# Patient Record
Sex: Female | Born: 1970
Health system: Southern US, Community
[De-identification: ages and names within clinical notes are randomized; demographics above are authoritative.]

## PROBLEM LIST (undated history)

## (undated) DIAGNOSIS — N182 Chronic kidney disease, stage 2 (mild): Secondary | ICD-10-CM

## (undated) DIAGNOSIS — I2699 Other pulmonary embolism without acute cor pulmonale: Secondary | ICD-10-CM

## (undated) DIAGNOSIS — M549 Dorsalgia, unspecified: Secondary | ICD-10-CM

## (undated) DIAGNOSIS — G8929 Other chronic pain: Secondary | ICD-10-CM

## (undated) DIAGNOSIS — M48061 Spinal stenosis, lumbar region without neurogenic claudication: Secondary | ICD-10-CM

## (undated) DIAGNOSIS — I82409 Acute embolism and thrombosis of unspecified deep veins of unspecified lower extremity: Secondary | ICD-10-CM

## (undated) DIAGNOSIS — I10 Essential (primary) hypertension: Secondary | ICD-10-CM

## (undated) HISTORY — PX: CARPAL TUNNEL RELEASE: SHX101

## (undated) HISTORY — PX: ABDOMINAL SURGERY: SHX537

---

## 2016-01-04 ENCOUNTER — Encounter (HOSPITAL_BASED_OUTPATIENT_CLINIC_OR_DEPARTMENT_OTHER): Payer: Self-pay | Admitting: Emergency Medicine

## 2016-01-04 ENCOUNTER — Inpatient Hospital Stay (HOSPITAL_BASED_OUTPATIENT_CLINIC_OR_DEPARTMENT_OTHER)
Admission: EM | Admit: 2016-01-04 | Discharge: 2016-01-12 | DRG: 460 | Disposition: A | Discharge status: Rehabilitation Facility | Payer: 59 | Attending: Internal Medicine | Admitting: Internal Medicine

## 2016-01-04 DIAGNOSIS — Z6841 Body Mass Index (BMI) 40.0 and over, adult: Secondary | ICD-10-CM

## 2016-01-04 DIAGNOSIS — M5416 Radiculopathy, lumbar region: Secondary | ICD-10-CM | POA: Diagnosis present

## 2016-01-04 DIAGNOSIS — M7138 Other bursal cyst, other site: Secondary | ICD-10-CM | POA: Diagnosis present

## 2016-01-04 DIAGNOSIS — M4806 Spinal stenosis, lumbar region: Secondary | ICD-10-CM | POA: Diagnosis not present

## 2016-01-04 DIAGNOSIS — M545 Low back pain, unspecified: Secondary | ICD-10-CM | POA: Insufficient documentation

## 2016-01-04 DIAGNOSIS — M5442 Lumbago with sciatica, left side: Secondary | ICD-10-CM

## 2016-01-04 DIAGNOSIS — Z419 Encounter for procedure for purposes other than remedying health state, unspecified: Secondary | ICD-10-CM

## 2016-01-04 DIAGNOSIS — I10 Essential (primary) hypertension: Secondary | ICD-10-CM

## 2016-01-04 DIAGNOSIS — M549 Dorsalgia, unspecified: Secondary | ICD-10-CM | POA: Diagnosis present

## 2016-01-04 DIAGNOSIS — M5441 Lumbago with sciatica, right side: Secondary | ICD-10-CM

## 2016-01-04 DIAGNOSIS — G8918 Other acute postprocedural pain: Secondary | ICD-10-CM | POA: Insufficient documentation

## 2016-01-04 DIAGNOSIS — M48061 Spinal stenosis, lumbar region without neurogenic claudication: Secondary | ICD-10-CM | POA: Insufficient documentation

## 2016-01-04 DIAGNOSIS — N182 Chronic kidney disease, stage 2 (mild): Secondary | ICD-10-CM | POA: Diagnosis present

## 2016-01-04 DIAGNOSIS — K5903 Drug induced constipation: Secondary | ICD-10-CM | POA: Insufficient documentation

## 2016-01-04 DIAGNOSIS — R739 Hyperglycemia, unspecified: Secondary | ICD-10-CM | POA: Diagnosis present

## 2016-01-04 DIAGNOSIS — N179 Acute kidney failure, unspecified: Secondary | ICD-10-CM | POA: Diagnosis not present

## 2016-01-04 DIAGNOSIS — M21371 Foot drop, right foot: Secondary | ICD-10-CM | POA: Diagnosis present

## 2016-01-04 DIAGNOSIS — G8929 Other chronic pain: Secondary | ICD-10-CM | POA: Diagnosis present

## 2016-01-04 DIAGNOSIS — D649 Anemia, unspecified: Secondary | ICD-10-CM | POA: Insufficient documentation

## 2016-01-04 DIAGNOSIS — Z9181 History of falling: Secondary | ICD-10-CM

## 2016-01-04 DIAGNOSIS — Z79899 Other long term (current) drug therapy: Secondary | ICD-10-CM

## 2016-01-04 DIAGNOSIS — N289 Disorder of kidney and ureter, unspecified: Secondary | ICD-10-CM

## 2016-01-04 DIAGNOSIS — M79606 Pain in leg, unspecified: Secondary | ICD-10-CM

## 2016-01-04 DIAGNOSIS — I129 Hypertensive chronic kidney disease with stage 1 through stage 4 chronic kidney disease, or unspecified chronic kidney disease: Secondary | ICD-10-CM | POA: Diagnosis present

## 2016-01-04 DIAGNOSIS — R29898 Other symptoms and signs involving the musculoskeletal system: Secondary | ICD-10-CM

## 2016-01-04 DIAGNOSIS — N390 Urinary tract infection, site not specified: Secondary | ICD-10-CM

## 2016-01-04 DIAGNOSIS — Z981 Arthrodesis status: Secondary | ICD-10-CM | POA: Insufficient documentation

## 2016-01-04 HISTORY — DX: Essential (primary) hypertension: I10

## 2016-01-04 HISTORY — DX: Other chronic pain: G89.29

## 2016-01-04 HISTORY — DX: Dorsalgia, unspecified: M54.9

## 2016-01-04 LAB — CBC WITH DIFFERENTIAL/PLATELET
BASOS PCT: 0 %
Basophils Absolute: 0 10*3/uL (ref 0.0–0.1)
EOS PCT: 1 %
Eosinophils Absolute: 0.1 10*3/uL (ref 0.0–0.7)
HEMATOCRIT: 38.2 % (ref 36.0–46.0)
Hemoglobin: 12.9 g/dL (ref 12.0–15.0)
LYMPHS ABS: 0.9 10*3/uL (ref 0.7–4.0)
Lymphocytes Relative: 6 %
MCH: 30.4 pg (ref 26.0–34.0)
MCHC: 33.8 g/dL (ref 30.0–36.0)
MCV: 89.9 fL (ref 78.0–100.0)
MONOS PCT: 7 %
Monocytes Absolute: 1 10*3/uL (ref 0.1–1.0)
NEUTROS ABS: 12.3 10*3/uL — AB (ref 1.7–7.7)
Neutrophils Relative %: 86 %
Platelets: 155 10*3/uL (ref 150–400)
RBC: 4.25 MIL/uL (ref 3.87–5.11)
RDW: 13.6 % (ref 11.5–15.5)
WBC: 14.3 10*3/uL — AB (ref 4.0–10.5)

## 2016-01-04 LAB — BASIC METABOLIC PANEL
Anion gap: 9 (ref 5–15)
BUN: 18 mg/dL (ref 6–20)
CHLORIDE: 106 mmol/L (ref 101–111)
CO2: 24 mmol/L (ref 22–32)
Calcium: 8.7 mg/dL — ABNORMAL LOW (ref 8.9–10.3)
Creatinine, Ser: 1.17 mg/dL — ABNORMAL HIGH (ref 0.44–1.00)
GFR calc Af Amer: >60 mL/min (ref >60)
GFR calc non Af Amer: 56 mL/min — ABNORMAL LOW (ref >60)
GLUCOSE: 104 mg/dL — AB (ref 65–99)
POTASSIUM: 4.8 mmol/L (ref 3.5–5.1)
Sodium: 139 mmol/L (ref 135–145)

## 2016-01-04 LAB — PREGNANCY, URINE: Preg Test, Ur: NEGATIVE

## 2016-01-04 MED ORDER — HYDROMORPHONE HCL 1 MG/ML IJ SOLN
1.0000 mg | Freq: Once | INTRAMUSCULAR | Status: AC
  Administered 2016-01-04: 1 mg via INTRAVENOUS
  Filled 2016-01-04: qty 1

## 2016-01-04 NOTE — ED Notes (Signed)
PTAR here for pt, pt denies questions or needs.

## 2016-01-04 NOTE — ED Notes (Signed)
Patient reports that she is having lower back pain that is chronic. Patient reports that sje feels like she cant do anything including ADL's

## 2016-01-04 NOTE — ED Notes (Signed)
Pt up to b/r by w/c, family and EMT present.

## 2016-01-04 NOTE — ED Notes (Signed)
C/o chronic back pain, pain has moved into bilateral buttocks equally, mentions cyst on spine, has appt with [facility] nsurg/neuro for injection in July, has finished prednisone, still taking neurontin, flexeril and norco w/o relief, also reports weakness and numbness. Sx ongoing for months. Walks with walker. No unilateral defecits, BLE generally weak, pedal edema noted.

## 2016-01-04 NOTE — ED Provider Notes (Signed)
CSN: [id number]     Arrival date & time 01/04/16  2034 History  By signing my name below, I, Reola Mosher, attest that this documentation has been prepared under the direction and in the presence of Sherwood Gambler, MD.  Electronically Signed: Reola Mosher, ED Scribe. 01/04/2016. 10:36 PM.   Chief Complaint  Patient presents with  . Back Pain   The history is provided by the patient and the spouse. No language interpreter was used.   HPI Comments: Caitlin Taylor is a 45 y.o. female who presents to the Emergency Department complaining of gradual onset, gradually worsening, acute on chronic, constant back pain onset 1 month PTA. Her pain has significantly worsened in the past four days. Pt reports that she had an MRI performed on her back at [facility] where they noted she had a synovial cyst on her spine. Pt reports that she has an appointment with [facility] Neurosurgery in one month to receive an injection for her cyst. She has recently finished courses of Prednisone and Hydrocodone in the past day. She also has been taking Gabapentin. None of her prescribed medications are alleviating her pain. She reports that her pain is worsened with movement and ambulation. Pt also reports that she woke up two days ago and noticed weakness in her right foot that has gradual progressed up her leg and into her left foot. She also states that she has been feeling slight numbness in her right foot and it has also gradually moved into her right leg. She states that she has been dragging both of her legs behind her with ambulation. Her husband notes that she has been falling more recently because of her weakness. Pt typically ambulates with the assistance of a walker. She denies fever.   Past Medical History  Diagnosis Date  . Chronic back pain   . Hypertension    History reviewed. No pertinent past surgical history. History reviewed. No pertinent family history. Social History   Substance Use Topics  . Smoking status: Never Smoker   . Smokeless tobacco: None  . Alcohol Use: No   OB History    No data available     Review of Systems  Constitutional: Negative for fever.  Musculoskeletal: Positive for back pain and gait problem.  Neurological: Positive for weakness and numbness.  All other systems reviewed and are negative.  Allergies  Review of patient's allergies indicates no known allergies.  Home Medications   Prior to Admission medications   Medication Sig Start Date End Date Taking? Authorizing Provider  furosemide (LASIX) 20 MG tablet Take 80 mg by mouth.   Yes Historical Provider, MD  lisinopril-hydrochlorothiazide (PRINZIDE,ZESTORETIC) 20-25 MG tablet Take 1 tablet by mouth daily.   Yes Historical Provider, MD   BP 188/110 mmHg  Pulse 118  Temp(Src) 99 F (37.2 C) (Oral)  Resp 20  Ht 5\' 3"  (1.6 m)  Wt 267 lb (121.11 kg)  BMI 47.31 kg/m2  SpO2 97%  LMP 12/28/2015   Physical Exam  Constitutional: She is oriented to person, place, and time. She appears well-developed and well-nourished.  HENT:  Head: Normocephalic and atraumatic.  Right Ear: External ear normal.  Left Ear: External ear normal.  Nose: Nose normal.  Eyes: Right eye exhibits no discharge. Left eye exhibits no discharge.  Cardiovascular: Normal rate, regular rhythm and normal heart sounds.   Pulmonary/Chest: Effort normal and breath sounds normal.  Abdominal: Soft. There is no tenderness. There is no CVA tenderness.  Genitourinary: Rectal exam shows anal tone normal.  Normal rectal tone and sensation. No gross blood  Musculoskeletal: She exhibits edema (BLE with pitting edema, patient states is chronic).       Lumbar back: She exhibits tenderness (diffuse).  Neurological: She is alert and oriented to person, place, and time.  5/5 strength with straight leg raise bilaterally. Decreased dorsiflexion of both feet, right greater than left. Subjective decreased sensation to  right lower extremity. Pt drags right leg with ambulation.   Skin: Skin is warm and dry.  Nursing note and vitals reviewed.  ED Course  Procedures (including critical care time)  DIAGNOSTIC STUDIES: Oxygen Saturation is 97% on RA, normal by my interpretation.   COORDINATION OF CARE: 10:27 PM-Discussed next steps with pt including pain medications, and transfer to [facility] for MRI Lumbar Spine. Pt verbalized understanding and is agreeable with the plan.   Labs Review Labs Reviewed - No data to display  Imaging Review MRI Lumbar Spine w/o Contrast 12/26/15 IMPRESSION: Severe bilateral facet arthritis at L4-5 with facet joint synovial cyst on the right and left with mass effects on the thecal sac which could affect the nerves within the thecal sac.   Electronically Signed By: Lorriane Shire M.D. On: 12/26/2015 15:15    EKG Interpretation None      MDM   Final diagnoses:  Bilateral low back pain with sciatica, sciatica laterality unspecified  Right leg weakness    Patient has worsening back pain with more more trouble walking. I observed patient tried to ambulate and she can only swing out her right leg due to weakness. She also has a foot drop with subjective decreased sensation in her right lower extremity. Normal rectal tone and no reports of incontinence but I'm concerned for worsening lumbar disease. I think she needs an MRI tonight. We do not have this at this facility. MRI from 10 days ago shows cyst with impingement on her thecal sac. Discussed with EDP at [facility], Dr. Sabra Heck, who accepts in transfer. Patient to be given pain control here. Will evaluate post void residual to see if she is retaining urine. Patient states she does not want to be transferred to [facility] and prefers [place].  I personally performed the services described in this documentation, which was scribed in my presence. The recorded information has been reviewed and is accurate.    Sherwood Gambler, MD 01/04/16 [phone]

## 2016-01-05 ENCOUNTER — Other Ambulatory Visit: Payer: Self-pay

## 2016-01-05 ENCOUNTER — Encounter (HOSPITAL_COMMUNITY): Payer: Self-pay | Admitting: Internal Medicine

## 2016-01-05 ENCOUNTER — Observation Stay (HOSPITAL_COMMUNITY): Payer: 59

## 2016-01-05 ENCOUNTER — Emergency Department (HOSPITAL_COMMUNITY): Payer: 59

## 2016-01-05 DIAGNOSIS — N390 Urinary tract infection, site not specified: Secondary | ICD-10-CM | POA: Diagnosis not present

## 2016-01-05 DIAGNOSIS — M545 Low back pain: Secondary | ICD-10-CM | POA: Diagnosis not present

## 2016-01-05 DIAGNOSIS — N289 Disorder of kidney and ureter, unspecified: Secondary | ICD-10-CM | POA: Diagnosis not present

## 2016-01-05 DIAGNOSIS — R739 Hyperglycemia, unspecified: Secondary | ICD-10-CM

## 2016-01-05 DIAGNOSIS — M5416 Radiculopathy, lumbar region: Secondary | ICD-10-CM

## 2016-01-05 DIAGNOSIS — I10 Essential (primary) hypertension: Secondary | ICD-10-CM | POA: Diagnosis not present

## 2016-01-05 DIAGNOSIS — N182 Chronic kidney disease, stage 2 (mild): Secondary | ICD-10-CM

## 2016-01-05 DIAGNOSIS — M549 Dorsalgia, unspecified: Secondary | ICD-10-CM | POA: Diagnosis present

## 2016-01-05 DIAGNOSIS — R319 Hematuria, unspecified: Secondary | ICD-10-CM

## 2016-01-05 LAB — MRSA PCR SCREENING: MRSA by PCR: NEGATIVE

## 2016-01-05 LAB — TYPE AND SCREEN
ABO/RH(D): O POS
Antibody Screen: NEGATIVE

## 2016-01-05 LAB — CBC
HCT: 36.3 % (ref 36.0–46.0)
HEMOGLOBIN: 11.7 g/dL — AB (ref 12.0–15.0)
MCH: 29 pg (ref 26.0–34.0)
MCHC: 32.2 g/dL (ref 30.0–36.0)
MCV: 90.1 fL (ref 78.0–100.0)
PLATELETS: 131 10*3/uL — AB (ref 150–400)
RBC: 4.03 MIL/uL (ref 3.87–5.11)
RDW: 13.8 % (ref 11.5–15.5)
WBC: 11.3 10*3/uL — ABNORMAL HIGH (ref 4.0–10.5)

## 2016-01-05 LAB — COMPREHENSIVE METABOLIC PANEL
ALT: 59 U/L — AB (ref 14–54)
ANION GAP: 4 — AB (ref 5–15)
AST: 26 U/L (ref 15–41)
Albumin: 3.5 g/dL (ref 3.5–5.0)
Alkaline Phosphatase: 50 U/L (ref 38–126)
BUN: 13 mg/dL (ref 6–20)
CALCIUM: 8.6 mg/dL — AB (ref 8.9–10.3)
CHLORIDE: 108 mmol/L (ref 101–111)
CO2: 27 mmol/L (ref 22–32)
CREATININE: 1.27 mg/dL — AB (ref 0.44–1.00)
GFR, EST AFRICAN AMERICAN: 59 mL/min — AB (ref >60)
GFR, EST NON AFRICAN AMERICAN: 51 mL/min — AB (ref >60)
Glucose, Bld: 124 mg/dL — ABNORMAL HIGH (ref 65–99)
POTASSIUM: 4.6 mmol/L (ref 3.5–5.1)
Sodium: 139 mmol/L (ref 135–145)
TOTAL PROTEIN: 6.3 g/dL — AB (ref 6.5–8.1)
Total Bilirubin: 0.8 mg/dL (ref 0.3–1.2)

## 2016-01-05 LAB — ABO/RH: ABO/RH(D): O POS

## 2016-01-05 MED ORDER — GABAPENTIN 300 MG PO CAPS
300.0000 mg | ORAL_CAPSULE | Freq: Every day | ORAL | Status: DC
  Administered 2016-01-05: 300 mg via ORAL
  Filled 2016-01-05: qty 1

## 2016-01-05 MED ORDER — ACETAMINOPHEN 325 MG PO TABS: 650.0000 mg | ORAL_TABLET | Freq: Four times a day (QID) | ORAL | Status: DC | PRN

## 2016-01-05 MED ORDER — ENOXAPARIN SODIUM 60 MG/0.6ML ~~LOC~~ SOLN
0.5000 mg/kg | SUBCUTANEOUS | Status: DC
  Administered 2016-01-05: 60 mg via SUBCUTANEOUS
  Filled 2016-01-05: qty 0.6

## 2016-01-05 MED ORDER — ENOXAPARIN SODIUM 30 MG/0.3ML ~~LOC~~ SOLN: 30.0000 mg | SUBCUTANEOUS | Status: DC

## 2016-01-05 MED ORDER — ONDANSETRON HCL 4 MG/2ML IJ SOLN: 4.0000 mg | Freq: Four times a day (QID) | INTRAMUSCULAR | Status: DC | PRN

## 2016-01-05 MED ORDER — HYDROMORPHONE HCL 1 MG/ML IJ SOLN: 1.0000 mg | INTRAMUSCULAR | Status: DC | PRN

## 2016-01-05 MED ORDER — SODIUM CHLORIDE 0.9 % IV SOLN
INTRAVENOUS | Status: DC
  Administered 2016-01-05: 06:00:00 via INTRAVENOUS

## 2016-01-05 MED ORDER — LISINOPRIL 20 MG PO TABS
20.0000 mg | ORAL_TABLET | Freq: Every day | ORAL | Status: DC
  Administered 2016-01-05 – 2016-01-07 (×3): 20 mg via ORAL
  Filled 2016-01-05 (×3): qty 1

## 2016-01-05 MED ORDER — HYDROMORPHONE HCL 1 MG/ML IJ SOLN
1.0000 mg | Freq: Once | INTRAMUSCULAR | Status: AC
  Administered 2016-01-05: 1 mg via INTRAVENOUS
  Filled 2016-01-05: qty 1

## 2016-01-05 MED ORDER — SODIUM CHLORIDE 0.9% FLUSH
3.0000 mL | Freq: Two times a day (BID) | INTRAVENOUS | Status: DC
  Administered 2016-01-05 – 2016-01-11 (×8): 3 mL via INTRAVENOUS

## 2016-01-05 MED ORDER — ENOXAPARIN SODIUM 60 MG/0.6ML ~~LOC~~ SOLN: 0.5000 mg/kg | SUBCUTANEOUS | Status: DC

## 2016-01-05 MED ORDER — ACETAMINOPHEN 650 MG RE SUPP: 650.0000 mg | Freq: Four times a day (QID) | RECTAL | Status: DC | PRN

## 2016-01-05 MED ORDER — LISINOPRIL-HYDROCHLOROTHIAZIDE 20-25 MG PO TABS: 1.0000 | ORAL_TABLET | Freq: Every day | ORAL | Status: DC

## 2016-01-05 MED ORDER — POTASSIUM CHLORIDE CRYS ER 20 MEQ PO TBCR: 20.0000 meq | EXTENDED_RELEASE_TABLET | Freq: Two times a day (BID) | ORAL | Status: DC

## 2016-01-05 MED ORDER — HYDROCHLOROTHIAZIDE 25 MG PO TABS
25.0000 mg | ORAL_TABLET | Freq: Every day | ORAL | Status: DC
  Administered 2016-01-05 – 2016-01-07 (×3): 25 mg via ORAL
  Filled 2016-01-05 (×3): qty 1

## 2016-01-05 MED ORDER — FUROSEMIDE 80 MG PO TABS
80.0000 mg | ORAL_TABLET | Freq: Every day | ORAL | Status: DC
  Administered 2016-01-05 – 2016-01-12 (×8): 80 mg via ORAL
  Filled 2016-01-05 (×8): qty 1

## 2016-01-05 MED ORDER — CYCLOBENZAPRINE HCL 10 MG PO TABS
10.0000 mg | ORAL_TABLET | Freq: Two times a day (BID) | ORAL | Status: DC
Start: 2016-01-05 — End: 2016-01-06
  Administered 2016-01-05 – 2016-01-06 (×3): 10 mg via ORAL
  Filled 2016-01-05 (×3): qty 1

## 2016-01-05 MED ORDER — DEXTROSE 5 % IV SOLN
3.0000 g | INTRAVENOUS | Status: AC
  Administered 2016-01-06: 3 g via INTRAVENOUS

## 2016-01-05 MED ORDER — NITROFURANTOIN MACROCRYSTAL 100 MG PO CAPS
100.0000 mg | ORAL_CAPSULE | Freq: Two times a day (BID) | ORAL | Status: DC
  Administered 2016-01-05 – 2016-01-11 (×12): 100 mg via ORAL
  Filled 2016-01-05 (×13): qty 1

## 2016-01-05 MED ORDER — DIPHENHYDRAMINE HCL 25 MG PO CAPS: 25.0000 mg | ORAL_CAPSULE | Freq: Four times a day (QID) | ORAL | Status: DC | PRN

## 2016-01-05 NOTE — ED Notes (Signed)
Admitting MD at bedside.

## 2016-01-05 NOTE — H&P (Signed)
TRH H&P   Patient Demographics:    Caitlin Taylor, is a 45 y.o. female  MRN: [id number]   DOB - [birth year]  Admit Date - 01/04/2016  Outpatient Primary MD for the patient is Beane, Mirian Mo, PA  Referring MD/NP/PA: Wyona Almas  Outpatient Specialists:   Patient coming from: home  Chief Complaint  Patient presents with  . Back Pain      HPI:    Caitlin Taylor  is a 45 y.o. female, w/ hx of chronic back pain (for 2 yrs),  C/o increase in back ain since February. Pt states pain was worse last night and therefore presented to ED. Pt denies radiation of pain into her legs.  + right foot weakness and right leg weakness and now having left leg weakness. + numbness, in bilateral legs, starting 12/26/2015   Denies tingling, fever, chills, wt loss, incontinence.  Pt presented to ED for evaluation  In ED,  MRI showed L facet synovial cyst.  Pt has tried prednisone, muscle relaxer, norco without relief.  ED contacted neurosurgery who requested medicine to admit for evaluation of back pain.      Review of systems:    In addition to the HPI above,  No Fever-chills, No Headache, No changes with Vision or hearing, No problems swallowing food or Liquids, No Chest pain, Cough or Shortness of Breath, No Abdominal pain, No Nausea or Vommitting, Bowel movements are regular, No Blood in stool or Urine, No dysuria, No new skin rashes or bruises, No new joints pains-aches,  No recent weight gain or loss, No polyuria, polydypsia or polyphagia, No significant Mental Stressors.  A full 10 point Review of Systems was done, except as stated above, all other Review of Systems were negative.   With Past History of the following :    Past Medical History  Diagnosis Date  . Chronic back pain   . Hypertension       Past Surgical History  Procedure Laterality Date  . Cesarean section         Social History:     Social History  Substance Use Topics  . Smoking status: Never Smoker   . Smokeless tobacco: Not on file  . Alcohol Use: No     Lives - at home  Mobility - able to walk just barely per pt     Family History :     Family History  Problem Relation Age of Onset  . Congestive Heart Failure Father       Home Medications:   Prior to Admission medications   Medication Sig Start Date End Date Taking? Authorizing Provider  Cyanocobalamin (VITAMIN B-12 PO) Take 1 tablet by mouth daily.   Yes Historical Provider, MD  cyclobenzaprine (FLEXERIL) 10 MG tablet Take 10 mg by mouth 2 (two) times daily.   Yes Historical Provider, MD  furosemide (LASIX) 20 MG tablet  Take 80 mg by mouth daily.    Yes Historical Provider, MD  gabapentin (NEURONTIN) 300 MG capsule Take 300 mg by mouth at bedtime.   Yes Historical Provider, MD  lisinopril-hydrochlorothiazide (PRINZIDE,ZESTORETIC) 20-25 MG tablet Take 1 tablet by mouth daily.   Yes Historical Provider, MD  nitrofurantoin (MACRODANTIN) 100 MG capsule Take 100 mg by mouth 2 (two) times daily.    Yes Historical Provider, MD  potassium chloride SA (K-DUR,KLOR-CON) 20 MEQ tablet Take 20 mEq by mouth 2 (two) times daily.   Yes Historical Provider, MD     Allergies:    No Known Allergies   Physical Exam:   Vitals  Blood pressure 131/87, pulse 92, temperature 98.8 F (37.1 C), temperature source Oral, resp. rate 16, height 5\' 3"  (1.6 m), weight 121.11 kg (267 lb), last menstrual period 12/28/2015, SpO2 95 %.   1. General   lying in bed in NAD,   2. Normal affect and insight, Not Suicidal or Homicidal, Awake Alert, Oriented X 3.  3. No F.N deficits, ALL C.Nerves Intact, Strength 5/5 all 4 extremities, Sensation intact all 4 extremities, Plantars down going.  4. Ears and Eyes appear Normal, Conjunctivae clear, PERRLA. Moist Oral Mucosa.  5. Supple Neck, No JVD, No cervical lymphadenopathy appriciated, No Carotid  Bruits.  6. Symmetrical Chest wall movement, Good air movement bilaterally, CTAB.  7. RRR, No Gallops, Rubs or Murmurs, No Parasternal Heave.  8. Positive Bowel Sounds, Abdomen Soft, No tenderness, No organomegaly appriciated,No rebound -guarding or rigidity.  9.  No Cyanosis, Normal Skin Turgor, No Skin Rash or Bruise.  10. Good muscle tone,  joints appear normal , no effusions, Normal ROM.  11. No Palpable Lymph Nodes in Neck or Axillae  SLR equivocal on the left   Data Review:    CBC  Recent Labs Lab 01/04/16 2304  WBC 14.3*  HGB 12.9  HCT 38.2  PLT 155  MCV 89.9  MCH 30.4  MCHC 33.8  RDW 13.6  LYMPHSABS 0.9  MONOABS 1.0  EOSABS 0.1  BASOSABS 0.0   ------------------------------------------------------------------------------------------------------------------  Chemistries   Recent Labs Lab 01/04/16 2304  NA 139  K 4.8  CL 106  CO2 24  GLUCOSE 104*  BUN 18  CREATININE 1.17*  CALCIUM 8.7*   ------------------------------------------------------------------------------------------------------------------ estimated creatinine clearance is 77.4 mL/min (by C-G formula based on Cr of 1.17). ------------------------------------------------------------------------------------------------------------------ No results for input(s): TSH, T4TOTAL, T3FREE, THYROIDAB in the last 72 hours.  Invalid input(s): FREET3  Coagulation profile No results for input(s): INR, PROTIME in the last 168 hours. ------------------------------------------------------------------------------------------------------------------- No results for input(s): DDIMER in the last 72 hours. -------------------------------------------------------------------------------------------------------------------  Cardiac Enzymes No results for input(s): CKMB, TROPONINI, MYOGLOBIN in the last 168 hours.  Invalid input(s):  CK ------------------------------------------------------------------------------------------------------------------ No results found for: BNP   ---------------------------------------------------------------------------------------------------------------  Urinalysis No results found for: COLORURINE, APPEARANCEUR, [place], [place], [place], [place], [place], [place], PROTEINUR, UROBILINOGEN, NITRITE, LEUKOCYTESUR  ----------------------------------------------------------------------------------------------------------------   Imaging Results:    Mr Lumbar Spine Wo Contrast  01/05/2016  CLINICAL DATA:  Gradually worsening, acute on chronic constant back pain for 1 month. Pain worsening for 4 days. Reported synovial cyst of the spine. Numbness RIGHT leg. Recent falls. EXAM: MRI LUMBAR SPINE WITHOUT CONTRAST TECHNIQUE: Multiplanar, multisequence MR imaging of the lumbar spine was performed. No intravenous contrast was administered. COMPARISON:  MRI of the lumbar spine December 26, 2015 FINDINGS: Large body habitus results in decreased signal to noise ratio. OSSEOUS STRUCTURES: Lumbar vertebral bodies are intact and aligned with maintenance of lumbar  lordosis. Intervertebral discs demonstrate normal morphology and signal characteristics. No abnormal bone marrow signal. SPINAL CORD: Conus medullaris terminates at L1-2 and demonstrates normal morphology and signal characteristics. Cauda equina is normal. SOFT TISSUES: Included prevertebral and paraspinal soft tissues are normal. LEVEL BY LEVEL EVALUATION: L1-2 through L3-4:: No disc bulge, canal stenosis nor neural foraminal narrowing. L4-5: Severe facet arthropathy with increasing bilateral facet effusions, measuring up to 3 mm on the LEFT. 7 x 7 x 7 mm LEFT facet synovial cyst extending into the dorsal epidural space resulting in mild thecal sac effacement and canal stenosis. No neural foraminal narrowing. L5-S1: No disc bulge, canal stenosis nor  neural foraminal narrowing. IMPRESSION: Severe L4-5 facet arthropathy with increasing facet effusions which are likely reactive. Mild L4-5 facet widening can be seen with dynamic instability. Similar 7 x 7 x 7 mm LEFT facet synovial cyst within dorsal epidural space resulting in L4-5 mild canal stenosis. No acute fracture or malalignment. Electronically Signed   By: Elon Alas M.D.   On: 01/05/2016 03:26       Assessment & Plan:    Active Problems:   Back pain   Renal insufficiency   Hyperglycemia    1. Back pain Dilaudid 1mg  iv q4h prn Cont flexeril NPO except for medications Neurosurgery consulted by ED  2 Hypertension Cont current medications  3. Renal insufficiency Check cmp If not improving with ns iv then d/c lisinopril/hydrochlorothiazide  4. Hyperglycemia Check hga1c   DVT Prophylaxis Lovenox - SCDs  AM Labs Ordered, also please review Full Orders  Family Communication: Admission, patients condition and plan of care including tests being ordered have been discussed with the patient  who indicate understanding and agree with the plan and Code Status.  Code Status FULL CODE  Likely DC to    Condition GUARDED    Consults called:  neurosurgery  Admission status: inpatient  Time spent in minutes :45 minutes   Jani Gravel M.D on 01/05/2016 at 4:20 AM  Between 7am to 7pm - Pager - [phone]. After 7pm go to [email or web address] - password [facility] [address]  [facility] - Office  [phone]

## 2016-01-05 NOTE — Consult Note (Signed)
CC:  Chief Complaint  Patient presents with  . Back Pain    HPI: Caitlin Taylor is a 45 y.o. female with chronic low back pain.  She has had one week of lower extremity numbness and distal lower extremity weakness.  She was found to have bilateral L4-5 facet arthropathy with compressive synovial cysts.  PMH: Past Medical History  Diagnosis Date  . Chronic back pain   . Hypertension     PSH: Past Surgical History  Procedure Laterality Date  . Cesarean section      SH: Social History  Substance Use Topics  . Smoking status: Never Smoker   . Smokeless tobacco: None  . Alcohol Use: No    MEDS: Prior to Admission medications   Medication Sig Start Date End Date Taking? Authorizing Provider  Cyanocobalamin (VITAMIN B-12 PO) Take 1 tablet by mouth daily.   Yes Historical Provider, MD  cyclobenzaprine (FLEXERIL) 10 MG tablet Take 10 mg by mouth 2 (two) times daily.   Yes Historical Provider, MD  furosemide (LASIX) 20 MG tablet Take 80 mg by mouth daily.    Yes Historical Provider, MD  gabapentin (NEURONTIN) 300 MG capsule Take 300 mg by mouth at bedtime.   Yes Historical Provider, MD  lisinopril-hydrochlorothiazide (PRINZIDE,ZESTORETIC) 20-25 MG tablet Take 1 tablet by mouth daily.   Yes Historical Provider, MD  nitrofurantoin (MACRODANTIN) 100 MG capsule Take 100 mg by mouth 2 (two) times daily.    Yes Historical Provider, MD  potassium chloride SA (K-DUR,KLOR-CON) 20 MEQ tablet Take 20 mEq by mouth 2 (two) times daily.   Yes Historical Provider, MD    ALLERGY: No Known Allergies  ROS: ROS  NEUROLOGIC EXAM: Awake, alert, oriented Memory and concentration grossly intact Speech fluent, appropriate CN grossly intact Motor exam: Upper Extremities Deltoid Bicep Tricep Grip  Right 5/5 5/5 5/5 5/5  Left 5/5 5/5 5/5 5/5   Lower Extremity IP Quad PF DF EHL  Right 5/5 5/5 1/5 1/5 1/5  Left 5/5 5/5 1/5 1/5 1/5   Sensation grossly intact to LT  IMAGING: Normal  alignment and lordosis.  Bilateral facet arthropathy with widening at L4-5.  Bilateral synovial cysts cause severe stenosis.  IMPRESSION: - 45 y.o. female with subacute lumbar radiculopathy due to synovial cysts.  Neurological deficits as above.  PLAN: - L4-5 PLIF tomorrow. - I had a long discussion with the patient and explained the risks and benefits of surgery and the alternatives to surgery.  She understands, asked appropriate questions, and wishes to proceed.

## 2016-01-05 NOTE — Care Management Note (Signed)
Case Management Note  Patient Details  Name: MARIETOU HARPSTER MRN: [id number] Date of Birth: May 04, 1971  Subjective/Objective:                 Patient admitted with back pain form cyst to spine. Patient is NPO currently and states she is scheduled for surgery tomorrow. Shw would like to have RW and shower seat prior to DC. Independent PTA, lives with spouse in [place].   Action/Plan:  Anticipate PT eval after surgery, will wait for rec. Anticipate DME.  Expected Discharge Date:                  Expected Discharge Plan:  [place]  In-House Referral:     Discharge planning Services  CM Consult  Post Acute Care Choice:    Choice offered to:     DME Arranged:    DME Agency:     HH Arranged:    [place] Agency:     Status of Service:  In process, will continue to follow  If discussed at Long Length of Stay Meetings, dates discussed:    Additional Comments:  Carles Collet, RN 01/05/2016, 2:37 PM

## 2016-01-05 NOTE — ED Notes (Signed)
Pt in MRI.

## 2016-01-05 NOTE — ED Notes (Signed)
Report received by EMS

## 2016-01-05 NOTE — ED Provider Notes (Signed)
12:25 AM  Pt transferred from [facility].  Pt is a 45 y.o. obese female with history of chronic back pain, hypertension who presented to the emergency department with worsening acute on chronic back pain for the past month. Pain significant worse over the past 4 days. Having new numbness and weakness in both of her legs.  States numbness started in the right foot is now up to the right knee. She is also reporting numbness in the left foot as well. Strong pulses in both feet. Decreased strength with bilateral dorsiflexion worse on the right than the left. Diminished reflexes in bilateral lower extremities. Extremities are warm and well-perfused. Denies any bowel or bladder incontinence or urinary retention. Denies any fever. Was placed on prednisone and hydrocodone at [facility]. States she is almost out of prednisone. Labs show leukocytosis which is likely from her prednisone. She denies any history of cancer, epidural injections, back surgeries. Sent here for MRI of her lumbar spine.  Has an appointment to see [facility] neurosurgery in one month but has never seen a neurosurgeon before.  MRI L Spine 12/26/15 Impression: Severe bilateral facet arthritis at L4-5 with facet joint synovial cyst on the right and left with mass effects on the thecal sac which could affect the nerves within the thecal sac.    3:50 AM  MRI shows severe L4-L5 facet arthropathy with increasing facet effusions and mild L4-L5 facet widening. Her left facet synovial cyst appears stable and is causing mild canal stenosis. Discussed with Dr. Cyndy Freeze on call for neurosurgery who has reviewed patient's imaging. He states that he would like medicine to admit the patient and he will see her today to discuss possible surgical options. Does not want any further steroids at this time. We'll continue pain control with Dilaudid. We'll keep her nothing by mouth after breakfast. Discussed with Dr. Maudie Mercury on call for hospitalist service for admission  to medical, inpatient bed. I will place holding orders per his request. Patient and family have been updated with this plan. Care transferred to hospitalist service.  [place], DO 01/05/16 [phone]

## 2016-01-05 NOTE — Progress Notes (Addendum)
PROGRESS NOTE  Caitlin Taylor [id number] DOB: 06-Mar-1971 DOA: 01/04/2016 PCP: Manfred Shirts, PA  Brief History:  45 year old female with a history of impaired glucose tolerance, hypertension, and chronic back pain presented with 4 day history of increasing bilateral lower extremity weakness and numbness. The patient states that she has had back pain for the past 2 years but it has worsened since February 2017. The patient had a mechanical fall on 12/26/2015. She presented to ED at [facility]. The patient was discharged with prednisone which she finished on 01/03/2016. The patient represented to the ED at [facility] on 12/30/2015 and was diagnosed with UTI and started on nitrofurantoin. In the interim, the patient complains of increase right foot and leg weakness with numbness and tingling that has worsened to encompass her left lower extremity up to her knee. She has resorted to using a walker to ambulate. Upon presentation. MRI of the lumbar spine showed severe L4-5 facet arthropathy with increased facet effusions. There was also a left facet synovial cyst resulting in L4-5 mild canal stenosis. Neurosurgery was consulted in the emergency department.  Previous medical records reviewed and summarized.  Assessment/Plan: Lumbar radiculopathy -Appreciate neurosurgery, Dr. Cyndy Freeze -plans noted for surgery 01/06/16 -pain control -PT/OT after surgery -01/05/2016 MRI lumbar spine--severe L4-5 facet arthropathy with increased facet effusion; left facet synovial cyst causing mild canal stenosis  UTI -12/30/2015 UA at [facility]-->+pyruia -12/30/15 urine culture- EColi -continue nitrofurantoin D#2  CKD stage II -Baseline creatinine 1.0-1.3 -Serial BMP  Hyperglycemia -Hemoglobin A1c  Hypertension -Continue lisinopril HCTZ -Continue furosemide -Discontinue potassium supplementation as potassium is trending up  Lower extremity pain and edema -Lower  extremity duplex   Disposition Plan:   Home in 2-3 days  Family Communication:   Mother updated at bedside--total time 35 min; >50% spent face to face time, spent counseling and coordinating care--1050-1125  Consultants:  Neurosurgery  Code Status:  FULL   DVT Prophylaxis:  [place] Lovenox   Procedures: As Listed in Progress Note Above  Antibiotics: Nitrofurantoin 01/04/16--->    Subjective: Patient states that the weakness and numbness in her legs are about the same as the last several days. Denies any fevers, chills, chest pain, shortness breath, nausea, vomiting, diarrhea, abdominal pain. No dysuria or hematuria. No rashes. Denies any headache or neck pain.  Objective: Filed Vitals:   01/04/16 2315 01/05/16 0019 01/05/16 0200 01/05/16 0535  BP: 156/83 158/92 131/87 138/89  Pulse: 101 94 92 101  Temp: 99.1 F (37.3 C) 98.8 F (37.1 C)  98.1 F (36.7 C)  TempSrc: Oral Oral  Oral  Resp:  16  16  Height:    5' 3.6" (1.615 m)  Weight:    122.108 kg (269 lb 3.2 oz)  SpO2: 94% 95% 95% 100%    Intake/Output Summary (Last 24 hours) at 01/05/16 1110 Last data filed at 01/05/16 0920  Gross per 24 hour  Intake    100 ml  Output      0 ml  Net    100 ml   Weight change:  Exam:   General:  Pt is alert, follows commands appropriately, not in acute distress  HEENT: No icterus, No thrush, No neck mass, [place]/AT  Cardiovascular: RRR, S1/S2, no rubs, no gallops  Respiratory: CTA bilaterally, no wheezing, no crackles, no rhonchi  Abdomen: Soft/+BS, non tender, non distended, no guarding  Extremities: 2 + LE edema, No lymphangitis, No petechiae, No  rashes, no synovitis Neuro:  CN II-XII intact, strength 4-/5 in RUE, RLE, strength 4-/5 LUE, LLE; sensation intact bilateral; no dysmetria; babinski equivocal    Data Reviewed: I have personally reviewed following labs and imaging studies Basic Metabolic Panel:  Recent Labs Lab 01/04/16 2304 01/05/16 0716  NA 139 139  K 4.8  4.6  CL 106 108  CO2 24 27  GLUCOSE 104* 124*  BUN 18 13  CREATININE 1.17* 1.27*  CALCIUM 8.7* 8.6*   Liver Function Tests:  Recent Labs Lab 01/05/16 0716  AST 26  ALT 59*  ALKPHOS 50  BILITOT 0.8  PROT 6.3*  ALBUMIN 3.5   No results for input(s): LIPASE, AMYLASE in the last 168 hours. No results for input(s): AMMONIA in the last 168 hours. Coagulation Profile: No results for input(s): INR, PROTIME in the last 168 hours. CBC:  Recent Labs Lab 01/04/16 2304 01/05/16 0716  WBC 14.3* 11.3*  NEUTROABS 12.3*  --   HGB 12.9 11.7*  HCT 38.2 36.3  MCV 89.9 90.1  PLT 155 131*   Cardiac Enzymes: No results for input(s): CKTOTAL, CKMB, CKMBINDEX, TROPONINI in the last 168 hours. BNP: Invalid input(s): POCBNP CBG: No results for input(s): GLUCAP in the last 168 hours. HbA1C: No results for input(s): HGBA1C in the last 72 hours. Urine analysis: No results found for: COLORURINE, APPEARANCEUR, LABSPEC, PHURINE, GLUCOSEU, HGBUR, BILIRUBINUR, KETONESUR, PROTEINUR, UROBILINOGEN, NITRITE, LEUKOCYTESUR Sepsis Labs: @LABRCNTIP (procalcitonin:4,lacticidven:4) )No results found for this or any previous visit (from the past 240 hour(s)).   Scheduled Meds: . cyclobenzaprine  10 mg Oral BID  . enoxaparin (LOVENOX) injection  0.5 mg/kg Subcutaneous Q24H  . furosemide  80 mg Oral Daily  . gabapentin  300 mg Oral QHS  . lisinopril  20 mg Oral Daily   And  . hydrochlorothiazide  25 mg Oral Daily  . nitrofurantoin  100 mg Oral BID  . sodium chloride flush  3 mL Intravenous Q12H   Continuous Infusions: . sodium chloride 75 mL/hr at 01/05/16 0546    Procedures/Studies: Mr Lumbar Spine Wo Contrast  01/05/2016  CLINICAL DATA:  Gradually worsening, acute on chronic constant back pain for 1 month. Pain worsening for 4 days. Reported synovial cyst of the spine. Numbness RIGHT leg. Recent falls. EXAM: MRI LUMBAR SPINE WITHOUT CONTRAST TECHNIQUE: Multiplanar, multisequence MR imaging of  the lumbar spine was performed. No intravenous contrast was administered. COMPARISON:  MRI of the lumbar spine December 26, 2015 FINDINGS: Large body habitus results in decreased signal to noise ratio. OSSEOUS STRUCTURES: Lumbar vertebral bodies are intact and aligned with maintenance of lumbar lordosis. Intervertebral discs demonstrate normal morphology and signal characteristics. No abnormal bone marrow signal. SPINAL CORD: Conus medullaris terminates at L1-2 and demonstrates normal morphology and signal characteristics. Cauda equina is normal. SOFT TISSUES: Included prevertebral and paraspinal soft tissues are normal. LEVEL BY LEVEL EVALUATION: L1-2 through L3-4:: No disc bulge, canal stenosis nor neural foraminal narrowing. L4-5: Severe facet arthropathy with increasing bilateral facet effusions, measuring up to 3 mm on the LEFT. 7 x 7 x 7 mm LEFT facet synovial cyst extending into the dorsal epidural space resulting in mild thecal sac effacement and canal stenosis. No neural foraminal narrowing. L5-S1: No disc bulge, canal stenosis nor neural foraminal narrowing. IMPRESSION: Severe L4-5 facet arthropathy with increasing facet effusions which are likely reactive. Mild L4-5 facet widening can be seen with dynamic instability. Similar 7 x 7 x 7 mm LEFT facet synovial cyst within dorsal epidural space resulting in L4-5  mild canal stenosis. No acute fracture or malalignment. Electronically Signed   By: Elon Alas M.D.   On: 01/05/2016 03:26    Jeovanni Heuring, DO  [facility] Pager [phone]  If 7PM-7AM, please contact night-coverage [email or web address] Password [email or web address] 01/05/2016, 11:10 AM   LOS: 0 days

## 2016-01-06 ENCOUNTER — Encounter (HOSPITAL_COMMUNITY)
Admission: EM | Disposition: A | Discharge status: Rehabilitation Facility | Payer: Self-pay | Source: Home / Self Care | Attending: Internal Medicine

## 2016-01-06 ENCOUNTER — Observation Stay (HOSPITAL_COMMUNITY): Payer: 59

## 2016-01-06 ENCOUNTER — Encounter (HOSPITAL_COMMUNITY): Payer: Self-pay | Admitting: Certified Registered"

## 2016-01-06 ENCOUNTER — Observation Stay (HOSPITAL_BASED_OUTPATIENT_CLINIC_OR_DEPARTMENT_OTHER): Payer: 59

## 2016-01-06 ENCOUNTER — Observation Stay (HOSPITAL_COMMUNITY): Payer: 59 | Admitting: Certified Registered"

## 2016-01-06 DIAGNOSIS — N390 Urinary tract infection, site not specified: Secondary | ICD-10-CM | POA: Diagnosis not present

## 2016-01-06 DIAGNOSIS — M5416 Radiculopathy, lumbar region: Secondary | ICD-10-CM | POA: Diagnosis not present

## 2016-01-06 DIAGNOSIS — R739 Hyperglycemia, unspecified: Secondary | ICD-10-CM | POA: Diagnosis not present

## 2016-01-06 DIAGNOSIS — I10 Essential (primary) hypertension: Secondary | ICD-10-CM | POA: Diagnosis not present

## 2016-01-06 DIAGNOSIS — M79606 Pain in leg, unspecified: Secondary | ICD-10-CM

## 2016-01-06 HISTORY — PX: MAXIMUM ACCESS (MAS)POSTERIOR LUMBAR INTERBODY FUSION (PLIF) 1 LEVEL: SHX6368

## 2016-01-06 LAB — BASIC METABOLIC PANEL
ANION GAP: 9 (ref 5–15)
BUN: 15 mg/dL (ref 6–20)
CO2: 28 mmol/L (ref 22–32)
Calcium: 9.3 mg/dL (ref 8.9–10.3)
Chloride: 100 mmol/L — ABNORMAL LOW (ref 101–111)
Creatinine, Ser: 1.32 mg/dL — ABNORMAL HIGH (ref 0.44–1.00)
GFR, EST AFRICAN AMERICAN: 56 mL/min — AB (ref >60)
GFR, EST NON AFRICAN AMERICAN: 48 mL/min — AB (ref >60)
GLUCOSE: 100 mg/dL — AB (ref 65–99)
POTASSIUM: 4.6 mmol/L (ref 3.5–5.1)
SODIUM: 137 mmol/L (ref 135–145)

## 2016-01-06 LAB — SURGICAL PCR SCREEN
MRSA, PCR: NEGATIVE
STAPHYLOCOCCUS AUREUS: NEGATIVE

## 2016-01-06 SURGERY — FOR MAXIMUM ACCESS (MAS) POSTERIOR LUMBAR INTERBODY FUSION (PLIF) 1 LEVEL
Anesthesia: General | Site: Spine Lumbar

## 2016-01-06 MED ORDER — GABAPENTIN 300 MG PO CAPS: 300.0000 mg | ORAL_CAPSULE | Freq: Three times a day (TID) | ORAL | Status: DC

## 2016-01-06 MED ORDER — FENTANYL CITRATE (PF) 250 MCG/5ML IJ SOLN
INTRAMUSCULAR | Status: AC
  Filled 2016-01-06: qty 5

## 2016-01-06 MED ORDER — DOCUSATE SODIUM 100 MG PO CAPS
100.0000 mg | ORAL_CAPSULE | Freq: Two times a day (BID) | ORAL | Status: DC
  Administered 2016-01-07 – 2016-01-11 (×10): 100 mg via ORAL
  Filled 2016-01-06 (×11): qty 1

## 2016-01-06 MED ORDER — VANCOMYCIN HCL 1000 MG IV SOLR
INTRAVENOUS | Status: AC
  Filled 2016-01-06: qty 1000

## 2016-01-06 MED ORDER — ACETAMINOPHEN 500 MG PO TABS
1000.0000 mg | ORAL_TABLET | Freq: Four times a day (QID) | ORAL | Status: DC
  Administered 2016-01-07 – 2016-01-08 (×8): 1000 mg via ORAL
  Administered 2016-01-09: 500 mg via ORAL
  Administered 2016-01-09 – 2016-01-12 (×9): 1000 mg via ORAL
  Filled 2016-01-06 (×20): qty 2

## 2016-01-06 MED ORDER — LIDOCAINE HCL (CARDIAC) 20 MG/ML IV SOLN
INTRAVENOUS | Status: DC | PRN
  Administered 2016-01-06: 80 mg via INTRAVENOUS

## 2016-01-06 MED ORDER — SODIUM CHLORIDE 0.9% FLUSH
3.0000 mL | Freq: Two times a day (BID) | INTRAVENOUS | Status: DC
  Administered 2016-01-07 – 2016-01-12 (×8): 3 mL via INTRAVENOUS

## 2016-01-06 MED ORDER — OXYCODONE HCL 5 MG PO TABS
5.0000 mg | ORAL_TABLET | ORAL | Status: DC | PRN
  Administered 2016-01-10: 5 mg via ORAL
  Filled 2016-01-06 (×3): qty 1

## 2016-01-06 MED ORDER — LIDOCAINE-EPINEPHRINE 2 %-1:100000 IJ SOLN
INTRAMUSCULAR | Status: DC | PRN
  Administered 2016-01-06: 15 mL

## 2016-01-06 MED ORDER — OXYCODONE HCL 5 MG PO TABS: 5.0000 mg | ORAL_TABLET | Freq: Once | ORAL | Status: DC | PRN

## 2016-01-06 MED ORDER — PROPOFOL 10 MG/ML IV BOLUS
INTRAVENOUS | Status: AC
  Filled 2016-01-06: qty 20

## 2016-01-06 MED ORDER — BISACODYL 5 MG PO TBEC: 5.0000 mg | DELAYED_RELEASE_TABLET | Freq: Every day | ORAL | Status: DC | PRN

## 2016-01-06 MED ORDER — HYDROMORPHONE HCL 1 MG/ML IJ SOLN: 0.2500 mg | INTRAMUSCULAR | Status: DC | PRN

## 2016-01-06 MED ORDER — MIDAZOLAM HCL 2 MG/2ML IJ SOLN
INTRAMUSCULAR | Status: AC
  Filled 2016-01-06: qty 2

## 2016-01-06 MED ORDER — ENOXAPARIN SODIUM 60 MG/0.6ML ~~LOC~~ SOLN: 0.5000 mg/kg | SUBCUTANEOUS | Status: DC

## 2016-01-06 MED ORDER — OXYCODONE HCL 5 MG/5ML PO SOLN: 5.0000 mg | Freq: Once | ORAL | Status: DC | PRN

## 2016-01-06 MED ORDER — OXYCODONE HCL ER 10 MG PO T12A
20.0000 mg | EXTENDED_RELEASE_TABLET | Freq: Two times a day (BID) | ORAL | Status: DC
  Administered 2016-01-07 – 2016-01-12 (×12): 20 mg via ORAL
  Filled 2016-01-06 (×12): qty 2

## 2016-01-06 MED ORDER — PROPOFOL 500 MG/50ML IV EMUL
INTRAVENOUS | Status: DC | PRN
  Administered 2016-01-06: 21:00:00 via INTRAVENOUS
  Administered 2016-01-06: 75 ug/kg/min via INTRAVENOUS

## 2016-01-06 MED ORDER — ACETAMINOPHEN 160 MG/5ML PO SOLN: 325.0000 mg | ORAL | Status: DC | PRN

## 2016-01-06 MED ORDER — PROPOFOL 10 MG/ML IV BOLUS
INTRAVENOUS | Status: AC
Start: 2016-01-06 — End: 2016-01-06
  Filled 2016-01-06: qty 20

## 2016-01-06 MED ORDER — GABAPENTIN 300 MG PO CAPS
300.0000 mg | ORAL_CAPSULE | Freq: Three times a day (TID) | ORAL | Status: DC
  Administered 2016-01-07 – 2016-01-12 (×18): 300 mg via ORAL
  Filled 2016-01-06 (×18): qty 1

## 2016-01-06 MED ORDER — FENTANYL CITRATE (PF) 250 MCG/5ML IJ SOLN
INTRAMUSCULAR | Status: DC | PRN
  Administered 2016-01-06 (×3): 100 ug via INTRAVENOUS
  Administered 2016-01-06: 50 ug via INTRAVENOUS
  Administered 2016-01-06: 100 ug via INTRAVENOUS
  Administered 2016-01-06 (×3): 50 ug via INTRAVENOUS
  Administered 2016-01-06: 100 ug via INTRAVENOUS
  Administered 2016-01-06: 50 ug via INTRAVENOUS
  Administered 2016-01-06: 100 ug via INTRAVENOUS
  Administered 2016-01-06 (×2): 50 ug via INTRAVENOUS

## 2016-01-06 MED ORDER — HYDROMORPHONE HCL 1 MG/ML IJ SOLN
INTRAMUSCULAR | Status: AC
  Administered 2016-01-06: 0.5 mg via INTRAVENOUS
  Filled 2016-01-06: qty 1

## 2016-01-06 MED ORDER — ONDANSETRON HCL 4 MG/2ML IJ SOLN
4.0000 mg | INTRAMUSCULAR | Status: DC | PRN
  Administered 2016-01-08: 4 mg via INTRAVENOUS
  Filled 2016-01-06: qty 2

## 2016-01-06 MED ORDER — LACTATED RINGERS IV SOLN
INTRAVENOUS | Status: DC | PRN
  Administered 2016-01-06 (×5): via INTRAVENOUS

## 2016-01-06 MED ORDER — MIDAZOLAM HCL 2 MG/2ML IJ SOLN
INTRAMUSCULAR | Status: DC | PRN
  Administered 2016-01-06: 2 mg via INTRAVENOUS

## 2016-01-06 MED ORDER — BUPIVACAINE LIPOSOME 1.3 % IJ SUSP
20.0000 mL | INTRAMUSCULAR | Status: AC
  Administered 2016-01-06: 20 mL
  Filled 2016-01-06: qty 20

## 2016-01-06 MED ORDER — PROPOFOL 10 MG/ML IV BOLUS
INTRAVENOUS | Status: DC | PRN
Start: 2016-01-06 — End: 2016-01-06
  Administered 2016-01-06: 140 mg via INTRAVENOUS
  Administered 2016-01-06: 40 mg via INTRAVENOUS
  Administered 2016-01-06: 20 mg via INTRAVENOUS

## 2016-01-06 MED ORDER — FLEET ENEMA 7-19 GM/118ML RE ENEM: 1.0000 | ENEMA | Freq: Once | RECTAL | Status: DC | PRN

## 2016-01-06 MED ORDER — SODIUM CHLORIDE 0.9% FLUSH: 3.0000 mL | INTRAVENOUS | Status: DC | PRN

## 2016-01-06 MED ORDER — THROMBIN 5000 UNITS EX SOLR
OROMUCOSAL | Status: DC | PRN
  Administered 2016-01-06: 19:00:00 via TOPICAL

## 2016-01-06 MED ORDER — PROPOFOL 1000 MG/100ML IV EMUL
5.0000 ug/kg/min | Freq: Once | INTRAVENOUS | Status: DC
  Filled 2016-01-06: qty 100

## 2016-01-06 MED ORDER — 0.9 % SODIUM CHLORIDE (POUR BTL) OPTIME
TOPICAL | Status: DC | PRN
  Administered 2016-01-06: 1000 mL

## 2016-01-06 MED ORDER — METHOCARBAMOL 750 MG PO TABS
750.0000 mg | ORAL_TABLET | Freq: Four times a day (QID) | ORAL | Status: DC
  Administered 2016-01-07 – 2016-01-12 (×23): 750 mg via ORAL
  Filled 2016-01-06 (×22): qty 1

## 2016-01-06 MED ORDER — BUPIVACAINE-EPINEPHRINE 0.5% -1:200000 IJ SOLN
INTRAMUSCULAR | Status: DC | PRN
  Administered 2016-01-06: 15 mL

## 2016-01-06 MED ORDER — ONDANSETRON HCL 4 MG/2ML IJ SOLN
INTRAMUSCULAR | Status: AC
  Filled 2016-01-06: qty 2

## 2016-01-06 MED ORDER — ACETAMINOPHEN 325 MG PO TABS: 325.0000 mg | ORAL_TABLET | ORAL | Status: DC | PRN

## 2016-01-06 MED ORDER — SENNA 8.6 MG PO TABS
1.0000 | ORAL_TABLET | Freq: Two times a day (BID) | ORAL | Status: DC
  Administered 2016-01-07 – 2016-01-11 (×10): 8.6 mg via ORAL
  Filled 2016-01-06 (×11): qty 1

## 2016-01-06 MED ORDER — CELECOXIB 200 MG PO CAPS
200.0000 mg | ORAL_CAPSULE | Freq: Two times a day (BID) | ORAL | Status: DC
  Administered 2016-01-07 (×2): 200 mg via ORAL
  Filled 2016-01-06 (×2): qty 1

## 2016-01-06 MED ORDER — MENTHOL 3 MG MT LOZG: 1.0000 | LOZENGE | OROMUCOSAL | Status: DC | PRN

## 2016-01-06 MED ORDER — PHENOL 1.4 % MT LIQD
1.0000 | OROMUCOSAL | Status: DC | PRN
Start: 2016-01-06 — End: 2016-01-12

## 2016-01-06 MED ORDER — ACETAMINOPHEN 160 MG/5ML PO SOLN
325.0000 mg | ORAL | Status: DC | PRN
  Filled 2016-01-06: qty 20.3

## 2016-01-06 MED ORDER — SODIUM CHLORIDE 0.9 % IV SOLN
INTRAVENOUS | Status: DC
  Administered 2016-01-07 (×2): via INTRAVENOUS

## 2016-01-06 MED ORDER — SODIUM CHLORIDE 0.9 % IV SOLN: 250.0000 mL | INTRAVENOUS | Status: DC

## 2016-01-06 MED ORDER — SODIUM CHLORIDE 0.9 % IR SOLN
Status: DC | PRN
  Administered 2016-01-06 (×2)

## 2016-01-06 MED ORDER — VANCOMYCIN HCL 1000 MG IV SOLR
INTRAVENOUS | Status: DC | PRN
  Administered 2016-01-06 (×2): 1000 mg via TOPICAL

## 2016-01-06 MED ORDER — ZOLPIDEM TARTRATE 5 MG PO TABS
5.0000 mg | ORAL_TABLET | Freq: Every evening | ORAL | Status: DC | PRN
  Administered 2016-01-07: 5 mg via ORAL
  Filled 2016-01-06: qty 1

## 2016-01-06 MED ORDER — CEFAZOLIN IN D5W 1 GM/50ML IV SOLN
1.0000 g | Freq: Three times a day (TID) | INTRAVENOUS | Status: AC
  Administered 2016-01-07 (×2): 1 g via INTRAVENOUS
  Filled 2016-01-06 (×2): qty 50

## 2016-01-06 MED ORDER — PROPOFOL 1000 MG/100ML IV EMUL
INTRAVENOUS | Status: AC
  Filled 2016-01-06: qty 100

## 2016-01-06 MED ORDER — SUCCINYLCHOLINE CHLORIDE 20 MG/ML IJ SOLN
INTRAMUSCULAR | Status: DC | PRN
  Administered 2016-01-06: 80 mg via INTRAVENOUS

## 2016-01-06 MED ORDER — LIDOCAINE 2% (20 MG/ML) 5 ML SYRINGE
INTRAMUSCULAR | Status: AC
  Filled 2016-01-06: qty 5

## 2016-01-06 MED ORDER — PHENYLEPHRINE HCL 10 MG/ML IJ SOLN
10.0000 mg | INTRAVENOUS | Status: DC | PRN
  Administered 2016-01-06: 20 ug/min via INTRAVENOUS

## 2016-01-06 MED ORDER — THROMBIN 20000 UNITS EX SOLR
CUTANEOUS | Status: DC | PRN
  Administered 2016-01-06: 19:00:00 via TOPICAL

## 2016-01-06 MED ORDER — ONDANSETRON HCL 4 MG/2ML IJ SOLN
INTRAMUSCULAR | Status: DC | PRN
  Administered 2016-01-06: 4 mg via INTRAVENOUS

## 2016-01-06 MED ORDER — HYDROMORPHONE HCL 1 MG/ML IJ SOLN
0.2500 mg | INTRAMUSCULAR | Status: DC | PRN
  Administered 2016-01-06 (×2): 0.5 mg via INTRAVENOUS

## 2016-01-06 SURGICAL SUPPLY — 84 items
BAG DECANTER FOR FLEXI CONT (MISCELLANEOUS) ×6 IMPLANT
BENZOIN TINCTURE PRP APPL 2/3 (GAUZE/BANDAGES/DRESSINGS) ×3 IMPLANT
BIT DRILL NEURO 2X3.1 SFT TUCH (MISCELLANEOUS) ×1 IMPLANT
BIT DRILL PLIF MAS DISP 5.5MM (DRILL) ×1 IMPLANT
BLADE CLIPPER SURG (BLADE) IMPLANT
BLADE SURG 11 STRL SS (BLADE) ×3 IMPLANT
BONE MATRIX OSTEOCEL PRO MED (Bone Implant) ×3 IMPLANT
BUR MATCHSTICK NEURO 3.0 LAGG (BURR) ×3 IMPLANT
BUR ROUND FLUTED 5 RND (BURR) ×2 IMPLANT
BUR ROUND FLUTED 5MM RND (BURR) ×1
CAGE COROENT LRG MP 9X9X23 8D (Cage) ×6 IMPLANT
CANISTER SUCT 3000ML PPV (MISCELLANEOUS) ×6 IMPLANT
CHLORAPREP W/TINT 26ML (MISCELLANEOUS) ×3 IMPLANT
CLIP NEUROVISION LG (CLIP) ×3 IMPLANT
CONT SPEC 4OZ CLIKSEAL STRL BL (MISCELLANEOUS) ×6 IMPLANT
DECANTER SPIKE VIAL GLASS SM (MISCELLANEOUS) IMPLANT
DERMABOND ADHESIVE PROPEN (GAUZE/BANDAGES/DRESSINGS) ×2
DERMABOND ADVANCED (GAUZE/BANDAGES/DRESSINGS) ×2
DERMABOND ADVANCED .7 DNX12 (GAUZE/BANDAGES/DRESSINGS) ×1 IMPLANT
DERMABOND ADVANCED .7 DNX6 (GAUZE/BANDAGES/DRESSINGS) ×1 IMPLANT
DRAPE C-ARM 42X72 X-RAY (DRAPES) ×6 IMPLANT
DRAPE C-ARMOR (DRAPES) ×3 IMPLANT
DRAPE MICROSCOPE LEICA (MISCELLANEOUS) IMPLANT
DRAPE POUCH INSTRU U-SHP 10X18 (DRAPES) ×3 IMPLANT
DRAPE SURG 17X23 STRL (DRAPES) ×3 IMPLANT
DRILL NEURO 2X3.1 SOFT TOUCH (MISCELLANEOUS) ×3
DRILL PLIF MAS DISP 5.5MM (DRILL) ×3
DRSG OPSITE 4X5.5 SM (GAUZE/BANDAGES/DRESSINGS) ×3 IMPLANT
DRSG OPSITE POSTOP 4X8 (GAUZE/BANDAGES/DRESSINGS) ×3 IMPLANT
ELECT REM PT RETURN 9FT ADLT (ELECTROSURGICAL) ×3
ELECTRODE REM PT RTRN 9FT ADLT (ELECTROSURGICAL) ×1 IMPLANT
EVACUATOR 3/16  PVC DRAIN (DRAIN) ×2
EVACUATOR 3/16 PVC DRAIN (DRAIN) ×1 IMPLANT
GAUZE SPONGE 4X4 12PLY STRL (GAUZE/BANDAGES/DRESSINGS) ×3 IMPLANT
GAUZE SPONGE 4X4 16PLY XRAY LF (GAUZE/BANDAGES/DRESSINGS) IMPLANT
GLOVE BIO SURGEON STRL SZ7 (GLOVE) ×6 IMPLANT
GLOVE BIO SURGEON STRL SZ8 (GLOVE) ×3 IMPLANT
GLOVE BIOGEL PI IND STRL 7.5 (GLOVE) ×5 IMPLANT
GLOVE BIOGEL PI IND STRL 8.5 (GLOVE) ×1 IMPLANT
GLOVE BIOGEL PI INDICATOR 7.5 (GLOVE) ×10
GLOVE BIOGEL PI INDICATOR 8.5 (GLOVE) ×2
GLOVE SS BIOGEL STRL SZ 7 (GLOVE) ×4 IMPLANT
GLOVE SUPERSENSE BIOGEL SZ 7 (GLOVE) ×8
GOWN STRL REUS W/ TWL LRG LVL3 (GOWN DISPOSABLE) ×2 IMPLANT
GOWN STRL REUS W/ TWL XL LVL3 (GOWN DISPOSABLE) ×2 IMPLANT
GOWN STRL REUS W/TWL LRG LVL3 (GOWN DISPOSABLE) ×4
GOWN STRL REUS W/TWL XL LVL3 (GOWN DISPOSABLE) ×4
HEMOSTAT POWDER KIT SURGIFOAM (HEMOSTASIS) ×3 IMPLANT
KIT BASIN OR (CUSTOM PROCEDURE TRAY) ×3 IMPLANT
KIT ROOM TURNOVER OR (KITS) ×3 IMPLANT
LIGHT SOURCE ANGLE TIP STR 7FT (MISCELLANEOUS) ×3 IMPLANT
MODULE NVM5 NEXT GEN EMG (NEEDLE) ×3 IMPLANT
NEEDLE HYPO 21X1.5 SAFETY (NEEDLE) ×6 IMPLANT
NEEDLE HYPO 25X1 1.5 SAFETY (NEEDLE) IMPLANT
NS IRRIG 1000ML POUR BTL (IV SOLUTION) ×3 IMPLANT
PACK LAMINECTOMY NEURO (CUSTOM PROCEDURE TRAY) ×3 IMPLANT
PACK UNIVERSAL I (CUSTOM PROCEDURE TRAY) ×3 IMPLANT
PAD ARMBOARD 7.5X6 YLW CONV (MISCELLANEOUS) ×9 IMPLANT
PATTIES SURGICAL .5X1.5 (GAUZE/BANDAGES/DRESSINGS) IMPLANT
ROD 5.5X40MM (Rod) ×6 IMPLANT
RUBBERBAND STERILE (MISCELLANEOUS) IMPLANT
SCREW LOCK (Screw) ×8 IMPLANT
SCREW LOCK FXNS SPNE MAS PL (Screw) ×4 IMPLANT
SCREW SHANKS 5.5X35 (Screw) ×12 IMPLANT
SCREW TULIP 5.5 (Screw) ×12 IMPLANT
SPONGE NEURO XRAY DETECT 1X3 (DISPOSABLE) ×3 IMPLANT
SPONGE SURGIFOAM ABS GEL 100 (HEMOSTASIS) ×3 IMPLANT
SUT STRATAFIX MNCRL+ 3-0 PS-2 (SUTURE) ×1
SUT STRATAFIX MONOCRYL 3-0 (SUTURE) ×2
SUT VIC AB 0 CT1 18XCR BRD8 (SUTURE) ×1 IMPLANT
SUT VIC AB 0 CT1 8-18 (SUTURE) ×2
SUT VIC AB 2-0 CT1 18 (SUTURE) ×12 IMPLANT
SUT VIC AB 3-0 SH 8-18 (SUTURE) ×3 IMPLANT
SUT VIC AB 4-0 PS2 27 (SUTURE) ×3 IMPLANT
SUTURE STRATFX MNCRL+ 3-0 PS-2 (SUTURE) ×1 IMPLANT
SYR 20CC LL (SYRINGE) ×3 IMPLANT
SYR 30ML LL (SYRINGE) ×6 IMPLANT
SYSTEM NUVAMAP OR (SYSTAGENIX WOUND MANAGEMENT) ×3 IMPLANT
TOWEL OR 17X24 6PK STRL BLUE (TOWEL DISPOSABLE) ×3 IMPLANT
TOWEL OR 17X26 10 PK STRL BLUE (TOWEL DISPOSABLE) ×3 IMPLANT
TRAY FOLEY CATH 16FRSI W/METER (SET/KITS/TRAYS/PACK) ×3 IMPLANT
TUBE CONNECTING 12'X1/4 (SUCTIONS)
TUBE CONNECTING 12X1/4 (SUCTIONS) IMPLANT
WATER STERILE IRR 1000ML POUR (IV SOLUTION) ×3 IMPLANT

## 2016-01-06 NOTE — Progress Notes (Signed)
VASCULAR LAB PRELIMINARY  PRELIMINARY  PRELIMINARY  PRELIMINARY  Bilateral lower extremity venous duplex completed.    Preliminary report:  Bilateral:  No evidence of DVT, superficial thrombosis, or Baker's Cyst.   Marella Vanderpol, RVS 01/06/2016, 12:03 PM

## 2016-01-06 NOTE — Progress Notes (Signed)
PROGRESS NOTE  Caitlin Taylor [id number] DOB: 26-Jul-1970 DOA: 01/04/2016 PCP: Manfred Shirts, PA  Brief History:  45 year old female with a history of impaired glucose tolerance, hypertension, and chronic back pain presented with 4 day history of increasing bilateral lower extremity weakness and numbness. The patient states that she has had back pain for the past 2 years but it has worsened since February 2017. The patient had a mechanical fall on 12/26/2015. She presented to ED at [facility]. The patient was discharged with prednisone which she finished on 01/03/2016. The patient represented to the ED at [facility] on 12/30/2015 and was diagnosed with UTI and started on nitrofurantoin. In the interim, the patient complains of increase right foot and leg weakness with numbness and tingling that has worsened to encompass her left lower extremity up to her knee. She has resorted to using a walker to ambulate. Upon presentation. MRI of the lumbar spine showed severe L4-5 facet arthropathy with increased facet effusions. There was also a left facet synovial cyst resulting in L4-5 mild canal stenosis. Neurosurgery was consulted in the emergency department. Previous medical records reviewed and summarized.  Assessment/Plan: Lumbar radiculopathy -Appreciate neurosurgery, Dr. Cyndy Freeze -plans noted for surgery 01/06/16 -pain control -PT/OT after surgery -01/05/2016 MRI lumbar spine--severe L4-5 facet arthropathy with increased facet effusion; left facet synovial cyst causing mild canal stenosis  UTI -12/30/2015 UA at [facility]-->+pyruia -12/30/15 urine culture- EColi -continue nitrofurantoin D#3 of 5  CKD stage II -Baseline creatinine 1.0-1.3 -Serial BMP  Hyperglycemia -Hemoglobin A1c--pending  Hypertension -Continue lisinopril HCTZ -Continue furosemide -Discontinue potassium supplementation as potassium is trending up  Lower extremity pain and  edema -Lower extremity duplex--pending   Disposition Plan: Home in 2-3 days  Family Communication: Mother updated at bedside 6/26  Consultants: Neurosurgery  Code Status: FULL   DVT Prophylaxis: [place] Lovenox   Procedures: As Listed in Progress Note Above  Antibiotics: Nitrofurantoin 01/04/16--->    Subjective: Patient continues to complain of low back pain now much better than yesterday that sharp in nature. States lower extremity weakness is about the same.  Patient denies fevers, chills, headache, chest pain, dyspnea, nausea, vomiting, diarrhea, abdominal pain, dysuria, hematuria   Objective: Filed Vitals:   01/05/16 0535 01/05/16 1355 01/05/16 2058 01/06/16 0540  BP: 138/89 135/79 126/63 135/81  Pulse: 101 115 101 94  Temp: 98.1 F (36.7 C) 97.3 F (36.3 C) 98.5 F (36.9 C) 98.4 F (36.9 C)  TempSrc: Oral  Oral Oral  Resp: 16 17 15 16   Height: 5' 3.6" (1.615 m)     Weight: 122.108 kg (269 lb 3.2 oz)     SpO2: 100% 93% 97% 100%    Intake/Output Summary (Last 24 hours) at 01/06/16 1011 Last data filed at 01/06/16 0800  Gross per 24 hour  Intake    580 ml  Output    800 ml  Net   -220 ml   Weight change:  Exam:   General:  Pt is alert, follows commands appropriately, not in acute distress  HEENT: No icterus, No thrush, No neck mass, [place]/AT  Cardiovascular: RRR, S1/S2, no rubs, no gallops  Respiratory: CTA bilaterally, no wheezing, no crackles, no rhonchi  Abdomen: Soft/+BS, non tender, non distended, no guarding  Extremities:  1 + LE edema, No lymphangitis, No petechiae, No rashes, no synovitis   Data Reviewed: I have personally reviewed following labs and imaging studies Basic Metabolic Panel:  Recent Labs Lab 01/04/16 2304 01/05/16 0716  NA 139 139  K 4.8 4.6  CL 106 108  CO2 24 27  GLUCOSE 104* 124*  BUN 18 13  CREATININE 1.17* 1.27*  CALCIUM 8.7* 8.6*   Liver Function Tests:  Recent Labs Lab 01/05/16 0716  AST 26  ALT  59*  ALKPHOS 50  BILITOT 0.8  PROT 6.3*  ALBUMIN 3.5   No results for input(s): LIPASE, AMYLASE in the last 168 hours. No results for input(s): AMMONIA in the last 168 hours. Coagulation Profile: No results for input(s): INR, PROTIME in the last 168 hours. CBC:  Recent Labs Lab 01/04/16 2304 01/05/16 0716  WBC 14.3* 11.3*  NEUTROABS 12.3*  --   HGB 12.9 11.7*  HCT 38.2 36.3  MCV 89.9 90.1  PLT 155 131*   Cardiac Enzymes: No results for input(s): CKTOTAL, CKMB, CKMBINDEX, TROPONINI in the last 168 hours. BNP: Invalid input(s): POCBNP CBG: No results for input(s): GLUCAP in the last 168 hours. HbA1C: No results for input(s): HGBA1C in the last 72 hours. Urine analysis: No results found for: COLORURINE, APPEARANCEUR, LABSPEC, PHURINE, GLUCOSEU, HGBUR, BILIRUBINUR, KETONESUR, PROTEINUR, UROBILINOGEN, NITRITE, LEUKOCYTESUR Sepsis Labs: @LABRCNTIP (procalcitonin:4,lacticidven:4) ) Recent Results (from the past 240 hour(s))  MRSA PCR Screening     Status: None   Collection Time: 01/05/16  4:26 PM  Result Value Ref Range Status   MRSA by PCR NEGATIVE NEGATIVE Final    Comment:        The GeneXpert MRSA Assay (FDA approved for NASAL specimens only), is one component of a comprehensive MRSA colonization surveillance program. It is not intended to diagnose MRSA infection nor to guide or monitor treatment for MRSA infections.   Surgical pcr screen     Status: None   Collection Time: 01/05/16  9:44 PM  Result Value Ref Range Status   MRSA, PCR NEGATIVE NEGATIVE Final   Staphylococcus aureus NEGATIVE NEGATIVE Final    Comment:        The Xpert SA Assay (FDA approved for NASAL specimens in patients over 59 years of age), is one component of a comprehensive surveillance program.  Test performance has been validated by [facility] for patients greater than or equal to 71 year old. It is not intended to diagnose infection nor to guide or monitor treatment.       Scheduled Meds: .  ceFAZolin (ANCEF) IV  3 g Intravenous To SS-Surg  . cyclobenzaprine  10 mg Oral BID  . [START ON 01/07/2016] enoxaparin (LOVENOX) injection  0.5 mg/kg Subcutaneous Q24H  . furosemide  80 mg Oral Daily  . gabapentin  300 mg Oral QHS  . lisinopril  20 mg Oral Daily   And  . hydrochlorothiazide  25 mg Oral Daily  . nitrofurantoin  100 mg Oral BID  . sodium chloride flush  3 mL Intravenous Q12H   Continuous Infusions:   Procedures/Studies: Mr Lumbar Spine Wo Contrast  01/05/2016  CLINICAL DATA:  Gradually worsening, acute on chronic constant back pain for 1 month. Pain worsening for 4 days. Reported synovial cyst of the spine. Numbness RIGHT leg. Recent falls. EXAM: MRI LUMBAR SPINE WITHOUT CONTRAST TECHNIQUE: Multiplanar, multisequence MR imaging of the lumbar spine was performed. No intravenous contrast was administered. COMPARISON:  MRI of the lumbar spine December 26, 2015 FINDINGS: Large body habitus results in decreased signal to noise ratio. OSSEOUS STRUCTURES: Lumbar vertebral bodies are intact and aligned with maintenance of lumbar lordosis. Intervertebral discs demonstrate normal morphology and signal  characteristics. No abnormal bone marrow signal. SPINAL CORD: Conus medullaris terminates at L1-2 and demonstrates normal morphology and signal characteristics. Cauda equina is normal. SOFT TISSUES: Included prevertebral and paraspinal soft tissues are normal. LEVEL BY LEVEL EVALUATION: L1-2 through L3-4:: No disc bulge, canal stenosis nor neural foraminal narrowing. L4-5: Severe facet arthropathy with increasing bilateral facet effusions, measuring up to 3 mm on the LEFT. 7 x 7 x 7 mm LEFT facet synovial cyst extending into the dorsal epidural space resulting in mild thecal sac effacement and canal stenosis. No neural foraminal narrowing. L5-S1: No disc bulge, canal stenosis nor neural foraminal narrowing. IMPRESSION: Severe L4-5 facet arthropathy with increasing facet effusions  which are likely reactive. Mild L4-5 facet widening can be seen with dynamic instability. Similar 7 x 7 x 7 mm LEFT facet synovial cyst within dorsal epidural space resulting in L4-5 mild canal stenosis. No acute fracture or malalignment. Electronically Signed   By: Elon Alas M.D.   On: 01/05/2016 03:26   Dg Scoliosis Eval Complete Spine Min 6 Views  01/05/2016  CLINICAL DATA:  Lumbar spinal stenosis. EXAM: DG SCOLIOSIS EVAL COMPLETE SPINE 6+V COMPARISON:  MRI of the lumbar spine dated 01/05/2016 FINDINGS: AP and lateral images of the entire spine were obtained and demonstrate no evidence of scoliosis. There is no bone destruction fracture. There is slight accentuation of the lumbar lordosis. IMPRESSION: 1. No scoliosis. 2. Slight accentuation of the lumbar lordosis. 3. Otherwise, normal exam. Electronically Signed   By: Lorriane Shire M.D.   On: 01/05/2016 18:47    Rasheem Figiel, DO  [facility] Pager [phone]  If 7PM-7AM, please contact night-coverage [email or web address] Password [email or web address] 01/06/2016, 10:11 AM   LOS: 1 day

## 2016-01-06 NOTE — Brief Op Note (Signed)
01/04/2016 - 01/06/2016  10:07 PM  PATIENT:  Caitlin Taylor  45 y.o. female  PRE-OPERATIVE DIAGNOSIS:  Lumbar stenosis and instability  POST-OPERATIVE DIAGNOSIS:  Lumbar stenosis and instability  PROCEDURE:  Procedure(s): FOR MAXIMUM ACCESS (MAS) POSTERIOR LUMBAR INTERBODY FUSION (PLIF) LUMBAR FOUR-FIVE (N/A)  SURGEON:  Surgeon(s) and Role:    * Kevan Ny Declan Adamson, MD - Primary    * Erline Levine, MD - Assisting  PHYSICIAN ASSISTANT:   ASSISTANTS: Erline Levine, MD  ANESTHESIA:   general  EBL:  Total I/O In: 3800 [I.V.:3800] Out: [id number] [Urine:275; Blood:400]  BLOOD ADMINISTERED:none  DRAINS: Medium large hemovac  LOCAL MEDICATIONS USED:  MARCAINE    and LIDOCAINE   SPECIMEN:  No Specimen  DISPOSITION OF SPECIMEN:  N/A  COUNTS:  YES  TOURNIQUET:  * No tourniquets in log *  DICTATION: .Dragon Dictation  PLAN OF CARE: Admit to inpatient   PATIENT DISPOSITION:  PACU - hemodynamically stable.   Delay start of Pharmacological VTE agent (>24hrs) due to surgical blood loss or risk of bleeding: yes

## 2016-01-06 NOTE — Progress Notes (Signed)
No overnight events No neuro changes Ready for OR All questions answered

## 2016-01-06 NOTE — Anesthesia Preprocedure Evaluation (Addendum)
Anesthesia Evaluation  Patient identified by MRN, date of birth, ID band Patient awake    Reviewed: Allergy & Precautions, NPO status , Patient's Chart, lab work & pertinent test results  History of Anesthesia Complications Negative for: history of anesthetic complications  Airway Mallampati: III  TM Distance: >3 FB Neck ROM: Full    Dental  (+) Teeth Intact, Dental Advisory Given   Pulmonary neg pulmonary ROS,    breath sounds clear to auscultation       Cardiovascular hypertension, Pt. on medications  Rhythm:Regular     Neuro/Psych neg Seizures  Neuromuscular disease negative psych ROS   GI/Hepatic negative GI ROS, Neg liver ROS,   Endo/Other  Morbid obesity  Renal/GU Renal InsufficiencyRenal disease     Musculoskeletal   Abdominal   Peds  Hematology negative hematology ROS (+)   Anesthesia Other Findings   Reproductive/Obstetrics                            Anesthesia Physical Anesthesia Plan  ASA: III  Anesthesia Plan: General   Post-op Pain Management:    Induction: Intravenous  Airway Management Planned: Oral ETT  Additional Equipment: None  Intra-op Plan:   Post-operative Plan: Extubation in OR  Informed Consent: I have reviewed the patients History and Physical, chart, labs and discussed the procedure including the risks, benefits and alternatives for the proposed anesthesia with the patient or authorized representative who has indicated his/her understanding and acceptance.   Dental advisory given  Plan Discussed with: CRNA and Surgeon  Anesthesia Plan Comments:         Anesthesia Quick Evaluation

## 2016-01-06 NOTE — Transfer of Care (Signed)
Immediate Anesthesia Transfer of Care Note  Patient: Caitlin Taylor  Procedure(s) Performed: Procedure(s): FOR MAXIMUM ACCESS (MAS) POSTERIOR LUMBAR INTERBODY FUSION (PLIF) LUMBAR FOUR-FIVE (N/A)  Patient Location: PACU  Anesthesia Type:General  Level of Consciousness: awake, alert  and oriented  Airway & Oxygen Therapy: Patient connected to face mask oxygen  Post-op Assessment: Report given to RN, Post -op Vital signs reviewed and stable and Patient moving all extremities X 4  Post vital signs: Reviewed and stable  Last Vitals:  Filed Vitals:   01/05/16 2058 01/06/16 0540  BP: 126/63 135/81  Pulse: 101 94  Temp: 36.9 C 36.9 C  Resp: 15 16    Last Pain:  Filed Vitals:   01/06/16 0841  PainSc: 0-No pain      Patients Stated Pain Goal: 2 ([id number])  Complications: No apparent anesthesia complications

## 2016-01-06 NOTE — Anesthesia Postprocedure Evaluation (Signed)
Anesthesia Post Note  Patient: [name]  Procedure(s) Performed: Procedure(s) (LRB): FOR MAXIMUM ACCESS (MAS) POSTERIOR LUMBAR INTERBODY FUSION (PLIF) LUMBAR FOUR-FIVE (N/A)  Patient location during evaluation: PACU Anesthesia Type: General Level of consciousness: awake and alert Pain management: pain level controlled Vital Signs Assessment: post-procedure vital signs reviewed and stable Respiratory status: spontaneous breathing, nonlabored ventilation, respiratory function stable and patient connected to nasal cannula oxygen Cardiovascular status: blood pressure returned to baseline and stable Postop Assessment: no signs of nausea or vomiting Anesthetic complications: no    Last Vitals:  Filed Vitals:   01/06/16 2245 01/06/16 2300  BP: 124/85 140/93  Pulse: 106 107  Temp:    Resp: 19 24    Last Pain:  Filed Vitals:   01/06/16 2300  PainSc: 10-Worst pain ever                 Caitlin Taylor DAVID

## 2016-01-06 NOTE — Anesthesia Procedure Notes (Signed)
Procedure Name: Intubation Date/Time: 01/06/2016 5:50 PM Performed by: Sampson Si E Pre-anesthesia Checklist: Patient identified, Emergency Drugs available, Suction available, Patient being monitored and Timeout performed Patient Re-evaluated:Patient Re-evaluated prior to inductionOxygen Delivery Method: Circle system utilized Preoxygenation: Pre-oxygenation with 100% oxygen Intubation Type: IV induction Ventilation: Mask ventilation without difficulty Laryngoscope Size: Mac and 3 Grade View: Grade I Tube type: Oral Tube size: 7.0 mm Number of attempts: 1 Airway Equipment and Method: Stylet Placement Confirmation: ETT inserted through vocal cords under direct vision,  positive ETCO2 and breath sounds checked- equal and bilateral Secured at: 20 cm Tube secured with: Tape Dental Injury: Teeth and Oropharynx as per pre-operative assessment

## 2016-01-07 ENCOUNTER — Encounter (HOSPITAL_COMMUNITY): Payer: Self-pay | Admitting: Neurological Surgery

## 2016-01-07 DIAGNOSIS — N182 Chronic kidney disease, stage 2 (mild): Secondary | ICD-10-CM | POA: Diagnosis present

## 2016-01-07 DIAGNOSIS — I129 Hypertensive chronic kidney disease with stage 1 through stage 4 chronic kidney disease, or unspecified chronic kidney disease: Secondary | ICD-10-CM | POA: Diagnosis present

## 2016-01-07 DIAGNOSIS — N183 Chronic kidney disease, stage 3 (moderate): Secondary | ICD-10-CM | POA: Diagnosis not present

## 2016-01-07 DIAGNOSIS — N390 Urinary tract infection, site not specified: Secondary | ICD-10-CM | POA: Diagnosis present

## 2016-01-07 DIAGNOSIS — G8929 Other chronic pain: Secondary | ICD-10-CM | POA: Diagnosis present

## 2016-01-07 DIAGNOSIS — G959 Disease of spinal cord, unspecified: Secondary | ICD-10-CM | POA: Diagnosis not present

## 2016-01-07 DIAGNOSIS — M5441 Lumbago with sciatica, right side: Secondary | ICD-10-CM | POA: Diagnosis not present

## 2016-01-07 DIAGNOSIS — E876 Hypokalemia: Secondary | ICD-10-CM | POA: Diagnosis not present

## 2016-01-07 DIAGNOSIS — N179 Acute kidney failure, unspecified: Secondary | ICD-10-CM | POA: Diagnosis not present

## 2016-01-07 DIAGNOSIS — Z6841 Body Mass Index (BMI) 40.0 and over, adult: Secondary | ICD-10-CM | POA: Diagnosis not present

## 2016-01-07 DIAGNOSIS — I10 Essential (primary) hypertension: Secondary | ICD-10-CM | POA: Diagnosis not present

## 2016-01-07 DIAGNOSIS — R739 Hyperglycemia, unspecified: Secondary | ICD-10-CM | POA: Diagnosis present

## 2016-01-07 DIAGNOSIS — M5416 Radiculopathy, lumbar region: Secondary | ICD-10-CM

## 2016-01-07 DIAGNOSIS — N289 Disorder of kidney and ureter, unspecified: Secondary | ICD-10-CM | POA: Diagnosis not present

## 2016-01-07 DIAGNOSIS — R609 Edema, unspecified: Secondary | ICD-10-CM | POA: Diagnosis not present

## 2016-01-07 DIAGNOSIS — M21371 Foot drop, right foot: Secondary | ICD-10-CM | POA: Diagnosis present

## 2016-01-07 DIAGNOSIS — G8918 Other acute postprocedural pain: Secondary | ICD-10-CM | POA: Diagnosis not present

## 2016-01-07 DIAGNOSIS — Z79899 Other long term (current) drug therapy: Secondary | ICD-10-CM | POA: Diagnosis not present

## 2016-01-07 DIAGNOSIS — D72829 Elevated white blood cell count, unspecified: Secondary | ICD-10-CM | POA: Diagnosis not present

## 2016-01-07 DIAGNOSIS — D62 Acute posthemorrhagic anemia: Secondary | ICD-10-CM | POA: Diagnosis not present

## 2016-01-07 DIAGNOSIS — M7138 Other bursal cyst, other site: Secondary | ICD-10-CM | POA: Diagnosis present

## 2016-01-07 DIAGNOSIS — M545 Low back pain: Secondary | ICD-10-CM | POA: Diagnosis present

## 2016-01-07 DIAGNOSIS — M4806 Spinal stenosis, lumbar region: Secondary | ICD-10-CM | POA: Diagnosis present

## 2016-01-07 DIAGNOSIS — N189 Chronic kidney disease, unspecified: Secondary | ICD-10-CM | POA: Diagnosis not present

## 2016-01-07 DIAGNOSIS — Z9181 History of falling: Secondary | ICD-10-CM | POA: Diagnosis not present

## 2016-01-07 LAB — URINALYSIS, ROUTINE W REFLEX MICROSCOPIC
Bilirubin Urine: NEGATIVE
Glucose, UA: NEGATIVE mg/dL
Ketones, ur: NEGATIVE mg/dL
Nitrite: NEGATIVE
Protein, ur: NEGATIVE mg/dL
Specific Gravity, Urine: 1.018 (ref 1.005–1.030)
pH: 5 (ref 5.0–8.0)

## 2016-01-07 LAB — HEMOGLOBIN A1C
HEMOGLOBIN A1C: 5 % (ref 4.8–5.6)
MEAN PLASMA GLUCOSE: 97 mg/dL

## 2016-01-07 LAB — URINE MICROSCOPIC-ADD ON

## 2016-01-07 LAB — BASIC METABOLIC PANEL
ANION GAP: 14 (ref 5–15)
BUN: 18 mg/dL (ref 6–20)
CALCIUM: 8.5 mg/dL — AB (ref 8.9–10.3)
CO2: 26 mmol/L (ref 22–32)
CREATININE: 1.61 mg/dL — AB (ref 0.44–1.00)
Chloride: 98 mmol/L — ABNORMAL LOW (ref 101–111)
GFR, EST AFRICAN AMERICAN: 44 mL/min — AB (ref >60)
GFR, EST NON AFRICAN AMERICAN: 38 mL/min — AB (ref >60)
Glucose, Bld: 116 mg/dL — ABNORMAL HIGH (ref 65–99)
Potassium: 3.9 mmol/L (ref 3.5–5.1)
SODIUM: 138 mmol/L (ref 135–145)

## 2016-01-07 LAB — CBC
HCT: 32.4 % — ABNORMAL LOW (ref 36.0–46.0)
Hemoglobin: 10.6 g/dL — ABNORMAL LOW (ref 12.0–15.0)
MCH: 29.1 pg (ref 26.0–34.0)
MCHC: 32.7 g/dL (ref 30.0–36.0)
MCV: 89 fL (ref 78.0–100.0)
PLATELETS: 117 10*3/uL — AB (ref 150–400)
RBC: 3.64 MIL/uL — AB (ref 3.87–5.11)
RDW: 13.8 % (ref 11.5–15.5)
WBC: 12.3 10*3/uL — ABNORMAL HIGH (ref 4.0–10.5)

## 2016-01-07 LAB — PROTEIN, URINE, RANDOM: Total Protein, Urine: 21 mg/dL

## 2016-01-07 LAB — CREATININE, URINE, RANDOM: Creatinine, Urine: 93.27 mg/dL

## 2016-01-07 LAB — SODIUM, URINE, RANDOM: Sodium, Ur: 47 mmol/L

## 2016-01-07 MED ORDER — ENOXAPARIN SODIUM 60 MG/0.6ML ~~LOC~~ SOLN
0.5000 mg/kg | SUBCUTANEOUS | Status: DC
  Administered 2016-01-08 – 2016-01-12 (×5): 60 mg via SUBCUTANEOUS
  Filled 2016-01-07 (×5): qty 0.6

## 2016-01-07 NOTE — Progress Notes (Signed)
Orthopedic Tech Progress Note Patient Details:  Caitlin Taylor 05-Mar-1971 [id number]  Patient ID: Caitlin Taylor, female   DOB: 11-15-1970, 45 y.o.   MRN: [id number] Called in bio-tech brace order; spoke with Caitlin Taylor, Caitlin Taylor 01/07/2016, 9:48 AM

## 2016-01-07 NOTE — Progress Notes (Signed)
No acute events Distal lower extremity strength slightly improved Therapy Keep drain for now

## 2016-01-07 NOTE — Progress Notes (Signed)
Occupational Therapy Evaluation Patient Details Name: Caitlin Taylor MRN: [id number] DOB: [birth year] Today's Date: 01/07/2016    History of Present Illness 45 y.o. female s/p L4-5 PLIF. PMH significant for chronic back pain, R foot drop, and HTN.   Clinical Impression   PTA, pt has required assist for LB ADLs and used RW for mobility due to increased back and RLE pain. Pt currently requires mod assist for ADLs and min assist for transfers and ambulation. Educated pt on back precautions, brace wear protocol and compensatory strategies for LB dressing. Pt plans to d/c home with 24/7 supervision from her mother. Pt will benefit from continued acute OT to increase independence and safety with ADLs and mobility to allow for safe discharge home. Recommend 3in1 and RW for home use.    Follow Up Recommendations  No OT follow up;Supervision/Assistance - 24 hour    Equipment Recommendations  3 in 1 bedside comode;Other (comment) (RW-2 wheeled)    Recommendations for Other Services       Precautions / Restrictions Precautions Precautions: Back;Fall Precaution Booklet Issued: Yes (comment) Precaution Comments: Reviewed all precautions and handout. Required Braces or Orthoses: Spinal Brace Spinal Brace: Lumbar corset;Applied in sitting position Restrictions Weight Bearing Restrictions: No      Mobility Bed Mobility Overal bed mobility: Needs Assistance Bed Mobility: Rolling;Sidelying to Sit Rolling: Min assist Sidelying to sit: Mod assist;HOB elevated       General bed mobility comments: HOB elevated, use of bedrails, exited on L side to simulate pt's mother's home environment. Assist for trunk support to come to sitting position. VCs for log roll technique and hand placement.  Transfers Overall transfer level: Needs assistance Equipment used: Rolling walker (2 wheeled) Transfers: Sit to/from [facility] Sit to Stand: Mod assist Stand pivot transfers: Min  assist       General transfer comment: Assist for boost to stand and to maintain balance upon standing. VCs for back precautions and safe hand placement on seated surfaces.    Balance Overall balance assessment: Needs assistance Sitting-balance support: No upper extremity supported;Feet supported Sitting balance-Leahy Scale: Fair     Standing balance support: Bilateral upper extremity supported;During functional activity Standing balance-Leahy Scale: Poor Standing balance comment: reliant on UE support to maintain balance                            ADL Overall ADL's : Needs assistance/impaired     Grooming: Wash/dry hands;Wash/dry face;Set up;Sitting   Upper Body Bathing: Set up;Sitting   Lower Body Bathing: Moderate assistance;Sit to/from stand   Upper Body Dressing : Set up;Sitting   Lower Body Dressing: Moderate assistance;Sit to/from stand Lower Body Dressing Details (indicate cue type and reason): unable to cross ankle-over-knee Toilet Transfer: Moderate assistance;Cueing for safety;Stand-pivot;BSC;RW Toilet Transfer Details (indicate cue type and reason): simulated to chair Toileting- Clothing Manipulation and Hygiene: Moderate assistance;Sit to/from stand Toileting - Clothing Manipulation Details (indicate cue type and reason): assist for sit-stand and for pericare     Functional mobility during ADLs: Moderate assistance;Rolling walker General ADL Comments: Reviewed back precautions and brace wear protocol. Pt's mother present for OT eval.     Vision Vision Assessment?: No apparent visual deficits   Perception     Praxis      Pertinent Vitals/Pain Pain Assessment: 0-10 Pain Score: 9  Pain Location: incision site Pain Descriptors / Indicators: Aching;Sore Pain Intervention(s): Limited activity within patient's tolerance;Monitored during session;Premedicated before session;Repositioned  Hand Dominance Right   Extremity/Trunk Assessment  Upper Extremity Assessment Upper Extremity Assessment: Overall WFL for tasks assessed   Lower Extremity Assessment Lower Extremity Assessment: Defer to PT evaluation   Cervical / Trunk Assessment Cervical / Trunk Assessment: Other exceptions Cervical / Trunk Exceptions: s/p lumbar surgery   Communication Communication Communication: No difficulties   Cognition Arousal/Alertness: Awake/alert Behavior During Therapy: WFL for tasks assessed/performed Overall Cognitive Status: Within Functional Limits for tasks assessed                     General Comments       Exercises       Shoulder Instructions      Home Living Family/patient expects to be discharged to:: Private residence (will be staying with her mother) Living Arrangements: Spouse/significant other;Children Available Help at Discharge: Family;Available 24 hours/day (mother) Type of Home: House Home Access: Stairs to enter [employer] of Steps: 1 Entrance Stairs-Rails: None Home Layout: One level     Bathroom Shower/Tub: Tub/shower unit Shower/tub characteristics: Architectural technologist: Standard     Home Equipment: Hand held shower head          Prior Functioning/Environment Level of Independence: Needs assistance  Gait / Transfers Assistance Needed: for a week and a half she has been using her mom's RW.  Mom uses RW at times too ADL's / Homemaking Assistance Needed: Assist with LB ADLs and IADLs for past 2 weeks        OT Diagnosis: Acute pain   OT Problem List: Decreased activity tolerance;Impaired balance (sitting and/or standing);Decreased safety awareness;Decreased knowledge of use of DME or AE;Decreased knowledge of precautions;Obesity;Pain   OT Treatment/Interventions: Self-care/ADL training;Therapeutic exercise;Energy conservation;DME and/or AE instruction;Therapeutic activities;Patient/family education;Balance training    OT Goals(Current goals can be found in the care plan  section) Acute Rehab OT Goals Patient Stated Goal: to get better and to go home OT Goal Formulation: With patient Time For Goal Achievement: 01/21/16 Potential to Achieve Goals: Good ADL Goals Pt Will Perform Grooming: with supervision;standing Pt Will Perform Lower Body Bathing: with supervision;with adaptive equipment;sit to/from stand Pt Will Perform Lower Body Dressing: with supervision;with adaptive equipment;sit to/from stand Pt Will Transfer to Toilet: with supervision;ambulating;bedside commode (over toilet) Pt Will Perform Toileting - Clothing Manipulation and hygiene: with supervision;with adaptive equipment;sitting/lateral leans;sit to/from stand Pt Will Perform Tub/Shower Transfer: Tub transfer;with supervision;ambulating;3 in 1;rolling walker Additional ADL Goal #1: Pt will independently don/doff back brace to increase independence with ADLs. Additional ADL Goal #2: Pt will demonstrate adherence to 3/3 back precautions with no verbal cues.  OT Frequency: Min 2X/week   Barriers to D/C:            Co-evaluation PT/OT/SLP Co-Evaluation/Treatment: Yes Reason for Co-Treatment: For patient/therapist safety   OT goals addressed during session: ADL's and self-care;Proper use of Adaptive equipment and DME      End of Session Equipment Utilized During Treatment: Gait belt;Rolling walker;Back brace Nurse Communication: Mobility status  Activity Tolerance: Patient limited by pain Patient left: in chair;with call bell/phone within reach;with chair alarm set;with family/visitor present   Time: [id number] OT Time Calculation (min): 34 min Charges:  OT General Charges $OT Visit: 1 Procedure OT Evaluation $OT Eval Moderate Complexity: 1 Procedure G-Codes:    Redmond Baseman, OTR/L [phone] 01/07/2016, 4:31 PM

## 2016-01-07 NOTE — Progress Notes (Signed)
Hemovac emptied. 110cc of blood emptied from vac. Vac recoiled for suctioning.

## 2016-01-07 NOTE — Evaluation (Signed)
Physical Therapy Evaluation Patient Details Name: Caitlin Taylor MRN: [id number] DOB: August 26, 1970 Today's Date: 01/07/2016   History of Present Illness  45 y.o. female s/p L4-5 PLIF. PMH significant for chronic back pain, R foot drop, and HTN.  Clinical Impression  Pt is POD #1 and was able to get OOB to chair with RW.  She is limited today by normal post op pain and weakness and does report some improvement in symptoms in right leg, however still weak and numb on this side when tested.  Pt will likely progress well enough to d/c home with mom's supervision and needed equipment.  She would benefit from reinforcement of safe mobility and precautions by HHPT as well as safe activity progression.   PT to follow acutely for deficits listed below.       Follow Up Recommendations Home health PT    Equipment Recommendations  Rolling walker with 5" wheels;3in1 (PT) (wide for both)    Recommendations for Other Services   NA    Precautions / Restrictions Precautions Precautions: Back;Fall Precaution Booklet Issued: Yes (comment) Precaution Comments: Reviewed all precautions and handout. Required Braces or Orthoses: Spinal Brace Spinal Brace: Lumbar corset;Applied in sitting position Restrictions Weight Bearing Restrictions: No      Mobility  Bed Mobility Overal bed mobility: Needs Assistance Bed Mobility: Rolling;Sidelying to Sit Rolling: Min assist Sidelying to sit: Mod assist;HOB elevated       General bed mobility comments: HOB elevated, use of bedrails, exited on L side to simulate pt's mother's home environment. Assist for trunk support to come to sitting position. VCs for log roll technique and hand placement.  Transfers Overall transfer level: Needs assistance Equipment used: Rolling walker (2 wheeled) Transfers: Sit to/from [facility] Sit to Stand: +2 safety/equipment;Mod assist Stand pivot transfers: +2 safety/equipment;Min assist       General  transfer comment: Assist to scoot hips to EOB and verbal cues for safe hand placement during transitions. Pt needed help tp power up to stand, but once standing, she needed less assistance for the actual transfer to her left side with RW.    Ambulation/Gait         Gait velocity: NT, pt limited by pain.                   Balance Overall balance assessment: Needs assistance Sitting-balance support: Feet supported;Bilateral upper extremity supported;No upper extremity supported Sitting balance-Leahy Scale: Fair     Standing balance support: Bilateral upper extremity supported Standing balance-Leahy Scale: Poor                               Pertinent Vitals/Pain Pain Assessment: 0-10 Pain Score: 9  Pain Location: incisional Pain Descriptors / Indicators: Aching;Burning Pain Intervention(s): Limited activity within patient's tolerance;Monitored during session;Repositioned    Home Living Family/patient expects to be discharged to:: Private residence (planning to go stay with her mother) Living Arrangements: Spouse/significant other;Children (55 y.o twins) Available Help at Discharge: Family;Available 24 hours/day (mother) Type of Home: House Home Access: Stairs to enter Entrance Stairs-Rails: None Entrance Stairs-Number of Steps: 1 Home Layout: One level Home Equipment: Hand held shower head      Prior Function Level of Independence: Needs assistance   Gait / Transfers Assistance Needed: for a week and a half she has been using her mom's RW.  Mom uses RW at times too  Hand Dominance   Dominant Hand: Right    Extremity/Trunk Assessment   Upper Extremity Assessment: Defer to OT evaluation           Lower Extremity Assessment: RLE deficits/detail RLE Deficits / Details: right leg with significant weakness in right ankle and decreased sensation to LT throughout lower leg.  trace ankle DF, weak ankle PF. Pt reports that this only  recently got this bad and that she was not wearing a brace to help with her foot drop at baseline.     Cervical / Trunk Assessment: Other exceptions  Communication   Communication: No difficulties  Cognition Arousal/Alertness: Awake/alert Behavior During Therapy: WFL for tasks assessed/performed Overall Cognitive Status: Within Functional Limits for tasks assessed                               Assessment/Plan    PT Assessment Patient needs continued PT services  PT Diagnosis Difficulty walking;Abnormality of gait;Generalized weakness;Acute pain   PT Problem List Decreased strength;Decreased range of motion;Decreased activity tolerance;Decreased balance;Decreased mobility;Decreased knowledge of use of DME;Decreased knowledge of precautions;Impaired sensation;Pain;Obesity  PT Treatment Interventions DME instruction;Gait training;Stair training;Functional mobility training;Therapeutic activities;Therapeutic exercise;Balance training;Neuromuscular re-education;Patient/family education;Manual techniques;Modalities   PT Goals (Current goals can be found in the Care Plan section) Acute Rehab PT Goals Patient Stated Goal: to go home with mom while she is recovering PT Goal Formulation: With patient Time For Goal Achievement: 01/14/16 Potential to Achieve Goals: Good    Frequency Min 5X/week        Co-evaluation PT/OT/SLP Co-Evaluation/Treatment: Yes Reason for Co-Treatment: Complexity of the patient's impairments (multi-system involvement);For patient/therapist safety           End of Session Equipment Utilized During Treatment: Back brace Activity Tolerance: Patient limited by pain Patient left: in chair;with call bell/phone within reach;with family/visitor present           Time: [id number] PT Time Calculation (min) (ACUTE ONLY): 38 min   Charges:   PT Evaluation $PT Eval Moderate Complexity: 1 Procedure PT Treatments $Therapeutic Activity: 8-22 mins         Cliffie Gingras B. McLean, [place], DPT [phone]   01/07/2016, 11:09 PM

## 2016-01-07 NOTE — Op Note (Signed)
01/04/2016 - 01/06/2016  11:25 AM  PATIENT:  Caitlin Taylor  44 y.o. female  PRE-OPERATIVE DIAGNOSIS:  Lumbar stenosis L4-5, bilateral synovial cysts, foot drop, morbid obesity  POST-OPERATIVE DIAGNOSIS:  Same  PROCEDURE:  L4-5 posterior lumbar interbody fusion with titanium interbody cages and cortical trajectory pedicle screws at L4 and L5 bilaterally, use of morsellized allograft  SURGEON:  Aldean Ast, MD  ASSISTANTS: Erline Levine, MD  ANESTHESIA:   General  DRAINS: Medium large hemovac   SPECIMEN:  None  INDICATION FOR PROCEDURE: 45 year old female severe lumbar stenosis at L4-5 due to bilateral synovial cysts.  I recommended the above operation. Patient understood the risks, benefits, and alternatives and potential outcomes and wished to proceed.  PROCEDURE DETAILS: After smooth induction of general endotracheal anesthesia the patient was turned prone on the [place] table. The skin of the lumbar area was clipped of hair and wiped out with alcohol. It was prepped and draped in the usual sterile fashion. The planned incision was injected with a mixture of lidocaine and Marcaine with epinephrine.  The skin was opened sharply.  Due to the patient's body habitus a larger than customary skin incision had to be made.  Subperiosteal dissection was performed to expose the lateral edges of the lamina of L4-5. A Nuvasive MAS PLIF retractor with the longest available blades was inserted into the field and opened over the facets.  Subperiosteal dissection was performed over the L4-5 facet joints bilaterally.  Using AP and lateral fluoroscopy cortical trajectory screws were placed at L4 and L5. A wide decompression including superior facetectomy and partial inferior facetectomy was performed at L4-5 to decompress the thecal sac and nerve roots. The exiting L4 and traversing L5 nerve roots were identified and found to to be adequately decompressed at this point.  At L4-5 an annulotomy was  made. Using sequentially larger disc shavers this space was expanded. Disc material was removed with shavers, curettes, and the bone was decorticated with a rasp. The interbody space was packed with a combination morsellized allograft and locally harvested bone and two titanium spacers were inserted into the interbody space.  A lordotic rod was inserted and secured in the screw caps. Final tightening with a torque wrench was performed at all levels. Meticulous hemostasis was obtained. The wound was irrigated with bacitracin saline. Exparel was injected into the paraspinous muscles.  A medium large Hemovac drain was placed within the fat layers. About 1 g of vancomycin powder was inserted into the wound. The wound was closed by closing the fascia with interrupted vicryl sutures.  The fat was closed in multiple layers to eliminate a dead space.  Because of the patient's body habitus closure took approximately 30 minutes longer than typical for this size incision. The skin was closed with a running subcuticular monocryl suture and then sealed with dermabond.   The patient was then returned to the supine position on the bed.   PATIENT DISPOSITION:  PACU - hemodynamically stable.   Delay start of Pharmacological VTE agent (>24hrs) due to surgical blood loss or risk of bleeding:  yes

## 2016-01-07 NOTE — Progress Notes (Signed)
PROGRESS NOTE  Caitlin Taylor [id number] DOB: April 16, 1971 DOA: 01/04/2016 PCP: Manfred Shirts, PA  Brief History:  45 year old female with a history of impaired glucose tolerance, hypertension, and chronic back pain presented with 4 day history of increasing bilateral lower extremity weakness and numbness. The patient states that she has had back pain for the past 2 years but it has worsened since February 2017. The patient had a mechanical fall on 12/26/2015. She presented to ED at [facility]. The patient was discharged with prednisone which she finished on 01/03/2016. The patient represented to the ED at [facility] on 12/30/2015 and was diagnosed with UTI and started on nitrofurantoin. In the interim, the patient complains of increase right foot and leg weakness with numbness and tingling that has worsened to encompass her left lower extremity up to her knee. She has resorted to using a walker to ambulate. Upon presentation. MRI of the lumbar spine showed severe L4-5 facet arthropathy with increased facet effusions. There was also a left facet synovial cyst resulting in L4-5 mild canal stenosis. Neurosurgery was consulted in the emergency department. Previous medical records reviewed and summarized.  Assessment/Plan: Lumbar radiculopathy - S/P L4-5 posterior lumbar interbody fusion with titanium interbody cages and cortical trajectory pedicle screws at L4 and L5 bilaterally, use of morsellized allograft on 01/06/16 -Post op management as per Ortho team -01/05/2016 MRI lumbar spine--severe L4-5 facet arthropathy with increased facet effusion; left facet synovial cyst causing mild canal stenosis  UTI -12/30/2015 UA at [facility]-->+pyruia -12/30/15 urine culture- EColi -continue nitrofurantoin D#3 of 5  AKI on CKD stage II -Baseline creatinine 1.0-1.3 -Serial BMP - DC HCTZ, lisinopril, lasix and celebrex. AKI work up. Monitor renal function and  electrolytes.  Hypertension - Optimize  Lower extremity pain and edema - No DVT as per doppler ultrasound.   Disposition Plan:Will depend on hospital course and Surgical recommendation.  Family Communication: Husband and Mother updated at bedside 6/26  Consultants: Neurosurgery  Code Status: FULL   DVT Prophylaxis:  Lovenox   Procedures: As Listed in Progress Note Above  Antibiotics: Nitrofurantoin 01/04/16--->    Subjective: Nil new complaints. Worsening renal function noted. No fever or chills. No SOB or chest pain. No nausea or vomiting.   Objective: Filed Vitals:   01/07/16 1215 01/07/16 1235 01/07/16 1438 01/07/16 1452  BP: 135/95 99/60 96/61  124/75  Pulse:  109 75 114  Temp:   97.8 F (36.6 C)   TempSrc:      Resp:   17   Height:      Weight:      SpO2:  98% 96%     Intake/Output Summary (Last 24 hours) at 01/07/16 1624 Last data filed at 01/07/16 1354  Gross per 24 hour  Intake   5120 ml  Output   1225 ml  Net   3895 ml   Weight change:  Exam:   General:  Pt is alert, follows commands appropriately, not in acute distress  HEENT: No icterus, No thrush, No neck mass, [place]/AT  Cardiovascular: RRR, S1/S2, no rubs, no gallops  Respiratory: CTA bilaterally, no wheezing, no crackles, no rhonchi  Abdomen: Soft/+BS, non tender, non distended, no guarding  Extremities:  1 + LE edema, No lymphangitis, No petechiae, No rashes, no synovitis   Data Reviewed: I have personally reviewed following labs and imaging studies Basic Metabolic Panel:  Recent Labs Lab 01/04/16 2304 01/05/16 0716 01/06/16 1233  01/07/16 0621  NA 139 139 137 138  K 4.8 4.6 4.6 3.9  CL 106 108 100* 98*  CO2 24 27 28 26   GLUCOSE 104* 124* 100* 116*  BUN 18 13 15 18   CREATININE 1.17* 1.27* 1.32* 1.61*  CALCIUM 8.7* 8.6* 9.3 8.5*   Liver Function Tests:  Recent Labs Lab 01/05/16 0716  AST 26  ALT 59*  ALKPHOS 50  BILITOT 0.8  PROT 6.3*  ALBUMIN 3.5   No  results for input(s): LIPASE, AMYLASE in the last 168 hours. No results for input(s): AMMONIA in the last 168 hours. Coagulation Profile: No results for input(s): INR, PROTIME in the last 168 hours. CBC:  Recent Labs Lab 01/04/16 2304 01/05/16 0716 01/07/16 0621  WBC 14.3* 11.3* 12.3*  NEUTROABS 12.3*  --   --   HGB 12.9 11.7* 10.6*  HCT 38.2 36.3 32.4*  MCV 89.9 90.1 89.0  PLT 155 131* 117*   Cardiac Enzymes: No results for input(s): CKTOTAL, CKMB, CKMBINDEX, TROPONINI in the last 168 hours. BNP: Invalid input(s): POCBNP CBG: No results for input(s): GLUCAP in the last 168 hours. HbA1C:  Recent Labs  01/06/16 1233  HGBA1C 5.0   Urine analysis: No results found for: COLORURINE, APPEARANCEUR, LABSPEC, PHURINE, GLUCOSEU, HGBUR, BILIRUBINUR, KETONESUR, PROTEINUR, UROBILINOGEN, NITRITE, LEUKOCYTESUR Sepsis Labs: @LABRCNTIP (procalcitonin:4,lacticidven:4) ) Recent Results (from the past 240 hour(s))  MRSA PCR Screening     Status: None   Collection Time: 01/05/16  4:26 PM  Result Value Ref Range Status   MRSA by PCR NEGATIVE NEGATIVE Final    Comment:        The GeneXpert MRSA Assay (FDA approved for NASAL specimens only), is one component of a comprehensive MRSA colonization surveillance program. It is not intended to diagnose MRSA infection nor to guide or monitor treatment for MRSA infections.   Surgical pcr screen     Status: None   Collection Time: 01/05/16  9:44 PM  Result Value Ref Range Status   MRSA, PCR NEGATIVE NEGATIVE Final   Staphylococcus aureus NEGATIVE NEGATIVE Final    Comment:        The Xpert SA Assay (FDA approved for NASAL specimens in patients over 6 years of age), is one component of a comprehensive surveillance program.  Test performance has been validated by [facility] for patients greater than or equal to 44 year old. It is not intended to diagnose infection nor to guide or monitor treatment.      Scheduled Meds: .  acetaminophen  1,000 mg Oral Q6H  . docusate sodium  100 mg Oral BID  . [START ON 01/08/2016] enoxaparin (LOVENOX) injection  0.5 mg/kg Subcutaneous Q24H  . furosemide  80 mg Oral Daily  . gabapentin  300 mg Oral TID  . methocarbamol  750 mg Oral QID  . nitrofurantoin  100 mg Oral BID  . oxyCODONE  20 mg Oral Q12H  . senna  1 tablet Oral BID  . sodium chloride flush  3 mL Intravenous Q12H  . sodium chloride flush  3 mL Intravenous Q12H   Continuous Infusions: . sodium chloride 100 mL/hr at 01/07/16 1110  . sodium chloride      Procedures/Studies: Dg Lumbar Spine 2-3 Views  01/06/2016  CLINICAL DATA:  L4-5 PLIF. EXAM: Operative LUMBAR SPINE - 2-3 VIEW COMPARISON:  MRI lumbar spine 01/05/2016. FINDINGS: Two spot images from the C-arm fluoroscopic device, AP and lateral views of the lower lumbar spine are submitted for interpretation postoperatively. L4-5 PLIF and interbody  fusion with anatomic alignment. The radiologic technologist documented 1 min of fluoroscopy time. IMPRESSION: L4-5 PLIF and interbody fusion with anatomic alignment. Electronically Signed   By: Evangeline Dakin M.D.   On: 01/06/2016 21:51   Mr Lumbar Spine Wo Contrast  01/05/2016  CLINICAL DATA:  Gradually worsening, acute on chronic constant back pain for 1 month. Pain worsening for 4 days. Reported synovial cyst of the spine. Numbness RIGHT leg. Recent falls. EXAM: MRI LUMBAR SPINE WITHOUT CONTRAST TECHNIQUE: Multiplanar, multisequence MR imaging of the lumbar spine was performed. No intravenous contrast was administered. COMPARISON:  MRI of the lumbar spine December 26, 2015 FINDINGS: Large body habitus results in decreased signal to noise ratio. OSSEOUS STRUCTURES: Lumbar vertebral bodies are intact and aligned with maintenance of lumbar lordosis. Intervertebral discs demonstrate normal morphology and signal characteristics. No abnormal bone marrow signal. SPINAL CORD: Conus medullaris terminates at L1-2 and demonstrates normal  morphology and signal characteristics. Cauda equina is normal. SOFT TISSUES: Included prevertebral and paraspinal soft tissues are normal. LEVEL BY LEVEL EVALUATION: L1-2 through L3-4:: No disc bulge, canal stenosis nor neural foraminal narrowing. L4-5: Severe facet arthropathy with increasing bilateral facet effusions, measuring up to 3 mm on the LEFT. 7 x 7 x 7 mm LEFT facet synovial cyst extending into the dorsal epidural space resulting in mild thecal sac effacement and canal stenosis. No neural foraminal narrowing. L5-S1: No disc bulge, canal stenosis nor neural foraminal narrowing. IMPRESSION: Severe L4-5 facet arthropathy with increasing facet effusions which are likely reactive. Mild L4-5 facet widening can be seen with dynamic instability. Similar 7 x 7 x 7 mm LEFT facet synovial cyst within dorsal epidural space resulting in L4-5 mild canal stenosis. No acute fracture or malalignment. Electronically Signed   By: Elon Alas M.D.   On: 01/05/2016 03:26   Dg C-arm 1-60 Min  01/06/2016  CLINICAL DATA:  L4-5 PLIF. EXAM: Operative LUMBAR SPINE - 2-3 VIEW COMPARISON:  MRI lumbar spine 01/05/2016. FINDINGS: Two spot images from the C-arm fluoroscopic device, AP and lateral views of the lower lumbar spine are submitted for interpretation postoperatively. L4-5 PLIF and interbody fusion with anatomic alignment. The radiologic technologist documented 1 min of fluoroscopy time. IMPRESSION: L4-5 PLIF and interbody fusion with anatomic alignment. Electronically Signed   By: Evangeline Dakin M.D.   On: 01/06/2016 21:51   Dg Scoliosis Eval Complete Spine Min 6 Views  01/05/2016  CLINICAL DATA:  Lumbar spinal stenosis. EXAM: DG SCOLIOSIS EVAL COMPLETE SPINE 6+V COMPARISON:  MRI of the lumbar spine dated 01/05/2016 FINDINGS: AP and lateral images of the entire spine were obtained and demonstrate no evidence of scoliosis. There is no bone destruction fracture. There is slight accentuation of the lumbar lordosis.  IMPRESSION: 1. No scoliosis. 2. Slight accentuation of the lumbar lordosis. 3. Otherwise, normal exam. Electronically Signed   By: Lorriane Shire M.D.   On: 01/05/2016 18:47    Bonnell Public, DO  [facility] Pager [phone]  If 7PM-7AM, please contact night-coverage [email or web address] Password [email or web address] 01/07/2016, 4:24 PM   LOS: 2 days

## 2016-01-08 LAB — RENAL FUNCTION PANEL
Albumin: 3.2 g/dL — ABNORMAL LOW (ref 3.5–5.0)
Anion gap: 9 (ref 5–15)
BUN: 19 mg/dL (ref 6–20)
CO2: 27 mmol/L (ref 22–32)
Calcium: 8.1 mg/dL — ABNORMAL LOW (ref 8.9–10.3)
Chloride: 99 mmol/L — ABNORMAL LOW (ref 101–111)
Creatinine, Ser: 1.61 mg/dL — ABNORMAL HIGH (ref 0.44–1.00)
GFR calc Af Amer: 44 mL/min — ABNORMAL LOW (ref >60)
GFR calc non Af Amer: 38 mL/min — ABNORMAL LOW (ref >60)
Glucose, Bld: 101 mg/dL — ABNORMAL HIGH (ref 65–99)
Phosphorus: 3.9 mg/dL (ref 2.5–4.6)
Potassium: 3.9 mmol/L (ref 3.5–5.1)
Sodium: 135 mmol/L (ref 135–145)

## 2016-01-08 MED ORDER — ALUM & MAG HYDROXIDE-SIMETH 200-200-20 MG/5ML PO SUSP
30.0000 mL | Freq: Once | ORAL | Status: AC
  Administered 2016-01-08: 30 mL via ORAL
  Filled 2016-01-08: qty 30

## 2016-01-08 NOTE — Clinical Documentation Improvement (Signed)
Neuro Surgery  Would you please clarify medical condition related to clinical findings?   Obesity  Morbid Obesity  Other  Clinically Undetermined  Document any associated diagnoses/conditions.   Supporting Information: BMI 47.   Please exercise your independent, professional judgment when responding. A specific answer is not anticipated or expected.   Thank You,  [place] [phone]

## 2016-01-08 NOTE — Progress Notes (Addendum)
Inpatient Rehabilitation  Received call from PT and OT clarifying prior level of functioning as Independent and that patient does have a skilled need for both OT and PT therapies at this time.  Given this clarification we are recommending an Inpatient Rehab consult.  MD text paged to notify.  Please order if you are agreeable.    Carmelia Roller., CCC/SLP Admission Coordinator  [place]  Cell [phone]

## 2016-01-08 NOTE — Progress Notes (Signed)
Occupational Therapy Treatment Patient Details Name: Caitlin Taylor MRN: [id number] DOB: 11/27/1970 Today's Date: 01/08/2016    History of present illness 45 y.o. female s/p L4-5 PLIF. PMH significant for chronic back pain, R foot drop, and HTN.   OT comments  Pt progressingly slowly, but steadily. Pt required mod assist for LB ADLs and min assist for functional transfers with close min guard assist during ambulation due to significant BLE weakness and buckling. Pt with soaked incisional dressing and drain leaking onto floor - RN notified and came to change dressing. Updated OT follow up recommendations to reflect pt's recovery progress and now feel pt will need more intensive therapy stay to address BLE weakness and balance deficits in order to regain independent prior level of function. Will continue to follow acutely.   Follow Up Recommendations  CIR;Supervision/Assistance - 24 hour    Equipment Recommendations  3 in 1 bedside comode;Other (comment) (RW- 2 wheeled)    Recommendations for Other Services      Precautions / Restrictions Precautions Precautions: Back;Fall Precaution Comments: Able to recall 2/3 back precautions at beginning of session. Required Braces or Orthoses: Spinal Brace Spinal Brace: Lumbar corset;Applied in sitting position Restrictions Weight Bearing Restrictions: No       Mobility Bed Mobility Overal bed mobility: Needs Assistance Bed Mobility: Sidelying to Sit   Sidelying to sit: Min assist       General bed mobility comments: Assist for trunk support to come to sitting position. VCs for log roll technique.  Transfers Overall transfer level: Needs assistance Equipment used: Rolling walker (2 wheeled) Transfers: Sit to/from Stand Sit to Stand: Min assist         General transfer comment: Assist for boost to stand and to stabilize balance upon standing. VCs for safe hand placement and for back precautions.    Balance Overall balance  assessment: Needs assistance Sitting-balance support: No upper extremity supported;Feet supported Sitting balance-Leahy Scale: Good     Standing balance support: Bilateral upper extremity supported;During functional activity Standing balance-Leahy Scale: Poor Standing balance comment: heavy reliance on UE support for balance during all tasks                   ADL Overall ADL's : Needs assistance/impaired         Upper Body Bathing: Set up;Sitting   Lower Body Bathing: Moderate assistance;Sit to/from stand   Upper Body Dressing : Minimal assistance;Sitting Upper Body Dressing Details (indicate cue type and reason): to don/doff back brace Lower Body Dressing: Moderate assistance;Sit to/from stand   Toilet Transfer: Minimal assistance;Cueing for safety;Ambulation;BSC;RW   Toileting- Clothing Manipulation and Hygiene: Minimal assistance;Sit to/from stand Toileting - Clothing Manipulation Details (indicate cue type and reason): educated on use of wet wipes     Functional mobility during ADLs: Minimal assistance;Rolling walker General ADL Comments: Session limited as pt's dressing was soaked and draining was leaking significantly.       Vision                     Perception     Praxis      Cognition   Behavior During Therapy: [facility] for tasks assessed/performed Overall Cognitive Status: Within Functional Limits for tasks assessed                       Extremity/Trunk Assessment               Exercises     Shoulder Instructions  General Comments      Pertinent Vitals/ Pain       Pain Assessment: 0-10 Pain Score: 5  Pain Location: incision site Pain Descriptors / Indicators: Aching;Sore Pain Intervention(s): Limited activity within patient's tolerance;Repositioned;Premedicated before session;Monitored during session  Home Living                                          Prior Functioning/Environment               Frequency Min 3X/week     Progress Toward Goals  OT Goals(current goals can now be found in the care plan section)  Progress towards OT goals: Progressing toward goals  Acute Rehab OT Goals Patient Stated Goal: to be able to walk normally again OT Goal Formulation: With patient Time For Goal Achievement: 01/21/16 Potential to Achieve Goals: Good ADL Goals Pt Will Perform Grooming: with supervision;standing Pt Will Perform Lower Body Bathing: with supervision;with adaptive equipment;sit to/from stand Pt Will Perform Lower Body Dressing: with supervision;with adaptive equipment;sit to/from stand Pt Will Transfer to Toilet: with supervision;ambulating;bedside commode Pt Will Perform Toileting - Clothing Manipulation and hygiene: with supervision;with adaptive equipment;sitting/lateral leans;sit to/from stand Pt Will Perform Tub/Shower Transfer: Tub transfer;with supervision;ambulating;3 in 1;rolling walker Additional ADL Goal #1: Pt will independently don/doff back brace to increase independence with ADLs. Additional ADL Goal #2: Pt will demonstrate adherence to 3/3 back precautions with no verbal cues.  Plan Discharge plan needs to be updated    Co-evaluation                 End of Session Equipment Utilized During Treatment: Gait belt;Rolling walker;Back brace   Activity Tolerance Other (comment) (Treatment limited due to drain leakage)   Patient Left in chair;with call bell/phone within reach;with nursing/sitter in room   Nurse Communication Mobility status        Time: [id number] OT Time Calculation (min): 22 min  Charges: OT General Charges $OT Visit: 1 Procedure OT Treatments $Self Care/Home Management : 8-22 mins  Redmond Baseman, OTR/L Pager: [phone] 01/08/2016, 5:12 PM

## 2016-01-08 NOTE — Progress Notes (Signed)
Physical Therapy Treatment Patient Details Name: Caitlin Taylor MRN: [id number] DOB: December 30, 1970 Today's Date: 01/08/2016    History of Present Illness 45 y.o. female s/p L4-5 PLIF. PMH significant for chronic back pain, R foot drop, and HTN.    PT Comments    Patient progressing with ambulation this session, but very difficult with poor LE endurance, decreased proprioceptive feedback, very high fall risk and extreme cardiopulmonary stress with short distance ambulation.  Feel she is appropriate for CIR level rehab.  Will continue skilled PT in the acute setting.  Follow Up Recommendations  CIR     Equipment Recommendations  Rolling walker with 5" wheels;3in1 (PT)    Recommendations for Other Services Rehab consult     Precautions / Restrictions Precautions Precautions: Back;Fall Precaution Comments: recall 2/3 back precautions Required Braces or Orthoses: Spinal Brace Spinal Brace: Lumbar corset;Applied in sitting position    Mobility  Bed Mobility Overal bed mobility: Needs Assistance Bed Mobility: Sit to Sidelying Rolling: Min assist       Sit to sidelying: Mod assist General bed mobility comments: assist for legs onto bed and cues/demo for technique; assist for positioning in supine  Transfers   Equipment used: Rolling walker (2 wheeled) Transfers: Sit to/from Stand Sit to Stand: Mod assist         General transfer comment: up from chair and then from bed with cues for hand placement and lifting assist  Ambulation/Gait Ambulation/Gait assistance: Mod assist Ambulation Distance (Feet): 60 Feet Assistive device: Rolling walker (2 wheeled) Gait Pattern/deviations: Step-to pattern;Steppage;Wide base of support;Ataxic;Decreased stride length;Trendelenburg     General Gait Details: weakness in LE's, reports legs and arms feel really heavy; several stops to rest in hallway with HR up to 123 BP 177/57 RR 40.     Stairs            Wheelchair Mobility    Modified Rankin (Stroke Patients Only)       Balance Overall balance assessment: Needs assistance Sitting-balance support: Feet supported;No upper extremity supported Sitting balance-Leahy Scale: Good     Standing balance support: Bilateral upper extremity supported Standing balance-Leahy Scale: Poor Standing balance comment: reliant on UE support for balance                    Cognition Arousal/Alertness: Awake/alert Behavior During Therapy: WFL for tasks assessed/performed Overall Cognitive Status: Within Functional Limits for tasks assessed                      Exercises      General Comments        Pertinent Vitals/Pain Pain Score: 6  Pain Location: incisional Pain Descriptors / Indicators: Operative site guarding Pain Intervention(s): Repositioned;Monitored during session;Limited activity within patient's tolerance    Home Living                      Prior Function            PT Goals (current goals can now be found in the care plan section) Progress towards PT goals: Progressing toward goals    Frequency  Min 5X/week    PT Plan Discharge plan needs to be updated    Co-evaluation             End of Session Equipment Utilized During Treatment: Back brace Activity Tolerance: Patient limited by fatigue Patient left: in bed;with call bell/phone within reach     Time: 0929-1000 PT Time Calculation (  min) (ACUTE ONLY): 31 min  Charges:  $Gait Training: 8-22 mins $Therapeutic Activity: 8-22 mins                    G Codes:      Reginia Naas January 23, 2016, 10:01 AM  Magda Kiel, [place] 01-23-16

## 2016-01-08 NOTE — Progress Notes (Signed)
Inpatient Rehabilitation  Per PT request, patient was screened by Gunnar Fusi for appropriateness for an Inpatient Acute Rehab consult.  Per chart review OT has signed off due to patient being at or close to baseline with no skilled needs.  In order to be an IP Rehab candidate patient needs to have two disciplines of skilled therapy needs.  Plan to follow along should need arise; however, patient's insurance does require prior authorization.  Please call with questions.    Carmelia Roller., CCC/SLP Admission Coordinator  [place]  Cell [phone]

## 2016-01-08 NOTE — Progress Notes (Signed)
No acute events Doing well Pain improved Neuro stable, marginal improvement in left dorsi- and plantarflexion Ok to discharge from my perspective D/c drain prior to discharge F/u with me in 3 weeks Explained to patient that weakness is slow to improve and that numbness is always the last neurological deficit to improve, hopefully it will get better with time

## 2016-01-08 NOTE — Progress Notes (Signed)
PROGRESS NOTE  Caitlin Taylor [id number] DOB: 03/12/1971 DOA: 01/04/2016 PCP: Manfred Shirts, PA  Brief History:  45 year old female with a history of impaired glucose tolerance, hypertension, and chronic back pain presented with 4 day history of increasing bilateral lower extremity weakness and numbness. The patient states that she has had back pain for the past 2 years but it has worsened since February 2017. The patient had a mechanical fall on 12/26/2015. She presented to ED at [facility]. The patient was discharged with prednisone which she finished on 01/03/2016. The patient represented to the ED at [facility] on 12/30/2015 and was diagnosed with UTI and started on nitrofurantoin. In the interim, the patient complains of increase right foot and leg weakness with numbness and tingling that has worsened to encompass her left lower extremity up to her knee. She has resorted to using a walker to ambulate. Upon presentation. MRI of the lumbar spine showed severe L4-5 facet arthropathy with increased facet effusions. There was also a left facet synovial cyst resulting in L4-5 mild canal stenosis. Neurosurgery was consulted in the emergency department. Previous medical records reviewed and summarized.  Assessment/Plan: Lumbar radiculopathy - S/P L4-5 posterior lumbar interbody fusion with titanium interbody cages and cortical trajectory pedicle screws at L4 and L5 bilaterally, use of morsellized allograft on 01/06/16 -Post op management as per Ortho team -01/05/2016 MRI lumbar spine--severe L4-5 facet arthropathy with increased facet effusion; left facet synovial cyst causing mild canal stenosis - Pursue inpatient rehab as per PT (CIR consult ordered)  UTI -12/30/2015 UA at [facility]-->+pyruia -12/30/15 urine culture- EColi -continue nitrofurantoin D#3 of 5  AKI on CKD stage II -Baseline creatinine 1.0-1.3 -Serial BMP - DC HCTZ, lisinopril, lasix and  celebrex. AKI work up. Monitor renal function and electrolytes. SCr has stabilized. Continue to monitor renal function and electrolytes.  Hypertension - Optimize  Lower extremity pain and edema - No DVT as per doppler ultrasound.   Disposition Plan:Will depend on hospital course and Surgical recommendation.  Family Communication: Husband and Mother updated at bedside 6/26  Consultants: Neurosurgery  Code Status: FULL   DVT Prophylaxis: [place] Lovenox   Procedures: As Listed in Progress Note Above  Antibiotics: Nitrofurantoin 01/04/16--->    Subjective: Nil new complaints. Worsening renal function noted. No fever or chills. No SOB or chest pain. No nausea or vomiting.   Objective: Filed Vitals:   01/07/16 1438 01/07/16 1452 01/07/16 2052 01/08/16 0514  BP: 96/61 124/75 99/55 112/64  Pulse: 75 114 100 94  Temp: 97.8 F (36.6 C)  99.1 F (37.3 C) 98.5 F (36.9 C)  TempSrc:   Oral Oral  Resp: 17  18 20   Height:      Weight:      SpO2: 96%  90% 93%    Intake/Output Summary (Last 24 hours) at 01/08/16 0807 Last data filed at 01/08/16 [id number]  Gross per 24 hour  Intake   2760 ml  Output   1170 ml  Net   1590 ml   Weight change:  Exam:   General:  Pt is alert, follows commands appropriately, not in acute distress  HEENT: No icterus, No thrush, No neck mass, [place]/AT  Cardiovascular: RRR, S1/S2, no rubs, no gallops  Respiratory: CTA bilaterally, no wheezing, no crackles, no rhonchi  Abdomen: Soft/+BS, non tender, non distended, no guarding  Extremities:  1 + LE edema, No lymphangitis, No petechiae, No rashes, no synovitis  Data Reviewed: I have personally reviewed following labs and imaging studies Basic Metabolic Panel:  Recent Labs Lab 01/04/16 2304 01/05/16 0716 01/06/16 1233 01/07/16 0621 01/08/16 0546  NA 139 139 137 138 135  K 4.8 4.6 4.6 3.9 3.9  CL 106 108 100* 98* 99*  CO2 24 27 28 26 27   GLUCOSE 104* 124* 100* 116* 101*  BUN 18 13 15 18  19   CREATININE 1.17* 1.27* 1.32* 1.61* 1.61*  CALCIUM 8.7* 8.6* 9.3 8.5* 8.1*  PHOS  --   --   --   --  3.9   Liver Function Tests:  Recent Labs Lab 01/05/16 0716 01/08/16 0546  AST 26  --   ALT 59*  --   ALKPHOS 50  --   BILITOT 0.8  --   PROT 6.3*  --   ALBUMIN 3.5 3.2*   No results for input(s): LIPASE, AMYLASE in the last 168 hours. No results for input(s): AMMONIA in the last 168 hours. Coagulation Profile: No results for input(s): INR, PROTIME in the last 168 hours. CBC:  Recent Labs Lab 01/04/16 2304 01/05/16 0716 01/07/16 0621  WBC 14.3* 11.3* 12.3*  NEUTROABS 12.3*  --   --   HGB 12.9 11.7* 10.6*  HCT 38.2 36.3 32.4*  MCV 89.9 90.1 89.0  PLT 155 131* 117*   Cardiac Enzymes: No results for input(s): CKTOTAL, CKMB, CKMBINDEX, TROPONINI in the last 168 hours. BNP: Invalid input(s): POCBNP CBG: No results for input(s): GLUCAP in the last 168 hours. HbA1C:  Recent Labs  01/06/16 1233  HGBA1C 5.0   Urine analysis:    Component Value Date/Time   COLORURINE YELLOW 01/07/2016 1811   APPEARANCEUR CLOUDY* 01/07/2016 1811   LABSPEC 1.018 01/07/2016 1811   PHURINE 5.0 01/07/2016 1811   GLUCOSEU NEGATIVE 01/07/2016 1811   HGBUR MODERATE* 01/07/2016 1811   BILIRUBINUR NEGATIVE 01/07/2016 1811   KETONESUR NEGATIVE 01/07/2016 1811   PROTEINUR NEGATIVE 01/07/2016 1811   NITRITE NEGATIVE 01/07/2016 1811   LEUKOCYTESUR MODERATE* 01/07/2016 1811   Sepsis Labs: @LABRCNTIP (procalcitonin:4,lacticidven:4) ) Recent Results (from the past 240 hour(s))  MRSA PCR Screening     Status: None   Collection Time: 01/05/16  4:26 PM  Result Value Ref Range Status   MRSA by PCR NEGATIVE NEGATIVE Final    Comment:        The GeneXpert MRSA Assay (FDA approved for NASAL specimens only), is one component of a comprehensive MRSA colonization surveillance program. It is not intended to diagnose MRSA infection nor to guide or monitor treatment for MRSA infections.     Surgical pcr screen     Status: None   Collection Time: 01/05/16  9:44 PM  Result Value Ref Range Status   MRSA, PCR NEGATIVE NEGATIVE Final   Staphylococcus aureus NEGATIVE NEGATIVE Final    Comment:        The Xpert SA Assay (FDA approved for NASAL specimens in patients over 78 years of age), is one component of a comprehensive surveillance program.  Test performance has been validated by [facility] for patients greater than or equal to 41 year old. It is not intended to diagnose infection nor to guide or monitor treatment.      Scheduled Meds: . acetaminophen  1,000 mg Oral Q6H  . docusate sodium  100 mg Oral BID  . enoxaparin (LOVENOX) injection  0.5 mg/kg Subcutaneous Q24H  . furosemide  80 mg Oral Daily  . gabapentin  300 mg Oral TID  . methocarbamol  750 mg Oral QID  . nitrofurantoin  100 mg Oral BID  . oxyCODONE  20 mg Oral Q12H  . senna  1 tablet Oral BID  . sodium chloride flush  3 mL Intravenous Q12H  . sodium chloride flush  3 mL Intravenous Q12H   Continuous Infusions: . sodium chloride 100 mL/hr at 01/07/16 1110  . sodium chloride      Procedures/Studies: Dg Lumbar Spine 2-3 Views  01/06/2016  CLINICAL DATA:  L4-5 PLIF. EXAM: Operative LUMBAR SPINE - 2-3 VIEW COMPARISON:  MRI lumbar spine 01/05/2016. FINDINGS: Two spot images from the C-arm fluoroscopic device, AP and lateral views of the lower lumbar spine are submitted for interpretation postoperatively. L4-5 PLIF and interbody fusion with anatomic alignment. The radiologic technologist documented 1 min of fluoroscopy time. IMPRESSION: L4-5 PLIF and interbody fusion with anatomic alignment. Electronically Signed   By: Evangeline Dakin M.D.   On: 01/06/2016 21:51   Mr Lumbar Spine Wo Contrast  01/05/2016  CLINICAL DATA:  Gradually worsening, acute on chronic constant back pain for 1 month. Pain worsening for 4 days. Reported synovial cyst of the spine. Numbness RIGHT leg. Recent falls. EXAM: MRI LUMBAR  SPINE WITHOUT CONTRAST TECHNIQUE: Multiplanar, multisequence MR imaging of the lumbar spine was performed. No intravenous contrast was administered. COMPARISON:  MRI of the lumbar spine December 26, 2015 FINDINGS: Large body habitus results in decreased signal to noise ratio. OSSEOUS STRUCTURES: Lumbar vertebral bodies are intact and aligned with maintenance of lumbar lordosis. Intervertebral discs demonstrate normal morphology and signal characteristics. No abnormal bone marrow signal. SPINAL CORD: Conus medullaris terminates at L1-2 and demonstrates normal morphology and signal characteristics. Cauda equina is normal. SOFT TISSUES: Included prevertebral and paraspinal soft tissues are normal. LEVEL BY LEVEL EVALUATION: L1-2 through L3-4:: No disc bulge, canal stenosis nor neural foraminal narrowing. L4-5: Severe facet arthropathy with increasing bilateral facet effusions, measuring up to 3 mm on the LEFT. 7 x 7 x 7 mm LEFT facet synovial cyst extending into the dorsal epidural space resulting in mild thecal sac effacement and canal stenosis. No neural foraminal narrowing. L5-S1: No disc bulge, canal stenosis nor neural foraminal narrowing. IMPRESSION: Severe L4-5 facet arthropathy with increasing facet effusions which are likely reactive. Mild L4-5 facet widening can be seen with dynamic instability. Similar 7 x 7 x 7 mm LEFT facet synovial cyst within dorsal epidural space resulting in L4-5 mild canal stenosis. No acute fracture or malalignment. Electronically Signed   By: Elon Alas M.D.   On: 01/05/2016 03:26   Dg C-arm 1-60 Min  01/06/2016  CLINICAL DATA:  L4-5 PLIF. EXAM: Operative LUMBAR SPINE - 2-3 VIEW COMPARISON:  MRI lumbar spine 01/05/2016. FINDINGS: Two spot images from the C-arm fluoroscopic device, AP and lateral views of the lower lumbar spine are submitted for interpretation postoperatively. L4-5 PLIF and interbody fusion with anatomic alignment. The radiologic technologist documented 1 min  of fluoroscopy time. IMPRESSION: L4-5 PLIF and interbody fusion with anatomic alignment. Electronically Signed   By: Evangeline Dakin M.D.   On: 01/06/2016 21:51   Dg Scoliosis Eval Complete Spine Min 6 Views  01/05/2016  CLINICAL DATA:  Lumbar spinal stenosis. EXAM: DG SCOLIOSIS EVAL COMPLETE SPINE 6+V COMPARISON:  MRI of the lumbar spine dated 01/05/2016 FINDINGS: AP and lateral images of the entire spine were obtained and demonstrate no evidence of scoliosis. There is no bone destruction fracture. There is slight accentuation of the lumbar lordosis. IMPRESSION: 1. No scoliosis. 2. Slight accentuation of the lumbar  lordosis. 3. Otherwise, normal exam. Electronically Signed   By: Lorriane Shire M.D.   On: 01/05/2016 18:47    Bonnell Public, DO  [facility] Pager [phone]  If 7PM-7AM, please contact night-coverage [email or web address] Password [email or web address] 01/08/2016, 8:07 AM   LOS: 3 days

## 2016-01-08 NOTE — Care Management Note (Signed)
Case Management Note Previous CM note initiated by Carles Collet RN, CM  Patient Details  Name: Caitlin Taylor MRN: [id number] Date of Birth: 1970-09-05  Subjective/Objective:                 Patient admitted with back pain form cyst to spine. Patient is NPO currently and states she is scheduled for surgery tomorrow. Shw would like to have RW and shower seat prior to DC. Independent PTA, lives with spouse in [place].   Action/Plan:  Anticipate PT eval after surgery, will wait for rec. Anticipate DME.  Expected Discharge Date:                  Expected Discharge Plan:  [place]  In-House Referral:     Discharge planning Services  CM Consult  Post Acute Care Choice:  Durable Medical Equipment Choice offered to:  Patient  DME Arranged:  3-N-1, Walker rolling DME Agency:  Central:    [place] Agency:     Status of Service:  In process, will continue to follow  If discussed at Long Length of Stay Meetings, dates discussed:    Additional Comments:  01/08/16- 1430- Marvetta Gibbons RN, BSN- f/u after PT/OT recommendations- Per OT no further recommendations- PT initially recommended- HHPT- then came back and recommended CIR- per note by CIR admission coordinator- pt does not meet for CIR admission- plan will be for HHPT- pt will need HH order for HHPT- CM to continue to follow for d/c needs- DME orders have been placed for RW and 3n1.   Dahlia Client Refton, RN 01/08/2016, 2:33 PM [phone]-

## 2016-01-09 DIAGNOSIS — M4806 Spinal stenosis, lumbar region: Principal | ICD-10-CM

## 2016-01-09 DIAGNOSIS — N189 Chronic kidney disease, unspecified: Secondary | ICD-10-CM

## 2016-01-09 LAB — RENAL FUNCTION PANEL
Albumin: 3.1 g/dL — ABNORMAL LOW (ref 3.5–5.0)
Anion gap: 10 (ref 5–15)
BUN: 18 mg/dL (ref 6–20)
CO2: 30 mmol/L (ref 22–32)
Calcium: 8.5 mg/dL — ABNORMAL LOW (ref 8.9–10.3)
Chloride: 96 mmol/L — ABNORMAL LOW (ref 101–111)
Creatinine, Ser: 1.42 mg/dL — ABNORMAL HIGH (ref 0.44–1.00)
GFR calc Af Amer: 51 mL/min — ABNORMAL LOW (ref >60)
GFR calc non Af Amer: 44 mL/min — ABNORMAL LOW (ref >60)
Glucose, Bld: 95 mg/dL (ref 65–99)
Phosphorus: 2.7 mg/dL (ref 2.5–4.6)
Potassium: 3.8 mmol/L (ref 3.5–5.1)
Sodium: 136 mmol/L (ref 135–145)

## 2016-01-09 NOTE — Progress Notes (Signed)
SNF offers provided to patient- CIR availability/insurance still pending  Pt undecided about SNF choice if CIR not an option and continues to be very hopeful for CIR admission  CSW will continue to follow  Domenica Reamer, [place] Social Worker [phone]

## 2016-01-09 NOTE — Progress Notes (Signed)
PROGRESS NOTE  Caitlin Taylor [id number] DOB: 10/11/1970 DOA: 01/04/2016 PCP: Manfred Shirts, PA  Brief History:  45 year old female with a history of impaired glucose tolerance, hypertension, and chronic back pain presented with 4 day history of increasing bilateral lower extremity weakness and numbness. The patient states that she has had back pain for the past 2 years but it has worsened since February 2017. The patient had a mechanical fall on 12/26/2015. She presented to ED at [facility]. The patient was discharged with prednisone which she finished on 01/03/2016. The patient represented to the ED at [facility] on 12/30/2015 and was diagnosed with UTI and started on nitrofurantoin. In the interim, the patient complains of increase right foot and leg weakness with numbness and tingling that has worsened to encompass her left lower extremity up to her knee. She has resorted to using a walker to ambulate. Upon presentation. MRI of the lumbar spine showed severe L4-5 facet arthropathy with increased facet effusions. There was also a left facet synovial cyst resulting in L4-5 mild canal stenosis. Neurosurgery was consulted in the emergency department. Previous medical records reviewed and summarized.  Assessment/Plan: Lumbar radiculopathy - S/P L4-5 posterior lumbar interbody fusion with titanium interbody cages and cortical trajectory pedicle screws at L4 and L5 bilaterally, use of morsellized allograft on 01/06/16 -Post op management as per Ortho team -01/05/2016 MRI lumbar spine--severe L4-5 facet arthropathy with increased facet effusion; left facet synovial cyst causing mild canal stenosis - Pursue inpatient rehab as per PT (CIR consult ordered)  UTI -12/30/2015 UA at [facility]-->+pyruia -12/30/15 urine culture- EColi -continue nitrofurantoin D#3 of 5  AKI on CKD stage II -Baseline creatinine 1.0-1.3. AKI is improving. Stable for discharge to rehab  facility. -Continue to hold nephrotoxics (DC HCTZ, lisinopril and celebrex). Monitor renal function and electrolytes. SCr is improving. Continue to monitor renal function and electrolytes.  Hypertension - Optimize  Lower extremity pain and edema - No DVT as per doppler ultrasound.   Disposition Plan:Will depend on hospital course and Surgical recommendation.  Family Communication: Husband and Mother updated at bedside 6/26  Consultants: Neurosurgery  Code Status: FULL   DVT Prophylaxis: [place] Lovenox   Procedures: As Listed in Progress Note Above  Antibiotics: Nitrofurantoin 01/04/16--->    Subjective: Nil new complaints. Worsening renal function noted. No fever or chills. No SOB or chest pain. No nausea or vomiting.   Objective: Filed Vitals:   01/08/16 0514 01/08/16 1503 01/08/16 2259 01/09/16 0524  BP: 112/64 116/62 135/75 133/61  Pulse: 94 109 98 92  Temp: 98.5 F (36.9 C) 98.9 F (37.2 C) 99.2 F (37.3 C) 97.9 F (36.6 C)  TempSrc: Oral  Oral Oral  Resp: 20 18 16 13   Height:      Weight:      SpO2: 93% 91% 99% 100%    Intake/Output Summary (Last 24 hours) at 01/09/16 1120 Last data filed at 01/09/16 0842  Gross per 24 hour  Intake    483 ml  Output   1565 ml  Net  -1082 ml   Weight change:  Exam:   General:  Pt is alert, follows commands appropriately, not in acute distress  HEENT: No icterus, No thrush, No neck mass, [place]/AT  Cardiovascular: RRR, S1/S2, no rubs, no gallops  Respiratory: CTA bilaterally, no wheezing, no crackles, no rhonchi  Abdomen: Soft/+BS, non tender, non distended, no guarding  Extremities:  1 + LE  edema, No lymphangitis, No petechiae, No rashes, no synovitis   Data Reviewed: I have personally reviewed following labs and imaging studies Basic Metabolic Panel:  Recent Labs Lab 01/05/16 0716 01/06/16 1233 01/07/16 0621 01/08/16 0546 01/09/16 0607  Caitlin 139 137 138 135 136  K 4.6 4.6 3.9 3.9 3.8  CL 108 100* 98*  99* 96*  CO2 27 28 26 27 30   GLUCOSE 124* 100* 116* 101* 95  BUN 13 15 18 19 18   CREATININE 1.27* 1.32* 1.61* 1.61* 1.42*  CALCIUM 8.6* 9.3 8.5* 8.1* 8.5*  PHOS  --   --   --  3.9 2.7   Liver Function Tests:  Recent Labs Lab 01/05/16 0716 01/08/16 0546 01/09/16 0607  AST 26  --   --   ALT 59*  --   --   ALKPHOS 50  --   --   BILITOT 0.8  --   --   PROT 6.3*  --   --   ALBUMIN 3.5 3.2* 3.1*   No results for input(s): LIPASE, AMYLASE in the last 168 hours. No results for input(s): AMMONIA in the last 168 hours. Coagulation Profile: No results for input(s): INR, PROTIME in the last 168 hours. CBC:  Recent Labs Lab 01/04/16 2304 01/05/16 0716 01/07/16 0621  WBC 14.3* 11.3* 12.3*  NEUTROABS 12.3*  --   --   HGB 12.9 11.7* 10.6*  HCT 38.2 36.3 32.4*  MCV 89.9 90.1 89.0  PLT 155 131* 117*   Cardiac Enzymes: No results for input(s): CKTOTAL, CKMB, CKMBINDEX, TROPONINI in the last 168 hours. BNP: Invalid input(s): POCBNP CBG: No results for input(s): GLUCAP in the last 168 hours. HbA1C:  Recent Labs  01/06/16 1233  HGBA1C 5.0   Urine analysis:    Component Value Date/Time   COLORURINE YELLOW 01/07/2016 1811   APPEARANCEUR CLOUDY* 01/07/2016 1811   LABSPEC 1.018 01/07/2016 1811   PHURINE 5.0 01/07/2016 1811   GLUCOSEU NEGATIVE 01/07/2016 1811   HGBUR MODERATE* 01/07/2016 1811   BILIRUBINUR NEGATIVE 01/07/2016 1811   KETONESUR NEGATIVE 01/07/2016 1811   PROTEINUR NEGATIVE 01/07/2016 1811   NITRITE NEGATIVE 01/07/2016 1811   LEUKOCYTESUR MODERATE* 01/07/2016 1811   Sepsis Labs: @LABRCNTIP (procalcitonin:4,lacticidven:4) ) Recent Results (from the past 240 hour(s))  MRSA PCR Screening     Status: None   Collection Time: 01/05/16  4:26 PM  Result Value Ref Range Status   MRSA by PCR NEGATIVE NEGATIVE Final    Comment:        The GeneXpert MRSA Assay (FDA approved for NASAL specimens only), is one component of a comprehensive MRSA  colonization surveillance program. It is not intended to diagnose MRSA infection nor to guide or monitor treatment for MRSA infections.   Surgical pcr screen     Status: None   Collection Time: 01/05/16  9:44 PM  Result Value Ref Range Status   MRSA, PCR NEGATIVE NEGATIVE Final   Staphylococcus aureus NEGATIVE NEGATIVE Final    Comment:        The Xpert SA Assay (FDA approved for NASAL specimens in patients over 44 years of age), is one component of a comprehensive surveillance program.  Test performance has been validated by [facility] for patients greater than or equal to 38 year old. It is not intended to diagnose infection nor to guide or monitor treatment.      Scheduled Meds: . acetaminophen  1,000 mg Oral Q6H  . docusate sodium  100 mg Oral BID  . enoxaparin (LOVENOX)  injection  0.5 mg/kg Subcutaneous Q24H  . furosemide  80 mg Oral Daily  . gabapentin  300 mg Oral TID  . methocarbamol  750 mg Oral QID  . nitrofurantoin  100 mg Oral BID  . oxyCODONE  20 mg Oral Q12H  . senna  1 tablet Oral BID  . sodium chloride flush  3 mL Intravenous Q12H  . sodium chloride flush  3 mL Intravenous Q12H   Continuous Infusions: . sodium chloride 100 mL/hr at 01/07/16 1110  . sodium chloride      Procedures/Studies: Dg Lumbar Spine 2-3 Views  01/06/2016  CLINICAL DATA:  L4-5 PLIF. EXAM: Operative LUMBAR SPINE - 2-3 VIEW COMPARISON:  MRI lumbar spine 01/05/2016. FINDINGS: Two spot images from the C-arm fluoroscopic device, AP and lateral views of the lower lumbar spine are submitted for interpretation postoperatively. L4-5 PLIF and interbody fusion with anatomic alignment. The radiologic technologist documented 1 min of fluoroscopy time. IMPRESSION: L4-5 PLIF and interbody fusion with anatomic alignment. Electronically Signed   By: Evangeline Dakin M.D.   On: 01/06/2016 21:51   Mr Lumbar Spine Wo Contrast  01/05/2016  CLINICAL DATA:  Gradually worsening, acute on chronic  constant back pain for 1 month. Pain worsening for 4 days. Reported synovial cyst of the spine. Numbness RIGHT leg. Recent falls. EXAM: MRI LUMBAR SPINE WITHOUT CONTRAST TECHNIQUE: Multiplanar, multisequence MR imaging of the lumbar spine was performed. No intravenous contrast was administered. COMPARISON:  MRI of the lumbar spine December 26, 2015 FINDINGS: Large body habitus results in decreased signal to noise ratio. OSSEOUS STRUCTURES: Lumbar vertebral bodies are intact and aligned with maintenance of lumbar lordosis. Intervertebral discs demonstrate normal morphology and signal characteristics. No abnormal bone marrow signal. SPINAL CORD: Conus medullaris terminates at L1-2 and demonstrates normal morphology and signal characteristics. Cauda equina is normal. SOFT TISSUES: Included prevertebral and paraspinal soft tissues are normal. LEVEL BY LEVEL EVALUATION: L1-2 through L3-4:: No disc bulge, canal stenosis nor neural foraminal narrowing. L4-5: Severe facet arthropathy with increasing bilateral facet effusions, measuring up to 3 mm on the LEFT. 7 x 7 x 7 mm LEFT facet synovial cyst extending into the dorsal epidural space resulting in mild thecal sac effacement and canal stenosis. No neural foraminal narrowing. L5-S1: No disc bulge, canal stenosis nor neural foraminal narrowing. IMPRESSION: Severe L4-5 facet arthropathy with increasing facet effusions which are likely reactive. Mild L4-5 facet widening can be seen with dynamic instability. Similar 7 x 7 x 7 mm LEFT facet synovial cyst within dorsal epidural space resulting in L4-5 mild canal stenosis. No acute fracture or malalignment. Electronically Signed   By: Elon Alas M.D.   On: 01/05/2016 03:26   Dg C-arm 1-60 Min  01/06/2016  CLINICAL DATA:  L4-5 PLIF. EXAM: Operative LUMBAR SPINE - 2-3 VIEW COMPARISON:  MRI lumbar spine 01/05/2016. FINDINGS: Two spot images from the C-arm fluoroscopic device, AP and lateral views of the lower lumbar spine are  submitted for interpretation postoperatively. L4-5 PLIF and interbody fusion with anatomic alignment. The radiologic technologist documented 1 min of fluoroscopy time. IMPRESSION: L4-5 PLIF and interbody fusion with anatomic alignment. Electronically Signed   By: Evangeline Dakin M.D.   On: 01/06/2016 21:51   Dg Scoliosis Eval Complete Spine Min 6 Views  01/05/2016  CLINICAL DATA:  Lumbar spinal stenosis. EXAM: DG SCOLIOSIS EVAL COMPLETE SPINE 6+V COMPARISON:  MRI of the lumbar spine dated 01/05/2016 FINDINGS: AP and lateral images of the entire spine were obtained and demonstrate no evidence  of scoliosis. There is no bone destruction fracture. There is slight accentuation of the lumbar lordosis. IMPRESSION: 1. No scoliosis. 2. Slight accentuation of the lumbar lordosis. 3. Otherwise, normal exam. Electronically Signed   By: Lorriane Shire M.D.   On: 01/05/2016 18:47    Bonnell Public, DO  [facility] Pager [phone]  If 7PM-7AM, please contact night-coverage [email or web address] Password [email or web address] 01/09/2016, 11:20 AM   LOS: 4 days

## 2016-01-09 NOTE — NC FL2 (Signed)
[place] LEVEL OF CARE SCREENING TOOL     IDENTIFICATION  Patient Name: Caitlin Taylor Birthdate: 27-May-1971 Sex: female Admission Date (Current Location): 01/04/2016  [facility] and [place] Number:  Herbalist and Address:  [facility] [place], [place] [address] [place], [place] [address]      Provider Number: [id number]  Attending Physician Name and Address:  Bonnell Public, MD  Relative Name and Phone Number:       Current Level of Care: Hospital Recommended Level of Care: [place] Prior Approval Number:    Date Approved/Denied:   PASRR Number: [id number]  Discharge Plan: SNF    Current Diagnoses: Patient Active Problem List   Diagnosis Date Noted  . Back pain 01/05/2016  . Renal insufficiency 01/05/2016  . Hyperglycemia 01/05/2016  . Lumbar radiculopathy 01/05/2016  . UTI (urinary tract infection) 01/05/2016  . CKD (chronic kidney disease) stage 2, GFR 60-89 ml/min 01/05/2016  . Essential hypertension 01/05/2016    Orientation RESPIRATION BLADDER Height & Weight     Self, Time, Situation, Place  Normal Continent Weight: 122.108 kg (269 lb 3.2 oz) Height:  5' 3.6" (161.5 cm)  BEHAVIORAL SYMPTOMS/MOOD NEUROLOGICAL BOWEL NUTRITION STATUS      Continent Diet (see DC)  AMBULATORY STATUS COMMUNICATION OF NEEDS Skin   Limited Assist Verbally Surgical wounds                       Personal Care Assistance Level of Assistance  Bathing, Dressing Bathing Assistance: Limited assistance   Dressing Assistance: Limited assistance     Functional Limitations Info             SPECIAL CARE FACTORS FREQUENCY  PT (By licensed PT), OT (By licensed OT)     PT Frequency: 5/wk OT Frequency: 5/wk            Contractures      Additional Factors Info  Code Status, Allergies Code Status Info: FULL Allergies Info: NKA           Current Medications (01/09/2016):  This is the current hospital  active medication list Current Facility-Administered Medications  Medication Dose Route Frequency Provider Last Rate Last Dose  . 0.9 %  sodium chloride infusion   Intravenous Continuous Kevan Ny Ditty, MD 100 mL/hr at 01/07/16 1110    . 0.9 %  sodium chloride infusion  250 mL Intravenous Continuous Kevan Ny Ditty, MD      . acetaminophen (TYLENOL) tablet 650 mg  650 mg Oral Q6H PRN Jani Gravel, MD       Or  . acetaminophen (TYLENOL) suppository 650 mg  650 mg Rectal Q6H PRN Jani Gravel, MD      . acetaminophen (TYLENOL) tablet 1,000 mg  1,000 mg Oral Q6H Kevan Ny Ditty, MD   1,000 mg at 01/09/16 0531  . bisacodyl (DULCOLAX) EC tablet 5 mg  5 mg Oral Daily PRN Kevan Ny Ditty, MD      . diphenhydrAMINE (BENADRYL) capsule 25 mg  25 mg Oral Q6H PRN Jani Gravel, MD      . docusate sodium (COLACE) capsule 100 mg  100 mg Oral BID Kevan Ny Ditty, MD   100 mg at 01/09/16 0841  . enoxaparin (LOVENOX) injection 60 mg  0.5 mg/kg Subcutaneous Q24H Bonnell Public, MD   60 mg at 01/09/16 0836  . furosemide (LASIX) tablet 80 mg  80 mg Oral Daily Jani Gravel, MD  80 mg at 01/09/16 0839  . gabapentin (NEURONTIN) capsule 300 mg  300 mg Oral TID Kevan Ny Ditty, MD   300 mg at 01/09/16 0841  . menthol-cetylpyridinium (CEPACOL) lozenge 3 mg  1 lozenge Oral PRN Kevan Ny Ditty, MD       Or  . phenol (CHLORASEPTIC) mouth spray 1 spray  1 spray Mouth/Throat PRN Kevan Ny Ditty, MD      . methocarbamol (ROBAXIN) tablet 750 mg  750 mg Oral QID Kevan Ny Ditty, MD   750 mg at 01/09/16 0841  . nitrofurantoin (MACRODANTIN) capsule 100 mg  100 mg Oral BID Jani Gravel, MD   100 mg at 01/09/16 0856  . ondansetron (ZOFRAN) injection 4 mg  4 mg Intravenous Q4H PRN Kevan Ny Ditty, MD   4 mg at 01/08/16 0859  . oxyCODONE (Oxy IR/ROXICODONE) immediate release tablet 5 mg  5 mg Oral Q3H PRN Kevan Ny Ditty, MD      . oxyCODONE (OXYCONTIN) 12 hr tablet 20 mg  20 mg Oral Q12H  Kevan Ny Ditty, MD   20 mg at 01/09/16 0839  . senna (SENOKOT) tablet 8.6 mg  1 tablet Oral BID Kevan Ny Ditty, MD   8.6 mg at 01/09/16 0840  . sodium chloride flush (NS) 0.9 % injection 3 mL  3 mL Intravenous Q12H Jani Gravel, MD   3 mL at 01/09/16 1000  . sodium chloride flush (NS) 0.9 % injection 3 mL  3 mL Intravenous Q12H Kevan Ny Ditty, MD   3 mL at 01/09/16 0837  . sodium chloride flush (NS) 0.9 % injection 3 mL  3 mL Intravenous PRN Kevan Ny Ditty, MD      . sodium phosphate (FLEET) 7-19 GM/118ML enema 1 enema  1 enema Rectal Once PRN Kevan Ny Ditty, MD      . zolpidem [facility]) tablet 5 mg  5 mg Oral QHS PRN Kevan Ny Ditty, MD   5 mg at 01/07/16 0139     Discharge Medications: Please see discharge summary for a list of discharge medications.  Relevant Imaging Results:  Relevant Lab Results:   Additional Information SS#: [id number]  Cranford Mon, [place]

## 2016-01-09 NOTE — Clinical Social Work Note (Signed)
Clinical Social Work Assessment  Patient Details  Name: Caitlin Taylor MRN: [id number] Date of Birth: 27-Jun-1971  Date of referral:  01/09/16               Reason for consult:  Facility Placement                Permission sought to share information with:  Family Supports Permission granted to share information::  Yes, Verbal Permission Granted  Name::     [place]::  [facility] SN[facility]  Relationship::  spouse  Contact Information:     Housing/Transportation Living arrangements for the past 2 months:  Single Family Home Source of Information:  Patient Patient Interpreter Needed:  None Criminal Activity/Legal Involvement Pertinent to Current Situation/Hospitalization:  No - Comment as needed Significant Relationships:  Spouse, Parents Lives with:  Spouse (usually lives with spouse and children but currently living with elderly mom) Do you feel safe going back to the place where you live?  No (not at current level of functioning) Need for family participation in patient care:  No (Coment)  Care giving concerns: pt currently living with elderly mom who is unable to provide physical assist.   Facilities manager / plan:  CSW spoke with pt concerning PT recommendation for CIR at time of DC.  CSW explained CIR vs SNF rehab programs and need for safe DC plan in case CIR unable to admit.  Pt states she normally lives with spouse and minor children but she has been living with her mom as a caregiver for several weeks.  Pt confirms her mom is capable of completing ADLs and getting appropriate care with help of other supportive family while pt is admitted to hospital/rehab program.   Employment status:    Insurance information:  Managed Care PT Recommendations:  Inpatient Rehab Consult Information / Referral to community resources:  [place]  Patient/Family's Response to care:  Pt is agreeable to SNF if CIR unable to admit but feels very motivated to go  to CIR and participate in their more intensive rehab program.  Patient/Family's Understanding of and Emotional Response to Diagnosis, Current Treatment, and Prognosis:  No questions or concerns- hopeful to make recovery and stop having chronic back pain as result of this procedure.  Emotional Assessment Appearance:  Appears stated age Attitude/Demeanor/Rapport:    Affect (typically observed):  Appropriate, Accepting, Pleasant Orientation:  Oriented to Situation, Oriented to  Time, Oriented to Place, Oriented to Self Alcohol / Substance use:  Not Applicable Psych involvement (Current and /or in the community):  No (Comment)  Discharge Needs  Concerns to be addressed:  Care Coordination, Discharge Planning Concerns Readmission within the last 30 days:    Current discharge risk:  Physical Impairment Barriers to Discharge:  Continued Medical Work up   [employer], LCSW 01/09/2016, 1:47 PM

## 2016-01-09 NOTE — Consult Note (Addendum)
Physical Medicine and Rehabilitation Consult   Reason for Consult:  L4/5 stenosis due to facet synovial cyst with myelopathy. Referring Physician: Dr. Marthenia Rolling   HPI: Caitlin Taylor is a 45 y.o. female with history of morbid obesity, HTN, back pain X 2 years with RLE weakness and foot drop and treated with steroids and NSAIDS in the past.  She was admitted via ED on 01/04/16 with 1-2 week history of  LLE weakness and numbness.  MRI spine showed severe L4/5 facet arthropathy with increasing effusions and mild widening/instability and stable facet synovial cyst. She was evaluated by Dr. Cyndy Freeze who felt that bilateral synovial cysts were causing severe stenosis and patient underwent L4/5 nerve root decompression with fusion on 01/06/16.  Post op continues to be limted by BLE pain, BLE weakness with proprioceptive deficits and has difficulty with mobility. PT/OT evaluations done yesterday and CIR recommended for follow up therapy.    Review of Systems  HENT: Negative for hearing loss.   Eyes: Negative for blurred vision and double vision.  Respiratory: Positive for shortness of breath (with activity). Negative for cough and stridor.   Cardiovascular: Positive for leg swelling (chronic edema for years).  Gastrointestinal: Positive for constipation. Negative for heartburn, nausea and abdominal pain.  Genitourinary: Negative for dysuria and urgency.  Musculoskeletal: Positive for myalgias and back pain.  Skin: Negative for itching and rash.  Neurological: Positive for tremors (BUE since surgery), sensory change, focal weakness and weakness. Negative for dizziness, tingling and headaches.  Psychiatric/Behavioral: Negative for depression. The patient is nervous/anxious (concerned about weakness and slow progress. ).       Past Medical History  Diagnosis Date  . Chronic back pain   . Hypertension     Past Surgical History  Procedure Laterality Date  . Cesarean section    . Maximum  access (mas)posterior lumbar interbody fusion (plif) 1 level N/A 01/06/2016    Procedure: FOR MAXIMUM ACCESS (MAS) POSTERIOR LUMBAR INTERBODY FUSION (PLIF) LUMBAR FOUR-FIVE;  Surgeon: Kevan Ny Ditty, MD;  Location: [facility];  Service: Neurosurgery;  Laterality: N/A;    Family History  Problem Relation Age of Onset  . Congestive Heart Failure Father     Social History:  Married. 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 Has been living with mother and helping her for the past year.  Has husband and children in town.  She reports that she has never smoked. She does not have any smokeless tobacco history on file. She reports that she does not drink alcohol or use illicit drugs.    Allergies: No Known Allergies    Medications Prior to Admission  Medication Sig Dispense Refill  . Cyanocobalamin (VITAMIN B-12 PO) Take 1 tablet by mouth daily.    . cyclobenzaprine (FLEXERIL) 10 MG tablet Take 10 mg by mouth 2 (two) times daily.    . furosemide (LASIX) 20 MG tablet Take 80 mg by mouth daily.     Marland Kitchen gabapentin (NEURONTIN) 300 MG capsule Take 300 mg by mouth at bedtime.    Marland Kitchen lisinopril-hydrochlorothiazide (PRINZIDE,ZESTORETIC) 20-25 MG tablet Take 1 tablet by mouth daily.    . nitrofurantoin (MACRODANTIN) 100 MG capsule Take 100 mg by mouth 2 (two) times daily.     . potassium chloride SA (K-DUR,KLOR-CON) 20 MEQ tablet Take 20 mEq by mouth 2 (two) times daily.      Home: Home Living Family/patient expects to be discharged to:: Private residence (planning to  go stay with her mother) Living Arrangements: Spouse/significant other, Children (71 y.o twins) Available Help at Discharge: Family, Available 24 hours/day (mother) Type of Home: House Home Access: Stairs to enter Technical brewer of Steps: 1 Entrance Stairs-Rails: None Home Layout: One level Bathroom Shower/Tub: Chiropodist: Standard Home Equipment: Hand held shower head  Functional  History: Prior Function Level of Independence: Needs assistance Gait / Transfers Assistance Needed: for a week and a half she has been using her mom's RW.  Mom uses RW at times too ADL's / Homemaking Assistance Needed: Assist with LB ADLs and IADLs for past 2 weeks Functional Status:  Mobility: Bed Mobility Overal bed mobility: Needs Assistance Bed Mobility: Sidelying to Sit Rolling: Min assist Sidelying to sit: Min assist Sit to sidelying: Mod assist General bed mobility comments: Assist for trunk support to come to sitting position. VCs for log roll technique. Transfers Overall transfer level: Needs assistance Equipment used: Rolling walker (2 wheeled) Transfers: Sit to/from Stand Sit to Stand: Min assist Stand pivot transfers: +2 safety/equipment, Min assist General transfer comment: Assist for boost to stand and to stabilize balance upon standing. VCs for safe hand placement and for back precautions. Ambulation/Gait Ambulation/Gait assistance: Mod assist Ambulation Distance (Feet): 60 Feet Assistive device: Rolling walker (2 wheeled) Gait Pattern/deviations: Step-to pattern, Steppage, Wide base of support, Ataxic, Decreased stride length, Trendelenburg General Gait Details: weakness in LE's, reports legs and arms feel really heavy; several stops to rest in hallway with HR up to 123 BP 177/57 RR 40.   Gait velocity: NT, pt limited by pain.      ADL: ADL Overall ADL's : Needs assistance/impaired Grooming: Wash/dry hands, Wash/dry face, Set up, Sitting Upper Body Bathing: Set up, Sitting Lower Body Bathing: Moderate assistance, Sit to/from stand Upper Body Dressing : Minimal assistance, Sitting Upper Body Dressing Details (indicate cue type and reason): to don/doff back brace Lower Body Dressing: Moderate assistance, Sit to/from stand Lower Body Dressing Details (indicate cue type and reason): unable to cross ankle-over-knee Toilet Transfer: Minimal assistance, Cueing for  safety, Ambulation, BSC, RW Toilet Transfer Details (indicate cue type and reason): simulated to chair Toileting- Clothing Manipulation and Hygiene: Minimal assistance, Sit to/from stand Toileting - Clothing Manipulation Details (indicate cue type and reason): educated on use of wet wipes Functional mobility during ADLs: Minimal assistance, Rolling walker General ADL Comments: Session limited as pt's dressing was soaked and draining was leaking significantly.   Cognition: Cognition Overall Cognitive Status: Within Functional Limits for tasks assessed Orientation Level: Oriented X4 Cognition Arousal/Alertness: Awake/alert Behavior During Therapy: WFL for tasks assessed/performed Overall Cognitive Status: Within Functional Limits for tasks assessed   Blood pressure 133/61, pulse 92, temperature 97.9 F (36.6 C), temperature source Oral, resp. rate 13, height 5' 3.6" (1.615 m), weight 122.108 kg (269 lb 3.2 oz), last menstrual period 12/28/2015, SpO2 100 %. Physical Exam  Nursing note and vitals reviewed. Constitutional: She is oriented to person, place, and time. She appears well-developed and well-nourished.  Obese female. NAD  HENT:  Head: Atraumatic.  Mouth/Throat: Oropharynx is clear and moist.  Eyes: Conjunctivae and EOM are normal. Pupils are equal, round, and reactive to light. No scleral icterus.  Neck: Neck supple.  Cardiovascular: Normal rate and regular rhythm.   No murmur heard. Respiratory: Effort normal and breath sounds normal. No stridor. No respiratory distress. She has no wheezes.  GI: Soft. Bowel sounds are normal. She exhibits no distension. There is no tenderness.  Musculoskeletal: She exhibits edema.  1-2+ edema BLE  Neurological: She is alert and oriented to person, place, and time. She displays abnormal reflex. Coordination abnormal.  Speech clear. Follows commands without difficulty. Sensory deficits BLE with weakness LLE> RLE. RLE: HF 4/5, KE 3+, ADF/PF  1-2/5. LLE: HF 4/5, KE 4, ADF/PF 3/5. Decreased sensation to LT over the anterolateral right leg/foot  Skin: Skin is warm and dry. No rash noted.  Psychiatric: She has a normal mood and affect. Her behavior is normal. Judgment and thought content normal.    Results for orders placed or performed during the hospital encounter of 01/04/16 (from the past 24 hour(s))  Renal function panel     Status: Abnormal   Collection Time: 01/09/16  6:07 AM  Result Value Ref Range   Sodium 136 135 - 145 mmol/L   Potassium 3.8 3.5 - 5.1 mmol/L   Chloride 96 (L) 101 - 111 mmol/L   CO2 30 22 - 32 mmol/L   Glucose, Bld 95 65 - 99 mg/dL   BUN 18 6 - 20 mg/dL   Creatinine, Ser 1.42 (H) 0.44 - 1.00 mg/dL   Calcium 8.5 (L) 8.9 - 10.3 mg/dL   Phosphorus 2.7 2.5 - 4.6 mg/dL   Albumin 3.1 (L) 3.5 - 5.0 g/dL   GFR calc non Af Amer 44 (L) >60 mL/min   GFR calc Af Amer 51 (L) >60 mL/min   Anion gap 10 5 - 15   No results found.  Assessment/Plan: Diagnosis: L4-5 stenosis with myelopathy s/p decompression and fusion. Pt with residual lower extremity weakness and sensory deficits 1. Does the need for close, 24 hr/day medical supervision in concert with the patient's rehab needs make it unreasonable for this patient to be served in a less intensive setting? Yes 2. Co-Morbidities requiring supervision/potential complications: CKD, lumbar radiculopathy, morbid obesity 3. Due to bladder management, bowel management, safety, skin/wound care, disease management, medication administration, pain management and patient education, does the patient require 24 hr/day rehab nursing? Yes 4. Does the patient require coordinated care of a physician, rehab nurse, PT (1-2 hrs/day, 5 days/week) and OT (1-2 hrs/day, 5 days/week) to address physical and functional deficits in the context of the above medical diagnosis(es)? Yes Addressing deficits in the following areas: balance, endurance, locomotion, strength, transferring, bowel/bladder  control, bathing, dressing, feeding, grooming, toileting and psychosocial support 5. Can the patient actively participate in an intensive therapy program of at least 3 hrs of therapy per day at least 5 days per week? Yes 6. The potential for patient to make measurable gains while on inpatient rehab is excellent 7. Anticipated functional outcomes upon discharge from inpatient rehab are modified independent  with PT, modified independent with OT, n/a with SLP. 8. Estimated rehab length of stay to reach the above functional goals is: 10-13 days 9. Does the patient have adequate social supports and living environment to accommodate these discharge functional goals? Yes 10. Anticipated D/C setting: Home 11. Anticipated post D/C treatments: [place] therapy 12. Overall Rehab/Functional Prognosis: excellent  RECOMMENDATIONS: This patient's condition is appropriate for continued rehabilitative care in the following setting: CIR Patient has agreed to participate in recommended program. Yes Note that insurance prior authorization may be required for reimbursement for recommended care.  Comment: Rehab Admissions Coordinator to follow up.  Thanks,  Meredith Staggers, MD, Mellody Drown     01/09/2016

## 2016-01-09 NOTE — Progress Notes (Signed)
Physical Therapy Treatment Patient Details Name: Caitlin Taylor MRN: [id number] DOB: 1971/01/27 Today's Date: 01/09/2016    History of Present Illness 45 y.o. female s/p L4-5 PLIF. PMH significant for chronic back pain, R foot drop, and HTN.    PT Comments    Patient is gradually progressing toward mobility goals. Limited by elevated HR up to 138 with ambulation. Pt is very motivated to participate in therapy and return to independence. Current plan remains appropriate.   Follow Up Recommendations  CIR     Equipment Recommendations  Rolling walker with 5" wheels;3in1 (PT)    Recommendations for Other Services Rehab consult     Precautions / Restrictions Precautions Precautions: Back;Fall Precaution Booklet Issued: Yes (comment) Precaution Comments: able to recall 3/3 precautions Required Braces or Orthoses: Spinal Brace Spinal Brace: Applied in sitting position;Lumbar corset Restrictions Weight Bearing Restrictions: No    Mobility  Bed Mobility               General bed mobility comments: OOB in chair upon arrival  Transfers Overall transfer level: Needs assistance Equipment used: Rolling walker (2 wheeled) Transfers: Sit to/from Stand Sit to Stand: Min guard         General transfer comment: min guard for safety; cues for hand placement with carry over demonstrated  Ambulation/Gait Ambulation/Gait assistance: Min guard Ambulation Distance (Feet): 70 Feet Assistive device: Rolling walker (2 wheeled) Gait Pattern/deviations: Step-through pattern;Decreased stride length;Wide base of support;Trendelenburg     General Gait Details: pt with weakness in bilat LE (R > L); cues for posture and increased step length; pt with DOE and HR increased to 138; one seated rest break; HR 120 upon end of session after rest--RN aware   Stairs            Wheelchair Mobility    Modified Rankin (Stroke Patients Only)       Balance                                    Cognition Arousal/Alertness: Awake/alert Behavior During Therapy: WFL for tasks assessed/performed Overall Cognitive Status: Within Functional Limits for tasks assessed                      Exercises      General Comments General comments (skin integrity, edema, etc.): pt reported that her HR was high when she ambulated yesterday      Pertinent Vitals/Pain Pain Assessment: 0-10 Pain Score: 6  Pain Location: back Pain Descriptors / Indicators: Aching;Guarding;Sore Pain Intervention(s): Limited activity within patient's tolerance;Monitored during session;Repositioned    Home Living                      Prior Function            PT Goals (current goals can now be found in the care plan section) Acute Rehab PT Goals Patient Stated Goal: to be able to walk normally again Progress towards PT goals: Progressing toward goals    Frequency  Min 5X/week    PT Plan Current plan remains appropriate    Co-evaluation             End of Session Equipment Utilized During Treatment: Back brace Activity Tolerance: Treatment limited secondary to medical complications (Comment) (elevated HR) Patient left: in chair;with call bell/phone within reach;with family/visitor present     Time: [id number] PT Time Calculation (  min) (ACUTE ONLY): 26 min  Charges:  $Gait Training: 8-22 mins $Therapeutic Activity: 8-22 mins                    G Codes:      Salina April, PTA Pager: [phone]   01/09/2016, 3:34 PM

## 2016-01-09 NOTE — Progress Notes (Signed)
Inpatient Rehabilitation  Received call from [facility] that there will not be a determination until Monday, 7/3.  Notified CSW and patient.  If patient remains in house my co-worker Gerlean Ren will follow up Monday.  Caitlin Taylor., CCC/SLP Admission Coordinator  [place]  Cell [phone]

## 2016-01-09 NOTE — Progress Notes (Signed)
Inpatient Rehabilitation  Met with patient to discuss team's recommendation for IP Rehab.  I shared booklets and answered questions.  Insurance authorization for admission has been initiated.  Updated CSW.  Plan to continue to update team as I know.  Please call with questions.    Carmelia Roller., CCC/SLP Admission Coordinator  [place]  Cell [phone]

## 2016-01-10 DIAGNOSIS — M545 Low back pain: Secondary | ICD-10-CM

## 2016-01-10 LAB — BASIC METABOLIC PANEL
Anion gap: 9 (ref 5–15)
BUN: 14 mg/dL (ref 6–20)
CO2: 29 mmol/L (ref 22–32)
Calcium: 8.5 mg/dL — ABNORMAL LOW (ref 8.9–10.3)
Chloride: 98 mmol/L — ABNORMAL LOW (ref 101–111)
Creatinine, Ser: 1.28 mg/dL — ABNORMAL HIGH (ref 0.44–1.00)
GFR calc Af Amer: 58 mL/min — ABNORMAL LOW (ref >60)
GFR calc non Af Amer: 50 mL/min — ABNORMAL LOW (ref >60)
Glucose, Bld: 179 mg/dL — ABNORMAL HIGH (ref 65–99)
Potassium: 3.5 mmol/L (ref 3.5–5.1)
Sodium: 136 mmol/L (ref 135–145)

## 2016-01-10 MED ORDER — PANTOPRAZOLE SODIUM 40 MG PO TBEC
40.0000 mg | DELAYED_RELEASE_TABLET | Freq: Every day | ORAL | Status: DC
  Administered 2016-01-10 – 2016-01-12 (×3): 40 mg via ORAL
  Filled 2016-01-10 (×3): qty 1

## 2016-01-10 NOTE — Progress Notes (Addendum)
Pt called at 10:05pm to inform me that she had fallen on the floor. I went to pt's room to assess her. She said while she was going to the rest room, she reached out to some cups on the side table and her legs gave out and she fell flat on her buttocks. Pt was assessed for bruisings, no new bruising or pain at the time of assessment. CN and MD made aware. Pt said there was no need to call any family member. Neuro checks completed. Pt's Bil LE were cold to touch. MD coverage, Donnal Debar was called and she called back. She said we should continue to monitor. She also said she would get the legs dopplered. Pt was asked how the cool legs felt, and she said they felt numb. On a second assessment, pt said her back, lower back and left leg were hurting, MD coverage, Lynch was called and she said we should continue to monitor with he pain meds. Will continue to monitor.

## 2016-01-10 NOTE — Progress Notes (Signed)
PT Cancellation Note  Patient Details Name: Caitlin Taylor MRN: [id number] DOB: 1970-07-13   Cancelled Treatment:    Reason Eval/Treat Not Completed: Medical issues which prohibited therapy. Noted events of this am (pt fall) with increased pain, cold limbs and pt reports of numbness. Await medical clearance to resume PT. Will follow up tomorrow/Monday.    Willow Ora 01/10/2016, 1:24 PM  Willow Ora, PTA, CLT Acute Rehab Services [phone] 01/10/2016, 1:26 PM

## 2016-01-10 NOTE — Progress Notes (Addendum)
Pt. hemovac dressing saturated, when RN removed dressing to change, found most of the drain out, removed the rest and applied a pressure dressing.  Called Dr. Cyndy Freeze office and left message with answering service.  Return call from Dr. Saintclair Halsted and informed of above.  No new orders given.  Will continue to monitor.  Alphonzo Lemmings, RN

## 2016-01-10 NOTE — Progress Notes (Signed)
   01/09/16 2205  What Happened  Was fall witnessed? No  Was patient injured? No  Patient found on floor  Found by No one-pt stated they fell (Pt called staff to report she had fallen)  Stated prior activity ambulating-unassisted (Going to the restroom, reached across to the table and fell)  Follow Up  MD notified M. Donnal Debar, NP  Time MD notified 2227  Family notified No- patient refusal (Pt says no need)  Additional tests No  Progress note created (see row info) Yes  Adult Fall Risk Assessment  Risk Factor Category (scoring not indicated) High fall risk per protocol (document High fall risk)  Age 45  Fall History: Fall within 6 months prior to admission 5  Elimination; Bowel and/or Urine Incontinence 0  Elimination; Bowel and/or Urine Urgency/Frequency 0  Medications: includes PCA/Opiates, Anti-convulsants, Anti-hypertensives, Diuretics, Hypnotics, Laxatives, Sedatives, and Psychotropics 5  Patient Care Equipment 1  Mobility-Assistance 2  Mobility-Gait 2  Mobility-Sensory Deficit 0  Altered awareness of immediate physical environment 0  Impulsiveness 0  Lack of understanding of one's physical/cognitive limitations 0  Total Score 15  Patient's Fall Risk High Fall Risk (>13 points)  Adult Fall Risk Interventions  Required Bundle Interventions *See Row Information* High fall risk - low, moderate, and high requirements implemented  Additional Interventions Room near nurses station;Individualized elimination schedule  Screening for Fall Injury Risk  Risk For Fall Injury- See Row Information  F  Pain Assessment  Pain Assessment No/denies pain  Pain Score 0  PCA/Epidural/Spinal Assessment  Respiratory Pattern Regular  Neurological  Neuro (WDL) WDL  Level of Consciousness Alert  Orientation Level Oriented X4  Cognition Appropriate at baseline  Speech Clear

## 2016-01-10 NOTE — Progress Notes (Signed)
PROGRESS NOTE  Caitlin Taylor [id number] DOB: 1971/05/03 DOA: 01/04/2016 PCP: Manfred Shirts, PA  Brief History:  45 year old female with a history of impaired glucose tolerance, hypertension, and chronic back pain presented with 4 day history of increasing bilateral lower extremity weakness and numbness. The patient states that she has had back pain for the past 2 years but it has worsened since February 2017. The patient had a mechanical fall on 12/26/2015. She presented to ED at [facility]. The patient was discharged with prednisone which she finished on 01/03/2016. The patient represented to the ED at [facility] on 12/30/2015 and was diagnosed with UTI and started on nitrofurantoin. In the interim, the patient complains of increase right foot and leg weakness with numbness and tingling that has worsened to encompass her left lower extremity up to her knee. She has resorted to using a walker to ambulate. Upon presentation. MRI of the lumbar spine showed severe L4-5 facet arthropathy with increased facet effusions. There was also a left facet synovial cyst resulting in L4-5 mild canal stenosis. Neurosurgery was consulted in the emergency department. Previous medical records reviewed and summarized.  Assessment/Plan: Lumbar radiculopathy - S/P L4-5 posterior lumbar interbody fusion with titanium interbody cages and cortical trajectory pedicle screws at L4 and L5 bilaterally, use of morsellized allograft on 01/06/16 -Post op management as per Ortho team -01/05/2016 MRI lumbar spine--severe L4-5 facet arthropathy with increased facet effusion; left facet synovial cyst causing mild canal stenosis - Pursue inpatient rehab as per PT (CIR consult ordered)  UTI -12/30/2015 UA at [facility]-->+pyruia -12/30/15 urine culture- EColi -continue nitrofurantoin D#3 of 5  AKI on CKD stage II -Baseline creatinine 1.0-1.3. AKI is improving. Stable for discharge to rehab  facility. -Continue to hold nephrotoxics (DC HCTZ, lisinopril and celebrex). Monitor renal function and electrolytes. SCr is improving. Continue to monitor renal function and electrolytes.  Hypertension - Optimize  Lower extremity pain and edema - No DVT as per doppler ultrasound.   Disposition Plan:Will depend on hospital course and Surgical recommendation.  Family Communication: Husband and Mother updated at bedside 6/26  Consultants: Neurosurgery  Code Status: FULL   DVT Prophylaxis: [place] Lovenox   Procedures: As Listed in Progress Note Above  Antibiotics: Nitrofurantoin 01/04/16--->    Subjective: Nil new complaints.   Objective: Filed Vitals:   01/09/16 2214 01/10/16 0044 01/10/16 0405 01/10/16 1346  BP: 134/72 110/45 115/58 122/90  Pulse: 117 104 92 113  Temp: 98.9 F (37.2 C) 98.3 F (36.8 C) 98.7 F (37.1 C) 98.2 F (36.8 C)  TempSrc: Oral Oral Oral   Resp: 20 16 15 19   Height:      Weight:      SpO2: 98% 93% 94% 97%    Intake/Output Summary (Last 24 hours) at 01/10/16 1641 Last data filed at 01/10/16 1549  Gross per 24 hour  Intake    327 ml  Output    900 ml  Net   -573 ml   Weight change:  Exam:   General:  Pt is alert, follows commands appropriately, not in acute distress  HEENT: No icterus, No thrush, No neck mass, [place]/AT  Cardiovascular: RRR, S1/S2, no rubs, no gallops  Respiratory: CTA bilaterally, no wheezing, no crackles, no rhonchi  Abdomen: Soft/+BS, non tender, non distended, no guarding  Extremities:  1 + LE edema, No lymphangitis, No petechiae, No rashes, no synovitis   Data Reviewed: I have  personally reviewed following labs and imaging studies Basic Metabolic Panel:  Recent Labs Lab 01/06/16 1233 01/07/16 0621 01/08/16 0546 01/09/16 0607 01/10/16 1005  NA 137 138 135 136 136  K 4.6 3.9 3.9 3.8 3.5  CL 100* 98* 99* 96* 98*  CO2 28 26 27 30 29   GLUCOSE 100* 116* 101* 95 179*  BUN 15 18 19 18 14   CREATININE  1.32* 1.61* 1.61* 1.42* 1.28*  CALCIUM 9.3 8.5* 8.1* 8.5* 8.5*  PHOS  --   --  3.9 2.7  --    Liver Function Tests:  Recent Labs Lab 01/05/16 0716 01/08/16 0546 01/09/16 0607  AST 26  --   --   ALT 59*  --   --   ALKPHOS 50  --   --   BILITOT 0.8  --   --   PROT 6.3*  --   --   ALBUMIN 3.5 3.2* 3.1*   No results for input(s): LIPASE, AMYLASE in the last 168 hours. No results for input(s): AMMONIA in the last 168 hours. Coagulation Profile: No results for input(s): INR, PROTIME in the last 168 hours. CBC:  Recent Labs Lab 01/04/16 2304 01/05/16 0716 01/07/16 0621  WBC 14.3* 11.3* 12.3*  NEUTROABS 12.3*  --   --   HGB 12.9 11.7* 10.6*  HCT 38.2 36.3 32.4*  MCV 89.9 90.1 89.0  PLT 155 131* 117*   Cardiac Enzymes: No results for input(s): CKTOTAL, CKMB, CKMBINDEX, TROPONINI in the last 168 hours. BNP: Invalid input(s): POCBNP CBG: No results for input(s): GLUCAP in the last 168 hours. HbA1C: No results for input(s): HGBA1C in the last 72 hours. Urine analysis:    Component Value Date/Time   COLORURINE YELLOW 01/07/2016 1811   APPEARANCEUR CLOUDY* 01/07/2016 1811   LABSPEC 1.018 01/07/2016 1811   PHURINE 5.0 01/07/2016 1811   GLUCOSEU NEGATIVE 01/07/2016 1811   HGBUR MODERATE* 01/07/2016 1811   BILIRUBINUR NEGATIVE 01/07/2016 1811   KETONESUR NEGATIVE 01/07/2016 1811   PROTEINUR NEGATIVE 01/07/2016 1811   NITRITE NEGATIVE 01/07/2016 1811   LEUKOCYTESUR MODERATE* 01/07/2016 1811   Sepsis Labs: @LABRCNTIP (procalcitonin:4,lacticidven:4) ) Recent Results (from the past 240 hour(s))  MRSA PCR Screening     Status: None   Collection Time: 01/05/16  4:26 PM  Result Value Ref Range Status   MRSA by PCR NEGATIVE NEGATIVE Final    Comment:        The GeneXpert MRSA Assay (FDA approved for NASAL specimens only), is one component of a comprehensive MRSA colonization surveillance program. It is not intended to diagnose MRSA infection nor to guide or monitor  treatment for MRSA infections.   Surgical pcr screen     Status: None   Collection Time: 01/05/16  9:44 PM  Result Value Ref Range Status   MRSA, PCR NEGATIVE NEGATIVE Final   Staphylococcus aureus NEGATIVE NEGATIVE Final    Comment:        The Xpert SA Assay (FDA approved for NASAL specimens in patients over 69 years of age), is one component of a comprehensive surveillance program.  Test performance has been validated by [facility] for patients greater than or equal to 26 year old. It is not intended to diagnose infection nor to guide or monitor treatment.      Scheduled Meds: . acetaminophen  1,000 mg Oral Q6H  . docusate sodium  100 mg Oral BID  . enoxaparin (LOVENOX) injection  0.5 mg/kg Subcutaneous Q24H  . furosemide  80 mg Oral Daily  .  gabapentin  300 mg Oral TID  . methocarbamol  750 mg Oral QID  . nitrofurantoin  100 mg Oral BID  . oxyCODONE  20 mg Oral Q12H  . pantoprazole  40 mg Oral Daily  . senna  1 tablet Oral BID  . sodium chloride flush  3 mL Intravenous Q12H  . sodium chloride flush  3 mL Intravenous Q12H   Continuous Infusions: . sodium chloride 100 mL/hr at 01/07/16 1110  . sodium chloride      Procedures/Studies: Dg Lumbar Spine 2-3 Views  01/06/2016  CLINICAL DATA:  L4-5 PLIF. EXAM: Operative LUMBAR SPINE - 2-3 VIEW COMPARISON:  MRI lumbar spine 01/05/2016. FINDINGS: Two spot images from the C-arm fluoroscopic device, AP and lateral views of the lower lumbar spine are submitted for interpretation postoperatively. L4-5 PLIF and interbody fusion with anatomic alignment. The radiologic technologist documented 1 min of fluoroscopy time. IMPRESSION: L4-5 PLIF and interbody fusion with anatomic alignment. Electronically Signed   By: Evangeline Dakin M.D.   On: 01/06/2016 21:51   Mr Lumbar Spine Wo Contrast  01/05/2016  CLINICAL DATA:  Gradually worsening, acute on chronic constant back pain for 1 month. Pain worsening for 4 days. Reported synovial cyst  of the spine. Numbness RIGHT leg. Recent falls. EXAM: MRI LUMBAR SPINE WITHOUT CONTRAST TECHNIQUE: Multiplanar, multisequence MR imaging of the lumbar spine was performed. No intravenous contrast was administered. COMPARISON:  MRI of the lumbar spine December 26, 2015 FINDINGS: Large body habitus results in decreased signal to noise ratio. OSSEOUS STRUCTURES: Lumbar vertebral bodies are intact and aligned with maintenance of lumbar lordosis. Intervertebral discs demonstrate normal morphology and signal characteristics. No abnormal bone marrow signal. SPINAL CORD: Conus medullaris terminates at L1-2 and demonstrates normal morphology and signal characteristics. Cauda equina is normal. SOFT TISSUES: Included prevertebral and paraspinal soft tissues are normal. LEVEL BY LEVEL EVALUATION: L1-2 through L3-4:: No disc bulge, canal stenosis nor neural foraminal narrowing. L4-5: Severe facet arthropathy with increasing bilateral facet effusions, measuring up to 3 mm on the LEFT. 7 x 7 x 7 mm LEFT facet synovial cyst extending into the dorsal epidural space resulting in mild thecal sac effacement and canal stenosis. No neural foraminal narrowing. L5-S1: No disc bulge, canal stenosis nor neural foraminal narrowing. IMPRESSION: Severe L4-5 facet arthropathy with increasing facet effusions which are likely reactive. Mild L4-5 facet widening can be seen with dynamic instability. Similar 7 x 7 x 7 mm LEFT facet synovial cyst within dorsal epidural space resulting in L4-5 mild canal stenosis. No acute fracture or malalignment. Electronically Signed   By: Elon Alas M.D.   On: 01/05/2016 03:26   Dg C-arm 1-60 Min  01/06/2016  CLINICAL DATA:  L4-5 PLIF. EXAM: Operative LUMBAR SPINE - 2-3 VIEW COMPARISON:  MRI lumbar spine 01/05/2016. FINDINGS: Two spot images from the C-arm fluoroscopic device, AP and lateral views of the lower lumbar spine are submitted for interpretation postoperatively. L4-5 PLIF and interbody fusion with  anatomic alignment. The radiologic technologist documented 1 min of fluoroscopy time. IMPRESSION: L4-5 PLIF and interbody fusion with anatomic alignment. Electronically Signed   By: Evangeline Dakin M.D.   On: 01/06/2016 21:51   Dg Scoliosis Eval Complete Spine Min 6 Views  01/05/2016  CLINICAL DATA:  Lumbar spinal stenosis. EXAM: DG SCOLIOSIS EVAL COMPLETE SPINE 6+V COMPARISON:  MRI of the lumbar spine dated 01/05/2016 FINDINGS: AP and lateral images of the entire spine were obtained and demonstrate no evidence of scoliosis. There is no bone destruction fracture.  There is slight accentuation of the lumbar lordosis. IMPRESSION: 1. No scoliosis. 2. Slight accentuation of the lumbar lordosis. 3. Otherwise, normal exam. Electronically Signed   By: Lorriane Shire M.D.   On: 01/05/2016 18:47    Bonnell Public, DO  [facility] Pager [phone]  If 7PM-7AM, please contact night-coverage [email or web address] Password [email or web address] 01/10/2016, 4:41 PM   LOS: 5 days

## 2016-01-11 DIAGNOSIS — N289 Disorder of kidney and ureter, unspecified: Secondary | ICD-10-CM

## 2016-01-11 LAB — BASIC METABOLIC PANEL
Anion gap: 8 (ref 5–15)
BUN: 12 mg/dL (ref 6–20)
CO2: 28 mmol/L (ref 22–32)
Calcium: 8.5 mg/dL — ABNORMAL LOW (ref 8.9–10.3)
Chloride: 100 mmol/L — ABNORMAL LOW (ref 101–111)
Creatinine, Ser: 1.28 mg/dL — ABNORMAL HIGH (ref 0.44–1.00)
GFR calc Af Amer: 58 mL/min — ABNORMAL LOW (ref >60)
GFR calc non Af Amer: 50 mL/min — ABNORMAL LOW (ref >60)
Glucose, Bld: 141 mg/dL — ABNORMAL HIGH (ref 65–99)
Potassium: 3.3 mmol/L — ABNORMAL LOW (ref 3.5–5.1)
Sodium: 136 mmol/L (ref 135–145)

## 2016-01-11 LAB — URINALYSIS, ROUTINE W REFLEX MICROSCOPIC
Bilirubin Urine: NEGATIVE
Glucose, UA: NEGATIVE mg/dL
Ketones, ur: NEGATIVE mg/dL
Nitrite: NEGATIVE
Protein, ur: NEGATIVE mg/dL
Specific Gravity, Urine: 1.016 (ref 1.005–1.030)
pH: 6 (ref 5.0–8.0)

## 2016-01-11 LAB — URINE MICROSCOPIC-ADD ON: RBC / HPF: NONE SEEN RBC/hpf (ref 0–5)

## 2016-01-11 MED ORDER — LISINOPRIL 10 MG PO TABS
10.0000 mg | ORAL_TABLET | Freq: Every day | ORAL | Status: DC
  Administered 2016-01-11 – 2016-01-12 (×2): 10 mg via ORAL
  Filled 2016-01-11 (×2): qty 1

## 2016-01-11 NOTE — Progress Notes (Signed)
New dressing placed to lower back where hemovac drain was, dressing saturated with Serosanguineous drainage.  Will continue to monitor.  Alphonzo Lemmings, RN

## 2016-01-11 NOTE — Progress Notes (Signed)
PROGRESS NOTE  Caitlin Taylor [id number] DOB: 09/12/1970 DOA: 01/04/2016 PCP: Manfred Shirts, PA  Brief History:  45 year old female with a history of impaired glucose tolerance, hypertension, and chronic back pain presented with 4 day history of increasing bilateral lower extremity weakness and numbness. The patient states that she has had back pain for the past 2 years but it has worsened since February 2017. The patient had a mechanical fall on 12/26/2015. She presented to ED at [facility]. The patient was discharged with prednisone which she finished on 01/03/2016. The patient represented to the ED at [facility] on 12/30/2015 and was diagnosed with UTI and started on nitrofurantoin. In the interim, the patient complains of increase right foot and leg weakness with numbness and tingling that has worsened to encompass her left lower extremity up to her knee. She has resorted to using a walker to ambulate. Upon presentation. MRI of the lumbar spine showed severe L4-5 facet arthropathy with increased facet effusions. There was also a left facet synovial cyst resulting in L4-5 mild canal stenosis. Neurosurgery was consulted in the emergency department. Previous medical records reviewed and summarized.  Assessment/Plan: Lumbar radiculopathy - S/P L4-5 posterior lumbar interbody fusion with titanium interbody cages and cortical trajectory pedicle screws at L4 and L5 bilaterally, use of morsellized allograft on 01/06/16 -Post op management as per Ortho team -01/05/2016 MRI lumbar spine--severe L4-5 facet arthropathy with increased facet effusion; left facet synovial cyst causing mild canal stenosis - Pursue inpatient rehab as per PT (CIR consult ordered)  UTI -12/30/2015 UA at [facility]-->+pyruia -12/30/15 urine culture- EColi  AKI on CKD stage II -Baseline creatinine 1.0-1.3. AKI is improving. Stable for discharge to rehab facility. -Continue to hold  nephrotoxics (DC HCTZ, lisinopril and celebrex). Monitor renal function and electrolytes. SCr is improving. Continue to monitor renal function and electrolytes.  Hypertension - Optimize  Lower extremity pain and edema - No DVT as per doppler ultrasound.   Disposition Plan:Will depend on hospital course and Surgical recommendation.  Family Communication: Husband and Mother updated at bedside 6/26  Consultants: Neurosurgery  Code Status: FULL   DVT Prophylaxis: [place] Lovenox   Procedures: As Listed in Progress Note Above  Antibiotics: Nitrofurantoin 01/04/16--->    Subjective: Nil new complaints. Continues to improve. Will restart lisinopril.  Objective: Filed Vitals:   01/10/16 0405 01/10/16 1346 01/10/16 2230 01/11/16 0600  BP: 115/58 122/90 150/86 130/77  Pulse: 92 113 97 102  Temp: 98.7 F (37.1 C) 98.2 F (36.8 C) 97.8 F (36.6 C) 98.4 F (36.9 C)  TempSrc: Oral   Oral  Resp: 15 19 18 16   Height:      Weight:      SpO2: 94% 97% 97% 96%    Intake/Output Summary (Last 24 hours) at 01/11/16 0914 Last data filed at 01/11/16 [id number]  Gross per 24 hour  Intake    323 ml  Output   1600 ml  Net  -1277 ml   Weight change:  Exam:   General:  Pt is alert, follows commands appropriately, not in acute distress  HEENT: No icterus, No thrush, No neck mass, [place]/AT  Cardiovascular: RRR, S1/S2, no rubs, no gallops  Respiratory: CTA bilaterally, no wheezing, no crackles, no rhonchi  Abdomen: Soft/+BS, non tender, non distended, no guarding  Extremities:  1 + LE edema, No lymphangitis, No petechiae, No rashes, no synovitis   Data Reviewed: I have personally reviewed  following labs and imaging studies Basic Metabolic Panel:  Recent Labs Lab 01/06/16 1233 01/07/16 0621 01/08/16 0546 01/09/16 0607 01/10/16 1005  NA 137 138 135 136 136  K 4.6 3.9 3.9 3.8 3.5  CL 100* 98* 99* 96* 98*  CO2 28 26 27 30 29   GLUCOSE 100* 116* 101* 95 179*  BUN 15 18 19 18 14     CREATININE 1.32* 1.61* 1.61* 1.42* 1.28*  CALCIUM 9.3 8.5* 8.1* 8.5* 8.5*  PHOS  --   --  3.9 2.7  --    Liver Function Tests:  Recent Labs Lab 01/05/16 0716 01/08/16 0546 01/09/16 0607  AST 26  --   --   ALT 59*  --   --   ALKPHOS 50  --   --   BILITOT 0.8  --   --   PROT 6.3*  --   --   ALBUMIN 3.5 3.2* 3.1*   No results for input(s): LIPASE, AMYLASE in the last 168 hours. No results for input(s): AMMONIA in the last 168 hours. Coagulation Profile: No results for input(s): INR, PROTIME in the last 168 hours. CBC:  Recent Labs Lab 01/04/16 2304 01/05/16 0716 01/07/16 0621  WBC 14.3* 11.3* 12.3*  NEUTROABS 12.3*  --   --   HGB 12.9 11.7* 10.6*  HCT 38.2 36.3 32.4*  MCV 89.9 90.1 89.0  PLT 155 131* 117*   Cardiac Enzymes: No results for input(s): CKTOTAL, CKMB, CKMBINDEX, TROPONINI in the last 168 hours. BNP: Invalid input(s): POCBNP CBG: No results for input(s): GLUCAP in the last 168 hours. HbA1C: No results for input(s): HGBA1C in the last 72 hours. Urine analysis:    Component Value Date/Time   COLORURINE YELLOW 01/07/2016 1811   APPEARANCEUR CLOUDY* 01/07/2016 1811   LABSPEC 1.018 01/07/2016 1811   PHURINE 5.0 01/07/2016 1811   GLUCOSEU NEGATIVE 01/07/2016 1811   HGBUR MODERATE* 01/07/2016 1811   BILIRUBINUR NEGATIVE 01/07/2016 1811   KETONESUR NEGATIVE 01/07/2016 1811   PROTEINUR NEGATIVE 01/07/2016 1811   NITRITE NEGATIVE 01/07/2016 1811   LEUKOCYTESUR MODERATE* 01/07/2016 1811   Sepsis Labs: @LABRCNTIP (procalcitonin:4,lacticidven:4) ) Recent Results (from the past 240 hour(s))  MRSA PCR Screening     Status: None   Collection Time: 01/05/16  4:26 PM  Result Value Ref Range Status   MRSA by PCR NEGATIVE NEGATIVE Final    Comment:        The GeneXpert MRSA Assay (FDA approved for NASAL specimens only), is one component of a comprehensive MRSA colonization surveillance program. It is not intended to diagnose MRSA infection nor to guide  or monitor treatment for MRSA infections.   Surgical pcr screen     Status: None   Collection Time: 01/05/16  9:44 PM  Result Value Ref Range Status   MRSA, PCR NEGATIVE NEGATIVE Final   Staphylococcus aureus NEGATIVE NEGATIVE Final    Comment:        The Xpert SA Assay (FDA approved for NASAL specimens in patients over 22 years of age), is one component of a comprehensive surveillance program.  Test performance has been validated by [facility] for patients greater than or equal to 73 year old. It is not intended to diagnose infection nor to guide or monitor treatment.      Scheduled Meds: . acetaminophen  1,000 mg Oral Q6H  . docusate sodium  100 mg Oral BID  . enoxaparin (LOVENOX) injection  0.5 mg/kg Subcutaneous Q24H  . furosemide  80 mg Oral Daily  . gabapentin  300 mg Oral TID  . lisinopril  10 mg Oral Daily  . methocarbamol  750 mg Oral QID  . nitrofurantoin  100 mg Oral BID  . oxyCODONE  20 mg Oral Q12H  . pantoprazole  40 mg Oral Daily  . senna  1 tablet Oral BID  . sodium chloride flush  3 mL Intravenous Q12H  . sodium chloride flush  3 mL Intravenous Q12H   Continuous Infusions: . sodium chloride 100 mL/hr at 01/07/16 1110  . sodium chloride      Procedures/Studies: Dg Lumbar Spine 2-3 Views  01/06/2016  CLINICAL DATA:  L4-5 PLIF. EXAM: Operative LUMBAR SPINE - 2-3 VIEW COMPARISON:  MRI lumbar spine 01/05/2016. FINDINGS: Two spot images from the C-arm fluoroscopic device, AP and lateral views of the lower lumbar spine are submitted for interpretation postoperatively. L4-5 PLIF and interbody fusion with anatomic alignment. The radiologic technologist documented 1 min of fluoroscopy time. IMPRESSION: L4-5 PLIF and interbody fusion with anatomic alignment. Electronically Signed   By: Evangeline Dakin M.D.   On: 01/06/2016 21:51   Mr Lumbar Spine Wo Contrast  01/05/2016  CLINICAL DATA:  Gradually worsening, acute on chronic constant back pain for 1 month. Pain  worsening for 4 days. Reported synovial cyst of the spine. Numbness RIGHT leg. Recent falls. EXAM: MRI LUMBAR SPINE WITHOUT CONTRAST TECHNIQUE: Multiplanar, multisequence MR imaging of the lumbar spine was performed. No intravenous contrast was administered. COMPARISON:  MRI of the lumbar spine December 26, 2015 FINDINGS: Large body habitus results in decreased signal to noise ratio. OSSEOUS STRUCTURES: Lumbar vertebral bodies are intact and aligned with maintenance of lumbar lordosis. Intervertebral discs demonstrate normal morphology and signal characteristics. No abnormal bone marrow signal. SPINAL CORD: Conus medullaris terminates at L1-2 and demonstrates normal morphology and signal characteristics. Cauda equina is normal. SOFT TISSUES: Included prevertebral and paraspinal soft tissues are normal. LEVEL BY LEVEL EVALUATION: L1-2 through L3-4:: No disc bulge, canal stenosis nor neural foraminal narrowing. L4-5: Severe facet arthropathy with increasing bilateral facet effusions, measuring up to 3 mm on the LEFT. 7 x 7 x 7 mm LEFT facet synovial cyst extending into the dorsal epidural space resulting in mild thecal sac effacement and canal stenosis. No neural foraminal narrowing. L5-S1: No disc bulge, canal stenosis nor neural foraminal narrowing. IMPRESSION: Severe L4-5 facet arthropathy with increasing facet effusions which are likely reactive. Mild L4-5 facet widening can be seen with dynamic instability. Similar 7 x 7 x 7 mm LEFT facet synovial cyst within dorsal epidural space resulting in L4-5 mild canal stenosis. No acute fracture or malalignment. Electronically Signed   By: Elon Alas M.D.   On: 01/05/2016 03:26   Dg C-arm 1-60 Min  01/06/2016  CLINICAL DATA:  L4-5 PLIF. EXAM: Operative LUMBAR SPINE - 2-3 VIEW COMPARISON:  MRI lumbar spine 01/05/2016. FINDINGS: Two spot images from the C-arm fluoroscopic device, AP and lateral views of the lower lumbar spine are submitted for interpretation  postoperatively. L4-5 PLIF and interbody fusion with anatomic alignment. The radiologic technologist documented 1 min of fluoroscopy time. IMPRESSION: L4-5 PLIF and interbody fusion with anatomic alignment. Electronically Signed   By: Evangeline Dakin M.D.   On: 01/06/2016 21:51   Dg Scoliosis Eval Complete Spine Min 6 Views  01/05/2016  CLINICAL DATA:  Lumbar spinal stenosis. EXAM: DG SCOLIOSIS EVAL COMPLETE SPINE 6+V COMPARISON:  MRI of the lumbar spine dated 01/05/2016 FINDINGS: AP and lateral images of the entire spine were obtained and demonstrate no evidence of scoliosis.  There is no bone destruction fracture. There is slight accentuation of the lumbar lordosis. IMPRESSION: 1. No scoliosis. 2. Slight accentuation of the lumbar lordosis. 3. Otherwise, normal exam. Electronically Signed   By: Lorriane Shire M.D.   On: 01/05/2016 18:47    Bonnell Public, DO  [facility] Pager [phone]  If 7PM-7AM, please contact night-coverage [email or web address] Password [email or web address] 01/11/2016, 9:14 AM   LOS: 6 days

## 2016-01-12 ENCOUNTER — Inpatient Hospital Stay (HOSPITAL_COMMUNITY)
Admission: RE | Admit: 2016-01-12 | Discharge: 2016-01-21 | DRG: 949 | Disposition: A | Discharge status: Home Health | Payer: 59 | Source: Intra-hospital | Attending: Physical Medicine & Rehabilitation | Admitting: Physical Medicine & Rehabilitation

## 2016-01-12 DIAGNOSIS — G959 Disease of spinal cord, unspecified: Secondary | ICD-10-CM | POA: Diagnosis present

## 2016-01-12 DIAGNOSIS — Z981 Arthrodesis status: Secondary | ICD-10-CM

## 2016-01-12 DIAGNOSIS — N390 Urinary tract infection, site not specified: Secondary | ICD-10-CM | POA: Insufficient documentation

## 2016-01-12 DIAGNOSIS — Z6841 Body Mass Index (BMI) 40.0 and over, adult: Secondary | ICD-10-CM

## 2016-01-12 DIAGNOSIS — M545 Low back pain, unspecified: Secondary | ICD-10-CM | POA: Insufficient documentation

## 2016-01-12 DIAGNOSIS — K5900 Constipation, unspecified: Secondary | ICD-10-CM | POA: Diagnosis present

## 2016-01-12 DIAGNOSIS — D62 Acute posthemorrhagic anemia: Secondary | ICD-10-CM | POA: Diagnosis present

## 2016-01-12 DIAGNOSIS — M48061 Spinal stenosis, lumbar region without neurogenic claudication: Secondary | ICD-10-CM | POA: Insufficient documentation

## 2016-01-12 DIAGNOSIS — R600 Localized edema: Secondary | ICD-10-CM | POA: Diagnosis present

## 2016-01-12 DIAGNOSIS — Z79899 Other long term (current) drug therapy: Secondary | ICD-10-CM | POA: Diagnosis not present

## 2016-01-12 DIAGNOSIS — R609 Edema, unspecified: Secondary | ICD-10-CM | POA: Diagnosis not present

## 2016-01-12 DIAGNOSIS — N189 Chronic kidney disease, unspecified: Secondary | ICD-10-CM | POA: Diagnosis present

## 2016-01-12 DIAGNOSIS — I129 Hypertensive chronic kidney disease with stage 1 through stage 4 chronic kidney disease, or unspecified chronic kidney disease: Secondary | ICD-10-CM | POA: Diagnosis present

## 2016-01-12 DIAGNOSIS — G8918 Other acute postprocedural pain: Secondary | ICD-10-CM

## 2016-01-12 DIAGNOSIS — Z4889 Encounter for other specified surgical aftercare: Secondary | ICD-10-CM | POA: Diagnosis present

## 2016-01-12 DIAGNOSIS — L299 Pruritus, unspecified: Secondary | ICD-10-CM | POA: Insufficient documentation

## 2016-01-12 DIAGNOSIS — D72829 Elevated white blood cell count, unspecified: Secondary | ICD-10-CM | POA: Diagnosis not present

## 2016-01-12 DIAGNOSIS — M5441 Lumbago with sciatica, right side: Secondary | ICD-10-CM

## 2016-01-12 DIAGNOSIS — B9620 Unspecified Escherichia coli [E. coli] as the cause of diseases classified elsewhere: Secondary | ICD-10-CM | POA: Diagnosis present

## 2016-01-12 DIAGNOSIS — E876 Hypokalemia: Secondary | ICD-10-CM | POA: Diagnosis not present

## 2016-01-12 DIAGNOSIS — M5442 Lumbago with sciatica, left side: Secondary | ICD-10-CM

## 2016-01-12 DIAGNOSIS — I10 Essential (primary) hypertension: Secondary | ICD-10-CM | POA: Diagnosis not present

## 2016-01-12 DIAGNOSIS — K5903 Drug induced constipation: Secondary | ICD-10-CM | POA: Insufficient documentation

## 2016-01-12 DIAGNOSIS — N183 Chronic kidney disease, stage 3 unspecified: Secondary | ICD-10-CM | POA: Insufficient documentation

## 2016-01-12 DIAGNOSIS — D649 Anemia, unspecified: Secondary | ICD-10-CM | POA: Insufficient documentation

## 2016-01-12 LAB — COMPREHENSIVE METABOLIC PANEL
ALT: 150 U/L — ABNORMAL HIGH (ref 14–54)
ANION GAP: 9 (ref 5–15)
AST: 104 U/L — AB (ref 15–41)
Albumin: 3.4 g/dL — ABNORMAL LOW (ref 3.5–5.0)
Alkaline Phosphatase: 64 U/L (ref 38–126)
BILIRUBIN TOTAL: 0.9 mg/dL (ref 0.3–1.2)
BUN: 15 mg/dL (ref 6–20)
CO2: 27 mmol/L (ref 22–32)
Calcium: 8.7 mg/dL — ABNORMAL LOW (ref 8.9–10.3)
Chloride: 99 mmol/L — ABNORMAL LOW (ref 101–111)
Creatinine, Ser: 1.26 mg/dL — ABNORMAL HIGH (ref 0.44–1.00)
GFR, EST AFRICAN AMERICAN: 59 mL/min — AB (ref >60)
GFR, EST NON AFRICAN AMERICAN: 51 mL/min — AB (ref >60)
Glucose, Bld: 85 mg/dL (ref 65–99)
POTASSIUM: 3.5 mmol/L (ref 3.5–5.1)
Sodium: 135 mmol/L (ref 135–145)
TOTAL PROTEIN: 6.1 g/dL — AB (ref 6.5–8.1)

## 2016-01-12 LAB — CBC WITH DIFFERENTIAL/PLATELET
BASOS PCT: 0 %
Basophils Absolute: 0 10*3/uL (ref 0.0–0.1)
EOS ABS: 0.1 10*3/uL (ref 0.0–0.7)
EOS PCT: 1 %
HEMATOCRIT: 29.7 % — AB (ref 36.0–46.0)
Hemoglobin: 9.7 g/dL — ABNORMAL LOW (ref 12.0–15.0)
LYMPHS ABS: 0.9 10*3/uL (ref 0.7–4.0)
Lymphocytes Relative: 8 %
MCH: 30.8 pg (ref 26.0–34.0)
MCHC: 32.7 g/dL (ref 30.0–36.0)
MCV: 94.3 fL (ref 78.0–100.0)
MONO ABS: 1.1 10*3/uL — AB (ref 0.1–1.0)
Monocytes Relative: 9 %
NEUTROS ABS: 9.7 10*3/uL — AB (ref 1.7–7.7)
Neutrophils Relative %: 82 %
PLATELETS: 194 10*3/uL (ref 150–400)
RBC: 3.15 MIL/uL — ABNORMAL LOW (ref 3.87–5.11)
RDW: 14.2 % (ref 11.5–15.5)
WBC: 11.8 10*3/uL — ABNORMAL HIGH (ref 4.0–10.5)

## 2016-01-12 LAB — CREATININE, SERUM
Creatinine, Ser: 1.2 mg/dL — ABNORMAL HIGH (ref 0.44–1.00)
GFR calc non Af Amer: 54 mL/min — ABNORMAL LOW (ref >60)

## 2016-01-12 MED ORDER — FLEET ENEMA 7-19 GM/118ML RE ENEM: 1.0000 | ENEMA | Freq: Once | RECTAL | Status: DC | PRN

## 2016-01-12 MED ORDER — GUAIFENESIN-DM 100-10 MG/5ML PO SYRP: 5.0000 mL | ORAL_SOLUTION | Freq: Four times a day (QID) | ORAL | Status: DC | PRN

## 2016-01-12 MED ORDER — ACETAMINOPHEN 325 MG PO TABS: 325.0000 mg | ORAL_TABLET | ORAL | Status: DC | PRN

## 2016-01-12 MED ORDER — MENTHOL 3 MG MT LOZG
1.0000 | LOZENGE | OROMUCOSAL | Status: DC | PRN
  Filled 2016-01-12: qty 9

## 2016-01-12 MED ORDER — ALUM & MAG HYDROXIDE-SIMETH 200-200-20 MG/5ML PO SUSP: 30.0000 mL | ORAL | Status: DC | PRN

## 2016-01-12 MED ORDER — ENOXAPARIN SODIUM 60 MG/0.6ML ~~LOC~~ SOLN
60.0000 mg | SUBCUTANEOUS | Status: DC
  Administered 2016-01-13 – 2016-01-21 (×9): 60 mg via SUBCUTANEOUS
  Filled 2016-01-12 (×9): qty 0.6

## 2016-01-12 MED ORDER — PROCHLORPERAZINE 25 MG RE SUPP
12.5000 mg | Freq: Four times a day (QID) | RECTAL | Status: DC | PRN
  Filled 2016-01-12: qty 1

## 2016-01-12 MED ORDER — POLYETHYLENE GLYCOL 3350 17 G PO PACK
17.0000 g | PACK | Freq: Once | ORAL | Status: DC
  Filled 2016-01-12 (×2): qty 1

## 2016-01-12 MED ORDER — GABAPENTIN 300 MG PO CAPS
300.0000 mg | ORAL_CAPSULE | Freq: Three times a day (TID) | ORAL | Status: DC
  Administered 2016-01-12 – 2016-01-21 (×26): 300 mg via ORAL
  Filled 2016-01-12 (×26): qty 1

## 2016-01-12 MED ORDER — PROCHLORPERAZINE EDISYLATE 5 MG/ML IJ SOLN: 5.0000 mg | Freq: Four times a day (QID) | INTRAMUSCULAR | Status: DC | PRN

## 2016-01-12 MED ORDER — OXYCODONE HCL 5 MG PO TABS
5.0000 mg | ORAL_TABLET | ORAL | Status: DC | PRN
Start: 2016-01-12 — End: 2016-01-15
  Administered 2016-01-13 – 2016-01-14 (×3): 5 mg via ORAL
  Filled 2016-01-12 (×3): qty 1

## 2016-01-12 MED ORDER — FUROSEMIDE 40 MG PO TABS
80.0000 mg | ORAL_TABLET | Freq: Every day | ORAL | Status: DC
  Administered 2016-01-13 – 2016-01-21 (×9): 80 mg via ORAL
  Filled 2016-01-12 (×10): qty 2

## 2016-01-12 MED ORDER — OXYCODONE HCL ER 10 MG PO T12A
20.0000 mg | EXTENDED_RELEASE_TABLET | Freq: Two times a day (BID) | ORAL | Status: DC
  Administered 2016-01-12 – 2016-01-15 (×6): 20 mg via ORAL
  Filled 2016-01-12 (×6): qty 2

## 2016-01-12 MED ORDER — PHENOL 1.4 % MT LIQD: 1.0000 | OROMUCOSAL | Status: DC | PRN

## 2016-01-12 MED ORDER — PANTOPRAZOLE SODIUM 40 MG PO TBEC
40.0000 mg | DELAYED_RELEASE_TABLET | Freq: Every day | ORAL | Status: DC
  Administered 2016-01-13 – 2016-01-21 (×9): 40 mg via ORAL
  Filled 2016-01-12 (×9): qty 1

## 2016-01-12 MED ORDER — BISACODYL 10 MG RE SUPP: 10.0000 mg | Freq: Every day | RECTAL | Status: DC | PRN

## 2016-01-12 MED ORDER — TRAZODONE HCL 50 MG PO TABS: 25.0000 mg | ORAL_TABLET | Freq: Every evening | ORAL | Status: DC | PRN

## 2016-01-12 MED ORDER — METHOCARBAMOL 750 MG PO TABS
750.0000 mg | ORAL_TABLET | Freq: Four times a day (QID) | ORAL | Status: DC
  Administered 2016-01-12 – 2016-01-16 (×14): 750 mg via ORAL
  Filled 2016-01-12 (×14): qty 1

## 2016-01-12 MED ORDER — LISINOPRIL 10 MG PO TABS
10.0000 mg | ORAL_TABLET | Freq: Every day | ORAL | Status: DC
  Administered 2016-01-13 – 2016-01-21 (×9): 10 mg via ORAL
  Filled 2016-01-12 (×9): qty 1

## 2016-01-12 MED ORDER — PROCHLORPERAZINE MALEATE 5 MG PO TABS: 5.0000 mg | ORAL_TABLET | Freq: Four times a day (QID) | ORAL | Status: DC | PRN

## 2016-01-12 MED ORDER — POLYETHYLENE GLYCOL 3350 17 G PO PACK
17.0000 g | PACK | Freq: Every day | ORAL | Status: DC | PRN
  Administered 2016-01-14: 17 g via ORAL
  Filled 2016-01-12: qty 1

## 2016-01-12 NOTE — Progress Notes (Signed)
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 Referring Physician: Dr. Marthenia Rolling   HPI: Caitlin Taylor is a 45 y.o. female with history of morbid obesity, HTN, back pain X 2 years with RLE weakness and foot drop and treated with steroids and NSAIDS in the past. She was admitted via ED on 01/04/16 with 1-2 week history of LLE weakness and numbness. MRI spine showed severe L4/5 facet arthropathy with increasing effusions and mild widening/instability and stable facet synovial cyst. She was evaluated by Dr. Cyndy Freeze who felt that bilateral synovial cysts were causing severe stenosis and patient underwent L4/5 nerve root decompression with fusion on 01/06/16. Post op continues to be limted by BLE pain, BLE weakness with proprioceptive deficits and has difficulty with mobility. PT/OT evaluations done yesterday and CIR recommended for follow up therapy.    Review of Systems  HENT: Negative for hearing loss.  Eyes: Negative for blurred vision and double vision.  Respiratory: Positive for shortness of breath (with activity). Negative for cough and stridor.  Cardiovascular: Positive for leg swelling (chronic edema for years).  Gastrointestinal: Positive for constipation. Negative for heartburn, nausea and abdominal pain.  Genitourinary: Negative for dysuria and urgency.  Musculoskeletal: Positive for myalgias and back pain.  Skin: Negative for itching and rash.  Neurological: Positive for tremors (BUE since surgery), sensory change, focal weakness and weakness. Negative for dizziness, tingling and headaches.  Psychiatric/Behavioral: Negative for depression. The  patient is nervous/anxious (concerned about weakness and slow progress. ).      Past Medical History  Diagnosis Date  . Chronic back pain   . Hypertension     Past Surgical History  Procedure Laterality Date  . Cesarean section    . Maximum access (mas)posterior lumbar interbody fusion (plif) 1 level N/A 01/06/2016    Procedure: FOR MAXIMUM ACCESS (MAS) POSTERIOR LUMBAR INTERBODY FUSION (PLIF) LUMBAR FOUR-FIVE; Surgeon: Kevan Ny Ditty, MD; Location: [facility]; Service: Neurosurgery; Laterality: N/A;    Family History  Problem Relation Age of Onset  . Congestive Heart Failure Father     Social History: Married. 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 Has been living with mother and helping her for the past year. Has husband and children in town. She reports that she has never smoked. She does not have any smokeless tobacco history on file. She reports that she does not drink alcohol or use illicit drugs.    Allergies: No Known Allergies    Medications Prior to Admission  Medication Sig Dispense Refill  . Cyanocobalamin (VITAMIN B-12 PO) Take 1 tablet by mouth daily.    . cyclobenzaprine (FLEXERIL) 10 MG tablet Take 10 mg by mouth 2 (two) times daily.    . furosemide (LASIX) 20 MG tablet Take 80 mg by mouth daily.     Marland Kitchen gabapentin (NEURONTIN) 300 MG capsule Take 300 mg by mouth at bedtime.    Marland Kitchen lisinopril-hydrochlorothiazide (PRINZIDE,ZESTORETIC) 20-25 MG tablet Take 1 tablet by mouth daily.    . nitrofurantoin (MACRODANTIN) 100 MG capsule Take 100 mg by mouth 2 (two) times daily.     . potassium chloride SA (K-DUR,KLOR-CON) 20 MEQ tablet Take  20 mEq by mouth 2 (two) times daily.      Home: Home Living Family/patient expects to be discharged to:: Private residence (planning to go stay with her mother) Living Arrangements: Spouse/significant other, Children (23 y.o  twins) Available Help at Discharge: Family, Available 24 hours/day (mother) Type of Home: House Home Access: Stairs to enter Technical brewer of Steps: 1 Entrance Stairs-Rails: None Home Layout: One level Bathroom Shower/Tub: Chiropodist: Standard Home Equipment: Hand held shower head  Functional History: Prior Function Level of Independence: Needs assistance Gait / Transfers Assistance Needed: for a week and a half she has been using her mom's RW. Mom uses RW at times too ADL's / Homemaking Assistance Needed: Assist with LB ADLs and IADLs for past 2 weeks Functional Status:  Mobility: Bed Mobility Overal bed mobility: Needs Assistance Bed Mobility: Sidelying to Sit Rolling: Min assist Sidelying to sit: Min assist Sit to sidelying: Mod assist General bed mobility comments: Assist for trunk support to come to sitting position. VCs for log roll technique. Transfers Overall transfer level: Needs assistance Equipment used: Rolling walker (2 wheeled) Transfers: Sit to/from Stand Sit to Stand: Min assist Stand pivot transfers: +2 safety/equipment, Min assist General transfer comment: Assist for boost to stand and to stabilize balance upon standing. VCs for safe hand placement and for back precautions. Ambulation/Gait Ambulation/Gait assistance: Mod assist Ambulation Distance (Feet): 60 Feet Assistive device: Rolling walker (2 wheeled) Gait Pattern/deviations: Step-to pattern, Steppage, Wide base of support, Ataxic, Decreased stride length, Trendelenburg General Gait Details: weakness in LE's, reports legs and arms feel really heavy; several stops to rest in hallway with HR up to 123 BP 177/57 RR 40.  Gait velocity: NT, pt limited by pain.     ADL: ADL Overall ADL's : Needs assistance/impaired Grooming: Wash/dry hands, Wash/dry face, Set up, Sitting Upper Body Bathing: Set up, Sitting Lower Body Bathing: Moderate assistance, Sit to/from  stand Upper Body Dressing : Minimal assistance, Sitting Upper Body Dressing Details (indicate cue type and reason): to don/doff back brace Lower Body Dressing: Moderate assistance, Sit to/from stand Lower Body Dressing Details (indicate cue type and reason): unable to cross ankle-over-knee Toilet Transfer: Minimal assistance, Cueing for safety, Ambulation, BSC, RW Toilet Transfer Details (indicate cue type and reason): simulated to chair Toileting- Clothing Manipulation and Hygiene: Minimal assistance, Sit to/from stand Toileting - Clothing Manipulation Details (indicate cue type and reason): educated on use of wet wipes Functional mobility during ADLs: Minimal assistance, Rolling walker General ADL Comments: Session limited as pt's dressing was soaked and draining was leaking significantly.   Cognition: Cognition Overall Cognitive Status: Within Functional Limits for tasks assessed Orientation Level: Oriented X4 Cognition Arousal/Alertness: Awake/alert Behavior During Therapy: WFL for tasks assessed/performed Overall Cognitive Status: Within Functional Limits for tasks assessed   Blood pressure 133/61, pulse 92, temperature 97.9 F (36.6 C), temperature source Oral, resp. rate 13, height 5' 3.6" (1.615 m), weight 122.108 kg (269 lb 3.2 oz), last menstrual period 12/28/2015, SpO2 100 %. Physical Exam  Nursing note and vitals reviewed. Constitutional: She is oriented to person, place, and time. She appears well-developed and well-nourished.  Obese female. NAD  HENT:  Head: Atraumatic.  Mouth/Throat: Oropharynx is clear and moist.  Eyes: Conjunctivae and EOM are normal. Pupils are equal, round, and reactive to light. No scleral icterus.  Neck: Neck supple.  Cardiovascular: Normal rate and regular rhythm.  No murmur heard. Respiratory: Effort normal and breath sounds normal. No stridor. No respiratory distress. She has no  wheezes.  GI: Soft. Bowel sounds are normal. She exhibits  no distension. There is no tenderness.  Musculoskeletal: She exhibits edema.  1-2+ edema BLE  Neurological: She is alert and oriented to person, place, and time. She displays abnormal reflex. Coordination abnormal.  Speech clear. Follows commands without difficulty. Sensory deficits BLE with weakness LLE> RLE. RLE: HF 4/5, KE 3+, ADF/PF 1-2/5. LLE: HF 4/5, KE 4, ADF/PF 3/5. Decreased sensation to LT over the anterolateral right leg/foot  Skin: Skin is warm and dry. No rash noted.  Psychiatric: She has a normal mood and affect. Her behavior is normal. Judgment and thought content normal.     Lab Results Last 24 Hours    Results for orders placed or performed during the hospital encounter of 01/04/16 (from the past 24 hour(s))  Renal function panel Status: Abnormal   Collection Time: 01/09/16 6:07 AM  Result Value Ref Range   Sodium 136 135 - 145 mmol/L   Potassium 3.8 3.5 - 5.1 mmol/L   Chloride 96 (L) 101 - 111 mmol/L   CO2 30 22 - 32 mmol/L   Glucose, Bld 95 65 - 99 mg/dL   BUN 18 6 - 20 mg/dL   Creatinine, Ser 1.42 (H) 0.44 - 1.00 mg/dL   Calcium 8.5 (L) 8.9 - 10.3 mg/dL   Phosphorus 2.7 2.5 - 4.6 mg/dL   Albumin 3.1 (L) 3.5 - 5.0 g/dL   GFR calc non Af Amer 44 (L) >60 mL/min   GFR calc Af Amer 51 (L) >60 mL/min   Anion gap 10 5 - 15      Imaging Results (Last 48 hours)    No results found.    Assessment/Plan: Diagnosis: L4-5 stenosis with myelopathy s/p decompression and fusion. Pt with residual lower extremity weakness and sensory deficits 1. Does the need for close, 24 hr/day medical supervision in concert with the patient's rehab needs make it unreasonable for this patient to be served in a less intensive setting? Yes 2. Co-Morbidities requiring supervision/potential complications: CKD, lumbar radiculopathy, morbid obesity 3. Due to bladder management, bowel management, safety, skin/wound care, disease  management, medication administration, pain management and patient education, does the patient require 24 hr/day rehab nursing? Yes 4. Does the patient require coordinated care of a physician, rehab nurse, PT (1-2 hrs/day, 5 days/week) and OT (1-2 hrs/day, 5 days/week) to address physical and functional deficits in the context of the above medical diagnosis(es)? Yes Addressing deficits in the following areas: balance, endurance, locomotion, strength, transferring, bowel/bladder control, bathing, dressing, feeding, grooming, toileting and psychosocial support 5. Can the patient actively participate in an intensive therapy program of at least 3 hrs of therapy per day at least 5 days per week? Yes 6. The potential for patient to make measurable gains while on inpatient rehab is excellent 7. Anticipated functional outcomes upon discharge from inpatient rehab are modified independent with PT, modified independent with OT, n/a with SLP. 8. Estimated rehab length of stay to reach the above functional goals is: 10-13 days 9. Does the patient have adequate social supports and living environment to accommodate these discharge functional goals? Yes 10. Anticipated D/C setting: Home 11. Anticipated post D/C treatments: Levasy therapy 12. Overall Rehab/Functional Prognosis: excellent  RECOMMENDATIONS: This patient's condition is appropriate for continued rehabilitative care in the following setting: CIR Patient has agreed to participate in recommended program. Yes Note that insurance prior authorization may be required for reimbursement for recommended care.  Comment: Rehab Admissions Coordinator to follow up.  Thanks,  Meredith Staggers, MD, Mellody Drown     01/09/2016       Revision History     Date/Time User Provider Type Action   01/09/2016 11:21 AM Meredith Staggers, MD Physician Addend   01/09/2016 11:20 AM Meredith Staggers, MD Physician Sign   01/09/2016 8:51 AM Bary Leriche, PA-C Physician  Assistant Share   View Details Report       Routing History     Date/Time From To Method   01/09/2016 11:21 AM Meredith Staggers, MD Manfred Shirts, PA Fax

## 2016-01-12 NOTE — H&P (Signed)
Physical Medicine and Rehabilitation Admission H&P    Chief Complaint  Patient presents with  . Back Pain  : HPI: Caitlin Taylor is a 45 y.o. female with history of morbid obesity, HTN,Chronic renal insufficiency creatinine 1.27-1.42, back pain X 2 years with RLE weakness and foot drop and treated with steroids and NSAIDS in the past. She was admitted via ED on 01/04/16 with 1-2 week history of LLE weakness and numbness. MRI spine showed severe L4/5 facet arthropathy with increasing effusions and mild widening/instability and stable facet synovial cyst. She was evaluated by Dr. Cyndy Freeze who felt that bilateral synovial cysts were causing severe stenosis and patient underwent L4/5 nerve root decompression with fusion on 01/06/16. Post op continues to be limted by BLE pain, BLE weakness with proprioceptive deficits and has difficulty with mobility.Back brace when out of bed applied in sitting position. Subcutaneous Lovenox for DVT prophylaxis. Acute blood loss anemia 10.6 and monitored.Noted follow getting up to the bathroom unattended 01/10/2016 with no injury sustained. PT/OT evaluations done yesterday and CIR recommended for follow up therapy.Patient was admitted for a comprehensive rehabilitation program  ROS HENT: Negative for hearing loss.  Eyes: Negative for blurred vision and double vision.  Respiratory: Positive for shortness of breath (with activity). Negative for cough and stridor.  Cardiovascular: Positive for leg swelling (chronic edema for years).  Gastrointestinal: Positive for constipation. Negative for heartburn, nausea and abdominal pain.  Genitourinary: Negative for dysuria and urgency.  Musculoskeletal: Positive for myalgias and back pain.  Skin: Negative for itching and rash.  Neurological: Positive for sensory change, focal weakness and weakness. Negative for dizziness, tingling and headaches.  Psychiatric/Behavioral: Negative for depression. The patient is  nervous/anxious (concerned about weakness and slow progress All other systems reviewed and negative    Past Medical History  Diagnosis Date  . Chronic back pain   . Hypertension    Past Surgical History  Procedure Laterality Date  . Cesarean section    . Maximum access (mas)posterior lumbar interbody fusion (plif) 1 level N/A 01/06/2016    Procedure: FOR MAXIMUM ACCESS (MAS) POSTERIOR LUMBAR INTERBODY FUSION (PLIF) LUMBAR FOUR-FIVE;  Surgeon: Kevan Ny Ditty, MD;  Location: [facility];  Service: Neurosurgery;  Laterality: N/A;   Family History  Problem Relation Age of Onset  . Congestive Heart Failure Father    Social History:  reports that she has never smoked. She does not have any smokeless tobacco history on file. She reports that she does not drink alcohol or use illicit drugs. Allergies: No Known Allergies Medications Prior to Admission  Medication Sig Dispense Refill  . Cyanocobalamin (VITAMIN B-12 PO) Take 1 tablet by mouth daily.    . cyclobenzaprine (FLEXERIL) 10 MG tablet Take 10 mg by mouth 2 (two) times daily.    . furosemide (LASIX) 20 MG tablet Take 80 mg by mouth daily.     Marland Kitchen gabapentin (NEURONTIN) 300 MG capsule Take 300 mg by mouth at bedtime.    Marland Kitchen lisinopril-hydrochlorothiazide (PRINZIDE,ZESTORETIC) 20-25 MG tablet Take 1 tablet by mouth daily.    . nitrofurantoin (MACRODANTIN) 100 MG capsule Take 100 mg by mouth 2 (two) times daily.     . potassium chloride SA (K-DUR,KLOR-CON) 20 MEQ tablet Take 20 mEq by mouth 2 (two) times daily.      Home: Home Living Family/patient expects to be discharged to:: Private residence (planning to go stay with her mother) Living Arrangements: Spouse/significant other, Children (35 y.o twins) Available Help at Discharge: Family, Available  24 hours/day (mother) Type of Home: House Home Access: Stairs to enter [employer] of Steps: 1 Entrance Stairs-Rails: None Home Layout: One level Bathroom Shower/Tub:  Chiropodist: Standard Home Equipment: Hand held shower head   Functional History: Prior Function Level of Independence: Needs assistance Gait / Transfers Assistance Needed: for a week and a half she has been using her mom's RW.  Mom uses RW at times too ADL's / Homemaking Assistance Needed: Assist with LB ADLs and IADLs for past 2 weeks  Functional Status:  Mobility: Bed Mobility Overal bed mobility: Needs Assistance Bed Mobility: Sidelying to Sit Rolling: Min assist Sidelying to sit: Min assist Sit to sidelying: Mod assist General bed mobility comments: OOB in chair upon arrival Transfers Overall transfer level: Needs assistance Equipment used: Rolling walker (2 wheeled) Transfers: Sit to/from Stand Sit to Stand: Min guard Stand pivot transfers: +2 safety/equipment, Min assist General transfer comment: min guard for safety; cues for hand placement with carry over demonstrated Ambulation/Gait Ambulation/Gait assistance: Min guard Ambulation Distance (Feet): 70 Feet Assistive device: Rolling walker (2 wheeled) Gait Pattern/deviations: Step-through pattern, Decreased stride length, Wide base of support, Trendelenburg General Gait Details: pt with weakness in bilat LE (R > L); cues for posture and increased step length; pt with DOE and HR increased to 138; one seated rest break; HR 120 upon end of session after rest--RN aware Gait velocity: NT, pt limited by pain.      ADL: ADL Overall ADL's : Needs assistance/impaired Grooming: Wash/dry hands, Wash/dry face, Set up, Sitting Upper Body Bathing: Set up, Sitting Lower Body Bathing: Moderate assistance, Sit to/from stand Upper Body Dressing : Minimal assistance, Sitting Upper Body Dressing Details (indicate cue type and reason): to don/doff back brace Lower Body Dressing: Moderate assistance, Sit to/from stand Lower Body Dressing Details (indicate cue type and reason): unable to cross ankle-over-knee Toilet  Transfer: Minimal assistance, Cueing for safety, Ambulation, BSC, RW Toilet Transfer Details (indicate cue type and reason): simulated to chair Toileting- Clothing Manipulation and Hygiene: Minimal assistance, Sit to/from stand Toileting - Clothing Manipulation Details (indicate cue type and reason): educated on use of wet wipes Functional mobility during ADLs: Minimal assistance, Rolling walker General ADL Comments: Session limited as pt's dressing was soaked and draining was leaking significantly.   Cognition: Cognition Overall Cognitive Status: Within Functional Limits for tasks assessed Orientation Level: Oriented X4 Cognition Arousal/Alertness: Awake/alert Behavior During Therapy: WFL for tasks assessed/performed Overall Cognitive Status: Within Functional Limits for tasks assessed  Physical Exam: Blood pressure 143/80, pulse 93, temperature 98.5 F (36.9 C), temperature source Oral, resp. rate 18, height 5' 3.6" (1.615 m), weight 122.108 kg (269 lb 3.2 oz), last menstrual period 12/28/2015, SpO2 100 %. Physical Exam Constitutional: She is oriented to person, place, and time. She appears well-developed and well-nourished.  Obese female. NAD  HENT:  Head: Normocephalic. Atraumatic.  Mouth/Throat: Oropharynx is clear and moist.  Eyes: Conjunctivae and EOM are normal. Pupils are equal, round, and reactive to light. No scleral icterus.  Neck: Neck supple.  Cardiovascular: Normal rate and regular rhythm.  No murmur heard. Respiratory: Effort normal and breath sounds normal. No stridor. No respiratory distress. She has no wheezes.  GI: Soft. Bowel sounds are normal. She exhibits no distension. There is no tenderness.  Musculoskeletal: She exhibits edema.  1-2+ edema BLE  Neurological: She is alert and oriented to person, place, and time. She displays abnormal reflex. Coordination abnormal.  Speech clear.  Follows commands without difficulty.  Sensory  deficits b/l feet.  Motor:  B/l UE 4+/5 proximal to distal LLE: Hip flexion 4/5, knee extension 4+/5, ankle dorsi/plantar flexion 4+/5 RLE: HF 4/5, KE 3+, ADF/PF 3/5.  Sensation diminished to light touch b/l feet.  Skin: Back incision with dressing c/d/i. No rash noted.  Psychiatric: She has a normal mood and affect. Her behavior is normal. Judgment and thought content normal   Results for orders placed or performed during the hospital encounter of 01/04/16 (from the past 48 hour(s))  Basic metabolic panel     Status: Abnormal   Collection Time: 01/10/16 10:05 AM  Result Value Ref Range   Sodium 136 135 - 145 mmol/L   Potassium 3.5 3.5 - 5.1 mmol/L   Chloride 98 (L) 101 - 111 mmol/L   CO2 29 22 - 32 mmol/L   Glucose, Bld 179 (H) 65 - 99 mg/dL   BUN 14 6 - 20 mg/dL   Creatinine, Ser 1.28 (H) 0.44 - 1.00 mg/dL   Calcium 8.5 (L) 8.9 - 10.3 mg/dL   GFR calc non Af Amer 50 (L) >60 mL/min   GFR calc Af Amer 58 (L) >60 mL/min    Comment: (NOTE) The eGFR has been calculated using the CKD EPI equation. This calculation has not been validated in all clinical situations. eGFR's persistently <60 mL/min signify possible Chronic Kidney Disease.    Anion gap 9 5 - 15  Basic metabolic panel     Status: Abnormal   Collection Time: 01/11/16  1:35 PM  Result Value Ref Range   Sodium 136 135 - 145 mmol/L   Potassium 3.3 (L) 3.5 - 5.1 mmol/L   Chloride 100 (L) 101 - 111 mmol/L   CO2 28 22 - 32 mmol/L   Glucose, Bld 141 (H) 65 - 99 mg/dL   BUN 12 6 - 20 mg/dL   Creatinine, Ser 1.28 (H) 0.44 - 1.00 mg/dL   Calcium 8.5 (L) 8.9 - 10.3 mg/dL   GFR calc non Af Amer 50 (L) >60 mL/min   GFR calc Af Amer 58 (L) >60 mL/min    Comment: (NOTE) The eGFR has been calculated using the CKD EPI equation. This calculation has not been validated in all clinical situations. eGFR's persistently <60 mL/min signify possible Chronic Kidney Disease.    Anion gap 8 5 - 15  Urinalysis, Routine w reflex microscopic (not at [facility])     Status:  Abnormal   Collection Time: 01/11/16  4:49 PM  Result Value Ref Range   Color, Urine YELLOW YELLOW   APPearance CLOUDY (A) CLEAR   Specific Gravity, Urine 1.016 1.005 - 1.030   pH 6.0 5.0 - 8.0   Glucose, UA NEGATIVE NEGATIVE mg/dL   Hgb urine dipstick LARGE (A) NEGATIVE   Bilirubin Urine NEGATIVE NEGATIVE   Ketones, ur NEGATIVE NEGATIVE mg/dL   Protein, ur NEGATIVE NEGATIVE mg/dL   Nitrite NEGATIVE NEGATIVE   Leukocytes, UA MODERATE (A) NEGATIVE  Urine microscopic-add on     Status: Abnormal   Collection Time: 01/11/16  4:49 PM  Result Value Ref Range   Squamous Epithelial / LPF 0-5 (A) NONE SEEN   WBC, UA 0-5 0 - 5 WBC/hpf   RBC / HPF NONE SEEN 0 - 5 RBC/hpf   Bacteria, UA FEW (A) NONE SEEN  Creatinine, serum     Status: Abnormal   Collection Time: 01/12/16  4:35 AM  Result Value Ref Range   Creatinine, Ser 1.20 (H) 0.44 - 1.00 mg/dL   GFR calc  non Af Amer 54 (L) >60 mL/min   GFR calc Af Amer >60 >60 mL/min    Comment: (NOTE) The eGFR has been calculated using the CKD EPI equation. This calculation has not been validated in all clinical situations. eGFR's persistently <60 mL/min signify possible Chronic Kidney Disease.    No results found.   Medical Problem List and Plan: 1.  Lower extremity weakness and sensory deficits secondary to L4-5 stenosis with myelopathy status post decompression and fusion 2.  DVT Prophylaxis/Anticoagulation: Subcutaneous Lovenox. Monitor for any bleeding episodes. Check vascular study 3. Pain Management: Neurontin 300 mg 3 times a day, OxyContin sustained release 20 mg every 12 hours, Robaxin and oxycodone as needed. Monitor with increased mobility 4. Acute blood loss anemia. Follow-up CBC 5. Neuropsych: This patient is capable of making decisions on her own behalf. 6. Skin/Wound Care: Routine skin checks 7. Fluids/Electrolytes/Nutrition: Routine I&O with follow-up chemistries 8.Hypertension. Lisinopril 10 mg daily, Lasix 80 mg daily. 9.  Chronic renal insufficiency. Creatinine 1.27-1.42. Follow-up chemistries 10. Morbid obesity. Follow-up dietary 11. Constipation. Laxative assistance  Post Admission Physician Evaluation: 1. Functional deficits secondary  to L4-5 stenosis with myelopathy status post decompression and fusion. 2. Patient is admitted to receive collaborative, interdisciplinary care between the physiatrist, rehab nursing staff, and therapy team. 3. Patient's level of medical complexity and substantial therapy needs in context of that medical necessity cannot be provided at a lesser intensity of care such as a SNF. 4. Patient has experienced substantial functional loss from his/her baseline which was documented above under the "Functional History" and "Functional Status" headings.  Judging by the patient's diagnosis, physical exam, and functional history, the patient has potential for functional progress which will result in measurable gains while on inpatient rehab.  These gains will be of substantial and practical use upon discharge  in facilitating mobility and self-care at the household level. 5. Physiatrist will provide 24 hour management of medical needs as well as oversight of the therapy plan/treatment and provide guidance as appropriate regarding the interaction of the two. 6. 24 hour rehab nursing will assist with bladder management, bowel management, safety, skin/wound care, disease management, medication administration, pain management and help integrate therapy concepts, techniques,education, etc. 7. PT will assess and treat for/with: Lower extremity strength, range of motion, stamina, balance, functional mobility, safety, adaptive techniques and equipment, woundcare, coping skills, pain control, education.   Goals are: Mod I. 8. OT will assess and treat for/with: ADL's, functional mobility, safety, upper extremity strength, adaptive techniques and equipment, wound mgt, ego support, and community reintegration.    Goals are: Mod I. Therapy may not proceed with showering this patient. 9. Case Management and Social Worker will assess and treat for psychological issues and discharge planning. 10. Team conference will be held weekly to assess progress toward goals and to determine barriers to discharge. 11. Patient will receive at least 3 hours of therapy per day at least 5 days per week. 12. ELOS: 6-10 days.       13. Prognosis:  good  Delice Lesch, MD 01/12/2016

## 2016-01-12 NOTE — PMR Pre-admission (Signed)
PMR Admission Coordinator Pre-Admission Assessment  Patient: Caitlin Taylor is an 45 y.o., female MRN: [id number] DOB: Sep 02, 1970 Height: 5' 3.6" (161.5 cm) Weight: 122.108 kg (269 lb 3.2 oz)              Insurance Information HMO:     PPO:      PCP:      IPA:      80/20:      OTHER:  PRIMARY: U[employer]       Policy#:  [id number]      Subscriber:  self CM Name:  Sharlett Iles      Phone#:  [phone]     Fax#:  EMR access available, no fax # Pre-Cert#:  [id number], authorized for admission 01/12/16; follow up will be via EMR access      Employer:  [employer] Benefits:  Phone #:  [phone]     Name:   Eff. Date:  07/12/09     Deduct:  $200      Out of Pocket Max:  $2250      Life Max:  n/a CIR:  90%/10%      SNF:  90%/10% Outpatient:  90%/10%     Co-Pay:  Home Health:  90%      Co-Pay: 10% DME:  90%     Co-Pay:  10% Providers:  In network SECONDARY:       Policy#:       Subscriber:  CM Name:       Phone#:      Fax#:  Pre-Cert#:       Employer:  Benefits:  Phone #:      Name:  Eff. Date:      Deduct:       Out of Pocket Max:       Life Max:  CIR:       SNF:  Outpatient:      Co-Pay:  Home Health:       Co-Pay:  DME:      Co-Pay:   Medicaid Application Date:       Case Manager:  Disability Application Date:       Case Worker:   Emergency Contact Information Contact Information    Name Relation Home Work Mobile   Easterly,Christopher Spouse [phone]  [phone]     Current Medical History  Patient Admitting Diagnosis: L4-5 stenosis with myelopathy s/p decompression and fusion. Pt with residual lower extremity weakness and sensory deficits History of Present Illness: Caitlin Taylor is a 45 y.o. female with history of morbid obesity, HTN,Chronic renal insufficiency creatinine 1.27-1.42, back pain X 2 years with RLE weakness and foot drop and treated with steroids and NSAIDS in the past. She was admitted via ED on 01/04/16 with 1-2 week history of LLE weakness and  numbness. MRI spine showed severe L4/5 facet arthropathy with increasing effusions and mild widening/instability and stable facet synovial cyst. She was evaluated by Dr. Cyndy Freeze who felt that bilateral synovial cysts were causing severe stenosis and patient underwent L4/5 nerve root decompression with fusion on 01/06/16. Post op continues to be limted by BLE pain, BLE weakness with proprioceptive deficits and has difficulty with mobility.Back brace when out of bed applied in sitting position. Subcutaneous Lovenox for DVT prophylaxis. Acute blood loss anemia 10.6 and monitored.Noted follow getting up to the bathroom unattended 01/10/2016 with no injury sustained. PT/OT evaluations done yesterday and CIR recommended for follow up therapy.Patient was admitted for a comprehensive rehabilitation program  Past Medical History  Past Medical History  Diagnosis Date  . Chronic back pain   . Hypertension     Family History  family history includes Congestive Heart Failure in her father.  Prior Rehab/Hospitalizations:  Has the patient had major surgery during 100 days prior to admission? No  Current Medications   Current facility-administered medications:  .  0.9 %  sodium chloride infusion, , Intravenous, Continuous, Kevan Ny Ditty, MD, Last Rate: 100 mL/hr at 01/07/16 1110 .  0.9 %  sodium chloride infusion, 250 mL, Intravenous, Continuous, Kevan Ny Ditty, MD .  acetaminophen (TYLENOL) tablet 650 mg, 650 mg, Oral, Q6H PRN **OR** acetaminophen (TYLENOL) suppository 650 mg, 650 mg, Rectal, Q6H PRN, Jani Gravel, MD .  acetaminophen (TYLENOL) tablet 1,000 mg, 1,000 mg, Oral, Q6H, Kevan Ny Ditty, MD, 1,000 mg at 01/12/16 1121 .  bisacodyl (DULCOLAX) EC tablet 5 mg, 5 mg, Oral, Daily PRN, Kevan Ny Ditty, MD .  diphenhydrAMINE (BENADRYL) capsule 25 mg, 25 mg, Oral, Q6H PRN, Jani Gravel, MD .  docusate sodium (COLACE) capsule 100 mg, 100 mg, Oral, BID, Kevan Ny Ditty, MD, 100  mg at 01/11/16 1113 .  enoxaparin (LOVENOX) injection 60 mg, 0.5 mg/kg, Subcutaneous, Q24H, Bonnell Public, MD, 60 mg at 01/12/16 1121 .  furosemide (LASIX) tablet 80 mg, 80 mg, Oral, Daily, Jani Gravel, MD, 80 mg at 01/12/16 1119 .  gabapentin (NEURONTIN) capsule 300 mg, 300 mg, Oral, TID, Kevan Ny Ditty, MD, 300 mg at 01/12/16 1119 .  lisinopril (PRINIVIL,ZESTRIL) tablet 10 mg, 10 mg, Oral, Daily, Bonnell Public, MD, 10 mg at 01/12/16 1120 .  menthol-cetylpyridinium (CEPACOL) lozenge 3 mg, 1 lozenge, Oral, PRN **OR** phenol (CHLORASEPTIC) mouth spray 1 spray, 1 spray, Mouth/Throat, PRN, Kevan Ny Ditty, MD .  methocarbamol (ROBAXIN) tablet 750 mg, 750 mg, Oral, QID, Kevan Ny Ditty, MD, 750 mg at 01/12/16 1119 .  ondansetron (ZOFRAN) injection 4 mg, 4 mg, Intravenous, Q4H PRN, Kevan Ny Ditty, MD, 4 mg at 01/08/16 0859 .  oxyCODONE (Oxy IR/ROXICODONE) immediate release tablet 5 mg, 5 mg, Oral, Q3H PRN, Kevan Ny Ditty, MD, 5 mg at 01/10/16 0620 .  oxyCODONE (OXYCONTIN) 12 hr tablet 20 mg, 20 mg, Oral, Q12H, Kevan Ny Ditty, MD, 20 mg at 01/12/16 1120 .  pantoprazole (PROTONIX) EC tablet 40 mg, 40 mg, Oral, Daily, Bonnell Public, MD, 40 mg at 01/12/16 1120 .  senna (SENOKOT) tablet 8.6 mg, 1 tablet, Oral, BID, Kevan Ny Ditty, MD, 8.6 mg at 01/11/16 1114 .  sodium chloride flush (NS) 0.9 % injection 3 mL, 3 mL, Intravenous, Q12H, Jani Gravel, MD, 3 mL at 01/11/16 2031 .  sodium chloride flush (NS) 0.9 % injection 3 mL, 3 mL, Intravenous, Q12H, Kevan Ny Ditty, MD, 3 mL at 01/12/16 1123 .  sodium chloride flush (NS) 0.9 % injection 3 mL, 3 mL, Intravenous, PRN, Kevan Ny Ditty, MD .  sodium phosphate (FLEET) 7-19 GM/118ML enema 1 enema, 1 enema, Rectal, Once PRN, Kevan Ny Ditty, MD .  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   Home Assistive Devices / Equipment Home Assistive Devices/Equipment: Eyeglasses, Environmental consultant (specify type) (front wheels) Home Equipment: Hand held shower head  Prior Device Use: Indicate devices/aids used by the patient prior to current illness, exacerbation or injury? Walker  Prior Functional Level Prior Function Level of Independence: Needs assistance Gait / Transfers Assistance Needed: for a week and a half she has been using her mom's RW.  Mom uses RW at times too ADL's / Homemaking Assistance Needed: Assist with LB ADLs and IADLs for past 2 weeks  Self Care: Did the patient need help bathing, dressing, using the toilet or eating?  Independent  Indoor Mobility: Did the patient need assistance with walking from room to room (with or without device)? Independent  Stairs: Did the patient need assistance with internal or external stairs (with or without device)? Independent  Functional Cognition: Did the patient need help planning regular tasks such as shopping or remembering to take medications? Independent  Current Functional Level Cognition  Overall Cognitive Status: Within Functional Limits for tasks assessed Orientation Level: Oriented X4    Extremity Assessment (includes Sensation/Coordination)  Upper Extremity Assessment: Defer to OT evaluation  Lower Extremity Assessment: RLE deficits/detail RLE Deficits / Details: right leg with significant weakness in right  ankle and decreased sensation to LT throughout lower leg.  trace ankle DF, weak ankle PF. Pt reports that this only recently got this bad and that she was not wearing a brace to help with her foot drop at baseline.  RLE Sensation: decreased light touch    ADLs  Overall ADL's : Needs assistance/impaired Grooming: Wash/dry hands, Wash/dry face, Set up, Sitting Upper Body Bathing: Set up, Sitting Lower Body Bathing: Moderate assistance, Sit to/from stand Upper Body Dressing : Minimal assistance, Sitting Upper Body Dressing Details (indicate cue type and reason): to don/doff back brace Lower Body Dressing: Moderate assistance, Sit to/from stand Lower Body Dressing Details (indicate cue type and reason): unable to cross ankle-over-knee Toilet Transfer: Minimal assistance, Cueing for safety, Ambulation, BSC, RW Toilet Transfer Details (indicate cue type and reason): simulated to chair Toileting- Clothing Manipulation and Hygiene: Minimal assistance, Sit to/from stand Toileting - Clothing Manipulation Details (indicate cue type and reason): educated on use of wet wipes Functional mobility during ADLs: Minimal assistance, Rolling walker General ADL Comments: Session limited as pt's dressing was soaked and draining was leaking significantly.     Mobility  Overal bed mobility: Needs Assistance Bed Mobility: Sidelying to Sit Rolling: Min assist Sidelying to sit: Min assist Sit to sidelying: Mod assist General bed mobility comments: at EOB on arrival    Transfers  Overall transfer level: Needs assistance Equipment used: Rolling walker (2 wheeled) Transfers: Sit to/from Stand Sit to Stand: Min guard Stand pivot transfers: Min assist General transfer comment: safety for mildly unstable knees, R LE    Ambulation / Gait / Stairs / Wheelchair Mobility  Ambulation/Gait Ambulation/Gait assistance: Museum/gallery curator (Feet): 80 Feet Assistive device: Rolling walker (2 wheeled) Gait  Pattern/deviations: Step-through pattern General Gait Details: Weak-Kneed gait with mild R foot drop and moderate steppage gait for clearance.  Heavier use of UE's as she fatigues   Gait velocity: slow Gait velocity interpretation: Below normal speed for age/gender    Posture / Balance Balance Overall balance assessment: Needs assistance Sitting-balance support: No upper extremity supported, Feet supported Sitting balance-Leahy  Scale: Good Standing balance support: Bilateral upper extremity supported, During functional activity Standing balance-Leahy Scale: Poor Standing balance comment: heavy reliance on UE support for balance during all tasks    Special needs/care consideration BiPAP/CPAP   no CPM   no Continuous Drip IV  no Dialysis   no        Life Vest   no Oxygen   no Special Bed   no Trach Size   no Wound Vac (area)   no      Skin lumbar surgical incision                               Bowel mgmt:   Last BM 01/11/16, continent Bladder mgmt: continent, walking to bathroom Diabetic mgmt n/a     Previous Home Environment Living Arrangements: Spouse/significant other, Children (55 y.o twins) Available Help at Discharge: Family, Available 24 hours/day (mother) Type of Home: House Home Layout: One level Home Access: Stairs to enter Entrance Stairs-Rails: None Entrance Stairs-Number of Steps: 1 Bathroom Shower/Tub: Chiropodist: Standard Home Care Services: No  Discharge Living Setting Plans for Discharge Living Setting: Other (Comment) (pt's mother's home) Type of Home at Discharge: House Discharge Home Layout: One level Discharge Home Access: Stairs to enter Entrance Stairs-Number of Steps: 1 (threshold only) Discharge Bathroom Shower/Tub: Walk-in shower Discharge Bathroom Toilet: Handicapped height Discharge Bathroom Accessibility: Yes How Accessible: Accessible via walker Does the patient have any problems obtaining your medications?:  No  Social/Family/Support Systems Patient Roles: Spouse, Parent Anticipated Caregiver: Lynzee Howle, husband as well as pt's mother Anticipated Caregiver's Contact Information: [phone] (husband) Ability/Limitations of Caregiver: works outside the home Caregiver Availability: 24/7 (pt's mother available 24/7) Does Caregiver/Family have Issues with Lodging/Transportation while Pt is in Rehab?: No   Goals/Additional Needs Patient/Family Goal for Rehab: modified independent PT/OT; n/a SLP Expected length of stay: 10-13 days Cultural Considerations: n/a Dietary Needs: heart diet with thin liquids Equipment Needs: TBA Pt/Family Agrees to Admission and willing to participate: Yes Program Orientation Provided & Reviewed with Pt/Caregiver Including Roles  & Responsibilities: Yes   Decrease burden of Care through IP rehab admission: n/a   Possible need for SNF placement upon discharge:   Not anticipated   Patient Condition: This patient's medical and functional status has changed since the consult dated: 01/09/16 in which the Rehabilitation Physician determined and documented that the patient's condition is appropriate for intensive rehabilitative care in an inpatient rehabilitation facility. See "History of Present Illness" (above) for medical update. Functional changes are: pt. Is ambulating min assist 47' with RW, foot drop present R, moderate steppage gait for clearnace, mod assist for LB ADLs . Patient's medical and functional status update has been discussed with the Rehabilitation physician and patient remains appropriate for inpatient rehabilitation. Will admit to inpatient rehab today.  Preadmission Screen Completed By:  Gerlean Ren, 01/12/2016 2:39 PM ______________________________________________________________________   Discussed status with Dr.  Posey Pronto on 01/12/16 at  1439  and received telephone approval for admission today.  Admission Coordinator:  Gerlean Ren, time [id number] /Date 01/12/16

## 2016-01-12 NOTE — Progress Notes (Signed)
Patient information reviewed and entered into eRehab system by Daoud Lobue, RN, CRRN, PPS Coordinator.  Information including medical coding and functional independence measure will be reviewed and updated through discharge.    

## 2016-01-12 NOTE — Progress Notes (Signed)
Inpatient Rehabilitation  I have received insurance approval and clearance from Dr. Marthenia Rolling to admit pt. to IP Rehab today.  Pt. Is very pleased.  I have updated pt's RN Irfa as well as Jacqualin Combes, RNCM and Domenica Reamer, CSW of the plan.  Pt. Will be admitted later today.  Please call if questions.  [facility] Admissions Coordinator Cell [phone] Office [phone]

## 2016-01-12 NOTE — Progress Notes (Signed)
Physical Therapy Treatment Patient Details Name: Caitlin Taylor MRN: [id number] DOB: 19-Oct-1970 Today's Date: 01/12/2016    History of Present Illness 45 y.o. female s/p L4-5 PLIF. PMH significant for chronic back pain, R foot drop, and HTN.    PT Comments    Pt progressing steadily.  Of concern is her dyspnea and high EHR 145 for minimal activity.  Also mild drop foot continues on the right and well as mild knee instability bil.   Follow Up Recommendations  CIR     Equipment Recommendations  Rolling walker with 5" wheels;3in1 (PT)    Recommendations for Other Services       Precautions / Restrictions Precautions Precautions: Back;Fall Precaution Booklet Issued: Yes (comment) Precaution Comments: able to recall 3/3 precautions Required Braces or Orthoses: Spinal Brace Spinal Brace: Applied in sitting position;Lumbar corset Restrictions Weight Bearing Restrictions: No    Mobility  Bed Mobility               General bed mobility comments: at EOB on arrival  Transfers Overall transfer level: Needs assistance Equipment used: Rolling walker (2 wheeled) Transfers: Sit to/from Stand Sit to Stand: Min guard Stand pivot transfers: Min assist       General transfer comment: safety for mildly unstable knees, R LE  Ambulation/Gait Ambulation/Gait assistance: Min assist Ambulation Distance (Feet): 80 Feet Assistive device: Rolling walker (2 wheeled) Gait Pattern/deviations: Step-through pattern Gait velocity: slow Gait velocity interpretation: Below normal speed for age/gender General Gait Details: Weak-Kneed gait with mild R foot drop and moderate steppage gait for clearance.  Heavier use of UE's as she fatigues     Financial trader Rankin (Stroke Patients Only)       Balance     Sitting balance-Leahy Scale: Good       Standing balance-Leahy Scale: Poor                      Cognition  Arousal/Alertness: Awake/alert Behavior During Therapy: WFL for tasks assessed/performed Overall Cognitive Status: Within Functional Limits for tasks assessed                      Exercises      General Comments General comments (skin integrity, edema, etc.): During ambulation, sats 95% and EHR 145 bpm,   dyspnea 3/4 and at times pt having difficulty talking.      Pertinent Vitals/Pain Pain Score: 4  Pain Location: incision site Pain Descriptors / Indicators: Aching Pain Intervention(s): Monitored during session;Repositioned    Home Living                      Prior Function            PT Goals (current goals can now be found in the care plan section) Acute Rehab PT Goals Patient Stated Goal: to be able to walk normally again PT Goal Formulation: With patient Time For Goal Achievement: 01/14/16 Potential to Achieve Goals: Good Progress towards PT goals: Progressing toward goals    Frequency  Min 5X/week    PT Plan Current plan remains appropriate    Co-evaluation             End of Session Equipment Utilized During Treatment: Back brace Activity Tolerance: Treatment limited secondary to medical complications (Comment) Patient left: in chair;with call bell/phone within reach;with family/visitor present  Time: [id number] PT Time Calculation (min) (ACUTE ONLY): 29 min  Charges:  $Gait Training: 8-22 mins $Therapeutic Activity: 8-22 mins                    G Codes:      Alcee Sipos, Tessie Fass 01/12/2016, 2:06 PM 01/12/2016  Donnella Sham, PT [phone]  (pager)

## 2016-01-12 NOTE — Discharge Summary (Signed)
Physician Discharge Summary  Caitlin Taylor [id number] DOB: 1970/07/13 DOA: 01/04/2016  PCP: Manfred Shirts, PA  Admit date: 01/04/2016 Discharge date: 01/12/2016  Time spent: 35 minutes  Recommendations for Outpatient Follow-up:  1. Follow up with PCP within one week of discharge from inpatient rehab ([facility]). Follow up with Neurosurgery as soon as possible. 2. Activity as per inpatient rehab  Discharge Diagnoses:  Active Problems:   Back pain   Hyperglycemia   Lumbar radiculopathy   UTI (urinary tract infection)   CKD (chronic kidney disease) stage 2, GFR 60-89 ml/min   Essential hypertension    Morbid obesity   AKI, resolved significantly  Discharge Condition: stable  Diet recommendation: Cardiac  Filed Weights   01/04/16 2041 01/05/16 0535  Weight: 121.11 kg (267 lb) 122.108 kg (269 lb 3.2 oz)    History of present illness: 45 year old female with a history of impaired glucose tolerance, hypertension, and chronic back pain presented with 4 day history of increasing bilateral lower extremity weakness and numbness. The patient stated that she had had back pain for the past 2 years but the back pain got worse in February 2017. The patient had a mechanical fall on 12/26/2015, and presented to ED at [facility]. The patient was discharged on prednisone which she finished on 01/03/2016. The patient represented to the ED at [facility] on 12/30/2015 and was diagnosed with UTI and started on nitrofurantoin. In the interim, the patient complained of increased right foot and leg weakness with numbness and tingling that had worsened to encompass her left lower extremity up to her knee. She hasdresorted to using a walker to ambulate. Upon presentation. MRI of the lumbar spine showed severe L4-5 facet arthropathy with increased facet effusions. There was also a left facet synovial cyst resulting in L4-5 mild canal stenosis. Neurosurgery was consulted in the emergency  department.    Hospital Course: Patient was admitted for further assessment and management. Neurosurgery team was consulted by ER physician. Patient underwent L4-5 posterior lumbar interbody fusion with titanium interbody cages and cortical trajectory pedicle screws at L4 and L5 bilaterally, use of morsellized allograft on 01/06/16. Patient will be discharged to inpatient rehab today.  During the hospital stay, patient was noted to have developed AKI post op. Nephrotoxic medications were held and patient was adequately hydrated. AKI has improved significantly. Last serum creatinine was 1.2 (Best documented Scr was 1.17). Patient is medically stable for discharge.  Procedures:  See above.  Consultations:  Neurosurgery  Rehab team  Discharge Exam: Filed Vitals:   01/11/16 2124 01/12/16 0554  BP: 122/74 143/80  Pulse: 114 93  Temp: 98.2 F (36.8 C) 98.5 F (36.9 C)  Resp: 19 18    General: Morbidly obese. Not in distress. AAO X 3. Cardiovascular: S1S2  Respiratory: Clear to auscultation.  Discharge Instructions: Activity as per rehab team. Cardiac/low fat diet.    Current Discharge Medication List    CONTINUE these medications which have NOT CHANGED   Details  Cyanocobalamin (VITAMIN B-12 PO) Take 1 tablet by mouth daily.    cyclobenzaprine (FLEXERIL) 10 MG tablet Take 10 mg by mouth 2 (two) times daily.    furosemide (LASIX) 20 MG tablet Take 80 mg by mouth daily.     gabapentin (NEURONTIN) 300 MG capsule Take 300 mg by mouth at bedtime.    lisinopril-hydrochlorothiazide (PRINZIDE,ZESTORETIC) 20-25 MG tablet Take 1 tablet by mouth daily.    nitrofurantoin (MACRODANTIN) 100 MG capsule Take 100 mg by mouth  2 (two) times daily.     potassium chloride SA (K-DUR,KLOR-CON) 20 MEQ tablet Take 20 mEq by mouth 2 (two) times daily.       No Known Allergies Follow-up Information    Follow up with Kevan Ny Ditty, MD In 3 weeks.   Specialty:  Neurosurgery   Contact  information:   [address] [place] [address] [phone]       Follow up with Manfred Shirts, PA In 1 week.   Specialty:  General Practice   Contact information:   [address] [place]  [id number] [phone]        The results of significant diagnostics from this hospitalization (including imaging, microbiology, ancillary and laboratory) are listed below for reference.    Significant Diagnostic Studies: Dg Lumbar Spine 2-3 Views  01/06/2016  CLINICAL DATA:  L4-5 PLIF. EXAM: Operative LUMBAR SPINE - 2-3 VIEW COMPARISON:  MRI lumbar spine 01/05/2016. FINDINGS: Two spot images from the C-arm fluoroscopic device, AP and lateral views of the lower lumbar spine are submitted for interpretation postoperatively. L4-5 PLIF and interbody fusion with anatomic alignment. The radiologic technologist documented 1 min of fluoroscopy time. IMPRESSION: L4-5 PLIF and interbody fusion with anatomic alignment. Electronically Signed   By: Evangeline Dakin M.D.   On: 01/06/2016 21:51   Mr Lumbar Spine Wo Contrast  01/05/2016  CLINICAL DATA:  Gradually worsening, acute on chronic constant back pain for 1 month. Pain worsening for 4 days. Reported synovial cyst of the spine. Numbness RIGHT leg. Recent falls. EXAM: MRI LUMBAR SPINE WITHOUT CONTRAST TECHNIQUE: Multiplanar, multisequence MR imaging of the lumbar spine was performed. No intravenous contrast was administered. COMPARISON:  MRI of the lumbar spine December 26, 2015 FINDINGS: Large body habitus results in decreased signal to noise ratio. OSSEOUS STRUCTURES: Lumbar vertebral bodies are intact and aligned with maintenance of lumbar lordosis. Intervertebral discs demonstrate normal morphology and signal characteristics. No abnormal bone marrow signal. SPINAL CORD: Conus medullaris terminates at L1-2 and demonstrates normal morphology and signal characteristics. Cauda equina is normal. SOFT TISSUES: Included prevertebral and paraspinal soft  tissues are normal. LEVEL BY LEVEL EVALUATION: L1-2 through L3-4:: No disc bulge, canal stenosis nor neural foraminal narrowing. L4-5: Severe facet arthropathy with increasing bilateral facet effusions, measuring up to 3 mm on the LEFT. 7 x 7 x 7 mm LEFT facet synovial cyst extending into the dorsal epidural space resulting in mild thecal sac effacement and canal stenosis. No neural foraminal narrowing. L5-S1: No disc bulge, canal stenosis nor neural foraminal narrowing. IMPRESSION: Severe L4-5 facet arthropathy with increasing facet effusions which are likely reactive. Mild L4-5 facet widening can be seen with dynamic instability. Similar 7 x 7 x 7 mm LEFT facet synovial cyst within dorsal epidural space resulting in L4-5 mild canal stenosis. No acute fracture or malalignment. Electronically Signed   By: Elon Alas M.D.   On: 01/05/2016 03:26   Dg C-arm 1-60 Min  01/06/2016  CLINICAL DATA:  L4-5 PLIF. EXAM: Operative LUMBAR SPINE - 2-3 VIEW COMPARISON:  MRI lumbar spine 01/05/2016. FINDINGS: Two spot images from the C-arm fluoroscopic device, AP and lateral views of the lower lumbar spine are submitted for interpretation postoperatively. L4-5 PLIF and interbody fusion with anatomic alignment. The radiologic technologist documented 1 min of fluoroscopy time. IMPRESSION: L4-5 PLIF and interbody fusion with anatomic alignment. Electronically Signed   By: Evangeline Dakin M.D.   On: 01/06/2016 21:51   Dg Scoliosis Eval Complete Spine Min 6  Views  01/05/2016  CLINICAL DATA:  Lumbar spinal stenosis. EXAM: DG SCOLIOSIS EVAL COMPLETE SPINE 6+V COMPARISON:  MRI of the lumbar spine dated 01/05/2016 FINDINGS: AP and lateral images of the entire spine were obtained and demonstrate no evidence of scoliosis. There is no bone destruction fracture. There is slight accentuation of the lumbar lordosis. IMPRESSION: 1. No scoliosis. 2. Slight accentuation of the lumbar lordosis. 3. Otherwise, normal exam. Electronically  Signed   By: Lorriane Shire M.D.   On: 01/05/2016 18:47    Microbiology: Recent Results (from the past 240 hour(s))  MRSA PCR Screening     Status: None   Collection Time: 01/05/16  4:26 PM  Result Value Ref Range Status   MRSA by PCR NEGATIVE NEGATIVE Final    Comment:        The GeneXpert MRSA Assay (FDA approved for NASAL specimens only), is one component of a comprehensive MRSA colonization surveillance program. It is not intended to diagnose MRSA infection nor to guide or monitor treatment for MRSA infections.   Surgical pcr screen     Status: None   Collection Time: 01/05/16  9:44 PM  Result Value Ref Range Status   MRSA, PCR NEGATIVE NEGATIVE Final   Staphylococcus aureus NEGATIVE NEGATIVE Final    Comment:        The Xpert SA Assay (FDA approved for NASAL specimens in patients over 48 years of age), is one component of a comprehensive surveillance program.  Test performance has been validated by [facility] for patients greater than or equal to 55 year old. It is not intended to diagnose infection nor to guide or monitor treatment.      Labs: Basic Metabolic Panel:  Recent Labs Lab 01/07/16 0621 01/08/16 0546 01/09/16 0607 01/10/16 1005 01/11/16 1335 01/12/16 0435  NA 138 135 136 136 136  --   K 3.9 3.9 3.8 3.5 3.3*  --   CL 98* 99* 96* 98* 100*  --   CO2 26 27 30 29 28   --   GLUCOSE 116* 101* 95 179* 141*  --   BUN 18 19 18 14 12   --   CREATININE 1.61* 1.61* 1.42* 1.28* 1.28* 1.20*  CALCIUM 8.5* 8.1* 8.5* 8.5* 8.5*  --   PHOS  --  3.9 2.7  --   --   --    Liver Function Tests:  Recent Labs Lab 01/08/16 0546 01/09/16 0607  ALBUMIN 3.2* 3.1*   No results for input(s): LIPASE, AMYLASE in the last 168 hours. No results for input(s): AMMONIA in the last 168 hours. CBC:  Recent Labs Lab 01/07/16 0621  WBC 12.3*  HGB 10.6*  HCT 32.4*  MCV 89.0  PLT 117*   Cardiac Enzymes: No results for input(s): CKTOTAL, CKMB, CKMBINDEX, TROPONINI  in the last 168 hours. BNP: BNP (last 3 results) No results for input(s): BNP in the last 8760 hours.  ProBNP (last 3 results) No results for input(s): PROBNP in the last 8760 hours.  CBG: No results for input(s): GLUCAP in the last 168 hours.     Signed:  Dana Allan, MD  [facility] Pager #: [phone] 7PM-7AM contact night coverage as above

## 2016-01-12 NOTE — Care Management Note (Signed)
Case Management Note  Patient Details  Name: Caitlin Taylor MRN: [id number] Date of Birth: 11/02/1970  Subjective/Objective:                    Action/Plan: Pt discharging to CIR. No further needs per CM.   Expected Discharge Date:                  Expected Discharge Plan:  [place]  In-House Referral:     Discharge planning Services  CM Consult  Post Acute Care Choice:  Durable Medical Equipment Choice offered to:  Patient  DME Arranged:  Walker rolling, 3-N-1 DME Agency:  [place]:    [place]:     Status of Service:  Completed, signed off  If discussed at [place] of Stay Meetings, dates discussed:    Additional Comments:  Pollie Friar, RN 01/12/2016, 12:23 PM

## 2016-01-12 NOTE — H&P (View-Only) (Signed)
Physical Medicine and Rehabilitation Admission H&P    Chief Complaint  Patient presents with  . Back Pain  : HPI: Caitlin Taylor is a 45 y.o. female with history of morbid obesity, HTN,Chronic renal insufficiency creatinine 1.27-1.42, back pain X 2 years with RLE weakness and foot drop and treated with steroids and NSAIDS in the past. She was admitted via ED on 01/04/16 with 1-2 week history of LLE weakness and numbness. MRI spine showed severe L4/5 facet arthropathy with increasing effusions and mild widening/instability and stable facet synovial cyst. She was evaluated by Dr. Cyndy Freeze who felt that bilateral synovial cysts were causing severe stenosis and patient underwent L4/5 nerve root decompression with fusion on 01/06/16. Post op continues to be limted by BLE pain, BLE weakness with proprioceptive deficits and has difficulty with mobility.Back brace when out of bed applied in sitting position. Subcutaneous Lovenox for DVT prophylaxis. Acute blood loss anemia 10.6 and monitored.Noted follow getting up to the bathroom unattended 01/10/2016 with no injury sustained. PT/OT evaluations done yesterday and CIR recommended for follow up therapy.Patient was admitted for a comprehensive rehabilitation program  ROS HENT: Negative for hearing loss.  Eyes: Negative for blurred vision and double vision.  Respiratory: Positive for shortness of breath (with activity). Negative for cough and stridor.  Cardiovascular: Positive for leg swelling (chronic edema for years).  Gastrointestinal: Positive for constipation. Negative for heartburn, nausea and abdominal pain.  Genitourinary: Negative for dysuria and urgency.  Musculoskeletal: Positive for myalgias and back pain.  Skin: Negative for itching and rash.  Neurological: Positive for sensory change, focal weakness and weakness. Negative for dizziness, tingling and headaches.  Psychiatric/Behavioral: Negative for depression. The patient is  nervous/anxious (concerned about weakness and slow progress All other systems reviewed and negative    Past Medical History  Diagnosis Date  . Chronic back pain   . Hypertension    Past Surgical History  Procedure Laterality Date  . Cesarean section    . Maximum access (mas)posterior lumbar interbody fusion (plif) 1 level N/A 01/06/2016    Procedure: FOR MAXIMUM ACCESS (MAS) POSTERIOR LUMBAR INTERBODY FUSION (PLIF) LUMBAR FOUR-FIVE;  Surgeon: Kevan Ny Ditty, MD;  Location: [facility];  Service: Neurosurgery;  Laterality: N/A;   Family History  Problem Relation Age of Onset  . Congestive Heart Failure Father    Social History:  reports that she has never smoked. She does not have any smokeless tobacco history on file. She reports that she does not drink alcohol or use illicit drugs. Allergies: No Known Allergies Medications Prior to Admission  Medication Sig Dispense Refill  . Cyanocobalamin (VITAMIN B-12 PO) Take 1 tablet by mouth daily.    . cyclobenzaprine (FLEXERIL) 10 MG tablet Take 10 mg by mouth 2 (two) times daily.    . furosemide (LASIX) 20 MG tablet Take 80 mg by mouth daily.     Marland Kitchen gabapentin (NEURONTIN) 300 MG capsule Take 300 mg by mouth at bedtime.    Marland Kitchen lisinopril-hydrochlorothiazide (PRINZIDE,ZESTORETIC) 20-25 MG tablet Take 1 tablet by mouth daily.    . nitrofurantoin (MACRODANTIN) 100 MG capsule Take 100 mg by mouth 2 (two) times daily.     . potassium chloride SA (K-DUR,KLOR-CON) 20 MEQ tablet Take 20 mEq by mouth 2 (two) times daily.      Home: Home Living Family/patient expects to be discharged to:: Private residence (planning to go stay with her mother) Living Arrangements: Spouse/significant other, Children (34 y.o twins) Available Help at Discharge: Family, Available  24 hours/day (mother) Type of Home: House Home Access: Stairs to enter Entrance Stairs-Number of Steps: 1 Entrance Stairs-Rails: None Home Layout: One level Bathroom Shower/Tub:  Tub/shower unit Bathroom Toilet: Standard Home Equipment: Hand held shower head   Functional History: Prior Function Level of Independence: Needs assistance Gait / Transfers Assistance Needed: for a week and a half she has been using her mom's RW.  Mom uses RW at times too ADL's / Homemaking Assistance Needed: Assist with LB ADLs and IADLs for past 2 weeks  Functional Status:  Mobility: Bed Mobility Overal bed mobility: Needs Assistance Bed Mobility: Sidelying to Sit Rolling: Min assist Sidelying to sit: Min assist Sit to sidelying: Mod assist General bed mobility comments: OOB in chair upon arrival Transfers Overall transfer level: Needs assistance Equipment used: Rolling walker (2 wheeled) Transfers: Sit to/from Stand Sit to Stand: Min guard Stand pivot transfers: +2 safety/equipment, Min assist General transfer comment: min guard for safety; cues for hand placement with carry over demonstrated Ambulation/Gait Ambulation/Gait assistance: Min guard Ambulation Distance (Feet): 70 Feet Assistive device: Rolling walker (2 wheeled) Gait Pattern/deviations: Step-through pattern, Decreased stride length, Wide base of support, Trendelenburg General Gait Details: pt with weakness in bilat LE (R > L); cues for posture and increased step length; pt with DOE and HR increased to 138; one seated rest break; HR 120 upon end of session after rest--RN aware Gait velocity: NT, pt limited by pain.      ADL: ADL Overall ADL's : Needs assistance/impaired Grooming: Wash/dry hands, Wash/dry face, Set up, Sitting Upper Body Bathing: Set up, Sitting Lower Body Bathing: Moderate assistance, Sit to/from stand Upper Body Dressing : Minimal assistance, Sitting Upper Body Dressing Details (indicate cue type and reason): to don/doff back brace Lower Body Dressing: Moderate assistance, Sit to/from stand Lower Body Dressing Details (indicate cue type and reason): unable to cross ankle-over-knee Toilet  Transfer: Minimal assistance, Cueing for safety, Ambulation, BSC, RW Toilet Transfer Details (indicate cue type and reason): simulated to chair Toileting- Clothing Manipulation and Hygiene: Minimal assistance, Sit to/from stand Toileting - Clothing Manipulation Details (indicate cue type and reason): educated on use of wet wipes Functional mobility during ADLs: Minimal assistance, Rolling walker General ADL Comments: Session limited as pt's dressing was soaked and draining was leaking significantly.   Cognition: Cognition Overall Cognitive Status: Within Functional Limits for tasks assessed Orientation Level: Oriented X4 Cognition Arousal/Alertness: Awake/alert Behavior During Therapy: WFL for tasks assessed/performed Overall Cognitive Status: Within Functional Limits for tasks assessed  Physical Exam: Blood pressure 143/80, pulse 93, temperature 98.5 F (36.9 C), temperature source Oral, resp. rate 18, height 5' 3.6" (1.615 m), weight 122.108 kg (269 lb 3.2 oz), last menstrual period 12/28/2015, SpO2 100 %. Physical Exam Constitutional: She is oriented to person, place, and time. She appears well-developed and well-nourished.  Obese female. NAD  HENT:  Head: Normocephalic. Atraumatic.  Mouth/Throat: Oropharynx is clear and moist.  Eyes: Conjunctivae and EOM are normal. Pupils are equal, round, and reactive to light. No scleral icterus.  Neck: Neck supple.  Cardiovascular: Normal rate and regular rhythm.  No murmur heard. Respiratory: Effort normal and breath sounds normal. No stridor. No respiratory distress. She has no wheezes.  GI: Soft. Bowel sounds are normal. She exhibits no distension. There is no tenderness.  Musculoskeletal: She exhibits edema.  1-2+ edema BLE  Neurological: She is alert and oriented to person, place, and time. She displays abnormal reflex. Coordination abnormal.  Speech clear.  Follows commands without difficulty.  Sensory   deficits b/l feet.  Motor:  B/l UE 4+/5 proximal to distal LLE: Hip flexion 4/5, knee extension 4+/5, ankle dorsi/plantar flexion 4+/5 RLE: HF 4/5, KE 3+, ADF/PF 3/5.  Sensation diminished to light touch b/l feet.  Skin: Back incision with dressing c/d/i. No rash noted.  Psychiatric: She has a normal mood and affect. Her behavior is normal. Judgment and thought content normal   Results for orders placed or performed during the hospital encounter of 01/04/16 (from the past 48 hour(s))  Basic metabolic panel     Status: Abnormal   Collection Time: 01/10/16 10:05 AM  Result Value Ref Range   Sodium 136 135 - 145 mmol/L   Potassium 3.5 3.5 - 5.1 mmol/L   Chloride 98 (L) 101 - 111 mmol/L   CO2 29 22 - 32 mmol/L   Glucose, Bld 179 (H) 65 - 99 mg/dL   BUN 14 6 - 20 mg/dL   Creatinine, Ser 1.28 (H) 0.44 - 1.00 mg/dL   Calcium 8.5 (L) 8.9 - 10.3 mg/dL   GFR calc non Af Amer 50 (L) >60 mL/min   GFR calc Af Amer 58 (L) >60 mL/min    Comment: (NOTE) The eGFR has been calculated using the CKD EPI equation. This calculation has not been validated in all clinical situations. eGFR's persistently <60 mL/min signify possible Chronic Kidney Disease.    Anion gap 9 5 - 15  Basic metabolic panel     Status: Abnormal   Collection Time: 01/11/16  1:35 PM  Result Value Ref Range   Sodium 136 135 - 145 mmol/L   Potassium 3.3 (L) 3.5 - 5.1 mmol/L   Chloride 100 (L) 101 - 111 mmol/L   CO2 28 22 - 32 mmol/L   Glucose, Bld 141 (H) 65 - 99 mg/dL   BUN 12 6 - 20 mg/dL   Creatinine, Ser 1.28 (H) 0.44 - 1.00 mg/dL   Calcium 8.5 (L) 8.9 - 10.3 mg/dL   GFR calc non Af Amer 50 (L) >60 mL/min   GFR calc Af Amer 58 (L) >60 mL/min    Comment: (NOTE) The eGFR has been calculated using the CKD EPI equation. This calculation has not been validated in all clinical situations. eGFR's persistently <60 mL/min signify possible Chronic Kidney Disease.    Anion gap 8 5 - 15  Urinalysis, Routine w reflex microscopic (not at [facility])     Status:  Abnormal   Collection Time: 01/11/16  4:49 PM  Result Value Ref Range   Color, Urine YELLOW YELLOW   APPearance CLOUDY (A) CLEAR   Specific Gravity, Urine 1.016 1.005 - 1.030   pH 6.0 5.0 - 8.0   Glucose, UA NEGATIVE NEGATIVE mg/dL   Hgb urine dipstick LARGE (A) NEGATIVE   Bilirubin Urine NEGATIVE NEGATIVE   Ketones, ur NEGATIVE NEGATIVE mg/dL   Protein, ur NEGATIVE NEGATIVE mg/dL   Nitrite NEGATIVE NEGATIVE   Leukocytes, UA MODERATE (A) NEGATIVE  Urine microscopic-add on     Status: Abnormal   Collection Time: 01/11/16  4:49 PM  Result Value Ref Range   Squamous Epithelial / LPF 0-5 (A) NONE SEEN   WBC, UA 0-5 0 - 5 WBC/hpf   RBC / HPF NONE SEEN 0 - 5 RBC/hpf   Bacteria, UA FEW (A) NONE SEEN  Creatinine, serum     Status: Abnormal   Collection Time: 01/12/16  4:35 AM  Result Value Ref Range   Creatinine, Ser 1.20 (H) 0.44 - 1.00 mg/dL   GFR calc  non Af Amer 54 (L) >60 mL/min   GFR calc Af Amer >60 >60 mL/min    Comment: (NOTE) The eGFR has been calculated using the CKD EPI equation. This calculation has not been validated in all clinical situations. eGFR's persistently <60 mL/min signify possible Chronic Kidney Disease.    No results found.   Medical Problem List and Plan: 1.  Lower extremity weakness and sensory deficits secondary to L4-5 stenosis with myelopathy status post decompression and fusion 2.  DVT Prophylaxis/Anticoagulation: Subcutaneous Lovenox. Monitor for any bleeding episodes. Check vascular study 3. Pain Management: Neurontin 300 mg 3 times a day, OxyContin sustained release 20 mg every 12 hours, Robaxin and oxycodone as needed. Monitor with increased mobility 4. Acute blood loss anemia. Follow-up CBC 5. Neuropsych: This patient is capable of making decisions on her own behalf. 6. Skin/Wound Care: Routine skin checks 7. Fluids/Electrolytes/Nutrition: Routine I&O with follow-up chemistries 8.Hypertension. Lisinopril 10 mg daily, Lasix 80 mg daily. 9.  Chronic renal insufficiency. Creatinine 1.27-1.42. Follow-up chemistries 10. Morbid obesity. Follow-up dietary 11. Constipation. Laxative assistance  Post Admission Physician Evaluation: 1. Functional deficits secondary  to L4-5 stenosis with myelopathy status post decompression and fusion. 2. Patient is admitted to receive collaborative, interdisciplinary care between the physiatrist, rehab nursing staff, and therapy team. 3. Patient's level of medical complexity and substantial therapy needs in context of that medical necessity cannot be provided at a lesser intensity of care such as a SNF. 4. Patient has experienced substantial functional loss from his/her baseline which was documented above under the "Functional History" and "Functional Status" headings.  Judging by the patient's diagnosis, physical exam, and functional history, the patient has potential for functional progress which will result in measurable gains while on inpatient rehab.  These gains will be of substantial and practical use upon discharge  in facilitating mobility and self-care at the household level. 5. Physiatrist will provide 24 hour management of medical needs as well as oversight of the therapy plan/treatment and provide guidance as appropriate regarding the interaction of the two. 6. 24 hour rehab nursing will assist with bladder management, bowel management, safety, skin/wound care, disease management, medication administration, pain management and help integrate therapy concepts, techniques,education, etc. 7. PT will assess and treat for/with: Lower extremity strength, range of motion, stamina, balance, functional mobility, safety, adaptive techniques and equipment, woundcare, coping skills, pain control, education.   Goals are: Mod I. 8. OT will assess and treat for/with: ADL's, functional mobility, safety, upper extremity strength, adaptive techniques and equipment, wound mgt, ego support, and community reintegration.    Goals are: Mod I. Therapy may not proceed with showering this patient. 9. Case Management and Social Worker will assess and treat for psychological issues and discharge planning. 10. Team conference will be held weekly to assess progress toward goals and to determine barriers to discharge. 11. Patient will receive at least 3 hours of therapy per day at least 5 days per week. 12. ELOS: 6-10 days.       13. Prognosis:  good  Delice Lesch, MD 01/12/2016

## 2016-01-12 NOTE — Progress Notes (Signed)
Gerlean Ren [facility]hab Admission Coordinator Signed Physical Medicine and Rehabilitation PMR Pre-admission 01/12/2016 12:49 PM  Related encounter: ED to Hosp-Admission (Current) from 01/04/2016 in [facility]   PMR Admission Coordinator Pre-Admission Assessment  Patient: Caitlin Taylor is an 45 y.o., female MRN: [id number] DOB: [birth year] Height: 5' 3.6" (161.5 cm) Weight: 122.108 kg (269 lb 3.2 oz)  Insurance Information HMO: PPO: PCP: IPA: 80/20: OTHER:  PRIMARY: UHC Policy#: [id number] Subscriber: self CM Name: Sharlett Iles Phone#[phone] Fax#: EMR access available, no fax # Pre-Cert#: [id number], authorized for admission 01/12/16; follow up will be via EMR access Employer: [employer] Benefits: Phone #: [phone] Name:  Eff. Date: 07/12/09 Deduct: $200 Out of Pocket Max: $2250 Life Max: n/a CIR: 90%/10% SNF: 90%/10% Outpatient: 90%/10% Co-Pay:  Home Health: 90% Co-Pay: 10% DME: 90% Co-Pay: 10% Providers: In network SECONDARY: Policy#: Subscriber:  CM Name: Phone#: Fax#:  Pre-Cert#: Employer:  Benefits: Phone #: Name:  Eff. Date: Deduct: Out of Pocket Max: Life Max:  CIR: SNF:  Outpatient: Co-Pay:  Home Health: Co-Pay:  DME: Co-Pay:   Medicaid Application Date: Case Manager:  Disability Application Date: Case Worker:   Emergency Contact Information Contact Information    Name Relation Home Work Mobile   Barreiro,Christopher Spouse [phone]  [phone]     Current Medical History  Patient Admitting Diagnosis: L4-5 stenosis with myelopathy s/p  decompression and fusion. Pt with residual lower extremity weakness and sensory deficits History of Present Illness: Caitlin Taylor is a 45 y.o. female with history of morbid obesity, HTN,Chronic renal insufficiency creatinine 1.27-1.42, back pain X 2 years with RLE weakness and foot drop and treated with steroids and NSAIDS in the past. She was admitted via ED on 01/04/16 with 1-2 week history of LLE weakness and numbness. MRI spine showed severe L4/5 facet arthropathy with increasing effusions and mild widening/instability and stable facet synovial cyst. She was evaluated by Dr. Cyndy Freeze who felt that bilateral synovial cysts were causing severe stenosis and patient underwent L4/5 nerve root decompression with fusion on 01/06/16. Post op continues to be limted by BLE pain, BLE weakness with proprioceptive deficits and has difficulty with mobility.Back brace when out of bed applied in sitting position. Subcutaneous Lovenox for DVT prophylaxis. Acute blood loss anemia 10.6 and monitored.Noted follow getting up to the bathroom unattended 01/10/2016 with no injury sustained. PT/OT evaluations done yesterday and CIR recommended for follow up therapy.Patient was admitted for a comprehensive rehabilitation program      Past Medical History  Past Medical History  Diagnosis Date  . Chronic back pain   . Hypertension     Family History  family history includes Congestive Heart Failure in her father.  Prior Rehab/Hospitalizations:  Has the patient had major surgery during 100 days prior to admission? No  Current Medications   Current facility-administered medications:  . 0.9 % sodium chloride infusion, , Intravenous, Continuous, Kevan Ny Ditty, MD, Last Rate: 100 mL/hr at 01/07/16 1110 . 0.9 % sodium chloride infusion, 250 mL, Intravenous, Continuous, Kevan Ny Ditty, MD . acetaminophen (TYLENOL) tablet 650 mg, 650 mg, Oral, Q6H PRN **OR** acetaminophen (TYLENOL)  suppository 650 mg, 650 mg, Rectal, Q6H PRN, Jani Gravel, MD . acetaminophen (TYLENOL) tablet 1,000 mg, 1,000 mg, Oral, Q6H, Kevan Ny Ditty, MD, 1,000 mg at 01/12/16 1121 . bisacodyl (DULCOLAX) EC tablet 5 mg, 5 mg, Oral, Daily PRN, Kevan Ny Ditty, MD . diphenhydrAMINE (BENADRYL) capsule 25 mg, 25 mg,  Oral, Q6H PRN, Jani Gravel, MD . docusate sodium (COLACE) capsule 100 mg, 100 mg, Oral, BID, Kevan Ny Ditty, MD, 100 mg at 01/11/16 1113 . enoxaparin (LOVENOX) injection 60 mg, 0.5 mg/kg, Subcutaneous, Q24H, Bonnell Public, MD, 60 mg at 01/12/16 1121 . furosemide (LASIX) tablet 80 mg, 80 mg, Oral, Daily, Jani Gravel, MD, 80 mg at 01/12/16 1119 . gabapentin (NEURONTIN) capsule 300 mg, 300 mg, Oral, TID, Kevan Ny Ditty, MD, 300 mg at 01/12/16 1119 . lisinopril (PRINIVIL,ZESTRIL) tablet 10 mg, 10 mg, Oral, Daily, Bonnell Public, MD, 10 mg at 01/12/16 1120 . menthol-cetylpyridinium (CEPACOL) lozenge 3 mg, 1 lozenge, Oral, PRN **OR** phenol (CHLORASEPTIC) mouth spray 1 spray, 1 spray, Mouth/Throat, PRN, Kevan Ny Ditty, MD . methocarbamol (ROBAXIN) tablet 750 mg, 750 mg, Oral, QID, Kevan Ny Ditty, MD, 750 mg at 01/12/16 1119 . ondansetron (ZOFRAN) injection 4 mg, 4 mg, Intravenous, Q4H PRN, Kevan Ny Ditty, MD, 4 mg at 01/08/16 0859 . oxyCODONE (Oxy IR/ROXICODONE) immediate release tablet 5 mg, 5 mg, Oral, Q3H PRN, Kevan Ny Ditty, MD, 5 mg at 01/10/16 0620 . oxyCODONE (OXYCONTIN) 12 hr tablet 20 mg, 20 mg, Oral, Q12H, Kevan Ny Ditty, MD, 20 mg at 01/12/16 1120 . pantoprazole (PROTONIX) EC tablet 40 mg, 40 mg, Oral, Daily, Bonnell Public, MD, 40 mg at 01/12/16 1120 . senna (SENOKOT) tablet 8.6 mg, 1 tablet, Oral, BID, Kevan Ny Ditty, MD, 8.6 mg at 01/11/16 1114 . sodium chloride flush (NS) 0.9 % injection 3 mL, 3 mL, Intravenous, Q12H, Jani Gravel, MD, 3 mL at 01/11/16 2031 . sodium chloride flush (NS) 0.9 % injection 3 mL, 3 mL,  Intravenous, Q12H, Kevan Ny Ditty, MD, 3 mL at 01/12/16 1123 . sodium chloride flush (NS) 0.9 % injection 3 mL, 3 mL, Intravenous, PRN, Kevan Ny Ditty, MD . sodium phosphate (FLEET) 7-19 GM/118ML enema 1 enema, 1 enema, Rectal, Once PRN, Kevan Ny Ditty, MD . 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   Home Assistive Devices / Equipment Home Assistive Devices/Equipment: Eyeglasses, Environmental consultant (specify type) (front wheels) Home Equipment: Hand held shower head  Prior Device Use: Indicate devices/aids used by the patient prior to current illness, exacerbation or injury? Walker  Prior Functional Level Prior Function Level of Independence: Needs assistance Gait / Transfers Assistance Needed: for a week and a half she has been using her mom's RW. Mom uses RW at times too ADL's / Homemaking Assistance Needed: Assist with LB ADLs and IADLs for past 2 weeks  Self Care: Did the patient need help bathing, dressing, using the toilet or eating? Independent  Indoor Mobility: Did the patient need assistance with walking from room to room (with or without device)? Independent  Stairs: Did the patient need assistance with internal or external stairs (with or without device)? Independent  Functional Cognition: Did the patient need help planning regular tasks such as  shopping or remembering to take medications? Independent  Current Functional Level Cognition  Overall Cognitive Status: Within Functional Limits for tasks assessed Orientation Level: Oriented X4   Extremity Assessment (includes Sensation/Coordination)  Upper Extremity Assessment: Defer to OT evaluation  Lower Extremity Assessment: RLE deficits/detail RLE Deficits / Details: right leg with significant weakness in right ankle and decreased sensation to LT throughout lower leg. trace ankle DF, weak ankle PF. Pt reports that this only recently got this bad and that she was not wearing a brace to help with her foot drop at baseline.  RLE Sensation: decreased light touch    ADLs  Overall ADL's : Needs assistance/impaired Grooming: Wash/dry hands, Wash/dry face, Set up, Sitting Upper Body Bathing: Set up, Sitting Lower Body Bathing: Moderate assistance, Sit to/from stand Upper Body Dressing : Minimal assistance, Sitting Upper Body Dressing Details (indicate cue type and reason): to don/doff back brace Lower Body Dressing: Moderate assistance, Sit to/from stand Lower Body Dressing Details (indicate cue type and reason): unable to cross ankle-over-knee Toilet Transfer: Minimal assistance, Cueing for safety, Ambulation, BSC, RW Toilet Transfer Details (indicate cue type and reason): simulated to chair Toileting- Clothing Manipulation and Hygiene: Minimal assistance, Sit to/from stand Toileting - Clothing Manipulation Details (indicate cue type and reason): educated on use of wet wipes Functional mobility during ADLs: Minimal assistance, Rolling walker General ADL Comments: Session limited as pt's dressing was soaked and draining was leaking significantly.     Mobility  Overal bed mobility: Needs Assistance Bed Mobility: Sidelying to Sit Rolling: Min assist Sidelying to sit: Min assist Sit to sidelying: Mod assist General bed mobility comments: at EOB on arrival     Transfers  Overall transfer level: Needs assistance Equipment used: Rolling walker (2 wheeled) Transfers: Sit to/from Stand Sit to Stand: Min guard Stand pivot transfers: Min assist General transfer comment: safety for mildly unstable knees, R LE    Ambulation / Gait / Stairs / Wheelchair Mobility  Ambulation/Gait Ambulation/Gait assistance: Museum/gallery curator (Feet): 80 Feet Assistive device: Rolling walker (2 wheeled) Gait Pattern/deviations: Step-through pattern General Gait Details: Weak-Kneed gait with mild R foot drop and moderate steppage gait for clearance. Heavier use of UE's as she fatigues  Gait velocity: slow Gait velocity interpretation: Below normal speed for age/gender    Posture / Balance Balance Overall balance assessment: Needs assistance Sitting-balance support: No upper extremity supported, Feet supported Sitting balance-Leahy Scale: Good Standing balance support: Bilateral upper extremity supported, During functional activity Standing balance-Leahy Scale: Poor Standing balance comment: heavy reliance on UE support for balance during all tasks    Special needs/care consideration BiPAP/CPAP no CPM no Continuous Drip IV no Dialysis no  Life Vest no Oxygen no Special Bed no Trach Size no Wound Vac (area) no  Skin lumbar surgical incision  Bowel mgmt: Last BM 01/11/16, continent Bladder mgmt: continent, walking to bathroom Diabetic mgmt n/a     Previous Home Environment Living Arrangements: Spouse/significant other, Children (27 y.o twins) Available Help at Discharge: Family, Available 24 hours/day (mother) Type of Home: House Home Layout: One level Home Access: Stairs to enter Entrance Stairs-Rails: None Entrance Stairs-Number of Steps: 1 Bathroom Shower/Tub: Chiropodist: Standard Home Care Services: No  Discharge Living Setting Plans for  Discharge Living Setting: Other (Comment) (pt's mother's home) Type of Home at Discharge: House Discharge Home Layout: One level Discharge Home Access: Stairs to enter Entrance Stairs-Number of Steps: 1 (threshold only) Discharge Bathroom Shower/Tub: Walk-in shower Discharge Bathroom Toilet: Handicapped height Discharge Bathroom Accessibility: Yes How Accessible: Accessible via  walker Does the patient have any problems obtaining your medications?: No  Social/Family/Support Systems Patient Roles: Spouse, Parent Anticipated Caregiver: Jenay Dehaan, husband as well as pt's mother Anticipated Caregiver's Contact Information: [phone] (husband) Ability/Limitations of Caregiver: works outside the home Caregiver Availability: 24/7 (pt's mother available 24/7) Does Caregiver/Family have Issues with Lodging/Transportation while Pt is in Rehab?: No   Goals/Additional Needs Patient/Family Goal for Rehab: modified independent PT/OT; n/a SLP Expected length of stay: 10-13 days Cultural Considerations: n/a Dietary Needs: heart diet with thin liquids Equipment Needs: TBA Pt/Family Agrees to Admission and willing to participate: Yes Program Orientation Provided & Reviewed with Pt/Caregiver Including Roles & Responsibilities: Yes   Decrease burden of Care through IP rehab admission: n/a   Possible need for SNF placement upon discharge: Not anticipated   Patient Condition: This patient's medical and functional status has changed since the consult dated: 01/09/16 in which the Rehabilitation Physician determined and documented that the patient's condition is appropriate for intensive rehabilitative care in an inpatient rehabilitation facility. See "History of Present Illness" (above) for medical update. Functional changes are: pt. Is ambulating min assist 79' with RW, foot drop present R, moderate steppage gait for clearnace, mod assist for LB ADLs . Patient's medical and functional  status update has been discussed with the Rehabilitation physician and patient remains appropriate for inpatient rehabilitation. Will admit to inpatient rehab today.  Preadmission Screen Completed By: Gerlean Ren, 01/12/2016 2:39 PM ______________________________________________________________________  Discussed status with Dr. Posey Pronto on 01/12/16 at 1439 and received telephone approval for admission today.  Admission Coordinator: Gerlean Ren, time [id number] /Date 01/12/16          Cosigned by: Ankit Lorie Phenix, MD at 01/12/2016 2:43 PM  Revision History

## 2016-01-12 NOTE — Interval H&P Note (Signed)
Caitlin Taylor was admitted today to Inpatient Rehabilitation with the diagnosis of L4-5 stenosis with myelopathy status post decompression and fusion.  The patient's history has been reviewed, patient examined, and there is no change in status.  Patient continues to be appropriate for intensive inpatient rehabilitation.  I have reviewed the patient's chart and labs.  Questions were answered to the patient's satisfaction. The PAPE has been reviewed and assessment remains appropriate.  Caitlin Taylor Lorie Phenix 01/12/2016, 9:35 PM

## 2016-01-13 ENCOUNTER — Inpatient Hospital Stay (HOSPITAL_COMMUNITY): Payer: 59 | Admitting: Occupational Therapy

## 2016-01-13 ENCOUNTER — Encounter (HOSPITAL_COMMUNITY): Payer: 59

## 2016-01-13 ENCOUNTER — Inpatient Hospital Stay (HOSPITAL_COMMUNITY): Payer: 59 | Admitting: Physical Therapy

## 2016-01-13 DIAGNOSIS — G959 Disease of spinal cord, unspecified: Secondary | ICD-10-CM

## 2016-01-13 DIAGNOSIS — N390 Urinary tract infection, site not specified: Secondary | ICD-10-CM

## 2016-01-13 DIAGNOSIS — D72829 Elevated white blood cell count, unspecified: Secondary | ICD-10-CM

## 2016-01-13 DIAGNOSIS — D62 Acute posthemorrhagic anemia: Secondary | ICD-10-CM

## 2016-01-13 DIAGNOSIS — N183 Chronic kidney disease, stage 3 unspecified: Secondary | ICD-10-CM | POA: Insufficient documentation

## 2016-01-13 DIAGNOSIS — G8918 Other acute postprocedural pain: Secondary | ICD-10-CM

## 2016-01-13 LAB — URINE CULTURE: Culture: >=100000 — AB

## 2016-01-13 MED ORDER — CEPHALEXIN 250 MG PO CAPS
500.0000 mg | ORAL_CAPSULE | Freq: Two times a day (BID) | ORAL | Status: AC
Start: 2016-01-13 — End: 2016-01-16
  Administered 2016-01-13 – 2016-01-16 (×7): 500 mg via ORAL
  Filled 2016-01-13 (×7): qty 2

## 2016-01-13 NOTE — IPOC Note (Signed)
Overall Plan of Care [facility]) Patient Details Name: ZOIEE KALINOWSKI MRN: [id number] DOB: [birth year]  Admitting Diagnosis: [place]-lumber-deccmp-Fusion  Hospital Problems: Active Problems:   Lumbar myelopathy ([place])   CKD (chronic kidney disease)   Acute lower UTI   Leukocytosis     Functional Problem List: Nursing Pain, Edema, Endurance, Medication Management, Skin Integrity  PT Balance, Edema, Endurance, Motor, Pain, Safety, Sensory  OT Balance, Endurance, Pain, Safety  SLP    TR         Basic ADL's: OT Grooming, Bathing, Dressing, Toileting     Advanced  ADL's: OT Simple Meal Preparation, Light Housekeeping, Laundry     Transfers: PT Bed Mobility, Bed to Chair, Musician, Manufacturing systems engineer, Metallurgist: PT Ambulation, Emergency planning/management officer, Stairs     Additional Impairments: OT None  SLP        TR      Anticipated Outcomes Item Anticipated Outcome  Self Feeding independent  Swallowing      Basic self-care  modified independent  Toileting  modified independent   Bathroom Transfers modified independent  Bowel/Bladder  cont of bowel and bladder with mod I assist  Transfers  Mod I  Locomotion  Mod I with LRAD  Communication     Cognition     Pain  Pain at or below level 5 with scheduled pain med  Safety/Judgment  Maintain safety during hospitalization with supervision/cues for precautions   Therapy Plan: PT Intensity: Minimum of 1-2 x/day ,45 to 90 minutes PT Frequency: 5 out of 7 days PT Duration Estimated Length of Stay: 7-10 days OT Intensity: Minimum of 1-2 x/day, 45 to 90 minutes OT Frequency: 5 out of 7 days OT Duration/Estimated Length of Stay: 7 days         Team Interventions: Nursing Interventions Patient/Family Education, Pain Management, Medication Management, Discharge Planning, Skin Care/Wound Management, Disease Management/Prevention  PT interventions Ambulation/gait training, Discharge planning, Functional mobility  training, Psychosocial support, Therapeutic Activities, Visual/perceptual remediation/compensation, Wheelchair propulsion/positioning, Therapeutic Exercise, Neuromuscular re-education, Skin care/wound management, Medical illustrator training, DME/adaptive equipment instruction, Pain management, UE/LE Strength taining/ROM, UE/LE Coordination activities, Stair training, Patient/family education, Community reintegration  OT Interventions Training and development officer, Academic librarian, Discharge planning, Functional mobility training, Engineer, drilling, Neuromuscular re-education, Self Care/advanced ADL retraining, Patient/family education, Therapeutic Activities, Pain management  SLP Interventions    TR Interventions    SW/CM Interventions Discharge Planning, Psychosocial Support, Patient/Family Education    Team Discharge Planning: Destination: PT-Home ,OT- Home , SLP-  Projected Follow-up: PT- (HHPT versus OPPT), OT-  Home health OT, SLP-  Projected Equipment Needs: PT-3 in 1 bedside comode, Rolling walker with 5" wheels, OT- To be determined, SLP-  Equipment Details: PT- , OT-  Patient/family involved in discharge planning: PT- Patient,  OT-Patient, SLP-   MD ELOS: ~7 days. Medical Rehab Prognosis:  Good Assessment:  45 y.o. female with history of morbid obesity, HTN, chronic renal insufficiency creatinine 1.27-1.42, back pain X 2 years with RLE weakness and foot drop and treated with steroids and NSAIDS in the past. She was admitted via ED on 01/04/16 with 1-2 week history of LLE weakness and numbness. MRI spine showed severe L4/5 facet arthropathy with increasing effusions and mild widening/instability and stable facet synovial cyst. She was evaluated by Dr. Cyndy Freeze who felt that bilateral synovial cysts were causing severe stenosis and patient underwent L4/5 nerve root decompression with fusion on 01/06/16. Post op limted by BLE pain, BLE weakness with proprioceptive  deficits and has  difficulty with mobility. Back brace when out of bed applied in sitting position. Acute blood loss anemia. Pt with resulting functional deficits with weakness, gait, and sensation.  Will set goals for Mod I with therapies.   See Team Conference Notes for weekly updates to the plan of care

## 2016-01-13 NOTE — Plan of Care (Signed)
Problem: RH Other (Specify) Goal: RH LTG Other (Specify)1 Pt will negotiate 12 steps with B rails & Mod I for BLE strengthening.

## 2016-01-13 NOTE — Progress Notes (Signed)
Bilateral lower extremity venous duplex order received 01/12/16. Study was recently completed 01/06/16- results are in [facility]. Please advise if repeat is necessary.  01/13/2016 8:09 AM Maudry Mayhew, RVT, RDCS, RDMS

## 2016-01-13 NOTE — Care Management Note (Signed)
[place] Individual Statement of Services  Patient Name:  Caitlin Taylor  Date:  01/13/2016  Welcome to the [facility].  Our goal is to provide you with an individualized program based on your diagnosis and situation, designed to meet your specific needs.  With this comprehensive rehabilitation program, you will be expected to participate in at least 3 hours of rehabilitation therapies Monday-Friday, with modified therapy programming on the weekends.  Your rehabilitation program will include the following services:  Physical Therapy (PT), Occupational Therapy (OT), 24 hour per day rehabilitation nursing, Therapeutic Recreaction (TR), Case Management (Social Worker), Rehabilitation Medicine, Nutrition Services and Pharmacy Services  Weekly team conferences will be held on Wednesday to discuss your progress.  Your Social Worker will talk with you frequently to get your input and to update you on team discussions.  Team conferences with you and your family in attendance may also be held.  Expected length of stay: 7-9 days Overall anticipated outcome: mod/i level  Depending on your progress and recovery, your program may change. Your Social Worker will coordinate services and will keep you informed of any changes. Your Social Worker's name and contact numbers are listed  below.  The following services may also be recommended but are not provided by the [place] will be made to provide these services after discharge if needed.  Arrangements include referral to agencies that provide these services.  Your insurance has been verified to be:  [facility] Your primary doctor is:  Lanier Prude  Pertinent information will be shared with your doctor and your insurance company.  Social Worker:  Ovidio Kin, [place] or ([phone]  Information discussed with and copy given to patient by: [name], 01/13/2016, 9:25 AM

## 2016-01-13 NOTE — Evaluation (Signed)
Physical Therapy Assessment and Plan  Patient Details  Name: Caitlin Taylor MRN: [id number] Date of Birth: 01-14-1971  PT Diagnosis: Abnormality of gait, Difficulty walking, Impaired sensation, Muscle weakness and Pain in back Rehab Potential: Good ELOS: 7-10 days   Today's Date: 01/13/2016 PT Individual Time: 1100-1200 PT Individual Time Calculation (min): 60 min    Problem List:  Patient Active Problem List   Diagnosis Date Noted  . CKD (chronic kidney disease)   . Acute lower UTI   . Leukocytosis   . Lumbar myelopathy ([place]) 01/12/2016  . Low back pain   . Lumbar stenosis   . S/P lumbar fusion   . Post-operative pain   . Acute blood loss anemia   . Constipation due to pain medication   . Morbid obesity due to excess calories ([place])   . Back pain 01/05/2016  . Renal insufficiency 01/05/2016  . Hyperglycemia 01/05/2016  . Lumbar radiculopathy 01/05/2016  . UTI (urinary tract infection) 01/05/2016  . CKD (chronic kidney disease) stage 2, GFR 60-89 ml/min 01/05/2016  . Essential hypertension 01/05/2016    Past Medical History:  Past Medical History  Diagnosis Date  . Chronic back pain   . Hypertension    Past Surgical History:  Past Surgical History  Procedure Laterality Date  . Cesarean section    . Maximum access (mas)posterior lumbar interbody fusion (plif) 1 level N/A 01/06/2016    Procedure: FOR MAXIMUM ACCESS (MAS) POSTERIOR LUMBAR INTERBODY FUSION (PLIF) LUMBAR FOUR-FIVE;  Surgeon: Kevan Ny Ditty, MD;  Location: [facility];  Service: Neurosurgery;  Laterality: N/A;    Assessment & Plan Clinical Impression: Patient is a 45 y.o. year old female with history of morbid obesity, HTN,Chronic renal insufficiency creatinine 1.27-1.42, back pain X 2 years with RLE weakness and foot drop and treated with steroids and NSAIDS in the past. She was admitted via ED on 01/04/16 with 1-2 week history of LLE weakness and numbness. MRI spine showed severe L4/5 facet  arthropathy with increasing effusions and mild widening/instability and stable facet synovial cyst. She was evaluated by Dr. Cyndy Freeze who felt that bilateral synovial cysts were causing severe stenosis and patient underwent L4/5 nerve root decompression with fusion on 01/06/16. Post op continues to be limted by BLE pain, BLE weakness with proprioceptive deficits and has difficulty with mobility.Back brace when out of bed applied in sitting position. Subcutaneous Lovenox for DVT prophylaxis. Acute blood loss anemia 10.6 and monitored.Noted follow getting up to the bathroom unattended 01/10/2016 with no injury sustained. PT/OT evaluations done yesterday and CIR recommended for follow up therapy.Patient was admitted for a comprehensive rehabilitation program Patient transferred to CIR on 01/12/2016 .   Patient currently requires min with mobility secondary to muscle weakness, decreased cardiorespiratoy endurance and decreased standing balance and difficulty maintaining precautions.  Prior to hospitalization, patient was independent  with mobility and lived with  (lives with spouse & kids but plans to d/c home with mother) in a House home.  Home access is  Stairs to enter (single threshold step ~3 inches).  Patient will benefit from skilled PT intervention to maximize safe functional mobility, minimize fall risk and decrease caregiver burden for planned discharge home with intermittent assist.  Anticipate patient will HHPT versus OPPT at discharge.  PT - End of Session Activity Tolerance: Tolerates 30+ min activity with multiple rests Endurance Deficit: Yes Endurance Deficit Description: 2/2 decreased cardiorespiratory status PT Assessment Rehab Potential (ACUTE/IP ONLY): Good Barriers to Discharge: Decreased caregiver support (mother only able  to provide intermittent supervision assist) PT Patient demonstrates impairments in the following area(s): Balance;Edema;Endurance;Motor;Pain;Safety;Sensory PT Transfers  Functional Problem(s): Bed Mobility;Bed to Chair;Car;Furniture PT Locomotion Functional Problem(s): Ambulation;Wheelchair Mobility;Stairs PT Plan PT Intensity: Minimum of 1-2 x/day ,45 to 90 minutes PT Frequency: 5 out of 7 days PT Duration Estimated Length of Stay: 7-10 days PT Treatment/Interventions: Ambulation/gait training;Discharge planning;Functional mobility training;Psychosocial support;Therapeutic Activities;Visual/perceptual remediation/compensation;Wheelchair propulsion/positioning;Therapeutic Exercise;Neuromuscular re-education;Skin care/wound management;Balance/vestibular training;DME/adaptive equipment instruction;Pain management;UE/LE Strength taining/ROM;UE/LE Coordination activities;Stair training;Patient/family education;Community reintegration PT Transfers Anticipated Outcome(s): Mod I PT Locomotion Anticipated Outcome(s): Mod I with LRAD PT Recommendation Follow Up Recommendations:  (HHPT versus OPPT) Patient destination: Home Equipment Recommended: 3 in 1 bedside comode;Rolling walker with 5" wheels  Skilled Therapeutic Intervention Pt received in recliner denying c/o pain at rest but noting 6/10 in back with activity. PT evaluation initiated; please see below for all manual testing details. Educated pt on various aspects of CIR & schedule. Pt able to ambulate 165ft with RW & Min A and multiple standing rest breaks 2/2 SOB; educated pt on pursed lip breathing. Pt with significant R knee instability, trendelenburg gait, decreased step length BLE, & decreased gait speed. Pt able to propel w/c x 30 ft with BUE & supervision for linear path, negotiate 12 steps (3" + 6") with B rails & Min A, & ambulate over uneven surface with RW & Min A. Pt reports numbness & tingling in BLE with dorsiflexion (R>L). At end of session pt left on toilet with NA aware & pt educated to call for nursing assistance prior to standing up. Also educated pt on need for medical clearance before her return to  driving & pt voiced understanding.  PT Evaluation Precautions/Restrictions Precautions Precautions: Fall;Back Spinal Brace: Lumbar corset;Applied in sitting position Restrictions Weight Bearing Restrictions: No   General Response to Previous Treatment: Patient with no complaints from previous session. Family/Caregiver Present: No   Vital Signs Therapy Vitals Pulse Rate: (!) 116 (after ambulating) Patient Position (if appropriate): Sitting Oxygen Therapy SpO2: 100 % O2 Device: Not Delivered  Pain Pain Assessment Pain Assessment: 0-10 Pain Score: 6  Pain Type: Surgical pain Pain Location: Back Pain Orientation: Lower;Mid Pain Onset: With Activity Pain Intervention(s): Ambulation/increased activity (premedicated)  Home Living/Prior Functioning Home Living Living Arrangements: Spouse/significant other;Children Available Help at Discharge: Family;Available PRN/intermittently (mother is 9 y/o & can provide supervision but not physical assistance) Type of Home: House Home Access: Stairs to enter (single threshold step ~3 inches) Entrance Stairs-Rails: None Home Layout: One level Bathroom Shower/Tub: Walk-in shower;Tub/shower unit Bathroom Toilet: Handicapped height  Lives With:  (lives with spouse & kids but plans to d/c home with mother) Prior Function Level of Independence: Independent with basic ADLs;Independent with homemaking with ambulation;Independent with transfers;Independent with gait  Able to Take Stairs?: Yes Driving: Yes Vocation: Full time employment (works at [employer]) [employer]:  (office setting) Leisure: Hobbies-yes (Comment) Comments: spending time with family, cooking, cares for 45 y/o twins  Vision/Perception    Pt wears glasses at all times at baseline. Pt denies any changes in vision since admission to CIR.  Cognition Overall Cognitive Status: Within Functional Limits for tasks assessed Arousal/Alertness:  Awake/alert Orientation Level: Oriented X4;Oriented to person;Oriented to place;Oriented to time;Oriented to situation Attention: Focused Focused Attention: Appears intact Memory: Appears intact Awareness: Appears intact Problem Solving: Appears intact Safety/Judgment: Appears intact  Sensation Sensation Light Touch: Impaired by gross assessment (decreased sensation medial & dorsal aspect R foot) Proprioception: Appears Intact (BLE)  Motor  Motor Motor -  Skilled Clinical Observations:  (significant edema BLE)   Mobility Transfers Transfers: Yes Sit to Stand: 4: Min assist Stand to Sit: 4: Min assist  Locomotion  Ambulation Ambulation: Yes Ambulation/Gait Assistance: 4: Min assist Ambulation Distance (Feet): 100 Feet Assistive device: Rolling walker Gait Gait: Yes Gait Pattern: Decreased step length - right;Decreased step length - left;Trendelenburg (R knee buckling) Stairs / Additional Locomotion Stairs: Yes Stairs Assistance: 4: Min assist Stairs Assistance Details: Verbal cues for sequencing Stair Management Technique: Two rails Number of Stairs: 12 Height of Stairs:  (6 inches +3 inches) Wheelchair Mobility Wheelchair Mobility: Yes Wheelchair Assistance: 5: Supervision (linear path in hallway) Wheelchair Assistance Details: Verbal cues for sequencing;Verbal cues for Marketing executive: Both upper extremities Wheelchair Parts Management: Needs assistance Distance: 30   Balance Balance Balance Assessed: Yes Static Standing Balance Static Standing - Balance Support: Bilateral upper extremity supported Static Standing - Level of Assistance: 5: Stand by assistance  Extremity Assessment  RLE Assessment RLE Assessment: Exceptions to [facility] RLE AROM (degrees) Overall AROM Right Lower Extremity: Within functional limits for tasks assessed RLE Strength Right Hip Flexion: 3/5 Right Knee Extension: 2/5 Right Ankle Dorsiflexion: 2-/5 (c/o  numbness/tingling in R LE with dorsiflexing) LLE Assessment LLE Assessment: Exceptions to [facility] LLE Strength Left Hip Flexion: 3+/5 Left Knee Flexion: 3+/5 Left Knee Extension: 4/5 Left Ankle Dorsiflexion: 3/5 (c/o mild numbness & tingling in L ankle with dorsiflexion)   See Function Navigator for Current Functional Status.   Refer to Care Plan for Long Term Goals  Recommendations for other services: None  Discharge Criteria: Patient will be discharged from PT if patient refuses treatment 3 consecutive times without medical reason, if treatment goals not met, if there is a change in medical status, if patient makes no progress towards goals or if patient is discharged from hospital.  The above assessment, treatment plan, treatment alternatives and goals were discussed and mutually agreed upon: by patient  Waunita Schooner 01/13/2016, 11:39 AM

## 2016-01-13 NOTE — Progress Notes (Signed)
[facility] PHYSICAL MEDICINE & REHABILITATION     PROGRESS NOTE  Subjective/Complaints:  Pt very pleasant and positive.  She states she had a good 1st night and is ready to begin her day of therapies today.   ROS: Denies CP, SOB, N/V/D.  Objective: Vital Signs: Blood pressure 134/73, pulse 97, temperature 98.5 F (36.9 C), temperature source Oral, resp. rate 18, height 5\' 3"  (1.6 m), weight 124 kg (273 lb 5.9 oz), last menstrual period 12/28/2015, SpO2 98 %. No results found.  Recent Labs  01/12/16 1842  WBC 11.8*  HGB 9.7*  HCT 29.7*  PLT 194    Recent Labs  01/11/16 1335 01/12/16 0435 01/12/16 1842  NA 136  --  135  K 3.3*  --  3.5  CL 100*  --  99*  GLUCOSE 141*  --  85  BUN 12  --  15  CREATININE 1.28* 1.20* 1.26*  CALCIUM 8.5*  --  8.7*   CBG (last 3)  No results for input(s): GLUCAP in the last 72 hours.  Wt Readings from Last 3 Encounters:  01/12/16 124 kg (273 lb 5.9 oz)  01/05/16 122.108 kg (269 lb 3.2 oz)    Physical Exam:  BP 134/73 mmHg  Pulse 97  Temp(Src) 98.5 F (36.9 C) (Oral)  Resp 18  Ht 5\' 3"  (1.6 m)  Wt 124 kg (273 lb 5.9 oz)  BMI 48.44 kg/m2  SpO2 98%  LMP 12/28/2015 Constitutional: She appears well-developed and well-nourished. Obese. NAD  HENT: Normocephalic. Atraumatic.  Eyes: Conjunctivae and EOM are normal.  Cardiovascular: Normal rate and regular rhythm. No murmur heard. Respiratory: Effort normal and breath sounds normal. No stridor. No respiratory distress. She has no wheezes.  GI: Soft. Bowel sounds are normal. She exhibits no distension. There is no tenderness.  Musculoskeletal: She exhibits edema. BLE  Neurological: She is alert and oriented.   Speech clear.  Follows commands without difficulty.  Sensory deficits b/l feet.  Motor: B/l UE 4+/5 proximal to distal LLE: Hip flexion 4/5, knee extension 4+/5, ankle dorsi/plantar flexion 4+/5 RLE: HF 4/5, KE 3+, ADF/PF 3/5.  Skin: Back incision with dressing  c/d/i. Warm and dry. Psychiatric: She has a normal mood and affect. Her behavior is normal. Judgment and thought content normal   Assessment/Plan: 1. Functional deficits secondary to to L4-5 stenosis with myelopathy status post decompression and fusion which require 3+ hours per day of interdisciplinary therapy in a comprehensive inpatient rehab setting. Physiatrist is providing close team supervision and 24 hour management of active medical problems listed below. Physiatrist and rehab team continue to assess barriers to discharge/monitor patient progress toward functional and medical goals.  Function:  Bathing Bathing position      Bathing parts      Bathing assist        Upper Body Dressing/Undressing Upper body dressing                    Upper body assist        Lower Body Dressing/Undressing Lower body dressing                                  Lower body assist        Toileting Toileting          Toileting assist     Transfers Chair/bed transfer  Locomotion Ambulation           Wheelchair          Cognition Comprehension Comprehension assist level: Follows complex conversation/direction with no assist  Expression Expression assist level: Expresses complex ideas: With no assist  Social Interaction Social Interaction assist level: Interacts appropriately with others - No medications needed.  Problem Solving Problem solving assist level: Solves complex problems: Recognizes & self-corrects  Memory Memory assist level: Complete Independence: No helper    Medical Problem List and Plan: 1. Lower extremity weakness and sensory deficits secondary to L4-5 stenosis with myelopathy status post decompression and fusion  Begin CIR 2. DVT Prophylaxis/Anticoagulation: Subcutaneous Lovenox. Monitor for any bleeding episodes.  3. Pain Management: Neurontin 300 mg 3 times a day, OxyContin sustained release 20 mg every 12 hours,  Robaxin and oxycodone as needed.   Monitor with increased mobility  Will wean as tolerated 4. Acute blood loss anemia.   Hb 9.7 on 7/3  Cont to monitor 5. Neuropsych: This patient is capable of making decisions on her own behalf. 6. Skin/Wound Care: Routine skin checks 7. Fluids/Electrolytes/Nutrition: Routine I&O  8.Hypertension. Cont Lisinopril 10 mg daily, Lasix 80 mg daily. 9. Chronic renal insufficiency. Creatinine 1.27-1.42.   Cr. 1.26 on 7/4 10. Morbid obesity. Follow-up dietary 11. Constipation. Laxative assistance 12. UTI  Ucx from 7/2 with Proteus  Will start empiric keflex, while awaiting Ucx 13. Leukocytosis  Likely secondary to UTI  Will cont to monitor  LOS (Days) 1 A FACE TO FACE EVALUATION WAS PERFORMED  Duron Meister Lorie Phenix 01/13/2016 8:29 AM

## 2016-01-13 NOTE — Plan of Care (Signed)
Problem: RH Balance Goal: LTG Patient will maintain dynamic standing balance (PT) LTG: Patient will maintain dynamic standing balance with assistance during mobility activities (PT) With LRAD  Problem: RH Bed to Chair Transfers Goal: LTG Patient will perform bed/chair transfers w/assist (PT) LTG: Patient will perform bed/chair transfers with assistance, with/without cues (PT). With LRAD  Problem: RH Car Transfers Goal: LTG Patient will perform car transfers with assist (PT) LTG: Patient will perform car transfers with assistance (PT). With LRAD  Problem: RH Furniture Transfers Goal: LTG Patient will perform furniture transfers w/assist (OT/PT LTG: Patient will perform furniture transfers with assistance (OT/PT). With LRAD  Problem: RH Ambulation Goal: LTG Patient will ambulate in controlled environment (PT) LTG: Patient will ambulate in a controlled environment, # of feet with assistance (PT). 150 ft with LRAD Goal: LTG Patient will ambulate in home environment (PT) LTG: Patient will ambulate in home environment, # of feet with assistance (PT). 61 ft with LRAD Goal: LTG Patient will ambulate in community environment (PT) LTG: Patient will ambulate in community environment, # of feet with assistance (PT). 150 ft with LRAD  Problem: RH Stairs Goal: LTG Patient will ambulate up and down stairs w/assist (PT) LTG: Patient will ambulate up and down # of stairs with assistance (PT) Single step (3 inches) with LRAD for [employer]

## 2016-01-13 NOTE — Evaluation (Signed)
Occupational Therapy Assessment and Plan  Patient Details  Name: Caitlin Taylor MRN: [id number] Date of Birth: [birth year]  OT Diagnosis: acute pain, lumbago (low back pain) and muscle weakness (generalized) Rehab Potential: Rehab Potential (ACUTE ONLY): Excellent ELOS: 7 days   Today's Date: 01/13/2016 OT Individual Time: 6301-6010 OT Individual Time Calculation (min): 75 min     Problem List:  Patient Active Problem List   Diagnosis Date Noted  . CKD (chronic kidney disease)   . Acute lower UTI   . Leukocytosis   . Lumbar myelopathy ([place]) 01/12/2016  . Low back pain   . Lumbar stenosis   . S/P lumbar fusion   . Post-operative pain   . Acute blood loss anemia   . Constipation due to pain medication   . Morbid obesity due to excess calories ([place])   . Back pain 01/05/2016  . Renal insufficiency 01/05/2016  . Hyperglycemia 01/05/2016  . Lumbar radiculopathy 01/05/2016  . UTI (urinary tract infection) 01/05/2016  . CKD (chronic kidney disease) stage 2, GFR 60-89 ml/min 01/05/2016  . Essential hypertension 01/05/2016    Past Medical History:  Past Medical History  Diagnosis Date  . Chronic back pain   . Hypertension    Past Surgical History:  Past Surgical History  Procedure Laterality Date  . Cesarean section    . Maximum access (mas)posterior lumbar interbody fusion (plif) 1 level N/A 01/06/2016    Procedure: FOR MAXIMUM ACCESS (MAS) POSTERIOR LUMBAR INTERBODY FUSION (PLIF) LUMBAR FOUR-FIVE;  Surgeon: Kevan Ny Ditty, MD;  Location: [facility];  Service: Neurosurgery;  Laterality: N/A;    Assessment & Plan Clinical Impression: Patient is a 45 y.o. year old female with recent admission to the hospital on 01/04/16 with 1-2 week history of LLE weakness and numbness. MRI spine showed severe L4/5 facet arthropathy with increasing effusions and mild widening/instability and stable facet synovial cyst. She was evaluated by Dr. Cyndy Freeze who felt that bilateral synovial  cysts were causing severe stenosis and patient underwent L4/5 nerve root decompression with fusion on 01/06/16.    Patient transferred to CIR on 01/12/2016 .    Patient currently requires mod with basic self-care skills secondary to muscle weakness.  Prior to hospitalization, patient could complete ADLs and IADLs with independent .  Patient will benefit from skilled intervention to decrease level of assist with basic self-care skills, increase independence with basic self-care skills and increase level of independence with iADL prior to discharge home with care partner.  Anticipate patient will reach modified independent level for ADLs.  OT - End of Session Activity Tolerance: Tolerates 30+ min activity with multiple rests Endurance Deficit: Yes OT Assessment Rehab Potential (ACUTE ONLY): Excellent Barriers to Discharge: Decreased caregiver support Barriers to Discharge Comments: Mother can only provide supervision not min assist. OT Patient demonstrates impairments in the following area(s): Balance;Endurance;Pain;Safety OT Basic ADL's Functional Problem(s): Grooming;Bathing;Dressing;Toileting OT Advanced ADL's Functional Problem(s): Simple Meal Preparation;Light Housekeeping;Laundry OT Transfers Functional Problem(s): Toilet;Tub/Shower OT Additional Impairment(s): None OT Plan OT Intensity: Minimum of 1-2 x/day, 45 to 90 minutes OT Frequency: 5 out of 7 days OT Duration/Estimated Length of Stay: 7 days OT Treatment/Interventions: Medical illustrator training;Community reintegration;Discharge planning;Functional mobility training;DME/adaptive equipment instruction;Neuromuscular re-education;Self Care/advanced ADL retraining;Patient/family education;Therapeutic Activities;Pain management OT Self Feeding Anticipated Outcome(s): independent OT Basic Self-Care Anticipated Outcome(s): modified independent OT Toileting Anticipated Outcome(s): modified independent OT Bathroom Transfers Anticipated  Outcome(s): modified independent OT Recommendation Patient destination: Home Follow Up Recommendations: Home health OT Equipment Recommended: To be determined  Skilled Therapeutic Intervention Pt began education on selfcare re-training shower level.  She was able to donn her back corsett in sitting with min instructional cueing and supervision.  Min guard assist for sit to stand from various surfaces including the EOB and shower seat.  Noted slight decreased strength in the RLE with pt using more hip circumduction and hiking to advance the foot.  Pt was able to state 3/3 back precautions but needed min instructional cueing to avoid twisting when sitting EOB and getting her clothing out of her bag.  Will need education on AE use for compensation of back precautions.  Discussed expectations for LOS as well as estimated modified independent level goals.  Pt left in bedside recliner with nursing present.   OT Evaluation Precautions/Restrictions  Precautions Precautions: Fall;Back Precaution Comments: able to recall 3/3 precautions Required Braces or Orthoses: Spinal Brace Spinal Brace: Lumbar corset;Applied in sitting position Restrictions Weight Bearing Restrictions: No  Vital Signs Therapy Vitals Pulse Rate: (!) 116 (after ambulating) Patient Position (if appropriate): Sitting Oxygen Therapy SpO2: 100 % O2 Device: Not Delivered Pain Pain Assessment Pain Assessment: 0-10 Pain Score: 6  Pain Type: Surgical pain Pain Location: Back Pain Orientation: Lower;Mid Pain Onset: With Activity Pain Intervention(s): Ambulation/increased activity (premedicated) Home Living/Prior Functioning Home Living Living Arrangements: Spouse/significant other, Children Available Help at Discharge: Family, Available PRN/intermittently Type of Home: House Home Access: Stairs to enter Entrance Stairs-Rails: None Home Layout: One level Bathroom Shower/Tub: Gaffer, Print production planner: Handicapped height  Lives With: Family Prior Function Level of Independence: Independent with basic ADLs, Independent with homemaking with ambulation, Independent with transfers, Independent with gait  Able to Take Stairs?: Yes Driving: Yes Vocation: Full time employment (works at [employer]) [employer]:  (office setting) Leisure: Hobbies-yes (Comment) Comments: spending time with family, cooking, cares for 45 y/o twins ADL  See Function Section of Chart  Vision/Perception  Vision- History Baseline Vision/History: Wears glasses Wears Glasses: At all times Patient Visual Report: No change from baseline Vision- Assessment Vision Assessment?: No apparent visual deficits  Cognition Overall Cognitive Status: Within Functional Limits for tasks assessed Arousal/Alertness: Awake/alert Year: 2017 Month: July Day of Week: Correct Memory: Appears intact Immediate Memory Recall: Sock;Blue;Bed Memory Recall: Sock;Blue;Bed Memory Recall Sock: Without Cue Memory Recall Blue: Without Cue Memory Recall Bed: Without Cue Attention: Focused;Sustained;Selective Focused Attention: Appears intact Sustained Attention: Appears intact Selective Attention: Appears intact Awareness: Appears intact Problem Solving: Appears intact Safety/Judgment: Appears intact Sensation Sensation Light Touch: Appears Intact (In BUEs) Stereognosis: Appears Intact Hot/Cold: Appears Intact Proprioception: Appears Intact Additional Comments: Senation intact in BUEs Coordination Gross Motor Movements are Fluid and Coordinated: Yes Fine Motor Movements are Fluid and Coordinated: Yes Motor  Motor Motor - Skilled Clinical Observations: Pt with increased weakness in the RLE noted with hip compensation during mobility.   Mobility  Bed Mobility Bed Mobility: Supine to Sit Supine to Sit: 5: Supervision;HOB elevated Transfers Transfers: Sit to Stand;Stand to Sit Sit to Stand: 4: Min  assist Stand to Sit: 4: Min assist  Trunk/Postural Assessment  Cervical Assessment Cervical Assessment: Within Functional Limits Thoracic Assessment Thoracic Assessment: Within Functional Limits Lumbar Assessment Lumbar Assessment: Within Functional Limits Postural Control Postural Control: Within Functional Limits  Balance Balance Balance Assessed: Yes Dynamic Sitting Balance Dynamic Sitting - Balance Support: No upper extremity supported Dynamic Sitting - Level of Assistance: 5: Stand by assistance Static Standing Balance Static Standing - Balance Support: Right upper extremity supported;Left upper extremity supported Static Standing -  Level of Assistance: 5: Stand by assistance Dynamic Standing Balance Dynamic Standing - Balance Support: Right upper extremity supported;Left upper extremity supported Dynamic Standing - Level of Assistance: 4: Min assist Extremity/Trunk Assessment RUE Assessment RUE Assessment: Within Functional Limits [facility] for ADL tasks, strength not formally assessed secondary to decreased strength) LUE Assessment LUE Assessment: Within Functional Limits (WFLS for ADL tasks, strength not formally assessed secondary to decreased strength)   See Function Navigator for Current Functional Status.   Refer to Care Plan for Long Term Goals  Recommendations for other services: None  Discharge Criteria: Patient will be discharged from OT if patient refuses treatment 3 consecutive times without medical reason, if treatment goals not met, if there is a change in medical status, if patient makes no progress towards goals or if patient is discharged from hospital.  The above assessment, treatment plan, treatment alternatives and goals were discussed and mutually agreed upon: by patient  Rashika Bettes OTR/L 01/13/2016, 12:38 PM

## 2016-01-13 NOTE — Progress Notes (Signed)
Social Work  Social Work Assessment and Plan  Patient Details  Name: Caitlin Taylor MRN: [id number] Date of Birth: 1971-04-03  Today's Date: 01/13/2016  Problem List:  Patient Active Problem List   Diagnosis Date Noted  . CKD (chronic kidney disease)   . Acute lower UTI   . Leukocytosis   . Lumbar myelopathy ([place]) 01/12/2016  . Low back pain   . Lumbar stenosis   . S/P lumbar fusion   . Post-operative pain   . Acute blood loss anemia   . Constipation due to pain medication   . Morbid obesity due to excess calories ([place])   . Back pain 01/05/2016  . Renal insufficiency 01/05/2016  . Hyperglycemia 01/05/2016  . Lumbar radiculopathy 01/05/2016  . UTI (urinary tract infection) 01/05/2016  . CKD (chronic kidney disease) stage 2, GFR 60-89 ml/min 01/05/2016  . Essential hypertension 01/05/2016   Past Medical History:  Past Medical History  Diagnosis Date  . Chronic back pain   . Hypertension    Past Surgical History:  Past Surgical History  Procedure Laterality Date  . Cesarean section    . Maximum access (mas)posterior lumbar interbody fusion (plif) 1 level N/A 01/06/2016    Procedure: FOR MAXIMUM ACCESS (MAS) POSTERIOR LUMBAR INTERBODY FUSION (PLIF) LUMBAR FOUR-FIVE;  Surgeon: Kevan Ny Ditty, MD;  Location: [facility];  Service: Neurosurgery;  Laterality: N/A;   Social History:  reports that she has never smoked. She does not have any smokeless tobacco history on file. She reports that she does not drink alcohol or use illicit drugs.  Family / Support Systems Marital Status: Married Patient Roles: Spouse, Parent, Other (Comment) (Employee) Spouse/Significant Other: Harrell Gave [phone] Children: 26 yo twins Other Supports: Mom she was staying with prior to admission to assist with her care Anticipated Caregiver: Christopher-husband and Mom  Ability/Limitations of Caregiver: Husband works but Mom can be there but not physically assist Caregiver  Availability: Other (Comment) (Discharge depends upon pt's level at discharge) Family Dynamics: Close knit family who help one another, pt was staying with her Mom prior to admission due to she needing assist. She is home alone now and is doing fine with family checking on her. Pt has good supports via friends, family and colleagues.  Social History Preferred language: English Religion:  Cultural Background: No issues Education: Secretary/administrator educated Read: Yes Write: Yes Employment Status: Employed Name of Employer: Visual merchandiser Return to Work Plans: Would like to return to work when healed Freight forwarder Issues: No issues Guardian/Conservator: None-according to MD pt is capable of making her own decisions while here   Abuse/Neglect Physical Abuse: Denies Verbal Abuse: Denies Sexual Abuse: Denies Exploitation of patient/patient's resources: Denies Self-Neglect: Denies  Emotional Status Pt's affect, behavior adn adjustment status: Pt is motivated and glad to be here on rehab she knows she needs this and will work hard while here. She would like to become independent again and have less pain, that is her goals. She has struggled with this for a while now and is glad the surgery is done and her recovery can begin. Recent Psychosocial Issues: other health issues, has been mainly her back issues Pyschiatric History: No history appears to be coping appropriately with her surgery and is bright and open regarding her concerns. Will monitor while here and have neuro-psych see if needed. Substance Abuse History: No issues  Patient / Family Perceptions, Expectations & Goals Pt/Family understanding of illness & functional limitations: Pt is able to explain  her back surgery and precautions. She talks with the MD daily while rounding and feels she has a good understanding of her treatment plan and progress. Premorbid pt/family roles/activities: Wife, Mother,  employee, caregiver, church member, etc Anticipated changes in roles/activities/participation: resume Pt/family expectations/goals: Pt states: " I want to be able to take care of myself, and not be in excruicating pain doing it."  [employer]: None Premorbid Home Care/DME Agencies: None Transportation available at discharge: Family  Discharge Planning Living Arrangements: Spouse/significant other, Children Support Systems: Spouse/significant other, Children, Armed forces technical officer, Friends/neighbors, Immunologist, Other relatives Type of Residence: Private residence Insurance underwriter Resources: Multimedia programmer (specify) Sports administrator) Financial Resources: Employment, Secondary school teacher Screen Referred: No Living Expenses: Lives with family Money Management: Spouse, Patient Does the patient have any problems obtaining your medications?: No Home Management: patient does the home management but was limited by her back pain Patient/Family Preliminary Plans: Plan is to go to Mom's when first is discharged due to accessibility and Mom needing someone to be there with her. Pt was going back and forth and will probably continue doing this. Hopefully goals will be mod/i by discharge since she does not have 24 hr care. Await team's evaluations Social Work Anticipated Follow Up Needs: HH/OP  Clinical Impression Pleasant female who is ready to work in therapies and recover from her back surgery. She realizes there will be pain but she is ready to work through this. She has supportive husband and Mom who are willing to do what they can to assist her. She plans to go back to Mom's due to Mom needs someone there with her, but can not physically assist pt, just be there. Will await team's evaluations and work on a safe discharge plan. Pt should do well here.  [name] 01/13/2016, 9:57 AM

## 2016-01-13 NOTE — Progress Notes (Signed)
Occupational Therapy Session Note  Patient Details  Name: Caitlin Taylor MRN: [id number] Date of Birth: 1971-06-30  Today's Date: 01/13/2016 OT Individual Time: 1405-1500 OT Individual Time Calculation (min): 55 min    Short Term Goals: Week 1:  OT Short Term Goal 1 (Week 1): STGs equal to LTGs set at modified independent level based on ELOS.  Skilled Therapeutic Interventions/Progress Updates:  Treatment session with focus on education regarding AE to increase independence with LB dressing.  Pt able to recall 3/3 back precautions with no cues.  Educated on use of reacher, wide sock aid, and foot funnel shoe assist.  Pt able to return demonstrate use of each, however continues to have difficulty donning shoes due to edema.  Will require assist or variation of shoe laces to successfully tie shoes after donning.  Ambulated to room toilet and completed toilet transfers with RW and contact guard.  Educated on functional implications of back precautions during toileting and dressing tasks with pt verbalizing understanding.  Left upright in recliner with all needs in reach and RN present.  Therapy Documentation Precautions:  Precautions Precautions: Fall, Back Precaution Comments: able to recall 3/3 precautions Required Braces or Orthoses: Spinal Brace Spinal Brace: Lumbar corset, Applied in sitting position Restrictions Weight Bearing Restrictions: No General:   Vital Signs: Therapy Vitals Temp: 97.7 F (36.5 C) Temp Source: Oral Pulse Rate: (!) 106 Resp: 20 BP: (!) 133/55 mmHg Patient Position (if appropriate): Sitting Oxygen Therapy SpO2: 98 % O2 Device: Not Delivered Pain: Pain Assessment Pain Assessment: 0-10 Pain Score: 6  Pain Location: Back Pain Onset: With Activity Pain Intervention(s): Ambulation/increased activity (premedicated)  See Function Navigator for Current Functional Status.   Therapy/Group: Individual Therapy  Simonne Come 01/13/2016, 3:15 PM

## 2016-01-14 ENCOUNTER — Inpatient Hospital Stay (HOSPITAL_COMMUNITY): Payer: 59 | Admitting: Occupational Therapy

## 2016-01-14 ENCOUNTER — Inpatient Hospital Stay (HOSPITAL_COMMUNITY): Payer: 59

## 2016-01-14 ENCOUNTER — Inpatient Hospital Stay (HOSPITAL_COMMUNITY): Payer: 59 | Admitting: Physical Therapy

## 2016-01-14 DIAGNOSIS — R609 Edema, unspecified: Secondary | ICD-10-CM | POA: Insufficient documentation

## 2016-01-14 MED ORDER — FUROSEMIDE 40 MG PO TABS
40.0000 mg | ORAL_TABLET | Freq: Every evening | ORAL | Status: DC
  Administered 2016-01-14 – 2016-01-17 (×4): 40 mg via ORAL
  Filled 2016-01-14 (×3): qty 1

## 2016-01-14 NOTE — Progress Notes (Signed)
PHYSICAL MEDICINE & REHABILITATION     PROGRESS NOTE  Subjective/Complaints:  Pt sitting at the edge of the bed.  She had a good first day of therapies yesterday, but felt that she could have done a lot better.   ROS: Denies CP, SOB, N/V/D.  Objective: Vital Signs: Blood pressure 148/85, pulse 100, temperature 98 F (36.7 C), temperature source Oral, resp. rate 20, height 5\' 3"  (1.6 m), weight 124 kg (273 lb 5.9 oz), last menstrual period 12/28/2015, SpO2 96 %. No results found.  Recent Labs  01/12/16 1842  WBC 11.8*  HGB 9.7*  HCT 29.7*  PLT 194    Recent Labs  01/11/16 1335 01/12/16 0435 01/12/16 1842  NA 136  --  135  K 3.3*  --  3.5  CL 100*  --  99*  GLUCOSE 141*  --  85  BUN 12  --  15  CREATININE 1.28* 1.20* 1.26*  CALCIUM 8.5*  --  8.7*   CBG (last 3)  No results for input(s): GLUCAP in the last 72 hours.  Wt Readings from Last 3 Encounters:  01/12/16 124 kg (273 lb 5.9 oz)  01/05/16 122.108 kg (269 lb 3.2 oz)    Physical Exam:  BP 148/85 mmHg  Pulse 100  Temp(Src) 98 F (36.7 C) (Oral)  Resp 20  Ht 5\' 3"  (1.6 m)  Wt 124 kg (273 lb 5.9 oz)  BMI 48.44 kg/m2  SpO2 96%  LMP 12/28/2015 Constitutional: She appears well-developed and well-nourished. Obese. NAD  HENT: Normocephalic. Atraumatic.  Eyes: Conjunctivae and EOM are normal.  Cardiovascular: Normal rate and regular rhythm. No murmur heard. Respiratory: Effort normal and breath sounds normal. No stridor. No respiratory distress. She has no wheezes.  GI: Soft. Bowel sounds are normal. She exhibits no distension. There is no tenderness.  Musculoskeletal: She exhibits edema. BLE  Neurological: She is alert and oriented.   Speech clear.  Follows commands without difficulty.  Sensory deficits b/l feet.  Motor: B/l UE 4+/5 proximal to distal LLE: Hip flexion 4+/5, knee extension 4+/5, ankle dorsi/plantar flexion 4+/5 RLE: HF 4/5, KE 4+/5, ADF/PF 4/5.  Skin: Back incision  with dressing c/d/i. Warm and dry. Psychiatric: She has a normal mood and affect. Her behavior is normal. Judgment and thought content normal   Assessment/Plan: 1. Functional deficits secondary to to L4-5 stenosis with myelopathy status post decompression and fusion which require 3+ hours per day of interdisciplinary therapy in a comprehensive inpatient rehab setting. Physiatrist is providing close team supervision and 24 hour management of active medical problems listed below. Physiatrist and rehab team continue to assess barriers to discharge/monitor patient progress toward functional and medical goals.  Function:  Bathing Bathing position   Position: Shower  Bathing parts Body parts bathed by patient: Right arm, Left arm, Chest, Abdomen, Front perineal area, Right upper leg, Left upper leg, Buttocks Body parts bathed by helper: Right lower leg, Left lower leg, Back  Bathing assist        Upper Body Dressing/Undressing Upper body dressing   What is the patient wearing?: Pull over shirt/dress, Bra Bra - Perfomed by patient: Thread/unthread right bra strap, Thread/unthread left bra strap, Hook/unhook bra (pull down sports bra)   Pull over shirt/dress - Perfomed by patient: Thread/unthread right sleeve, Thread/unthread left sleeve, Put head through opening, Pull shirt over trunk          Upper body assist Assist Level: Supervision or verbal cues      Lower  Body Dressing/Undressing Lower body dressing   What is the patient wearing?: Non-skid slipper socks, Pants, Underwear Underwear - Performed by patient: Pull underwear up/down Underwear - Performed by helper: Thread/unthread right underwear leg, Thread/unthread left underwear leg Pants- Performed by patient: Thread/unthread left pants leg, Pull pants up/down Pants- Performed by helper: Thread/unthread right pants leg   Non-skid slipper socks- Performed by helper: Don/doff right sock, Don/doff left sock                   Lower body assist        Toileting Toileting          Toileting assist     Transfers Chair/bed transfer   Chair/bed transfer method: Ambulatory Chair/bed transfer assist level: Touching or steadying assistance (Pt > 75%) Chair/bed transfer assistive device: Medical sales representative     Max distance: 100 ft Assist level: Touching or steadying assistance (Pt > 75%)   Wheelchair   Type: Manual Max wheelchair distance: 30 ft Assist Level: Supervision or verbal cues  Cognition Comprehension Comprehension assist level: Follows complex conversation/direction with no assist  Expression Expression assist level: Expresses complex ideas: With no assist  Social Interaction Social Interaction assist level: Interacts appropriately with others - No medications needed.  Problem Solving Problem solving assist level: Solves complex problems: Recognizes & self-corrects  Memory Memory assist level: Complete Independence: No helper    Medical Problem List and Plan: 1. Lower extremity weakness and sensory deficits secondary to L4-5 stenosis with myelopathy status post decompression and fusion  Cont CIR 2. DVT Prophylaxis/Anticoagulation: Subcutaneous Lovenox. Monitor for any bleeding episodes.  3. Pain Management: Neurontin 300 mg 3 times a day, OxyContin sustained release 20 mg every 12 hours, Robaxin and oxycodone as needed.   Monitor with increased mobility  Will wean as tolerated, plan to d/c oxycontin tomorrow 4. Acute blood loss anemia.   Hb 9.7 on 7/3  Cont to monitor 5. Neuropsych: This patient is capable of making decisions on her own behalf. 6. Skin/Wound Care: Routine skin checks 7. Fluids/Electrolytes/Nutrition: Routine I&O  8.Hypertension. Cont Lisinopril 10 mg daily, Lasix 80 mg daily. 9. Chronic renal insufficiency. Creatinine 1.27-1.42.   Cr. 1.26 on 7/4 10. Morbid obesity. Follow-up dietary 11. Constipation. Laxative assistance 12. UTI  Ucx from 7/2 with  Proteus and E.Coli  Cont keflex started on 7/4 13. Leukocytosis  Likely secondary to UTI  Will cont to monitor 14. LE edema  Will increase lasix to 80mg  daily, 40 every evening  May need Echo as outpt [employer]   01/12/16 1748  Weight: 124 kg (273 lb 5.9 oz)   LOS (Days) 2 A FACE TO FACE EVALUATION WAS PERFORMED  Greyden Besecker Lorie Phenix 01/14/2016 9:16 AM

## 2016-01-14 NOTE — Progress Notes (Signed)
Physical Therapy Session Note  Patient Details  Name: Caitlin Taylor MRN: [id number] Date of Birth: [birth year]  Today's Date: 01/14/2016 PT Individual Time: 1305-1402 PT Individual Time Calculation (min): 57 min   Short Term Goals: Week 1:  PT Short Term Goal 1 (Week 1): STG = LTG Due to short ELOS.  Skilled Therapeutic Interventions/Progress Updates:    Gait training for 40ft x 2 and 151ft with RW. SPO298%, HR 125 bpm following gait of 33ft and HR of 135bpm following gait of 170ft. PT provided close supervision A with min cues for improved step length, increased step width, improved pelvic stability, and proper AD management with turns. Patient unable to improved pelvic stability but was noted to have increased step length following cues from PT.    Nustep training for 12 min, 3 min warm up on level 2, 6 min on level 5, and 3 min on level 2 for cool down. Throughout Nustep training Pt provided min-mod cues to maintain SPM >45. Patient reports Borg RPE at 13 on 6 minutes and 15 at 9 minutes. PT provided patient with prolonged rest break following nustep training due to increased fatigue. No complaints of SOB following nustpe.   Sit<>stand and stand pivot Transfers completed with supervision A from PT and RW x 8 throughout treatment.   Patient instructed in [facility] mobility for 160 ft to room with 1 short rest break and supervision A from PT. Mod cues for improved participation and doorway management provided by PT.   Patient left sitting in [facility] with call bell within reach at end of session.      Therapy Documentation Precautions:  Precautions Precautions: Fall, Back Precaution Comments: able to recall 3/3 precautions Required Braces or Orthoses: Spinal Brace Spinal Brace: Lumbar corset, Applied in sitting position Restrictions Weight Bearing Restrictions: No    Vital Signs: Therapy Vitals Temp: 98.4 F (36.9 C) Temp Source: Oral Pulse Rate: 86 Resp: 18 BP: (!) 156/78  mmHg Patient Position (if appropriate): Lying Oxygen Therapy SpO2: 98 % O2 Device: Not Delivered Pain: Pain Assessment Pain Assessment: No/denies pain   See Function Navigator for Current Functional Status.   Therapy/Group: Individual Therapy  Lorie Phenix 01/14/2016, 6:12 PM

## 2016-01-14 NOTE — Progress Notes (Signed)
Physical Therapy Session Note  Patient Details  Name: Caitlin Taylor MRN: [id number] Date of Birth: Mar 30, 1971  Today's Date: 01/14/2016 PT Individual Time: 1633-1700 PT Individual Time Calculation (min): 27 min   Short Term Goals: Week 1:  PT Short Term Goal 1 (Week 1): STG = LTG Due to short ELOS.  Skilled Therapeutic Interventions/Progress Updates:    Pt received in bed, denying c/o pain, and agreeable to PT. Pt transferred supine>sit with use of bed rails & HOB elevated, with PT cuing pt for technique to maintain back precautions. Instructed pt on proper donning of back brace & pt able to complete with min A. Gait training x 125 ft with RW & supervision; pt with continued R knee buckling. During gait pt required multiple standing breaks 2/2 SOB; after task HR = 108 bpm & SpO2 = 100%. Stair training completed in gym with B rails x 4 steps (6") for 2 trials. Educated pt on compensatory pattern for stair negotiation & pt able to complete task with Min A. Pt with poor eccentric control LLE when descending stairs with pt instructed to complete task slowly for increased control. After stairs HR = 119 bpm, SpO2 = 98%. During session educated pt on technique & benefits of pursed lip breathing with pt able to return demonstrate. At end of session pt left in w/c in room with all needs within reach & set up with dinner tray.  Therapy Documentation Precautions:  Precautions Precautions: Fall, Back Precaution Comments: able to recall 3/3 precautions Required Braces or Orthoses: Spinal Brace Spinal Brace: Lumbar corset, Applied in sitting position Restrictions Weight Bearing Restrictions: No  Pain: Pain Assessment Pain Assessment: No/denies pain    See Function Navigator for Current Functional Status.   Therapy/Group: Individual Therapy  Waunita Schooner 01/14/2016, 12:23 PM

## 2016-01-14 NOTE — Patient Care Conference (Signed)
Inpatient RehabilitationTeam Conference and Plan of Care Update Date: 01/14/2016   Time: 11:15 AM    Patient Name: Caitlin Taylor      Medical Record Number: [id number]  Date of Birth: April 15, 1971 Sex: Female         Room/Bed: 4M11C/4M11C-01 Payor Info: Payor: Theme park manager / Plan: [place] / Product Type: *No Product type* /    Admitting Diagnosis: [place]-lumber-deccmp-Fusion  Admit Date/Time:  01/12/2016  5:44 PM Admission Comments: No comment available   Primary Diagnosis:  <principal problem not specified> Principal Problem: <principal problem not specified>  Patient Active Problem List   Diagnosis Date Noted  . Swelling   . CKD (chronic kidney disease)   . Acute lower UTI   . Leukocytosis   . Lumbar myelopathy ([place]) 01/12/2016  . Low back pain   . Lumbar stenosis   . S/P lumbar fusion   . Post-operative pain   . Acute blood loss anemia   . Constipation due to pain medication   . Morbid obesity due to excess calories ([place])   . Back pain 01/05/2016  . Renal insufficiency 01/05/2016  . Hyperglycemia 01/05/2016  . Lumbar radiculopathy 01/05/2016  . UTI (urinary tract infection) 01/05/2016  . CKD (chronic kidney disease) stage 2, GFR 60-89 ml/min 01/05/2016  . Essential hypertension 01/05/2016    Expected Discharge Date: Expected Discharge Date: 01/21/16  Team Members Present: Physician leading conference: Dr. Delice Lesch Social Worker Present: Ovidio Kin, LCSW Nurse Present: Dorien Chihuahua, RN PT Present: Barrie Folk, PT;Victoria Sabra Heck, PT OT Present: Willeen Cass, OT SLP Present: Gunnar Fusi, SLP PPS Coordinator present : Daiva Nakayama, RN, CRRN     Current Status/Progress Goal Weekly Team Focus  Medical   Lower extremity weakness and sensory deficits secondary to L4-5 stenosis with myelopathy status post decompression and fusion  Improve edema, pain, mobility, transfers, safety  See above   Bowel/Bladder   LBM 01/12/16 cont B/B   continence  continue to monitor   Swallow/Nutrition/ Hydration             ADL's   min assist for shower transfers and sit to stand for LB selfcare.  Mod assist for LB bathing and dressing without AE, should do well once AE is introduced.  Supervision for UB bathing and dressing  modified independent level for selfcare and transfers  selfcare re-training, transfer training, balance re-training, DME/AE education, functional mobility, education on precautions,   Mobility   Min A ambulation for 147ft with RW, 30 ft w/c mobility with supervision, min A for transfers (sit<>Stand & car), negotiates 12 steps with B rails & Min A, limited by SOB with activity, RLE weak  Mod I for all ambulation, transfers, & standing balance  pt education, strengthening, endurance training, balance training, gait trianing   Communication             Safety/Cognition/ Behavioral Observations  min assist  supervision  cont. to monitor   Pain   no complaints of pain  pain less than 2  cont. to monitor   Skin   no skin breakdown  no new skin breakdown  cont. to monitor q shift      *See Care Plan and progress notes for long and short-term goals.  Barriers to Discharge: UTI, ABLA, pain, edema    Possible Resolutions to Barriers:  Optimize diuretics, likely Echo as outpt, Cont abx, follow labs, wean pain meds    Discharge Planning/Teaching Needs:  HOme to MOm's to be there  with her and go back and forth between Mom's and her home. Needs to be mod/i level      Team Discussion:  Goals mod/i level, currently min/mod level. MD treating UTI and checking her LE for DVT's. Adjusting medications for edema in legs. Pain is better decreasing pain medications. SOB since surgery working on Adult nurse. RN-watching drainage from back incision  Revisions to Treatment Plan:  NOne   Continued Need for Acute Rehabilitation Level of Care: The patient requires daily medical management by a physician with specialized  training in physical medicine and rehabilitation for the following conditions: Daily direction of a multidisciplinary physical rehabilitation program to ensure safe treatment while eliciting the highest outcome that is of practical value to the patient.: Yes Daily medical management of patient stability for increased activity during participation in an intensive rehabilitation regime.: Yes Daily analysis of laboratory values and/or radiology reports with any subsequent need for medication adjustment of medical intervention for : Post surgical problems;Neurological problems;Wound care problems;Urological problems  [name] 01/14/2016, 12:58 PM

## 2016-01-14 NOTE — Progress Notes (Signed)
Social Work Patient ID: Caitlin Taylor, female   DOB: [birth year], 45 y.o.   MRN: [id number] Met with pt to discuss team conference goals mod/i level and target discharge 7/12. She had dopplers don today to check LE, came back clear. She is doing much better and her pain is being managed. Plan to go to Mom's home at discharge. Will continue to work on discharge needs.

## 2016-01-14 NOTE — Progress Notes (Signed)
Occupational Therapy Session Note  Patient Details  Name: Caitlin Taylor MRN: [id number] Date of Birth: 1970-10-02  Today's Date: 01/14/2016 OT Individual Time: [id number] OT Individual Time Calculation (min): 54 min    Short Term Goals: Week 1:  OT Short Term Goal 1 (Week 1): STGs equal to LTGs set at modified independent level based on ELOS.  Skilled Therapeutic Interventions/Progress Updates:    Pt completed supine to sit EOB with supervision and min instructional cueing for technique to follow back precautions.  She was able to donn back corset with supervision EOB.  She ambulated to the bathroom and completed toileting with supervision as well.  Discussed possible need for AE for completion of toilet hygiene if she has a BM.  Will continue to look at.  She was able to transfer back out to the wheelchair at the sink for grooming tasks and dressing.  Overall supervision with use of AE to donn underpants and pants this session.  Needs further work on problem solving donning shoes.  Will address next session.   Therapy Documentation Precautions:  Precautions Precautions: Fall, Back Precaution Comments: able to recall 3/3 precautions Required Braces or Orthoses: Spinal Brace Spinal Brace: Lumbar corset, Applied in sitting position Restrictions Weight Bearing Restrictions: No  Pain: Pain Assessment Pain Assessment: Faces Pain Score: 4  Faces Pain Scale: Hurts a little bit Pain Type: Surgical pain Pain Location: Back Pain Orientation: Lower Pain Intervention(s): Repositioned;Medication (See eMAR) ADL: See Function Navigator for Current Functional Status.   Therapy/Group: Individual Therapy  Ayisha Pol OTR/L 01/14/2016, 12:53 PM

## 2016-01-14 NOTE — Progress Notes (Signed)
VASCULAR LAB PRELIMINARY  PRELIMINARY  PRELIMINARY  PRELIMINARY  Bilateral lower extremity venous duplex completed.    Preliminary report:  Bilateral:  No evidence of DVT in the veins imaged. Bilateral - unable to visualize peroneals due to body habitus and edema. No evidence of superficial thrombosis or Baker's Cyst. No change from previous study.   Cintia Gleed, RVS 01/14/2016, 11:15 AM

## 2016-01-14 NOTE — Progress Notes (Signed)
Occupational Therapy Session Note  Patient Details  Name: Caitlin Taylor MRN: [id number] Date of Birth: 09/17/1970  Today's Date: 01/14/2016 OT Individual Time: 1400-1500 OT Individual Time Calculation (min): 60 min    Short Term Goals: Week 1:  OT Short Term Goal 1 (Week 1): STGs equal to LTGs set at modified independent level based on ELOS.  Skilled Therapeutic Interventions/Progress Updates:  Upon entering the room, pt seated in wheelchair with 5/10 c/o pain in lower back. Pt received medications prior to OT arrival. OT propelled pt wheelchair to ADL apartment for time management. OT educated pt on various ways to perform functional bed making tasks while maintaining back precautions. Pt demonstrated ability to perform tasks with min A for ambulating squatting in order to reach items as needed. Pt sitting on bed at times to fix materials as well. OT educated pt on safety with mobility and obtaining items in kitchen from wheelchair level and utilizing chair to conserve energy and safety. Pt ambulated with RW and able to obtain items from refrigerator and transfer to various areas using counters. Pt seated secondary to fatigue. Pt then ambulating 80' back to room with min A for safety and use of RW. Pt removing brace seated on EOB. Supine >sit with verbal cues for technique and B LE's elevated. Call bell and all needed items within reach upon exiting the room.   Therapy Documentation Precautions:  Precautions Precautions: Fall, Back Precaution Comments: able to recall 3/3 precautions Required Braces or Orthoses: Spinal Brace Spinal Brace: Lumbar corset, Applied in sitting position Restrictions Weight Bearing Restrictions: No  See Function Navigator for Current Functional Status.   Therapy/Group: Individual Therapy  Caitlin Taylor 01/14/2016, 5:07 PM

## 2016-01-15 ENCOUNTER — Inpatient Hospital Stay (HOSPITAL_COMMUNITY): Payer: 59 | Admitting: Occupational Therapy

## 2016-01-15 ENCOUNTER — Inpatient Hospital Stay (HOSPITAL_COMMUNITY): Payer: 59 | Admitting: Physical Therapy

## 2016-01-15 MED ORDER — OXYCODONE HCL 5 MG PO TABS
5.0000 mg | ORAL_TABLET | Freq: Four times a day (QID) | ORAL | Status: DC | PRN
  Administered 2016-01-16 – 2016-01-21 (×4): 10 mg via ORAL
  Filled 2016-01-15 (×4): qty 2

## 2016-01-15 MED ORDER — TRAMADOL HCL 50 MG PO TABS
50.0000 mg | ORAL_TABLET | Freq: Four times a day (QID) | ORAL | Status: DC | PRN
Start: 2016-01-15 — End: 2016-01-21
  Administered 2016-01-16 – 2016-01-20 (×2): 50 mg via ORAL
  Filled 2016-01-15 (×2): qty 1

## 2016-01-15 NOTE — Progress Notes (Signed)
[facility] PHYSICAL MEDICINE & REHABILITATION     PROGRESS NOTE  Subjective/Complaints:  Pt sitting at the edge of the bed.  She still has edema.  She requests for her IV to be d/ced.    ROS: Denies CP, SOB, N/V/D.  Objective: Vital Signs: Blood pressure 136/68, pulse 89, temperature 98.4 F (36.9 C), temperature source Oral, resp. rate 18, height 5\' 3"  (1.6 m), weight 124.2 kg (273 lb 13 oz), last menstrual period 12/28/2015, SpO2 96 %. No results found.  Recent Labs  01/12/16 1842  WBC 11.8*  HGB 9.7*  HCT 29.7*  PLT 194    Recent Labs  01/12/16 1842  NA 135  K 3.5  CL 99*  GLUCOSE 85  BUN 15  CREATININE 1.26*  CALCIUM 8.7*   CBG (last 3)  No results for input(s): GLUCAP in the last 72 hours.  Wt Readings from Last 3 Encounters:  01/15/16 124.2 kg (273 lb 13 oz)  01/05/16 122.108 kg (269 lb 3.2 oz)    Physical Exam:  BP 136/68 mmHg  Pulse 89  Temp(Src) 98.4 F (36.9 C) (Oral)  Resp 18  Ht 5\' 3"  (1.6 m)  Wt 124.2 kg (273 lb 13 oz)  BMI 48.52 kg/m2  SpO2 96%  LMP 12/28/2015 Constitutional: She appears well-developed and well-nourished. Obese. NAD  HENT: Normocephalic. Atraumatic.  Eyes: Conjunctivae and EOM are normal.  Cardiovascular: Normal rate and regular rhythm. No murmur heard. Respiratory: Effort normal and breath sounds normal. No stridor. No respiratory distress. She has no wheezes.  GI: Soft. Bowel sounds are normal. She exhibits no distension. There is no tenderness.  Musculoskeletal: She exhibits edema. BLE  Neurological: She is alert and oriented.   Speech clear.  Follows commands without difficulty.  Sensory deficits b/l feet.  Motor: B/l UE 4+/5 proximal to distal LLE: Hip flexion 4+/5, knee extension 4+/5, ankle dorsi/plantar flexion 4+/5 RLE: HF 4/5, KE 4+/5, ADF/PF 4/5.  Skin: Back incision with dressing c/i, minimal dry serous drainage on dressing. Warm and dry. Psychiatric: Very pleasant. She has a normal mood and  affect. Her behavior is normal. Judgment and thought content normal   Assessment/Plan: 1. Functional deficits secondary to to L4-5 stenosis with myelopathy status post decompression and fusion which require 3+ hours per day of interdisciplinary therapy in a comprehensive inpatient rehab setting. Physiatrist is providing close team supervision and 24 hour management of active medical problems listed below. Physiatrist and rehab team continue to assess barriers to discharge/monitor patient progress toward functional and medical goals.  Function:  Bathing Bathing position   Position: Shower  Bathing parts Body parts bathed by patient: Right arm, Left arm, Front perineal area, Buttocks Body parts bathed by helper: Right lower leg, Left lower leg, Back  Bathing assist        Upper Body Dressing/Undressing Upper body dressing   What is the patient wearing?: Pull over shirt/dress, Bra Bra - Perfomed by patient: Thread/unthread right bra strap, Thread/unthread left bra strap, Hook/unhook bra (pull down sports bra)   Pull over shirt/dress - Perfomed by patient: Thread/unthread right sleeve, Thread/unthread left sleeve, Put head through opening, Pull shirt over trunk          Upper body assist Assist Level: Supervision or verbal cues      Lower Body Dressing/Undressing Lower body dressing   What is the patient wearing?: Pants, Underwear Underwear - Performed by patient: Thread/unthread right underwear leg, Thread/unthread left underwear leg, Pull underwear up/down Underwear - Performed by helper:  Thread/unthread right underwear leg, Thread/unthread left underwear leg Pants- Performed by patient: Thread/unthread right pants leg, Thread/unthread left pants leg, Pull pants up/down Pants- Performed by helper: Thread/unthread right pants leg   Non-skid slipper socks- Performed by helper: Don/doff right sock, Don/doff left sock                  Lower body assist         Toileting Toileting   Toileting steps completed by patient: Adjust clothing prior to toileting, Performs perineal hygiene, Adjust clothing after toileting   Toileting Assistive Devices: Grab bar or rail  Toileting assist Assist level: Touching or steadying assistance (Pt.75%)   Transfers Chair/bed transfer   Chair/bed transfer method: Stand pivot Chair/bed transfer assist level: Supervision or verbal cues Chair/bed transfer assistive device: Armrests, Medical sales representative     Max distance: 115 Assist level: Supervision or verbal cues   Wheelchair   Type: Manual Max wheelchair distance: 160 Assist Level: Supervision or verbal cues  Cognition Comprehension Comprehension assist level: Follows complex conversation/direction with no assist  Expression Expression assist level: Expresses complex ideas: With no assist  Social Interaction Social Interaction assist level: Interacts appropriately with others - No medications needed.  Problem Solving Problem solving assist level: Solves complex problems: Recognizes & self-corrects  Memory Memory assist level: Complete Independence: No helper    Medical Problem List and Plan: 1. Lower extremity weakness and sensory deficits secondary to L4-5 stenosis with myelopathy status post decompression and fusion  Cont CIR 2. DVT Prophylaxis/Anticoagulation: Subcutaneous Lovenox. Monitor for any bleeding episodes. SCDs 3. Pain Management:   Neurontin 300 mg 3 times a day  OxyContin sustained release 20 mg every 12 hours d/ced on 7/6  Robaxin, Ultram, and oxycodone as needed.   Monitor with increased mobility  Will wean as tolerated, plan to d/c oxycontin tomorrow 4. Acute blood loss anemia.   Hb 9.7 on 7/3  Cont to monitor 5. Neuropsych: This patient is capable of making decisions on her own behalf. 6. Skin/Wound Care: Routine skin checks 7. Fluids/Electrolytes/Nutrition: Routine I&O  8.Hypertension. Cont Lisinopril 10 mg  daily, Lasix 80 mg daily. 9. Chronic renal insufficiency. Creatinine 1.27-1.42.   Cr. 1.26 on 7/4  Labs ordered for tomorrow 10. Morbid obesity. Follow-up dietary 11. Constipation. Laxative assistance 12. UTI  Ucx from 7/2 with Proteus and E.Coli  Cont keflex started on 7/4 13. Leukocytosis  Likely secondary to UTI  Will cont to monitor  Labs ordered for tomorrow 14. LE edema  Increased lasix to 80mg  daily, 40 every evening  May need Echo as outpt Filed Weights   01/12/16 1748 01/14/16 1630 01/15/16 0522  Weight: 124 kg (273 lb 5.9 oz) 124.6 kg (274 lb 11.1 oz) 124.2 kg (273 lb 13 oz)   LOS (Days) 3 A FACE TO FACE EVALUATION WAS PERFORMED  Ankit Lorie Phenix 01/15/2016 7:57 AM

## 2016-01-15 NOTE — Progress Notes (Signed)
Occupational Therapy Session Note  Patient Details  Name: Caitlin Taylor MRN: [id number] Date of Birth: 07/19/1970  Today's Date: 01/15/2016 OT Individual Time: 1100-1200 [id number] OT Individual Time Calculation (min): 60 min and 55 min    Short Term Goals: Week 1:  OT Short Term Goal 1 (Week 1): STGs equal to LTGs set at modified independent level based on ELOS.  Skilled Therapeutic Interventions/Progress Updates:     Session 1: Upon entering the room, pt seated in wheelchair with 5/10 c/o pain. Pt continues to have pitting edema in B LE's. Pt transferred onto bed with min A and mod A to sit >supine. OT wrapping B LE's from foot to knee with ACE wrap in order to decrease swelling. Pt reports no discomfort with wrapping at end of session. Pt transferred back into wheelchair with RW and min A . Pt propelled wheelchair with supervision and increased time 75'. OT propelling pt the rest of the way to tub shower room. OT educated and demonstrates to pt how to preform a transfer onto TTB with RW. Pt returned demonstration with steady assist. Pt able to get B LE's into tub with steady assist. Ot recommended purchase of safety treads. Pt transferred from TTB back to wheelchair with min A. OT propelled pt back to room for time management. Pt ambulated short distance of 10' into bathroom for toileting. Pt seated on elevated toilet with steady assist. Pt needing steady assist and increased time for clothing management and hygiene. Pt returned to sit in wheelchair with call bell and all needed items within reach.   Session 2: Upon entering the room, Pt seated in wheelchair with 8/10 c/o pain in lower back but still agreeable to OT intervention. Pt propelled into hallway for game of horseshoes. Pt standing within RW and tossing horseshoes while bending at the knees and maintaining back precautions. Pt ambulating and bending at knees while utilizing reacher to pick up horseshoes from the ground. Pt reports  knees feeling very weak and requests to return to wheelchair. Pt performs task again but from wheelchair level. Pt maintaining back precautions this session. Pt returning to bed at end of session with min A stand pivot transfer with RW. Min A supine >sit for R LE. Call bell and all needed items within reach upon exiting the room.   Therapy Documentation Precautions:  Precautions Precautions: Fall, Back Precaution Comments: able to recall 3/3 precautions Required Braces or Orthoses: Spinal Brace Spinal Brace: Lumbar corset, Applied in sitting position Restrictions Weight Bearing Restrictions: No General:   Vital Signs: Therapy Vitals Temp: 98.6 F (37 C) Temp Source: Oral Pulse Rate: (!) 106 Resp: 16 BP: 132/68 mmHg Patient Position (if appropriate): Sitting Oxygen Therapy SpO2: 98 % O2 Device: Not Delivered  See Function Navigator for Current Functional Status.   Therapy/Group: Individual Therapy  Phineas Semen 01/15/2016, 2:30 PM

## 2016-01-15 NOTE — Progress Notes (Signed)
Physical Therapy Session Note  Patient Details  Name: Caitlin Taylor MRN: [id number] Date of Birth: 1970/09/06  Today's Date: 01/15/2016 PT Individual Time: 0915-1029 PT Individual Time Calculation (min): 74 min   Short Term Goals: Week 1:  PT Short Term Goal 1 (Week 1): STG = LTG Due to short ELOS.  Skilled Therapeutic Interventions/Progress Updates:    Patient received sitting in [facility] and agreeable to PT.  Gait training performed in hall for 90 ft and 144ft with supervision A From PT and min cues for improved step length and increased pelciv control. Gait training also performed in rehab gym for 13ft x 3. Dynamic gait training to weave through 6 cones x 2 with RW and supervision A. Min cues provided for AD management to maintain COM within RW.   Patient instructed by PT in [name] level A exercises including LAQ, HS curls, Hip abduction, minisquats, 6 inch stairs x 4 with BUE support, and tandem stance 15 seconds x 2 BLE. All therex performed x 10 with min A from PT and mod cues for technique including improved ROM, increased control with eccentric movement and improved pelvic stability. PT provided min cues for improved step to gait pattern.   Patient perfirmed 5xSTS with BUE support: 24 seconds.   All sit<>stand transfers performed with RW and supervision A from PT with min cues for safety and increased use of UE to control descent into chair.    Patient returned to room and left sitting in [facility] with call bell within reach.     Therapy Documentation Precautions:  Precautions Precautions: Fall, Back Precaution Comments: able to recall 3/3 precautions Required Braces or Orthoses: Spinal Brace Spinal Brace: Lumbar corset, Applied in sitting position Restrictions Weight Bearing Restrictions: No  See Function Navigator for Current Functional Status.   Therapy/Group: Individual Therapy  Lorie Phenix 01/15/2016, 10:31 AM

## 2016-01-16 ENCOUNTER — Inpatient Hospital Stay (HOSPITAL_COMMUNITY): Payer: 59 | Admitting: Occupational Therapy

## 2016-01-16 ENCOUNTER — Inpatient Hospital Stay (HOSPITAL_COMMUNITY): Payer: 59 | Admitting: Physical Therapy

## 2016-01-16 DIAGNOSIS — E876 Hypokalemia: Secondary | ICD-10-CM | POA: Insufficient documentation

## 2016-01-16 DIAGNOSIS — L299 Pruritus, unspecified: Secondary | ICD-10-CM | POA: Insufficient documentation

## 2016-01-16 LAB — BASIC METABOLIC PANEL
Anion gap: 9 (ref 5–15)
BUN: 12 mg/dL (ref 6–20)
CHLORIDE: 99 mmol/L — AB (ref 101–111)
CO2: 31 mmol/L (ref 22–32)
CREATININE: 1.05 mg/dL — AB (ref 0.44–1.00)
Calcium: 8.8 mg/dL — ABNORMAL LOW (ref 8.9–10.3)
GFR calc Af Amer: >60 mL/min (ref >60)
GFR calc non Af Amer: >60 mL/min (ref >60)
Glucose, Bld: 102 mg/dL — ABNORMAL HIGH (ref 65–99)
Potassium: 3.3 mmol/L — ABNORMAL LOW (ref 3.5–5.1)
SODIUM: 139 mmol/L (ref 135–145)

## 2016-01-16 LAB — CBC WITH DIFFERENTIAL/PLATELET
Basophils Absolute: 0 10*3/uL (ref 0.0–0.1)
Basophils Relative: 0 %
EOS ABS: 0.1 10*3/uL (ref 0.0–0.7)
EOS PCT: 1 %
HCT: 27.2 % — ABNORMAL LOW (ref 36.0–46.0)
Hemoglobin: 9 g/dL — ABNORMAL LOW (ref 12.0–15.0)
LYMPHS ABS: 0.9 10*3/uL (ref 0.7–4.0)
Lymphocytes Relative: 13 %
MCH: 30.7 pg (ref 26.0–34.0)
MCHC: 33.1 g/dL (ref 30.0–36.0)
MCV: 92.8 fL (ref 78.0–100.0)
MONO ABS: 0.4 10*3/uL (ref 0.1–1.0)
MONOS PCT: 6 %
Neutro Abs: 5.6 10*3/uL (ref 1.7–7.7)
Neutrophils Relative %: 80 %
PLATELETS: 197 10*3/uL (ref 150–400)
RBC: 2.93 MIL/uL — ABNORMAL LOW (ref 3.87–5.11)
RDW: 14.9 % (ref 11.5–15.5)
WBC: 7 10*3/uL (ref 4.0–10.5)

## 2016-01-16 MED ORDER — METHOCARBAMOL 750 MG PO TABS
750.0000 mg | ORAL_TABLET | Freq: Four times a day (QID) | ORAL | Status: DC | PRN
  Administered 2016-01-17 – 2016-01-19 (×4): 750 mg via ORAL
  Filled 2016-01-16 (×4): qty 1

## 2016-01-16 MED ORDER — FERROUS SULFATE 325 (65 FE) MG PO TABS
325.0000 mg | ORAL_TABLET | Freq: Every day | ORAL | Status: DC
  Administered 2016-01-17 – 2016-01-21 (×5): 325 mg via ORAL
  Filled 2016-01-16 (×5): qty 1

## 2016-01-16 MED ORDER — POTASSIUM CHLORIDE CRYS ER 20 MEQ PO TBCR
20.0000 meq | EXTENDED_RELEASE_TABLET | Freq: Two times a day (BID) | ORAL | Status: AC
  Administered 2016-01-16 – 2016-01-17 (×4): 20 meq via ORAL
  Filled 2016-01-16 (×4): qty 1

## 2016-01-16 MED ORDER — LORATADINE 10 MG PO TABS: 10.0000 mg | ORAL_TABLET | Freq: Every day | ORAL | Status: DC | PRN

## 2016-01-16 MED ORDER — VITAMIN C 500 MG PO TABS
250.0000 mg | ORAL_TABLET | Freq: Every day | ORAL | Status: DC
Start: 2016-01-17 — End: 2016-01-21
  Administered 2016-01-17 – 2016-01-21 (×5): 250 mg via ORAL
  Filled 2016-01-16 (×5): qty 1

## 2016-01-16 MED ORDER — CEPHALEXIN 250 MG PO CAPS
500.0000 mg | ORAL_CAPSULE | Freq: Two times a day (BID) | ORAL | Status: AC
Start: 2016-01-16 — End: 2016-01-19
  Administered 2016-01-16 – 2016-01-19 (×8): 500 mg via ORAL
  Filled 2016-01-16 (×8): qty 2

## 2016-01-16 NOTE — Progress Notes (Signed)
Occupational Therapy Session Note  Patient Details  Name: Caitlin Taylor MRN: [id number] Date of Birth: [birth year]  Today's Date: 01/16/2016 OT Individual Time: [id number] OT Individual Time Calculation (min): 63 min    Short Term Goals: Week 1:  OT Short Term Goal 1 (Week 1): STGs equal to LTGs set at modified independent level based on ELOS.  Skilled Therapeutic Interventions/Progress Updates:    Pt greeted in [facility] in room with no c/o pain at the time and reports of having completed self bathing in previous session. Pt self-propelled to therapy gym in [facility] with x4 rest breaks due to fatigue.   Treatment session with focus on functional ambulation, use of AD, and attention to back precautions in context of simulated occupational tasks. Pt used RW with S for all activities with verbal cues provided for safe placement of reacher on RW frame. Discussed use of RW bag for transporting items safely while safeguarding full manual grasp on RW during ambulation. Pt used RW to ambulate to x1 pillowcase and x4 cloths placed flat on floor for graded difficulty of challenge when using reacher to retrieve items. Increased challenge by having pt retrieve weighted items and smaller items from floor and various surface heights to more closely resemble light housekeeping and other activities performed at home and elsewhere both with and without use of reacher. Facilitated NMR in context of activity by having pt return items to second party in therapy gym in D1 and D2 flexion/extension patterns while maintaining back precautions. Provided pt education on means to work around twisting precautions by bending and twisting at knees while maintaining alignment of shoulders and hips. Pt demonstrated understanding.   Pt ambulated 75+ft back to room using RW under S and clinician propelling pt in [facility] the remaining distance due to lack of time. Pt left on toilet in bathroom with nursing assistant notified.   Therapy  Documentation Precautions:  Precautions Precautions: Fall, Back Precaution Comments: able to recall 3/3 precautions Required Braces or Orthoses: Spinal Brace Spinal Brace: Lumbar corset, Applied in sitting position Restrictions Weight Bearing Restrictions: No General:   Vital Signs: Therapy Vitals BP: 130/67 mmHg Pain:   Pt rated back pain at 4/10 and 6/10 with activity. Seated rest breaks provided to alleviate pain onset and symptoms.   See Function Navigator for Current Functional Status.   Therapy/Group: Individual Therapy  Dierdre Searles 01/16/2016, 12:10 PM

## 2016-01-16 NOTE — Progress Notes (Signed)
[place] PHYSICAL MEDICINE & REHABILITATION     PROGRESS NOTE  Subjective/Complaints:  Pt sitting up in her chair.  She notes itching along her back.  She complains of persistent swelling LE.    ROS: +Pruritis on back. Denies CP, SOB, N/V/D.  Objective: Vital Signs: Blood pressure 130/67, pulse 90, temperature 98.4 F (36.9 C), temperature source Oral, resp. rate 18, height 5\' 3"  (1.6 m), weight 124.2 kg (273 lb 13 oz), last menstrual period 12/28/2015, SpO2 97 %. No results found.  Recent Labs  01/16/16 0610  WBC 7.0  HGB 9.0*  HCT 27.2*  PLT 197    Recent Labs  01/16/16 0610  NA 139  K 3.3*  CL 99*  GLUCOSE 102*  BUN 12  CREATININE 1.05*  CALCIUM 8.8*   CBG (last 3)  No results for input(s): GLUCAP in the last 72 hours.  Wt Readings from Last 3 Encounters:  01/16/16 124.2 kg (273 lb 13 oz)  01/05/16 122.108 kg (269 lb 3.2 oz)    Physical Exam:  BP 130/67 mmHg  Pulse 90  Temp(Src) 98.4 F (36.9 C) (Oral)  Resp 18  Ht 5\' 3"  (1.6 m)  Wt 124.2 kg (273 lb 13 oz)  BMI 48.52 kg/m2  SpO2 97%  LMP 12/28/2015 Constitutional: She appears well-developed. Obese. NAD  HENT: Normocephalic. Atraumatic.  Eyes: Conjunctivae and EOM are normal.  Cardiovascular: Normal rate and regular rhythm.No murmur heard. Respiratory: Effort normal and breath sounds normal. No stridor. No respiratory distress. She has no wheezes.  GI: Soft. Bowel sounds are normal. She exhibits no distension. There is no tenderness.  Musculoskeletal: She exhibits edema. BLE  Neurological: She is alert and oriented.   Speech clear.  Follows commands without difficulty.  Sensory deficits b/l feet.  Motor: B/l UE 4+/5 proximal to distal LLE: Hip flexion 4+/5, knee extension 4+/5, ankle dorsi/plantar flexion 4+/5 RLE: HF 4/5, KE 4+/5, ADF/PF 4/5.  Skin: Back incision with dressing c/d/i. No erythema.  Warm and dry. Psychiatric: Very pleasant. She has a normal mood and affect. Her behavior  is normal. Judgment and thought content normal   Assessment/Plan: 1. Functional deficits secondary to to L4-5 stenosis with myelopathy status post decompression and fusion which require 3+ hours per day of interdisciplinary therapy in a comprehensive inpatient rehab setting. Physiatrist is providing close team supervision and 24 hour management of active medical problems listed below. Physiatrist and rehab team continue to assess barriers to discharge/monitor patient progress toward functional and medical goals.  Function:  Bathing Bathing position   Position: Shower  Bathing parts Body parts bathed by patient: Right arm, Left arm, Front perineal area, Buttocks Body parts bathed by helper: Right lower leg, Left lower leg, Back  Bathing assist Assist Level: Touching or steadying assistance(Pt > 75%)      Upper Body Dressing/Undressing Upper body dressing   What is the patient wearing?: Pull over shirt/dress, Bra Bra - Perfomed by patient: Thread/unthread right bra strap, Thread/unthread left bra strap, Hook/unhook bra (pull down sports bra)   Pull over shirt/dress - Perfomed by patient: Thread/unthread right sleeve, Thread/unthread left sleeve, Put head through opening, Pull shirt over trunk          Upper body assist Assist Level: Supervision or verbal cues      Lower Body Dressing/Undressing Lower body dressing   What is the patient wearing?: Pants, Underwear Underwear - Performed by patient: Thread/unthread right underwear leg, Thread/unthread left underwear leg, Pull underwear up/down Underwear - Performed by  helper: Thread/unthread right underwear leg, Thread/unthread left underwear leg Pants- Performed by patient: Thread/unthread right pants leg, Thread/unthread left pants leg, Pull pants up/down Pants- Performed by helper: Thread/unthread right pants leg   Non-skid slipper socks- Performed by helper: Don/doff right sock, Don/doff left sock                  Lower  body assist Assist for lower body dressing:  (3/8, 38%, max assist)      Toileting Toileting   Toileting steps completed by patient: Adjust clothing prior to toileting, Performs perineal hygiene, Adjust clothing after toileting   Toileting Assistive Devices: Grab bar or rail  Toileting assist Assist level: Touching or steadying assistance (Pt.75%)   Transfers Chair/bed transfer   Chair/bed transfer method: Ambulatory Chair/bed transfer assist level: Supervision or verbal cues Chair/bed transfer assistive device: Environmental consultant, Air cabin crew     Max distance: 120 Assist level: Supervision or verbal cues   Wheelchair   Type: Manual Max wheelchair distance: 160 Assist Level: Supervision or verbal cues  Cognition Comprehension Comprehension assist level: Follows complex conversation/direction with no assist  Expression Expression assist level: Expresses complex ideas: With no assist  Social Interaction Social Interaction assist level: Interacts appropriately with others - No medications needed.  Problem Solving Problem solving assist level: Solves complex problems: Recognizes & self-corrects  Memory Memory assist level: Complete Independence: No helper    Medical Problem List and Plan: 1. Lower extremity weakness and sensory deficits secondary to L4-5 stenosis with myelopathy status post decompression and fusion  Cont CIR 2. DVT Prophylaxis/Anticoagulation: Subcutaneous Lovenox. Monitor for any bleeding episodes. SCDs 3. Pain Management:   Neurontin 300 mg 3 times a day  OxyContin sustained release 20 mg every 12 hours d/ced on 7/6  Robaxin, Ultram, and oxycodone as needed.   Monitor with increased mobility  Cont to wean as tolerated 4. Acute blood loss anemia.   Hb 9.0 on 7/7  Iron supplement started on 7/7  Cont to monitor 5. Neuropsych: This patient is capable of making decisions on her own behalf. 6. Skin/Wound Care: Routine skin checks 7.  Fluids/Electrolytes/Nutrition: Routine I&O  8.Hypertension. Cont Lisinopril 10 mg daily, Lasix 80 mg daily. 9. Chronic renal insufficiency. Creatinine 1.27-1.42.   Cr. 1.05 on 7/7 (improved)  Cont to monitor 10. Morbid obesity. Follow-up dietary 11. Constipation. Laxative assistance 12. UTI  Ucx from 7/2 with Proteus and E.Coli  Cont keflex started on 7/4-7/11 13. Leukocytosis:   Resolved  Likely secondary to UTI  Will cont to monitor 14. LE edema  Increased lasix to 80mg  daily, 40 every evening  May need Echo as outpt Filed Weights   01/14/16 1630 01/15/16 0522 01/16/16 0450  Weight: 124.6 kg (274 lb 11.1 oz) 124.2 kg (273 lb 13 oz) 124.2 kg (273 lb 13 oz)   15. Pruritis  Likekly secondary to hygiene, encourage cleaning  Will also order Claritin PRN 16. Hypokalemia  K 3.3 on 7/7  Will supplement  LOS (Days) 4 A FACE TO FACE EVALUATION WAS PERFORMED  Ankit Lorie Phenix 01/16/2016 10:46 AM

## 2016-01-16 NOTE — Progress Notes (Signed)
Physical Therapy Session Note  Patient Details  Name: Caitlin Taylor MRN: [id number] Date of Birth: 1971/06/29  Today's Date: 01/16/2016  PT Individual Time: [id number] AND 1616-1700 PT Individual Time Calculation (min): 59 min  AND 52min   Short Term Goals: Week 1:  PT Short Term Goal 1 (Week 1): STG = LTG Due to short ELOS.  Skilled Therapeutic Interventions/Progress Updates:    Session 1 Patient received sitting in [facility] and agreeable to PT. Patient reports wearing ace wrap all night, so PT removed Ace bandage for treatment.  Gait training performed for 130ft  With RW in simulated community environment of hospital gift shop. PT provided min cues for AD management in tight spaces and cues for proper turning technique in tight places to maintain back precautions.    Gait training in controlled environment for 178ft x 2, 151ft, and 46ft x 2 with RW and supervision A from PT. PT provided min cues for improve toe off with gait to allow increased step length and cadence. Only minor improvements in step length noted following instruction from PT. Patient continues to demonstrated mild decreased pelvic control on the R LE secondary to gluteal weakness.   PT instructed patient in Bed mobility for sit<>supine and Roll to L and R with supervision A. Min cues provided to improve maintenance of back precautions. Patient self selected supine through long sit to come to supine position and log roll  Technique for supine to sit.   PT wrapped BLE for edema control at end of treatment session and left patient in [facility] with call bell within reach.   Session 2   PT instructed patient in Gait training for 114ft x 2, 123ft,  And 54ft x2 with RW and supervision A. Patient noted to have improved step length this session compared to morning session.  Stair training educated by PT x 8 steps (6") with BUE support and supervision - Min A from PT. Mod cues for improved step to gait pattern and increased use of BUE as  needed. Patient was unable to recall proper step to gait pattern prior to instruction from PT.   Dynamic Standing balance: Mini lateral and forward toe traps x 6 BLE  Mini lunges x 8 BLE  Foot taps on 2 inch step x 8 BLE without UE support.  Throughout all balance training, PT provided min A for improved safety as well as min-mod cues for improved weight shift over stance leg to allow increased step length and step height. Patient noted to have 2 near LOB with min-mod A from PT to correct. PT also noted increased difficulty with stance on R LE while performed dynamic balance training.   Patient returned to room in [facility] and left sitting with call bell within reach.    Therapy Documentation Precautions:  Precautions Precautions: Fall, Back Precaution Comments: able to recall 3/3 precautions Required Braces or Orthoses: Spinal Brace Spinal Brace: Lumbar corset, Applied in sitting position Restrictions Weight Bearing Restrictions: No General:   Vital Signs: Therapy Vitals BP: 130/67 mmHg Pain:    0/10   See Function Navigator for Current Functional Status.   Therapy/Group: Individual Therapy  Lorie Phenix 01/16/2016, 11:08 AM

## 2016-01-16 NOTE — Progress Notes (Signed)
Occupational Therapy Session Note  Patient Details  Name: Caitlin Taylor MRN: [id number] Date of Birth: [birth year]  Today's Date: 01/16/2016 OT Individual Time: 1430-1501 OT Individual Time Calculation (min): 31 min    Skilled Therapeutic Interventions/Progress Updates:    Pt was alone in bathroom upon skilled OT arrival. Pt reported that nursing assisted with RW transfer and to alert nursing once finished voiding. Pt did not have back brace on while toileting and was provided brace to don while seated with supervision. Pt completed toileting tasks with steadying assistance and instruction on adaptive technique for preventing pants from falling during standing. Pt was provided education on wearing brace whenever out of bed with verbalized understanding. "I know I just really had to go!" Nursing made aware of pt not wearing brace while out of bed with verbalized understanding. Pt completed kitchen tasks in therapy kitchen with RW for remainder of session. Pt was provided education on adaptive techniques, energy conservation principles, and kitchen modifications to ensure safe completion of kitchen tasks post discharge. Pt was provided instruction on use of AE for adherence to back precautions, as well as proper RW placement during drawer and fridge opening. Pt verbalized understanding and was engaged in problem solving ways to transport pans from stove to sink to wash once cooled. Pt reported being interested in having RW basket as well as gripper bag for home use. Pt returned to room in w/c with bilateral leg rests elevated and all needs within reach. 3/3 recall back precautions.   Therapy Documentation Precautions:  Precautions Precautions: Fall, Back Precaution Comments: able to recall 3/3 precautions Required Braces or Orthoses: Spinal Brace Spinal Brace: Lumbar corset, Applied in sitting position Restrictions Weight Bearing Restrictions: No    See Function Navigator for Current Functional  Status.   Therapy/Group: Individual Therapy  Rayetta Veith A Iysha Mishkin 01/16/2016, 4:31 PM

## 2016-01-17 ENCOUNTER — Inpatient Hospital Stay (HOSPITAL_COMMUNITY): Payer: 59 | Admitting: Physical Therapy

## 2016-01-17 ENCOUNTER — Inpatient Hospital Stay (HOSPITAL_COMMUNITY): Payer: 59 | Admitting: Occupational Therapy

## 2016-01-17 LAB — URINE CULTURE

## 2016-01-17 NOTE — Progress Notes (Signed)
Physical Therapy Session Note  Patient Details  Name: Caitlin Taylor MRN: [id number] Date of Birth: September 19, 1970  Today's Date: 01/17/2016 PT Individual Time: [id number] AND 1616-1700 PT Individual Time Calculation (min): 45 min AND 84min   Short Term Goals: Week 1:  PT Short Term Goal 1 (Week 1): STG = LTG Due to short ELOS.  Skilled Therapeutic Interventions/Progress Updates:    Patient received sidelying  in bed and agreeable to PT.  PT instructed patient in bed mobility for sidelying to sit with supervision A and slight use of bed rails.  Gait training performed in hall for 171ft with RW and supervision A from PT with min cues for improved posture Patient transported via [place] to Carpeted waiting area. Gait training performed for 173ft and 6ft with RW to simulate home environment. Min cues for improved turning technique to maintain back precautions.   WC mobility for 13ft with supervision A and min verbal cues for doorway management and improved turns in tight area. Isidore Moos transfer training to couch x 3 with supervision A and mod cues for improved techique with increased use of momentum and proper UE positioning.  Patient returned to room and left sitting in [facility] with call bell in reach.     Session 2 Gait training for 187ft x 2 with RW and supervision A from PT min cues from PT for improved step length.   Balance training.  Standing on Airex pad with BUE support. For 45 seconds with supervision A. No UE support x 1 minute with supervision A.  Lateral and Anterior/posterior pertubations without UE support x 15 each direction with min A from PT to prevent posterior LOB x 4 and BUE support to prevent anterior LOB.   Seated therex with level 2 tband BLE Knee flexion x 10 Knee extension, x10 Hip flexion x10 Hip abduction x10 PT provided min verbal cues for improved positioning to prevent compensatory movements, increased ROM, and decreased speed of movement to enhance  strengthening aspect of movement.   Patient left sitting in WC with call bell in reach.   Therapy Documentation Precautions:  Precautions Precautions: Fall, Back Precaution Comments: able to recall 3/3 precautions Required Braces or Orthoses: Spinal Brace Spinal Brace: Lumbar corset, Applied in sitting position Restrictions Weight Bearing Restrictions: No Pain: Pain Assessment Pain Assessment: No/denies pain Pain Score: 0-No pain   See Function Navigator for Current Functional Status.   Therapy/Group: Individual Therapy  Lorie Phenix 01/17/2016, 9:49 AM

## 2016-01-17 NOTE — Progress Notes (Signed)
Occupational Therapy Session Note  Patient Details  Name: Caitlin Taylor MRN: [id number] Date of Birth: 03-Mar-1971  Today's Date: 01/17/2016 OT Individual Time: [id number] OT Individual Time Calculation (min): 60 min    Skilled Therapeutic Interventions/Progress Updates: Patient seen for patient education as follows:  Reviewed her OT goals;   Toilet tranfer via RW and extra wide 3:1 with close S;   toileting with supervision.   She was able to reach buttocks with lateral leans to her right.        Therapy Documentation Precautions:  Precautions Precautions: Fall, Back Precaution Comments: able to recall 3/3 precautions Required Braces or Orthoses: Spinal Brace Spinal Brace: Lumbar corset, Applied in sitting position Restrictions Weight Bearing Restrictions: No  Pain:denied    See Function Navigator for Current Functional Status.   Therapy/Group: Individual Therapy  Alfredia Ferguson [facility] 01/17/2016, 4:52 PM

## 2016-01-17 NOTE — Progress Notes (Signed)
[place] [facility] MEDICINE & REHABILITATION     PROGRESS NOTE  Subjective/Complaints:  Pt laying in bed.  She states she slept poorly because of her bed and requests another bed.  She notes improvement in edema and would like to try compression hose.    ROS: +LE edema. Denies CP, SOB, N/V/D.  Objective: Vital Signs: Blood pressure 138/68, pulse 106, temperature 98.5 F (36.9 C), temperature source Oral, resp. rate 18, height 5\' 3"  (1.6 m), weight 123.832 kg (273 lb), last menstrual period 12/28/2015, SpO2 95 %. No results found.  Recent Labs  01/16/16 0610  WBC 7.0  HGB 9.0*  HCT 27.2*  PLT 197    Recent Labs  01/16/16 0610  NA 139  K 3.3*  CL 99*  GLUCOSE 102*  BUN 12  CREATININE 1.05*  CALCIUM 8.8*   CBG (last 3)  No results for input(s): GLUCAP in the last 72 hours.  Wt Readings from Last 3 Encounters:  01/17/16 123.832 kg (273 lb)  01/05/16 122.108 kg (269 lb 3.2 oz)    Physical Exam:  BP 138/68 mmHg  Pulse 106  Temp(Src) 98.5 F (36.9 C) (Oral)  Resp 18  Ht 5\' 3"  (1.6 m)  Wt 123.832 kg (273 lb)  BMI 48.37 kg/m2  SpO2 95%  LMP 12/28/2015 Constitutional: She appears well-developed. Obese. NAD  HENT: Normocephalic. Atraumatic.  Eyes: Conjunctivae and EOM are normal.  Cardiovascular: Normal rate and regular rhythm.No murmur heard. Respiratory: Effort normal and breath sounds normal. No stridor. No respiratory distress. She has no wheezes.  GI: Soft. Bowel sounds are normal. She exhibits no distension. There is no tenderness.  Musculoskeletal: She exhibits edema. BLE (slightly improved) Neurological: She is alert and oriented.   Speech clear.  Follows commands without difficulty.  Motor: B/l UE 4+/5 proximal to distal LLE: Hip flexion 4+/5, knee extension 4+/5, ankle dorsi/plantar flexion 4+/5 RLE: HF 4/5, KE 4+/5, ADF/PF 4/5.  Skin: Back incision with dressing c/d/i. No erythema.  Warm and dry. Psychiatric: Very pleasant. She has a normal  mood and affect. Her behavior is normal. Judgment and thought content normal   Assessment/Plan: 1. Functional deficits secondary to to L4-5 stenosis with myelopathy status post decompression and fusion which require 3+ hours per day of interdisciplinary therapy in a comprehensive inpatient rehab setting. Physiatrist is providing close team supervision and 24 hour management of active medical problems listed below. Physiatrist and rehab team continue to assess barriers to discharge/monitor patient progress toward functional and medical goals.  Function:  Bathing Bathing position   Position: Shower  Bathing parts Body parts bathed by patient: Right arm, Left arm, Front perineal area, Buttocks Body parts bathed by helper: Right lower leg, Left lower leg, Back  Bathing assist Assist Level: Touching or steadying assistance(Pt > 75%)      Upper Body Dressing/Undressing Upper body dressing   What is the patient wearing?: Pull over shirt/dress, Bra Bra - Perfomed by patient: Thread/unthread right bra strap, Thread/unthread left bra strap, Hook/unhook bra (pull down sports bra)   Pull over shirt/dress - Perfomed by patient: Thread/unthread right sleeve, Thread/unthread left sleeve, Put head through opening, Pull shirt over trunk          Upper body assist Assist Level: Supervision or verbal cues      Lower Body Dressing/Undressing Lower body dressing   What is the patient wearing?: Pants, Underwear Underwear - Performed by patient: Thread/unthread right underwear leg, Thread/unthread left underwear leg, Pull underwear up/down Underwear - Performed by  helper: Thread/unthread right underwear leg, Thread/unthread left underwear leg Pants- Performed by patient: Thread/unthread right pants leg, Thread/unthread left pants leg, Pull pants up/down Pants- Performed by helper: Thread/unthread right pants leg   Non-skid slipper socks- Performed by helper: Don/doff right sock, Don/doff left sock                   Lower body assist Assist for lower body dressing:  (3/8, 38%, max assist)      Toileting Toileting   Toileting steps completed by patient: Adjust clothing prior to toileting, Performs perineal hygiene, Adjust clothing after toileting   Toileting Assistive Devices: Grab bar or rail  Toileting assist Assist level: Touching or steadying assistance (Pt.75%)   Transfers Chair/bed transfer   Chair/bed transfer method: Ambulatory Chair/bed transfer assist level: Supervision or verbal cues Chair/bed transfer assistive device: Armrests, Medical sales representative     Max distance: 175ft Assist level: Supervision or verbal cues   Wheelchair   Type: Manual Max wheelchair distance: 160 Assist Level: Supervision or verbal cues  Cognition Comprehension Comprehension assist level: Follows complex conversation/direction with no assist  Expression Expression assist level: Expresses complex ideas: With no assist  Social Interaction Social Interaction assist level: Interacts appropriately with others - No medications needed.  Problem Solving Problem solving assist level: Solves complex problems: Recognizes & self-corrects  Memory Memory assist level: Complete Independence: No helper    Medical Problem List and Plan: 1. Lower extremity weakness and sensory deficits secondary to L4-5 stenosis with myelopathy status post decompression and fusion  Cont CIR  Will order low airloss mattress for back 2. DVT Prophylaxis/Anticoagulation: Subcutaneous Lovenox. Monitor for any bleeding episodes. SCDs 3. Pain Management:   Neurontin 300 mg 3 times a day  OxyContin sustained release 20 mg every 12 hours d/ced on 7/6  Robaxin, Ultram, and oxycodone as needed.   Monitor with increased mobility  Cont to wean as tolerated 4. Acute blood loss anemia.   Hb 9.0 on 7/7  Iron supplement started on 7/7  Labs ordered for Monday 5. Neuropsych: This patient is capable of making  decisions on her own behalf. 6. Skin/Wound Care: Routine skin checks 7. Fluids/Electrolytes/Nutrition: Routine I&O  8.Hypertension. Cont Lisinopril 10 mg daily, Lasix 80 mg daily. 9. Chronic renal insufficiency. Creatinine 1.27-1.42.   Cr. 1.05 on 7/7 (improved)  Cont to monitor 10. Morbid obesity. Follow-up dietary 11. Constipation. Laxative assistance 12. UTI  Ucx from 7/2 with Proteus and E.Coli  Cont keflex started on 7/4-7/11 13. Leukocytosis:   Resolved  Likely secondary to UTI  Will cont to monitor 14. LE edema  Increased lasix to 80mg  daily, 40 every evening  May need Echo as outpt  Educated on importance of elevation  Will trial TED hose Filed Weights   01/15/16 0522 01/16/16 0450 01/17/16 0529  Weight: 124.2 kg (273 lb 13 oz) 124.2 kg (273 lb 13 oz) 123.832 kg (273 lb)   15. Pruritis  Likekly secondary to hygiene, encourage cleaning  Ordered Claritin PRN  Improving 16. Hypokalemia  K 3.3 on 7/7  Supplementing  Labs ordered for Monday  LOS (Days) 5 A FACE TO FACE EVALUATION WAS PERFORMED  Leo Fray Lorie Phenix 01/17/2016 8:30 AM

## 2016-01-17 NOTE — Progress Notes (Signed)
Occupational Therapy Session Note  Patient Details  Name: NICY SOLO MRN: [id number] Date of Birth: Sep 02, 1970  Today's Date: 01/17/2016 OT Individual Time: 1400-1545 OT Individual Time Calculation (min): 105 min    Short Term Goals: Week 1:  OT Short Term Goal 1 (Week 1): STGs equal to LTGs set at modified independent level based on ELOS.  Skilled Therapeutic Interventions/Progress Updates: Patient participated as follows:  Clinician donned TED hose  Patient and clinician worked at length to lace shoes at the tension she was able to tie before hand and still use the adaptive equipment to get her foot into the shoe with [place].   Husband demosnrated safe toilet transfers via walker with wife when she needed to toilet.    ALso patient completed endurance exercises seated in her wheelchair.      Therapy Documentation Precautions:  Precautions Precautions: Fall, Back Precaution Comments: able to recall 3/3 precautions Required Braces or Orthoses: Spinal Brace Spinal Brace: Lumbar corset, Applied in sitting position Restrictions Weight Bearing Restrictions: No General:   Vital Signs: Therapy Vitals Temp: 98.2 F (36.8 C) Temp Source: Oral Pulse Rate: (!) 108 Resp: 20 BP: 111/69 mmHg Patient Position (if appropriate): Sitting Oxygen Therapy SpO2: 99 % O2 Device: Not Delivered Pain:   ADL:   Exercises:   Other Treatments:    See Function Navigator for Current Functional Status.   Therapy/Group: Individual Therapy  Alfredia Ferguson [facility] 01/17/2016, 5:36 PM

## 2016-01-18 MED ORDER — FUROSEMIDE 40 MG PO TABS
80.0000 mg | ORAL_TABLET | Freq: Every evening | ORAL | Status: DC
  Administered 2016-01-18 – 2016-01-20 (×3): 80 mg via ORAL
  Filled 2016-01-18 (×3): qty 2

## 2016-01-18 NOTE — Progress Notes (Signed)
[place] [facility] MEDICINE & REHABILITATION     PROGRESS NOTE  Subjective/Complaints:  Pt laying in bed.  She notes improvement with the TEDs yesterday and would like another pair, so that she may alternate with washing.  She has questions about discharge equipment.   ROS: +Foot edema. Denies CP, SOB, N/V/D.  Objective: Vital Signs: Blood pressure 148/81, pulse 98, temperature 97.7 F (36.5 C), temperature source Oral, resp. rate 18, height 5\' 3"  (1.6 m), weight 127.007 kg (280 lb), last menstrual period 12/28/2015, SpO2 98 %. No results found.  Recent Labs  01/16/16 0610  WBC 7.0  HGB 9.0*  HCT 27.2*  PLT 197    Recent Labs  01/16/16 0610  NA 139  K 3.3*  CL 99*  GLUCOSE 102*  BUN 12  CREATININE 1.05*  CALCIUM 8.8*   CBG (last 3)  No results for input(s): GLUCAP in the last 72 hours.  Wt Readings from Last 3 Encounters:  01/18/16 127.007 kg (280 lb)  01/05/16 122.108 kg (269 lb 3.2 oz)    Physical Exam:  BP 148/81 mmHg  Pulse 98  Temp(Src) 97.7 F (36.5 C) (Oral)  Resp 18  Ht 5\' 3"  (1.6 m)  Wt 127.007 kg (280 lb)  BMI 49.61 kg/m2  SpO2 98%  LMP 12/28/2015 Constitutional: She appears well-developed. Obese. NAD  HENT: Normocephalic. Atraumatic.  Eyes: Conjunctivae and EOM are normal.  Cardiovascular: Normal rate and regular rhythm.No murmur heard. Respiratory: Effort normal and breath sounds normal. No stridor. No respiratory distress. She has no wheezes.  GI: Soft. Bowel sounds are normal. She exhibits no distension. There is no tenderness.  Musculoskeletal: She exhibits edema in b/l feet.  Neurological: She is alert and oriented.   Speech clear.  Follows commands without difficulty.  Motor: B/l UE 4+/5 proximal to distal LLE: Hip flexion 4+/5, knee extension 4+/5, ankle dorsi/plantar flexion 4+/5 RLE: HF 4/5, KE 4+/5, ADF/PF 4/5.  Skin: Back incision with dressing c/d/i. No erythema.  Warm and dry. Psychiatric: Very pleasant. She has a  normal mood and affect. Her behavior is normal. Judgment and thought content normal   Assessment/Plan: 1. Functional deficits secondary to to L4-5 stenosis with myelopathy status post decompression and fusion which require 3+ hours per day of interdisciplinary therapy in a comprehensive inpatient rehab setting. Physiatrist is providing close team supervision and 24 hour management of active medical problems listed below. Physiatrist and rehab team continue to assess barriers to discharge/monitor patient progress toward functional and medical goals.  Function:  Bathing Bathing position   Position: Shower  Bathing parts Body parts bathed by patient: Right arm, Left arm, Front perineal area, Buttocks Body parts bathed by helper: Right lower leg, Left lower leg, Back  Bathing assist Assist Level: Touching or steadying assistance(Pt > 75%)      Upper Body Dressing/Undressing Upper body dressing   What is the patient wearing?: Pull over shirt/dress, Bra Bra - Perfomed by patient: Thread/unthread right bra strap, Thread/unthread left bra strap, Hook/unhook bra (pull down sports bra)   Pull over shirt/dress - Perfomed by patient: Thread/unthread right sleeve, Thread/unthread left sleeve, Put head through opening, Pull shirt over trunk          Upper body assist Assist Level: Supervision or verbal cues      Lower Body Dressing/Undressing Lower body dressing   What is the patient wearing?: Pants, Underwear Underwear - Performed by patient: Thread/unthread right underwear leg, Thread/unthread left underwear leg, Pull underwear up/down Underwear - Performed by  helper: Thread/unthread right underwear leg, Thread/unthread left underwear leg Pants- Performed by patient: Thread/unthread right pants leg, Thread/unthread left pants leg, Pull pants up/down Pants- Performed by helper: Thread/unthread right pants leg   Non-skid slipper socks- Performed by helper: Don/doff right sock, Don/doff left  sock                  Lower body assist Assist for lower body dressing:  (3/8, 38%, max assist)      Toileting Toileting   Toileting steps completed by patient: Adjust clothing prior to toileting, Performs perineal hygiene, Adjust clothing after toileting   Toileting Assistive Devices: Grab bar or rail  Toileting assist Assist level: Touching or steadying assistance (Pt.75%)   Transfers Chair/bed transfer   Chair/bed transfer method: Ambulatory Chair/bed transfer assist level: Supervision or verbal cues Chair/bed transfer assistive device: Armrests, Medical sales representative     Max distance: 125ft Assist level: Supervision or verbal cues   Wheelchair   Type: Manual Max wheelchair distance: 160 Assist Level: Supervision or verbal cues  Cognition Comprehension Comprehension assist level: Follows complex conversation/direction with no assist  Expression Expression assist level: Expresses complex ideas: With no assist  Social Interaction Social Interaction assist level: Interacts appropriately with others - No medications needed.  Problem Solving Problem solving assist level: Solves complex problems: Recognizes & self-corrects  Memory Memory assist level: Complete Independence: No helper    Medical Problem List and Plan: 1. Lower extremity weakness and sensory deficits secondary to L4-5 stenosis with myelopathy status post decompression and fusion  Cont CIR  Will order low airloss mattress for back 2. DVT Prophylaxis/Anticoagulation: Subcutaneous Lovenox. Monitor for any bleeding episodes. SCDs 3. Pain Management:   Neurontin 300 mg 3 times a day  OxyContin sustained release 20 mg every 12 hours d/ced on 7/6  Robaxin, Ultram, and oxycodone as needed.   Monitor with increased mobility  Cont to wean as tolerated 4. Acute blood loss anemia.   Hb 9.0 on 7/7  Iron supplement started on 7/7  Labs ordered for Monday 5. Neuropsych: This patient is capable of  making decisions on her own behalf. 6. Skin/Wound Care: Routine skin checks 7. Fluids/Electrolytes/Nutrition: Routine I&O  8.Hypertension. Cont Lisinopril 10 mg daily, Lasix 80 mg daily. 9. Chronic renal insufficiency. Creatinine 1.27-1.42.   Cr. 1.05 on 7/7 (improved)  Cont to monitor 10. Morbid obesity. Follow-up dietary 11. Constipation. Laxative assistance 12. UTI  Ucx from 7/2 with Proteus and E.Coli  Cont keflex started on 7/4-7/11 13. Leukocytosis:   Resolved  Likely secondary to UTI  Will cont to monitor 14. LE edema - now mainly in feet  Increased lasix to 80mg  daily, 40 every evening, increased to 80 qhs  May need Echo as outpt  Educated on importance of elevation  TED hose Filed Weights   01/17/16 0529 01/18/16 0429 01/18/16 0500  Weight: 123.832 kg (273 lb) 120.748 kg (266 lb 3.2 oz) 127.007 kg (280 lb)   Unreliable recordings 15. Pruritis  Likekly secondary to hygiene, encourage cleaning  Ordered Claritin PRN   Improving 16. Hypokalemia  K 3.3 on 7/7  Supplemented  Labs ordered for Monday  LOS (Days) 6 A FACE TO FACE EVALUATION WAS PERFORMED  Abisola Carrero Lorie Phenix 01/18/2016 7:40 AM

## 2016-01-19 ENCOUNTER — Inpatient Hospital Stay (HOSPITAL_COMMUNITY): Payer: 59 | Admitting: Physical Therapy

## 2016-01-19 ENCOUNTER — Inpatient Hospital Stay (HOSPITAL_COMMUNITY): Payer: 59 | Admitting: Occupational Therapy

## 2016-01-19 LAB — BASIC METABOLIC PANEL
Anion gap: 8 (ref 5–15)
BUN: 14 mg/dL (ref 6–20)
CHLORIDE: 103 mmol/L (ref 101–111)
CO2: 29 mmol/L (ref 22–32)
CREATININE: 1.16 mg/dL — AB (ref 0.44–1.00)
Calcium: 9 mg/dL (ref 8.9–10.3)
GFR calc Af Amer: >60 mL/min (ref >60)
GFR calc non Af Amer: 56 mL/min — ABNORMAL LOW (ref >60)
GLUCOSE: 92 mg/dL (ref 65–99)
Potassium: 3.3 mmol/L — ABNORMAL LOW (ref 3.5–5.1)
Sodium: 140 mmol/L (ref 135–145)

## 2016-01-19 LAB — CBC WITH DIFFERENTIAL/PLATELET
Basophils Absolute: 0 10*3/uL (ref 0.0–0.1)
Basophils Relative: 0 %
EOS ABS: 0.1 10*3/uL (ref 0.0–0.7)
EOS PCT: 1 %
HCT: 32 % — ABNORMAL LOW (ref 36.0–46.0)
HEMOGLOBIN: 10.4 g/dL — AB (ref 12.0–15.0)
LYMPHS ABS: 0.9 10*3/uL (ref 0.7–4.0)
Lymphocytes Relative: 9 %
MCH: 30.3 pg (ref 26.0–34.0)
MCHC: 32.5 g/dL (ref 30.0–36.0)
MCV: 93.3 fL (ref 78.0–100.0)
MONOS PCT: 6 %
Monocytes Absolute: 0.6 10*3/uL (ref 0.1–1.0)
NEUTROS PCT: 84 %
Neutro Abs: 8 10*3/uL — ABNORMAL HIGH (ref 1.7–7.7)
Platelets: 282 10*3/uL (ref 150–400)
RBC: 3.43 MIL/uL — ABNORMAL LOW (ref 3.87–5.11)
RDW: 15.2 % (ref 11.5–15.5)
WBC: 9.6 10*3/uL (ref 4.0–10.5)

## 2016-01-19 LAB — CREATININE, SERUM
CREATININE: 1.19 mg/dL — AB (ref 0.44–1.00)
GFR calc Af Amer: >60 mL/min (ref >60)
GFR calc non Af Amer: 55 mL/min — ABNORMAL LOW (ref >60)

## 2016-01-19 NOTE — Progress Notes (Signed)
[place] [facility] MEDICINE & REHABILITATION     PROGRESS NOTE  Subjective/Complaints:  Pt sitting up in her chair eating breakfast.  She woke up early to do her hair this AM.    ROS: +B/l Foot edema. Denies CP, SOB, N/V/D.  Objective: Vital Signs: Blood pressure 151/86, pulse 103, temperature 98.4 F (36.9 C), temperature source Oral, resp. rate 18, height 5\' 3"  (1.6 m), weight 121.292 kg (267 lb 6.4 oz), last menstrual period 12/28/2015, SpO2 96 %. No results found. No results for input(s): WBC, HGB, HCT, PLT in the last 72 hours.  Recent Labs  01/19/16 0037  CREATININE 1.19*   CBG (last 3)  No results for input(s): GLUCAP in the last 72 hours.  Wt Readings from Last 3 Encounters:  01/19/16 121.292 kg (267 lb 6.4 oz)  01/05/16 122.108 kg (269 lb 3.2 oz)    Physical Exam:  BP 151/86 mmHg  Pulse 103  Temp(Src) 98.4 F (36.9 C) (Oral)  Resp 18  Ht 5\' 3"  (1.6 m)  Wt 121.292 kg (267 lb 6.4 oz)  BMI 47.38 kg/m2  SpO2 96%  LMP 12/28/2015 Constitutional: She appears well-developed. Obese. NAD  HENT: Normocephalic. Atraumatic.  Eyes: Conjunctivae and EOM are normal.  Cardiovascular: Normal rate and regular rhythm.No murmur heard. Respiratory: Effort normal and breath sounds normal. No stridor. No respiratory distress. She has no wheezes.  GI: Soft. Bowel sounds are normal. She exhibits no distension. There is no tenderness.  Musculoskeletal: She exhibits edema in b/l feet.  Neurological: She is alert and oriented.   Speech clear.  Follows commands without difficulty.  Motor: B/l UE 4+/5 proximal to distal LLE: Hip flexion 4+/5, knee extension 4+/5, ankle dorsi/plantar flexion 4+/5 RLE: HF 4/5, KE 4+/5, ADF/PF 4/5.  Skin: Back incision with dressing c/d/i. No erythema.  Warm and dry. Psychiatric: Very pleasant. She has a normal mood and affect. Her behavior is normal. Judgment and thought content normal   Assessment/Plan: 1. Functional deficits secondary to  to L4-5 stenosis with myelopathy status post decompression and fusion which require 3+ hours per day of interdisciplinary therapy in a comprehensive inpatient rehab setting. Physiatrist is providing close team supervision and 24 hour management of active medical problems listed below. Physiatrist and rehab team continue to assess barriers to discharge/monitor patient progress toward functional and medical goals.  Function:  Bathing Bathing position   Position: Shower  Bathing parts Body parts bathed by patient: Right arm, Left arm, Front perineal area, Buttocks Body parts bathed by helper: Right lower leg, Left lower leg, Back  Bathing assist Assist Level: Touching or steadying assistance(Pt > 75%)      Upper Body Dressing/Undressing Upper body dressing   What is the patient wearing?: Pull over shirt/dress, Bra Bra - Perfomed by patient: Thread/unthread right bra strap, Thread/unthread left bra strap, Hook/unhook bra (pull down sports bra)   Pull over shirt/dress - Perfomed by patient: Thread/unthread right sleeve, Thread/unthread left sleeve, Put head through opening, Pull shirt over trunk          Upper body assist Assist Level: Supervision or verbal cues      Lower Body Dressing/Undressing Lower body dressing   What is the patient wearing?: Pants, Underwear Underwear - Performed by patient: Thread/unthread right underwear leg, Thread/unthread left underwear leg, Pull underwear up/down Underwear - Performed by helper: Thread/unthread right underwear leg, Thread/unthread left underwear leg Pants- Performed by patient: Thread/unthread right pants leg, Thread/unthread left pants leg, Pull pants up/down Pants- Performed by helper: Thread/unthread  right pants leg   Non-skid slipper socks- Performed by helper: Don/doff right sock, Don/doff left sock                  Lower body assist Assist for lower body dressing:  (3/8, 38%, max assist)      Toileting Toileting    Toileting steps completed by patient: Adjust clothing prior to toileting, Performs perineal hygiene, Adjust clothing after toileting   Toileting Assistive Devices: Grab bar or rail  Toileting assist Assist level: Touching or steadying assistance (Pt.75%)   Transfers Chair/bed transfer   Chair/bed transfer method: Ambulatory Chair/bed transfer assist level: Supervision or verbal cues Chair/bed transfer assistive device: Armrests, Medical sales representative     Max distance: 137ft Assist level: Supervision or verbal cues   Wheelchair   Type: Manual Max wheelchair distance: 160 Assist Level: Supervision or verbal cues  Cognition Comprehension Comprehension assist level: Follows complex conversation/direction with no assist  Expression Expression assist level: Expresses complex ideas: With no assist  Social Interaction Social Interaction assist level: Interacts appropriately with others - No medications needed.  Problem Solving Problem solving assist level: Solves complex problems: Recognizes & self-corrects  Memory Memory assist level: Complete Independence: No helper    Medical Problem List and Plan: 1. Lower extremity weakness and sensory deficits secondary to L4-5 stenosis with myelopathy status post decompression and fusion  Cont CIR  Will order low airloss mattress for back 2. DVT Prophylaxis/Anticoagulation: Subcutaneous Lovenox. Monitor for any bleeding episodes. SCDs 3. Pain Management:   Neurontin 300 mg 3 times a day  OxyContin sustained release 20 mg every 12 hours d/ced on 7/6  Robaxin, Ultram, and oxycodone as needed.   Monitor with increased mobility  Cont to wean as tolerated, taking ~1/day 4. Acute blood loss anemia.   Hb 9.0 on 7/7  Iron supplement started on 7/7  Labs pending for Monday 5. Neuropsych: This patient is capable of making decisions on her own behalf. 6. Skin/Wound Care: Routine skin checks 7. Fluids/Electrolytes/Nutrition: Routine  I&O  8.Hypertension.   Cont Lisinopril 10 mg daily  Lasix 80 mg BID.  Overall controlled, increased in last 24 hours.  Will cont to monitor 9. Chronic renal insufficiency. Creatinine 1.27-1.42.   Cr. 1.05 on 7/7 (improved)  Cont to monitor  Labs pending for Monday 10. Morbid obesity. Follow-up dietary 11. Constipation. Laxative assistance 12. UTI  Ucx from 7/2 with Proteus and E.Coli  Cont keflex started on 7/4-7/11 13. Leukocytosis:   Resolved  Likely secondary to UTI  Will cont to monitor 14. LE edema - now mainly in feet - chronic present for >4 years  Increased lasix to 80mg  daily, 40 every evening, increased to 80 qhs  May need Echo as outpt  Educated on importance of elevation  TED hose Filed Weights   01/18/16 0429 01/18/16 0500 01/19/16 0500  Weight: 120.748 kg (266 lb 3.2 oz) 127.007 kg (280 lb) 121.292 kg (267 lb 6.4 oz)   Unreliable recordings 15. Pruritis: Resolved  Likekly secondary to hygiene, encourage cleaning  Ordered Claritin PRN  16. Hypokalemia  K 3.3 on 7/7  Supplemented  Labs pending for Monday  LOS (Days) 7 A FACE TO FACE EVALUATION WAS PERFORMED  Ankit Lorie Phenix 01/19/2016 8:40 AM

## 2016-01-19 NOTE — Progress Notes (Signed)
Occupational Therapy Session Note  Patient Details  Name: NANCYANN PADMORE MRN: [id number] Date of Birth: 1970-09-06  Today's Date: 01/19/2016 OT Individual Time: [id number] OT Individual Time Calculation (min): 85 min    Short Term Goals: Week 1:  OT Short Term Goal 1 (Week 1): STGs equal to LTGs set at modified independent level based on ELOS.  Skilled Therapeutic Interventions/Progress Updates:    Pt seen for skilled OT to facilitate safe and adaptive interventions to prepare for her discharge home on Wednesday. Pt stated she had already "freshened up" and dressed this am. Discussed plans to bathe tomorrow for her graduation day.  Pt stated she was very concerned about how her elderly mother will help her don the TED hose.  Discussed and practiced the technique where her mother sits in chair in front of her with pt's leg on mom's lap.  Used thick plastic bag (drink cup liner) on foot then placed TED hose over it.  This will be easier on her mom's hands. Pt then could pull up from shin level without breaking back precautions.  Pt then tried the same technique using the bag and then the sock aid with great success.  She then practiced donning non slip socks with aid, doffing with reacher and then donning shoe with shoe horn and reacher.   Reviewed safe bathing strategies with sequencing of tasks so pt will remain in brace for all standing.  Pt ambulated to ADL apartment to practice tub bench transfers with RW with no A.  Provided pt with walker bag to place toiletry items and clothing. Reviewed strategies with keeping towels on walker for close reach and lateral leans to not need to stand while in shower. Pt ambulated back to room and then to toilet. Pt on toilet stating she would contact RN when finished.   Therapy Documentation Precautions:  Precautions Precautions: Fall, Back Precaution Comments: able to recall 3/3 precautions Required Braces or Orthoses: Spinal Brace Spinal Brace: Lumbar  corset, Applied in sitting position Restrictions Weight Bearing Restrictions: No     Pain: Pain Assessment Pain Assessment: No/denies pain ADL:  See Function Navigator for Current Functional Status.   Therapy/Group: Individual Therapy  SAGUIER,JULIA 01/19/2016, 12:39 PM

## 2016-01-19 NOTE — Progress Notes (Signed)
Recreational Therapy Session Note  Patient Details  Name: Caitlin Taylor MRN: [id number] Date of Birth: 05/08/1971 Today's Date: 01/19/2016  Pain: no c/o  Order received.  Met with pt to discuss TR services, use of leisure time PTA and post discharge, activity analysis with potential adaptations, community pursuits, energy conservation & discharge planning.  No further TR as pt is expected to discharge home 7/12. Roarke Marciano 01/19/2016, 3:00 PM

## 2016-01-19 NOTE — Progress Notes (Signed)
Physical Therapy Session Note  Patient Details  Name: Caitlin Taylor MRN: [id number] Date of Birth: 1971-03-26  Today's Date: 01/19/2016 PT Individual Time: 0803-0900 and 1352-1448 PT Individual Time Calculation (min): 57 min and 56 min   Short Term Goals: Week 1:  PT Short Term Goal 1 (Week 1): STG = LTG Due to short ELOS.  Skilled Therapeutic Interventions/Progress Updates:    Treatment 1: Pt received in w/c & agreeable to PT, denying c/o pain. Session focused on pt education, d/c planning and endurance training. Reviewed pt's back precautions with pt independently recalling 3/3, educated pt on no lifting >5 lbs & provided specific examples (gallon of milk = 8 lbs). Discussed community access, educated pt to be aware of how she is feeling and when she needs rest breaks. Educated pt to wait to go out into community until endurance has improved more as pt continues to become SOB with minimal ambulation distances (50 ft) but once she does, encouraged pt to ambulate as much as possible, be aware of sitting areas for rest breaks, and to minimize use of electric scooters in stores. Reinforced that pt is not allowed to drive until cleared by doctor. Discussed benefits of HHPT versus OPPT. Provided total A for donning BLE ted hose for time management. Gait training from room>gift shop with RW & supervision; pt required multiple rest breaks every 100-150 ft. (HR after ambulating 50 ft = 129 bpm; discussed with RN who cleared pt for continued participation.) educated pt on pursed lip breathing during activity & she was able to return demonstrate. Pt continues to demonstrate trendelenburg gait, decreased gait speed & decreased step length BLE. At end of session pt returned to room & left sitting in w/c with all needs within reach.   Treatment 2: Pt received in w/c & agreeable to PT, denying c/o pain. Discussed how to transport items, prepare meal & plate, and need to hold on to RW at all times when  ambulating & pt voiced understanding. Gait training x 165 ft room>gym with RW & supervision/mod I. Pt with continued trendelenburg gait, decreased step length & decreased gait speed. In gym pt completed stair training on 6" steps with single rail to simulate church entrance. Pt able to negotiate 12 steps with supervision & standing rest breaks throughout activity. Pt continues to be limited by SOB with activity. Pt performed single step negotiation without rails & with RW, requiring supervision assist, to simulate home entry. Gait training x 50 ft + 110 ft with RW & supervision for endurance training. In rehab apartment pt able to retreive items from refrigerator and transport them to counter using walker bag with supervision A. Educated pt on building up height of soft couch with sturdy blanket (ex: quilt) to allow her increased ease of transfers on/off furniture, as well as she should not reach to high/low cabinets in order to maintain back precautions. At end of session pt left in w/c in room with all needs within reach.   Therapy Documentation Precautions:  Precautions Precautions: Fall, Back Precaution Comments: able to recall 3/3 precautions Required Braces or Orthoses: Spinal Brace Spinal Brace: Lumbar corset, Applied in sitting position Restrictions Weight Bearing Restrictions: No  Pain: Pain Assessment Pain Assessment: No/denies pain   See Function Navigator for Current Functional Status.   Therapy/Group: Individual Therapy  Waunita Schooner 01/19/2016, 7:48 AM

## 2016-01-20 ENCOUNTER — Inpatient Hospital Stay (HOSPITAL_COMMUNITY): Payer: 59 | Admitting: Physical Therapy

## 2016-01-20 ENCOUNTER — Inpatient Hospital Stay (HOSPITAL_COMMUNITY): Payer: 59 | Admitting: *Deleted

## 2016-01-20 ENCOUNTER — Inpatient Hospital Stay (HOSPITAL_COMMUNITY): Payer: 59 | Admitting: Occupational Therapy

## 2016-01-20 DIAGNOSIS — I10 Essential (primary) hypertension: Secondary | ICD-10-CM | POA: Insufficient documentation

## 2016-01-20 MED ORDER — POTASSIUM CHLORIDE CRYS ER 20 MEQ PO TBCR
40.0000 meq | EXTENDED_RELEASE_TABLET | Freq: Once | ORAL | Status: AC
  Administered 2016-01-20: 40 meq via ORAL
  Filled 2016-01-20: qty 2

## 2016-01-20 MED ORDER — POTASSIUM CHLORIDE CRYS ER 20 MEQ PO TBCR
20.0000 meq | EXTENDED_RELEASE_TABLET | Freq: Every day | ORAL | Status: DC
  Administered 2016-01-20 – 2016-01-21 (×2): 20 meq via ORAL
  Filled 2016-01-20 (×2): qty 1

## 2016-01-20 NOTE — Progress Notes (Signed)
[facility] PHYSICAL MEDICINE & REHABILITATION     PROGRESS NOTE  Subjective/Complaints:  Pt sitting up in her chair.  She slept well overnight.  She noticed a decreased in edema overnight, but an increase this AM after her feet had been hanging.    ROS: +B/l Foot edema. Denies CP, SOB, N/V/D.  Objective: Vital Signs: Blood pressure 121/70, pulse 96, temperature 98.4 F (36.9 C), temperature source Oral, resp. rate 18, height 5\' 3"  (1.6 m), weight 120.521 kg (265 lb 11.2 oz), last menstrual period 12/28/2015, SpO2 99 %. No results found.  Recent Labs  01/19/16 1128  WBC 9.6  HGB 10.4*  HCT 32.0*  PLT 282    Recent Labs  01/19/16 0037 01/19/16 1128  NA  --  140  K  --  3.3*  CL  --  103  GLUCOSE  --  92  BUN  --  14  CREATININE 1.19* 1.16*  CALCIUM  --  9.0   CBG (last 3)  No results for input(s): GLUCAP in the last 72 hours.  Wt Readings from Last 3 Encounters:  01/20/16 120.521 kg (265 lb 11.2 oz)  01/05/16 122.108 kg (269 lb 3.2 oz)    Physical Exam:  BP 121/70 mmHg  Pulse 96  Temp(Src) 98.4 F (36.9 C) (Oral)  Resp 18  Ht 5\' 3"  (1.6 m)  Wt 120.521 kg (265 lb 11.2 oz)  BMI 47.08 kg/m2  SpO2 99%  LMP 12/28/2015 Constitutional: She appears well-developed. Obese. NAD  HENT: Normocephalic. Atraumatic.  Eyes: Conjunctivae and EOM are normal.  Cardiovascular: Normal rate and regular rhythm.No murmur heard. Respiratory: Effort normal and breath sounds normal. No stridor. No respiratory distress. She has no wheezes.  GI: Soft. Bowel sounds are normal. She exhibits no distension. There is no tenderness.  Musculoskeletal: She exhibits edema in b/l feet.  Neurological: She is alert and oriented.   Speech clear.  Follows commands without difficulty.  Motor: B/l UE 4+/5 proximal to distal LLE: Hip flexion 4+/5, knee extension 4+/5, ankle dorsi/plantar flexion 4+/5 RLE: HF 4/5, KE 4+/5, ADF/PF 4/5.  Skin: Back incision with dressing c/d/i. No erythema.   Warm and dry. Psychiatric: Very pleasant. She has a normal mood and affect. Her behavior is normal. Judgment and thought content normal   Assessment/Plan: 1. Functional deficits secondary to to L4-5 stenosis with myelopathy status post decompression and fusion which require 3+ hours per day of interdisciplinary therapy in a comprehensive inpatient rehab setting. Physiatrist is providing close team supervision and 24 hour management of active medical problems listed below. Physiatrist and rehab team continue to assess barriers to discharge/monitor patient progress toward functional and medical goals.  Function:  Bathing Bathing position   Position: Shower  Bathing parts Body parts bathed by patient: Right arm, Left arm, Front perineal area, Buttocks Body parts bathed by helper: Right lower leg, Left lower leg, Back  Bathing assist Assist Level: Touching or steadying assistance(Pt > 75%)      Upper Body Dressing/Undressing Upper body dressing   What is the patient wearing?: Pull over shirt/dress, Bra Bra - Perfomed by patient: Thread/unthread right bra strap, Thread/unthread left bra strap, Hook/unhook bra (pull down sports bra)   Pull over shirt/dress - Perfomed by patient: Thread/unthread right sleeve, Thread/unthread left sleeve, Put head through opening, Pull shirt over trunk          Upper body assist Assist Level: Supervision or verbal cues      Lower Body Dressing/Undressing Lower body dressing  What is the patient wearing?: [employer], Shoes, Non-skid slipper socks Underwear - Performed by patient: Thread/unthread right underwear leg, Thread/unthread left underwear leg, Pull underwear up/down Underwear - Performed by helper: Thread/unthread right underwear leg, Thread/unthread left underwear leg Pants- Performed by patient: Thread/unthread right pants leg, Thread/unthread left pants leg, Pull pants up/down Pants- Performed by helper: Thread/unthread right pants  leg Non-skid slipper socks- Performed by patient: Don/doff right sock, Don/doff left sock Non-skid slipper socks- Performed by helper: Don/doff right sock, Don/doff left sock     Shoes - Performed by patient: Don/doff right shoe, Don/doff left shoe       TED Hose - Performed by patient: Don/doff left TED hose TED Hose - Performed by helper: Don/doff right TED hose  Lower body assist Assist for lower body dressing:  (3/8, 38%, max assist)      Toileting Toileting   Toileting steps completed by patient: Adjust clothing prior to toileting, Performs perineal hygiene, Adjust clothing after toileting   Toileting Assistive Devices: Grab bar or rail  Toileting assist Assist level: No help/no cues, More than reasonable time   Transfers Chair/bed transfer   Chair/bed transfer method: Ambulatory Chair/bed transfer assist level: No Help, no cues, assistive device, takes more than a reasonable amount of time Chair/bed transfer assistive device: Armrests, Medical sales representative     Max distance: 160 ft  Assist level: Supervision or verbal cues   Wheelchair   Type: Manual Max wheelchair distance: 160 Assist Level: Supervision or verbal cues  Cognition Comprehension Comprehension assist level: Follows complex conversation/direction with no assist  Expression Expression assist level: Expresses complex ideas: With no assist  Social Interaction Social Interaction assist level: Interacts appropriately with others - No medications needed.  Problem Solving Problem solving assist level: Solves complex problems: Recognizes & self-corrects  Memory Memory assist level: Complete Independence: No helper    Medical Problem List and Plan: 1. Lower extremity weakness and sensory deficits secondary to L4-5 stenosis with myelopathy status post decompression and fusion  Cont CIR  Will order low airloss mattress for back 2. DVT Prophylaxis/Anticoagulation: Subcutaneous Lovenox. Monitor for  any bleeding episodes. SCDs 3. Pain Management:   Neurontin 300 mg 3 times a day  OxyContin sustained release 20 mg every 12 hours d/ced on 7/6  Robaxin, Ultram, and oxycodone as needed.   Monitor with increased mobility  Cont to wean as tolerated, taking ~1/day 4. Acute blood loss anemia.   Hb 10.4 on 7/10  Iron supplement started on 7/7 5. Neuropsych: This patient is capable of making decisions on her own behalf. 6. Skin/Wound Care: Routine skin checks 7. Fluids/Electrolytes/Nutrition: Routine I&O  8.Hypertension.   Cont Lisinopril 10 mg daily  Lasix 80 mg BID.  Overall controlled  Will cont to monitor 9. Chronic renal insufficiency. Creatinine 1.27-1.42.   Cr. 1.16 on 7/10 (stable)  Cont to monitor 10. Morbid obesity. Follow-up dietary 11. Constipation. Laxative assistance 12. UTI  Ucx from 7/2 with Proteus and E.Coli  Cont keflex started on 7/4-7/11 13. Leukocytosis:   Resolved  Likely secondary to UTI  Will cont to monitor 14. LE edema - mainly in feet - chronic present for >4 years  Increased lasix to 80mg  daily, 40 every evening, increased to 80 qhs  Echo as outpt  Educated on importance of elevation  TED hose Filed Weights   01/18/16 0500 01/19/16 0500 01/20/16 0600  Weight: 127.007 kg (280 lb) 121.292 kg (267 lb 6.4 oz) 120.521 kg (265  lb 11.2 oz)   Unreliable recordings 15. Pruritis: Resolved  Likekly secondary to hygiene, encourage cleaning  Ordered Claritin PRN  16. Hypokalemia  K 3.3 on 7/10  Daily supplement initiated  LOS (Days) 8 A FACE TO FACE EVALUATION WAS PERFORMED  Ankit Lorie Phenix 01/20/2016 9:16 AM

## 2016-01-20 NOTE — Plan of Care (Signed)
Problem: RH Bed Mobility Goal: LTG Patient will perform bed mobility with assist (PT) LTG: Patient will perform bed mobility with assistance, with/without cues (PT).  Outcome: Not Met (add Reason) Cuing for log rolling

## 2016-01-20 NOTE — Discharge Summary (Signed)
Discharge summary job [id number]

## 2016-01-20 NOTE — Discharge Summary (Signed)
Caitlin Taylor, Caitlin Taylor             ACCOUNT NO.:  [id number]  MEDICAL RECORD NO.:  [id number]  LOCATION:  4M11C                        FACILITY:  [facility]  PHYSICIAN:  Delice Lesch, MD        DATE OF BIRTH:  [birth year]  DATE OF ADMISSION:  01/12/2016 DATE OF DISCHARGE:  01/21/2016                              DISCHARGE SUMMARY   DISCHARGE DIAGNOSES: 1. Lower extremity weakness and sensory deficits secondary to L4-5     stenosis with myelopathy status post decompression and fusion. 2. Subcutaneous Lovenox for DVT prophylaxis. 3. Pain management. 4. Acute blood loss anemia. 5. Hypertension. 6. Chronic renal insufficiency. 7. Morbid obesity. 8. Constipation. 9. Urinary tract infection, resolved. 10.Lower extremity edema.  HISTORY OF PRESENT ILLNESS:  This is a 45 year old right-handed female with history of morbid obesity, hypertension, chronic renal insufficiency with creatinine 1.27-1.42, back pain x2 years and right lower extremity weakness and foot drop treated with steroids.  She was admitted through the ED on January 04, 2016, with 1-2 week history of lower extremity weakness and numbness.  MRI of the spine showed severe L4-5 facet arthropathy with increasing effusions and mild widening, instability and stable facet synovial cyst.  She was evaluated by Neurosurgery, underwent L4-5 nerve root decompression and fusion on January 06, 2016, postoperative pain management, back brace when out of bed. Subcutaneous Lovenox for DVT prophylaxis.  Acute blood loss anemia 10.6 and monitored.  The patient was admitted for a comprehensive rehab program.  PAST MEDICAL HISTORY:  See discharge diagnoses.  SOCIAL HISTORY:  Lives with spouse.  Used a rolling walker after recent admission.  FUNCTIONAL STATUS UPON ADMISSION TO REHAB SERVICES:  Minimal guard 70 feet with rolling walker, min to mod assist side lying in the bed, min to mod assist for activities of daily living.  PHYSICAL EXAMINATION:   VITAL SIGNS:  Blood pressure 143/80, pulse 93, temperature 98, respirations 18. GENERAL:  This was an alert female, in no acute distress, oriented x3. LUNGS:  Clear to auscultation without wheeze. CARDIAC:  Regular rate and rhythm, no murmur. ABDOMEN:  Soft, nontender.  Good bowel sounds. BACK:  Incision clean and dry.  REHABILITATION HOSPITAL COURSE:  The patient was admitted to Inpatient Rehab Services with therapies initiated on a 3-hour daily basis, consisting of physical therapy, occupational therapy, and rehabilitation nursing.  The following issues were addressed during the patient's rehabilitation stay.  Pertaining to Caitlin Taylor's L4-5 stenosis and myelopathy, she had undergone decompression and back brace when out of bed.  She would follow up with Neurosurgery, Dr. Cyndy Freeze.  Subcutaneous Lovenox for DVT prophylaxis, no bleeding episodes.  Pain management with the use of Neurontin 300 mg t.i.d., recent OxyContin sustained release discontinued, she continued on Ultram, Robaxin, and oxycodone as needed, wean as tolerated.  Acute blood loss anemia 9.0, hemoglobin stable, monitor, continued iron supplement.  Blood pressures controlled on lisinopril and Lasix.  Monitoring for any signs of fluid overload with noted history of chronic renal insufficiency, baseline creatinine 1.27- 1.42 with latest chemistry showing a creatinine of 1.16.  Bouts of constipation resolved with laxative assistance.  She completed a course of Keflex for Proteus E-coli urinary tract infection and remaining afebrile.  The patient received weekly collaborative interdisciplinary team conferences to discuss estimated length of stay, family teaching, any barriers to her discharge.  The patient can ambulate extended household distances, multiple breaks up to 150 feet rolling walker, supervision.  Working with energy conservation techniques.  She could increase her ambulation up to 165 feet, modified independent.   Perform single step negotiation without hand rails on supervision.  She could gather her belongings for activities of daily living and homemaking for dressing, grooming, and hygiene.  She could ambulate to the ADL apartment to practice tub bench transfers with rolling walker and no assistance.  Full family teaching was completed and plan discharge to home.  DISCHARGE MEDICATIONS: 1. Ferrous sulfate 325 mg p.o. daily. 2. Lasix 80 mg b.i.d. 3. Neurontin 300 mg p.o. t.i.d. 4. Lisinopril 10 mg p.o. daily. 5. Claritin 10 mg daily as needed. 6. Robaxin 750 mg p.o. every 6 hours as needed muscle spasms. 7. Oxycodone 5-10 mg every 6 hours as needed pain. 8. Protonix 40 mg p.o. daily. 9. MiraLax daily as needed. 10. Potassium chloride 20 mg twice a day 11.Vitamin C 250 mg p.o. daily.  DIET:  Regular.  FOLLOWUP:  She would follow Dr. Cyndy Freeze, Neurosurgery, call for appointment; Dr. Delice Lesch as needed; Dr. Lanier Prude, Medical Management with consideration for possible Echocardiogram.  Back brace when out of bed.     Lauraine Rinne, P.A.   ______________________________ Delice Lesch, MD   DA/MEDQ  D:  01/20/2016  T:  01/20/2016  Job:  [id number]  cc:   Cyndy Freeze, MD Dr. Lanier Prude

## 2016-01-20 NOTE — Progress Notes (Signed)
Social Work  Discharge Note  The overall goal for the admission was met for:   Discharge location: Yes-HOME TO [place].  Length of Stay: Yes-9 DAYS  Discharge activity level: Yes-MOD/I LEVEL  Home/community participation: Yes  Services provided included: MD, RD, PT, OT, RN, CM, TR, Pharmacy and SW  Financial Services: Private Insurance: [facility]  Follow-up services arranged: Home Health: [facility], OT RN, DME: [facility] and Patient/Family has no preference for HH/DME agencies  Comments (or additional information):PT DID WELL AND REACHED MOD/I LEVEL, PLANS TO RETURN TO [place].  Patient/Family verbalized understanding of follow-up arrangements: Yes  Individual responsible for coordination of the follow-up plan: SELF & CHRIS-HUSBAND  Confirmed correct DME delivered: [name] 01/20/2016    [name]

## 2016-01-20 NOTE — Progress Notes (Signed)
Occupational Therapy Session Note  Patient Details  Name: Caitlin Taylor MRN: [id number] Date of Birth: 05/22/1971  Today's Date: 01/20/2016 OT Individual Time: 0830-0930 OT Individual Time Calculation (min): 60 min    Short Term Goals: Week 1:  OT Short Term Goal 1 (Week 1): STGs equal to LTGs set at modified independent level based on ELOS.  Skilled Therapeutic Interventions/Progress Updates:    Pt seen for skilled OT to facilitate safe functional mobility, adaptive techniques, and back precautions with ADL retraining. Pt demonstrated the ability to safely use RW to ambulate in room to gather clothing/supplies, ambulate to bathroom to transfer to toilet and tub bench.  Pt used long sponge to bathe LB and reacher to access supplies. She started to get out of tub with without donning her brace. Reminded pt of steps to don bra, shirt and brace from sitting in shower, then get up. Pt stated she just wasn't thinking. Made a written note of shower procedure steps for pt to use as a reminder. Pt was then able to complete all tasks without any cues or A, except for TED hose. Attempted the same method with bag used yesterday. Pt able to don hose with sock aid, but difficult to pull the bag out. She can try without bag at home.  Pt feels ready for discharge and is please that she is now mod I in the room.  Pt with all needs met.    Therapy Documentation Precautions:  Precautions Precautions: Fall, Back Precaution Comments: able to recall 3/3 precautions Required Braces or Orthoses: Spinal Brace Spinal Brace: Lumbar corset, Applied in sitting position Restrictions Weight Bearing Restrictions: No Vital Signs: Therapy Vitals Temp: 98.4 F (36.9 C) Temp Source: Oral Pulse Rate: 96 Resp: 18 BP: 115/69 mmHg Patient Position (if appropriate): Lying Oxygen Therapy SpO2: 99 % O2 Device: Not Delivered Pain: c/o mild back ache   ADL:   See Function Navigator for Current Functional  Status.   Therapy/Group: Individual Therapy  SAGUIER,JULIA 01/20/2016, 8:29 AM  

## 2016-01-20 NOTE — Progress Notes (Signed)
Physical Therapy Session Note  Patient Details  Name: Caitlin Taylor MRN: [id number] Date of Birth: Nov 23, 1970  Today's Date: 01/20/2016 PT Individual Time: [id number] PT Individual Time Calculation (min): 60 min   Short Term Goals: Week 1:  PT Short Term Goal 1 (Week 1): STG = LTG Due to short ELOS. Week 2:     Skilled Therapeutic Interventions/Progress Updates:  Tx focused on therex for strengthening and activity tolerance as well as HEP instruction with handout. Pt educated on safe ways to continue to increase strength and activity following DC, including walking at home, HEP x2/day and eventually returning to [facility].     Sit<>stand with Mod I and Rw, back brace. Gait in controlled setting 1x150' Mod I.   Nustep x10 min at level 4x46min and level 5 x7 min with cuies to attending to rotational movements.   Standing and seated therex with [facility] for fall risk reduction and strengthening x10 each with demonstration and cues. Pt had significant difficulty that needed modification for heel and toe raises. Pt needed 2 seated rest breaks due to fatigue. In addition to [name], pt performed standing tandem stance and marching for strengthening and functional balance.    Pt with no further questions or concerns before DC. Handoff to PT.  Therapy Documentation Precautions:  Precautions Precautions: Back Precaution Comments: able to recall 3/3 precautions Required Braces or Orthoses: Spinal Brace Spinal Brace: Lumbar corset, Applied in sitting position Restrictions Weight Bearing Restrictions: No   Pain:none       Trunk/Postural Assessment : Cervical Assessment Cervical Assessment: Within Functional Limits Thoracic Assessment Thoracic Assessment: Within Functional Limits Lumbar Assessment Lumbar Assessment: Within Functional Limits Postural Control Postural Control: Within Functional Limits  Balance: Dynamic Sitting Balance Dynamic Sitting - Level of Assistance: 7:  Independent Static Standing Balance Static Standing - Level of Assistance: 6: Modified independent (Device/Increase time) Dynamic Standing Balance Dynamic Standing - Level of Assistance: 6: Modified independent (Device/Increase time)   See Function Navigator for Current Functional Status.   Therapy/Group: Individual Therapy  Soundra Pilon 01/20/2016, 2:38 PM

## 2016-01-20 NOTE — Plan of Care (Signed)
Problem: RH Balance Goal: LTG Patient will maintain dynamic standing balance (PT) LTG: Patient will maintain dynamic standing balance with assistance during mobility activities (PT)  Outcome: Completed/Met Date Met:  01/20/16 With RW  Problem: RH Bed to Chair Transfers Goal: LTG Patient will perform bed/chair transfers w/assist (PT) LTG: Patient will perform bed/chair transfers with assistance, with/without cues (PT).  Outcome: Completed/Met Date Met:  01/20/16 With RW  Problem: RH Car Transfers Goal: LTG Patient will perform car transfers with assist (PT) LTG: Patient will perform car transfers with assistance (PT).  Outcome: Completed/Met Date Met:  01/20/16 With RW  Problem: RH Ambulation Goal: LTG Patient will ambulate in controlled environment (PT) LTG: Patient will ambulate in a controlled environment, # of feet with assistance (PT).  Outcome: Completed/Met Date Met:  01/20/16 150 ft with RW Goal: LTG Patient will ambulate in home environment (PT) LTG: Patient will ambulate in home environment, # of feet with assistance (PT).  Outcome: Completed/Met Date Met:  01/20/16 50 ft with RW Goal: LTG Patient will ambulate in community environment (PT) LTG: Patient will ambulate in community environment, # of feet with assistance (PT).  Outcome: Completed/Met Date Met:  01/20/16 150 ft with RW  Problem: RH Stairs Goal: LTG Patient will ambulate up and down stairs w/assist (PT) LTG: Patient will ambulate up and down # of stairs with assistance (PT)  Outcome: Completed/Met Date Met:  01/20/16 1 step with RW  Problem: RH Other (Specify) Goal: RH LTG Other (Specify)1 Outcome: Completed/Met Date Met:  01/20/16 Pt is able to negotiate 12 steps with B rails & Mod I for BLE strengthening.

## 2016-01-20 NOTE — Patient Care Conference (Signed)
Inpatient RehabilitationTeam Conference and Plan of Care Update Date: 01/20/2016   Time: 10:40 AM    Patient Name: Caitlin Taylor      Medical Record Number: [id number]  Date of Birth: 01/09/1971 Sex: Female         Room/Bed: 4M11C/4M11C-01 Payor Info: Payor: Theme park manager / Plan: [place] / Product Type: *No Product type* /    Admitting Diagnosis: [place]-lumber-deccmp-Fusion  Admit Date/Time:  01/12/2016  5:44 PM Admission Comments: No comment available   Primary Diagnosis:  <principal problem not specified> Principal Problem: <principal problem not specified>  Patient Active Problem List   Diagnosis Date Noted  . Benign essential HTN   . Hypokalemia   . Itching   . Swelling   . CKD (chronic kidney disease)   . Acute lower UTI   . Leukocytosis   . Lumbar myelopathy ([place]) 01/12/2016  . Low back pain   . Lumbar stenosis   . S/P lumbar fusion   . Post-operative pain   . Acute blood loss anemia   . Constipation due to pain medication   . Morbid obesity due to excess calories ([place])   . Back pain 01/05/2016  . Renal insufficiency 01/05/2016  . Hyperglycemia 01/05/2016  . Lumbar radiculopathy 01/05/2016  . UTI (urinary tract infection) 01/05/2016  . CKD (chronic kidney disease) stage 2, GFR 60-89 ml/min 01/05/2016  . Essential hypertension 01/05/2016    Expected Discharge Date: Expected Discharge Date: 01/21/16  Team Members Present: Physician leading conference: Dr. Delice Lesch Social Worker Present: Ovidio Kin, LCSW Nurse Present: Heather Roberts, RN PT Present: Lavone Nian, PT OT Present: Meriel Pica, OT     Current Status/Progress Goal Weekly Team Focus  Medical   Lower extremity weakness and sensory deficits secondary to L4-5 stenosis with myelopathy status post decompression and fusion  Improve edema, mobility, transfers, safety, electrolytes  See above   Bowel/Bladder        cont B & B     Swallow/Nutrition/ Hydration              ADL's   mod I (except for set up assist with TED hose)   modified independent level for selfcare and transfers  Goals met - pt ready for discharge   Mobility   supervision/mod I for transfers, supervision/mod I for ambulation 150 ft with RW, poor endurance & SOB with minimal activity  mod I for stair negotiation & ambulation with LRAD, mod I for transfers  pt education, d/c planning, endurance & strengthening, stair & ambulation trianing   Communication             Safety/Cognition/ Behavioral Observations            Pain        less than 3 managed with medication     Skin        surgical site-healing        *See Care Plan and progress notes for long and short-term goals.  Barriers to Discharge: Edema, HTN, CKD, hypokalemia    Possible Resolutions to Barriers:  [facility] as outpt, cont diuretics, K+ supplementation    Discharge Planning/Teaching Needs:  HOme to Mom's home more accessible, feels will be ready tomorrow. Made good gains here      Team Discussion:  Made mod/i in room today reaching goals for discharge tomorrow. SOB better and endurance better. Pain is managed, at times needs to be reminded to wear brace after showering. Medically stable for DC tomorrow.  Revisions to Treatment Plan:  DC tomorrow   Continued Need for Acute Rehabilitation Level of Care: The patient requires daily medical management by a physician with specialized training in physical medicine and rehabilitation for the following conditions: Daily direction of a multidisciplinary physical rehabilitation program to ensure safe treatment while eliciting the highest outcome that is of practical value to the patient.: Yes Daily medical management of patient stability for increased activity during participation in an intensive rehabilitation regime.: Yes Daily analysis of laboratory values and/or radiology reports with any subsequent need for medication adjustment of medical intervention for : Post  surgical problems;Neurological problems;Wound care problems  [name] 01/21/2016, 10:07 AM

## 2016-01-20 NOTE — Progress Notes (Signed)
Physical Therapy Discharge Summary  Patient Details  Name: Caitlin Taylor MRN: [id number] Date of Birth: [birth year]  Today's Date: 01/20/2016 PT Individual Time: 3845-3646 and 1516-1600 PT Individual Time Calculation (min): 30 min and 44 min   Patient has met 9 of 10 long term goals due to improved activity tolerance, improved balance, increased strength, decreased pain and improved awareness.  Patient to discharge at an ambulatory level Modified Independent.   Patient does not require a caregiver as she is discharging at Mod I level.  Reasons goals not met: n/a  Recommendation:  Patient will benefit from ongoing skilled PT services in home health setting to continue to advance safe functional mobility, address ongoing impairments in BLE weakness, poor endurance, decreased standing balance, decreased cardiorespiratory endurance, and minimize fall risk.  Equipment: RW, 3-in-1 BSC, shower bench  Reasons for discharge: treatment goals met  Patient/family agrees with progress made and goals achieved: Yes  Skilled PT treatment: Treatment 1: Pt received in chair & agreeable to PT, noting 4-5/10 low back ache but premedicated. Pt able to complete all transfers with supervision, ambulate 165 ft + 100 + 120 ft with RW & mod I and negotiate 12 steps (6") with R ascending rail & mod I. Pt continues to demonstrate trendelenburg gait, decreased step length BLE, & decreased gait speed. Pt able to negotiate stairs laterally while maintaining back precautions but throughout all activity pt required cuing for pursed lip breathing; pt reported task is beginning to feel more natural. Pt independently recalled 3/3 back precautions. Pt able to complete car transfer from sedan simulated height with Mod I. At end of session pt left sitting in chair in room with all needs within reach.   Treatment 2: Pt received in handoff from PT & agreeable to session; pt denied c/o pain. Pt able to complete all transfers  with mod I, retrieve object from floor with mod I & reacher & ambulate with RW & Mod I. Gait training x 100 ft + 100 ft + 100 ft. Pt able to demonstrate bed mobility in apartment with supervision, requiring cuing to bend knees up for log rolling. Pt able to negotiate single curb with RW & mod I to simulate home entry. Discussed need for pt to remove all throw rugs & objects in walkway at her home & pt voiced understanding. Encouraged pt to perform HEP & ambulate as much as possible throughout the day to increase endurance. Educated pt to not ambulate outdoors, as pt reports her yard has hills, until HHPT clears her to do so. At end of session pt left sitting in chair in room with all needs within reach.   PT Discharge Precautions/Restrictions Precautions Precautions: Back Required Braces or Orthoses: Spinal Brace Spinal Brace: Lumbar corset;Applied in sitting position Restrictions Weight Bearing Restrictions: No  Vital Signs Therapy Vitals Pulse Rate: (!) 127 (after ambulating) BP: 121/70 mmHg Oxygen Therapy SpO2: 100 % O2 Device: Not Delivered  Pain Pain Assessment Pain Score: 5  Pain Location: Back Pain Descriptors / Indicators: Aching Pain Intervention(s):  (premedicated)  Vision/Perception  Pt reports wearing glasses at all times; pt denies changes in vision since admission to CIR.   Cognition Overall Cognitive Status: Within Functional Limits for tasks assessed Orientation Level: Oriented X4;Oriented to person;Oriented to place;Oriented to situation;Oriented to time Attention: Focused Memory: Appears intact Awareness: Appears intact Problem Solving: Appears intact Safety/Judgment: Appears intact   Sensation Sensation Light Touch: Appears Intact (BLE) Stereognosis: Appears Intact Hot/Cold: Appears Intact Proprioception: Appears Intact (BLE)  Additional Comments: Senation intact in BUEs Coordination Gross Motor Movements are Fluid and Coordinated: Yes Fine Motor  Movements are Fluid and Coordinated: Yes   Mobility Bed Mobility Bed Mobility: Rolling Right;Rolling Left;Supine to Sit;Sit to Supine (apartment bed) Rolling Right: 5: Supervision Rolling Left: 5: Supervision Supine to Sit: 5: Supervision Sit to Supine: 5: Supervision Transfers Transfers: Yes Sit to Stand: 6: Modified independent (Device/Increase time) Stand to Sit: 6: Modified independent (Device/Increase time) Stand Pivot Transfers: 6: Modified independent (Device/Increase time) (with RW)  Locomotion  Ambulation Ambulation: Yes Ambulation/Gait Assistance: 6: Modified independent (Device/Increase time) Ambulation Distance (Feet): 165 Feet Assistive device: Rolling walker Gait Gait: Yes Gait Pattern: Decreased step length - left;Decreased step length - right (decreased gait speed, trendelenburg gait) Stairs / Additional Locomotion Stairs: Yes Stairs Assistance: 6: Modified independent (Device/Increase time) Stair Management Technique: One rail Right Number of Stairs: 12 Height of Stairs: 6 (inches) Ramp: 6: Modified independent (Device)   Balance Dynamic Sitting Balance Dynamic Sitting - Level of Assistance: 6: Modified independent (Device/Increase time) Static Standing Balance Static Standing - Balance Support: Bilateral upper extremity supported Static Standing - Level of Assistance: 6: Modified independent (Device/Increase time) Dynamic Standing Balance Dynamic Standing - Balance Support: Bilateral upper extremity supported Dynamic Standing - Level of Assistance: 6: Modified independent (Device/Increase time)   Extremity Assessment  RLE Assessment RLE Assessment: Within Functional Limits RLE AROM (degrees) Overall AROM Right Lower Extremity: Within functional limits for tasks assessed RLE Strength Right Hip Flexion: 4-/5 Right Knee Flexion: 3+/5 Right Knee Extension: 4+/5 Right Ankle Dorsiflexion: 3/5 (numbness & tingling with movement) LLE Assessment LLE  Assessment: Within Functional Limits LLE AROM (degrees) Overall AROM Left Lower Extremity: Within functional limits for tasks assessed LLE Strength Left Hip Flexion: 4-/5 Left Knee Flexion: 4/5 Left Knee Extension: 4+/5 Left Ankle Dorsiflexion: 4-/5 (denies numbness & tingling with movement)  Pt with significant edema in BLE.  See Function Navigator for Current Functional Status.  Waunita Schooner 01/20/2016, 7:47 AM

## 2016-01-20 NOTE — Progress Notes (Signed)
Social Work Patient ID: Caitlin Taylor, female   DOB: 05/03/1971, 45 y.o.   MRN: [id number] Met with pt to discuss discharge needs, agreeable to equipment and follow up therapies, no pref. Will make referrals today to prepare for discharge tomorrow.

## 2016-01-20 NOTE — Progress Notes (Signed)
Occupational Therapy Discharge Summary  Patient Details  Name: Caitlin Taylor MRN: [id number] Date of Birth: Mar 16, 1971   Patient has met 12 of 12 long term goals due to improved activity tolerance, improved balance and ability to compensate for deficits.  Patient to discharge at overall Modified Independent level.  Patient's care partner is independent to provide the necessary physical assistance at discharge.    Reasons goals not met: n/a  Recommendation:  Patient will benefit from ongoing skilled OT services in home health setting to continue to advance functional skills in the area of BADL and iADL.  Equipment: BSC and tub bench  Reasons for discharge: treatment goals met  Patient/family agrees with progress made and goals achieved: Yes  OT Discharge Precautions/Restrictions  Precautions Precautions: Back Required Braces or Orthoses: Spinal Brace Spinal Brace: Lumbar corset;Applied in sitting position Restrictions Weight Bearing Restrictions: No ADL ADL ADL Comments: mod I with basic self care using DME and AE; mod I with very basic simple homemaking, meal prep and folding laundry Vision/Perception  Vision- History Baseline Vision/History: Wears glasses Wears Glasses: At all times Patient Visual Report: No change from baseline Vision- Assessment Vision Assessment?: No apparent visual deficits  Cognition Overall Cognitive Status: Within Functional Limits for tasks assessed Sensation Sensation Light Touch: Appears Intact Stereognosis: Appears Intact Hot/Cold: Appears Intact Proprioception: Appears Intact Additional Comments: Senation intact in BUEs Coordination Gross Motor Movements are Fluid and Coordinated: Yes Fine Motor Movements are Fluid and Coordinated: Yes Motor  Motor Motor - Discharge Observations: WFL for basic ADLs and mobiility with RW Mobility     Trunk/Postural Assessment  Cervical Assessment Cervical Assessment: Within Functional  Limits Thoracic Assessment Thoracic Assessment: Within Functional Limits Lumbar Assessment Lumbar Assessment: Within Functional Limits Postural Control Postural Control: Within Functional Limits  Balance Dynamic Sitting Balance Dynamic Sitting - Level of Assistance: 7: Independent Static Standing Balance Static Standing - Level of Assistance: 6: Modified independent (Device/Increase time) Dynamic Standing Balance Dynamic Standing - Level of Assistance: 6: Modified independent (Device/Increase time) Extremity/Trunk Assessment RUE Assessment RUE Assessment: Within Functional Limits LUE Assessment LUE Assessment: Within Functional Limits   See Function Navigator for Current Functional Status.  [place] 01/20/2016, 12:40 PM

## 2016-01-20 NOTE — Discharge Instructions (Signed)
Inpatient Rehab Discharge Instructions  Caitlin Taylor Discharge date and time: No discharge date for patient encounter.   Activities/Precautions/ Functional Status: Activity: Back brace when out of bed applied in sitting position Diet: regular diet Wound Care: keep wound clean and dry Functional status:  ___ No restrictions     ___ Walk up steps independently ___ 24/7 supervision/assistance   ___ Walk up steps with assistance ___ Intermittent supervision/assistance  ___ Bathe/dress independently ___ Walk with walker     _x__ Bathe/dress with assistance ___ Walk Independently    ___ Shower independently ___ Walk with assistance    ___ Shower with assistance ___ No alcohol     ___ Return to work/school ________  Special Instructions:    COMMUNITY REFERRALS UPON DISCHARGE:    Home Health:   PT, OT, RN   Agency:[facility] [place]   Date of last service:01/21/2016  Medical Equipment/Items Ordered:[facility] [place], [facility] [place]   [phone]  My questions have been answered and I understand these instructions. I will adhere to these goals and the provided educational materials after my discharge from the hospital.  Patient/Caregiver Signature _______________________________ Date __________  Clinician Signature _______________________________________ Date __________  Please bring this form and your medication list with you to all your follow-up doctor's appointments.

## 2016-01-21 MED ORDER — GABAPENTIN 300 MG PO CAPS: 300.0000 mg | ORAL_CAPSULE | Freq: Three times a day (TID) | ORAL | Status: DC

## 2016-01-21 MED ORDER — LISINOPRIL 10 MG PO TABS: 10.0000 mg | ORAL_TABLET | Freq: Every day | ORAL | Status: DC

## 2016-01-21 MED ORDER — OXYCODONE HCL 5 MG PO TABS
5.0000 mg | ORAL_TABLET | Freq: Four times a day (QID) | ORAL | Status: DC | PRN
Start: 2016-01-21 — End: 2016-09-04

## 2016-01-21 MED ORDER — METHOCARBAMOL 750 MG PO TABS
750.0000 mg | ORAL_TABLET | Freq: Four times a day (QID) | ORAL | Status: DC | PRN
Start: 2016-01-21 — End: 2016-09-04

## 2016-01-21 MED ORDER — PANTOPRAZOLE SODIUM 40 MG PO TBEC: 40.0000 mg | DELAYED_RELEASE_TABLET | Freq: Every day | ORAL | Status: DC

## 2016-01-21 MED ORDER — ASCORBIC ACID 250 MG PO TABS: 250.0000 mg | ORAL_TABLET | Freq: Every day | ORAL | Status: DC

## 2016-01-21 MED ORDER — FERROUS SULFATE 325 (65 FE) MG PO TABS: 325.0000 mg | ORAL_TABLET | Freq: Every day | ORAL | Status: DC

## 2016-01-21 MED ORDER — POTASSIUM CHLORIDE CRYS ER 20 MEQ PO TBCR: 20.0000 meq | EXTENDED_RELEASE_TABLET | Freq: Two times a day (BID) | ORAL | Status: DC

## 2016-01-21 MED ORDER — FUROSEMIDE 80 MG PO TABS: 80.0000 mg | ORAL_TABLET | Freq: Two times a day (BID) | ORAL | Status: DC

## 2016-01-21 NOTE — Progress Notes (Signed)
[facility]IT[facility]     PROGRESS NOTE  Subjective/Complaints:  Pt sitting up at the edge of his bed. She states she is doing great and ready for discharge.   ROS: +B/l Foot edema. Denies CP, SOB, N/V/D.  Objective: Vital Signs: Blood pressure 163/96, pulse 104, temperature 98.1 F (36.7 C), temperature source Oral, resp. rate 18, height 5\' 3"  (1.6 m), weight 119.75 kg (264 lb), last menstrual period 12/28/2015, SpO2 97 %. No results found.  Recent Labs  01/19/16 1128  WBC 9.6  HGB 10.4*  HCT 32.0*  PLT 282    Recent Labs  01/19/16 0037 01/19/16 1128  NA  --  140  K  --  3.3*  CL  --  103  GLUCOSE  --  92  BUN  --  14  CREATININE 1.19* 1.16*  CALCIUM  --  9.0   CBG (last 3)  No results for input(s): GLUCAP in the last 72 hours.  Wt Readings from Last 3 Encounters:  01/21/16 119.75 kg (264 lb)  01/05/16 122.108 kg (269 lb 3.2 oz)    Physical Exam:  BP 163/96 mmHg  Pulse 104  Temp(Src) 98.1 F (36.7 C) (Oral)  Resp 18  Ht 5\' 3"  (1.6 m)  Wt 119.75 kg (264 lb)  BMI 46.78 kg/m2  SpO2 97%  LMP 12/28/2015 Constitutional: She appears well-developed. Obese. NAD  HENT: Normocephalic. Atraumatic.  Eyes: Conjunctivae and EOM are normal.  Cardiovascular: Normal rate and regular rhythm.No murmur heard. Respiratory: Effort normal and breath sounds normal. No stridor. No respiratory distress. She has no wheezes.  GI: Soft. Bowel sounds are normal. She exhibits no distension. There is no tenderness.  Musculoskeletal: She exhibits edema in b/l feet.  Neurological: She is alert and oriented.   Speech clear.  Follows commands without difficulty.  Motor: B/l UE 4+/5 proximal to distal LLE: Hip flexion 4+/5, knee extension 4+/5, ankle dorsi/plantar flexion 4+/5 RLE: HF 4/5, KE 4+/5, ADF/PF 4/5.  Skin: Back incision with dressing c/d/i, almost healed. Warm and dry. Psychiatric: Very pleasant. She has a normal mood and affect. Her behavior  is normal. Judgment and thought content normal   Assessment/Plan: 1. Functional deficits secondary to to L4-5 stenosis with myelopathy status post decompression and fusion which require 3+ hours per day of interdisciplinary therapy in a comprehensive inpatient rehab setting. Physiatrist is providing close team supervision and 24 hour management of active medical problems listed below. Physiatrist and rehab team continue to assess barriers to discharge/monitor patient progress toward functional and medical goals.  Function:  Bathing Bathing position   Position: Shower  Bathing parts Body parts bathed by patient: Right arm, Left arm, Front perineal area, Buttocks, Chest, Abdomen, Right upper leg, Left upper leg, Right lower leg, Left lower leg, Back Body parts bathed by helper: Right lower leg, Left lower leg, Back  Bathing assist Assist Level: Assistive device      Upper Body Dressing/Undressing Upper body dressing   What is the patient wearing?: Pull over shirt/dress, Bra, Orthosis Bra - Perfomed by patient: Thread/unthread right bra strap, Thread/unthread left bra strap, Hook/unhook bra (pull down sports bra)   Pull over shirt/dress - Perfomed by patient: Thread/unthread right sleeve, Thread/unthread left sleeve, Put head through opening, Pull shirt over trunk       Orthosis activity level: Performed by patient  Upper body assist Assist Level: No help, No cues      Lower Body Dressing/Undressing Lower body dressing   What is  the patient wearing?: [employer], Shoes, Underwear, Pants Underwear - Performed by patient: Thread/unthread right underwear leg, Thread/unthread left underwear leg, Pull underwear up/down Underwear - Performed by helper: Thread/unthread right underwear leg, Thread/unthread left underwear leg Pants- Performed by patient: Thread/unthread right pants leg, Thread/unthread left pants leg, Pull pants up/down Pants- Performed by helper: Thread/unthread right pants  leg Non-skid slipper socks- Performed by patient: Don/doff right sock, Don/doff left sock Non-skid slipper socks- Performed by helper: Don/doff right sock, Don/doff left sock     Shoes - Performed by patient: Don/doff right shoe, Don/doff left shoe       TED Hose - Performed by patient: Don/doff left TED hose TED Hose - Performed by helper: Don/doff right TED hose, Don/doff left TED hose  Lower body assist Assist for lower body dressing: Set up   Set up : Don/doff TED stockings  Toileting Toileting   Toileting steps completed by patient: Adjust clothing prior to toileting, Performs perineal hygiene, Adjust clothing after toileting   Toileting Assistive Devices: Grab bar or rail  Toileting assist Assist level: More than reasonable time   Transfers Chair/bed transfer   Chair/bed transfer method: Ambulatory Chair/bed transfer assist level: No Help, no cues, assistive device, takes more than a reasonable amount of time Chair/bed transfer assistive device: Medical sales representative     Max distance: 100 ft Assist level: No help, No cues, assistive device, takes more than a reasonable amount of time   Wheelchair Wheelchair activity did not occur: N/A Type: Manual Max wheelchair distance: 160 Assist Level: Supervision or verbal cues  Cognition Comprehension Comprehension assist level: Follows complex conversation/direction with no assist  Expression Expression assist level: Expresses complex ideas: With no assist  Social Interaction Social Interaction assist level: Interacts appropriately with others - No medications needed.  Problem Solving Problem solving assist level: Solves complex problems: Recognizes & self-corrects  Memory Memory assist level: Complete Independence: No helper    Medical Problem List and Plan: 1. Lower extremity weakness and sensory deficits secondary to L4-5 stenosis with myelopathy status post decompression and fusion  D/c today  Ordered low  airloss mattress for back (as inpatient) 2. DVT Prophylaxis/Anticoagulation: Subcutaneous Lovenox. Monitor for any bleeding episodes. SCDs 3. Pain Management:   Neurontin 300 mg 3 times a day  OxyContin sustained release 20 mg every 12 hours d/ced on 7/6  Robaxin, Ultram, and oxycodone as needed.   Monitor with increased mobility  Cont to wean as tolerated, taking ~1/day 4. Acute blood loss anemia.   Hb 10.4 on 7/10  Iron supplement started on 7/7 5. Neuropsych: This patient is capable of making decisions on her own behalf. 6. Skin/Wound Care: Routine skin checks 7. Fluids/Electrolytes/Nutrition: Routine I&O  8.Hypertension.   Cont Lisinopril 10 mg daily  Lasix 80 mg BID.  Overall controlled  Will cont to monitor 9. Chronic renal insufficiency. Creatinine 1.27-1.42.   Cr. 1.16 on 7/10 (stable)  Cont to monitor 10. Morbid obesity. Follow-up dietary 11. Constipation. Laxative assistance 12. UTI  Ucx from 7/2 with Proteus and E.Coli  Completed keflex course 7/4-7/11 13. Leukocytosis:   Resolved  Likely secondary to UTI  Will cont to monitor 14. LE edema - mainly in feet - chronic present for >4 years  Increased lasix to 80mg  daily, 40 every evening, increased to 80 qhs  Echo as outpt  Educated on importance of elevation  TED hose Filed Weights   01/19/16 0500 01/20/16 0600 01/21/16 0559  Weight: 121.292 kg (  267 lb 6.4 oz) 120.521 kg (265 lb 11.2 oz) 119.75 kg (264 lb)   Relatively stable  15. Pruritis: Resolved  Likekly secondary to hygiene, encourage cleaning  Ordered Claritin PRN  16. Hypokalemia  K 3.3 on 7/10  Daily supplement initiated  LOS (Days) 9 A FACE TO FACE EVALUATION WAS PERFORMED  Flemon Kelty Lorie Phenix 01/21/2016 8:36 AM

## 2016-01-21 NOTE — Progress Notes (Signed)
Pt discharged to home with husband. Discharge instructions given and pt has all belongings .

## 2016-02-12 DIAGNOSIS — Z87440 Personal history of urinary (tract) infections: Secondary | ICD-10-CM

## 2016-02-12 DIAGNOSIS — Z4789 Encounter for other orthopedic aftercare: Secondary | ICD-10-CM | POA: Diagnosis not present

## 2016-02-12 DIAGNOSIS — N189 Chronic kidney disease, unspecified: Secondary | ICD-10-CM | POA: Diagnosis not present

## 2016-02-12 DIAGNOSIS — I129 Hypertensive chronic kidney disease with stage 1 through stage 4 chronic kidney disease, or unspecified chronic kidney disease: Secondary | ICD-10-CM | POA: Diagnosis not present

## 2016-07-27 ENCOUNTER — Other Ambulatory Visit: Payer: Self-pay | Admitting: Internal Medicine

## 2016-07-27 DIAGNOSIS — E876 Hypokalemia: Secondary | ICD-10-CM

## 2016-08-02 ENCOUNTER — Other Ambulatory Visit: Payer: 59

## 2016-08-03 ENCOUNTER — Other Ambulatory Visit: Payer: Self-pay | Admitting: Internal Medicine

## 2016-08-03 DIAGNOSIS — E876 Hypokalemia: Secondary | ICD-10-CM

## 2016-08-04 ENCOUNTER — Ambulatory Visit
Admission: RE | Admit: 2016-08-04 | Discharge: 2016-08-04 | Disposition: A | Discharge status: Home / Self Care | Payer: 59 | Source: Ambulatory Visit | Attending: Internal Medicine | Admitting: Internal Medicine

## 2016-08-04 DIAGNOSIS — E876 Hypokalemia: Secondary | ICD-10-CM

## 2016-09-03 ENCOUNTER — Inpatient Hospital Stay (HOSPITAL_BASED_OUTPATIENT_CLINIC_OR_DEPARTMENT_OTHER)
Admission: EM | Admit: 2016-09-03 | Discharge: 2016-09-06 | DRG: 175 | Disposition: A | Discharge status: Home / Self Care | Payer: 59 | Attending: Nephrology | Admitting: Nephrology

## 2016-09-03 ENCOUNTER — Encounter (HOSPITAL_BASED_OUTPATIENT_CLINIC_OR_DEPARTMENT_OTHER): Payer: Self-pay | Admitting: Respiratory Therapy

## 2016-09-03 ENCOUNTER — Emergency Department (HOSPITAL_BASED_OUTPATIENT_CLINIC_OR_DEPARTMENT_OTHER): Payer: 59

## 2016-09-03 DIAGNOSIS — N182 Chronic kidney disease, stage 2 (mild): Secondary | ICD-10-CM | POA: Diagnosis present

## 2016-09-03 DIAGNOSIS — Z86711 Personal history of pulmonary embolism: Secondary | ICD-10-CM | POA: Diagnosis present

## 2016-09-03 DIAGNOSIS — I824Y1 Acute embolism and thrombosis of unspecified deep veins of right proximal lower extremity: Secondary | ICD-10-CM

## 2016-09-03 DIAGNOSIS — G8929 Other chronic pain: Secondary | ICD-10-CM | POA: Diagnosis present

## 2016-09-03 DIAGNOSIS — I130 Hypertensive heart and chronic kidney disease with heart failure and stage 1 through stage 4 chronic kidney disease, or unspecified chronic kidney disease: Secondary | ICD-10-CM | POA: Diagnosis present

## 2016-09-03 DIAGNOSIS — B9620 Unspecified Escherichia coli [E. coli] as the cause of diseases classified elsewhere: Secondary | ICD-10-CM | POA: Diagnosis present

## 2016-09-03 DIAGNOSIS — N179 Acute kidney failure, unspecified: Secondary | ICD-10-CM | POA: Diagnosis present

## 2016-09-03 DIAGNOSIS — M549 Dorsalgia, unspecified: Secondary | ICD-10-CM | POA: Diagnosis present

## 2016-09-03 DIAGNOSIS — R601 Generalized edema: Secondary | ICD-10-CM | POA: Diagnosis not present

## 2016-09-03 DIAGNOSIS — R001 Bradycardia, unspecified: Secondary | ICD-10-CM | POA: Diagnosis not present

## 2016-09-03 DIAGNOSIS — I10 Essential (primary) hypertension: Secondary | ICD-10-CM | POA: Diagnosis present

## 2016-09-03 DIAGNOSIS — D3502 Benign neoplasm of left adrenal gland: Secondary | ICD-10-CM | POA: Diagnosis present

## 2016-09-03 DIAGNOSIS — R000 Tachycardia, unspecified: Secondary | ICD-10-CM | POA: Diagnosis present

## 2016-09-03 DIAGNOSIS — Z6841 Body Mass Index (BMI) 40.0 and over, adult: Secondary | ICD-10-CM | POA: Diagnosis not present

## 2016-09-03 DIAGNOSIS — N3000 Acute cystitis without hematuria: Secondary | ICD-10-CM | POA: Diagnosis present

## 2016-09-03 DIAGNOSIS — Z23 Encounter for immunization: Secondary | ICD-10-CM

## 2016-09-03 DIAGNOSIS — R0602 Shortness of breath: Secondary | ICD-10-CM | POA: Diagnosis not present

## 2016-09-03 DIAGNOSIS — Z8249 Family history of ischemic heart disease and other diseases of the circulatory system: Secondary | ICD-10-CM | POA: Diagnosis not present

## 2016-09-03 DIAGNOSIS — N049 Nephrotic syndrome with unspecified morphologic changes: Secondary | ICD-10-CM | POA: Diagnosis present

## 2016-09-03 DIAGNOSIS — I2699 Other pulmonary embolism without acute cor pulmonale: Secondary | ICD-10-CM | POA: Diagnosis not present

## 2016-09-03 DIAGNOSIS — I2609 Other pulmonary embolism with acute cor pulmonale: Principal | ICD-10-CM | POA: Diagnosis present

## 2016-09-03 DIAGNOSIS — R0902 Hypoxemia: Secondary | ICD-10-CM | POA: Diagnosis present

## 2016-09-03 DIAGNOSIS — N390 Urinary tract infection, site not specified: Secondary | ICD-10-CM | POA: Diagnosis not present

## 2016-09-03 DIAGNOSIS — Z79899 Other long term (current) drug therapy: Secondary | ICD-10-CM

## 2016-09-03 DIAGNOSIS — E041 Nontoxic single thyroid nodule: Secondary | ICD-10-CM | POA: Diagnosis present

## 2016-09-03 DIAGNOSIS — R609 Edema, unspecified: Secondary | ICD-10-CM | POA: Diagnosis present

## 2016-09-03 HISTORY — DX: Other pulmonary embolism without acute cor pulmonale: I26.99

## 2016-09-03 LAB — URINALYSIS, ROUTINE W REFLEX MICROSCOPIC
BILIRUBIN URINE: NEGATIVE
GLUCOSE, UA: NEGATIVE mg/dL
KETONES UR: NEGATIVE mg/dL
Nitrite: POSITIVE — AB
PROTEIN: 30 mg/dL — AB
Specific Gravity, Urine: 1.016 (ref 1.005–1.030)
pH: 5.5 (ref 5.0–8.0)

## 2016-09-03 LAB — CBC WITH DIFFERENTIAL/PLATELET
BAND NEUTROPHILS: 0 %
BASOS ABS: 0 10*3/uL (ref 0.0–0.1)
Basophils Relative: 0 %
Blasts: 0 %
Eosinophils Absolute: 0 10*3/uL (ref 0.0–0.7)
Eosinophils Relative: 0 %
HCT: 35.9 % — ABNORMAL LOW (ref 36.0–46.0)
Hemoglobin: 12.2 g/dL (ref 12.0–15.0)
LYMPHS ABS: 0.9 10*3/uL (ref 0.7–4.0)
Lymphocytes Relative: 8 %
MCH: 30.6 pg (ref 26.0–34.0)
MCHC: 34 g/dL (ref 30.0–36.0)
MCV: 90 fL (ref 78.0–100.0)
METAMYELOCYTES PCT: 2 %
MONO ABS: 0.7 10*3/uL (ref 0.1–1.0)
MONOS PCT: 6 %
Myelocytes: 2 %
Neutro Abs: 10.1 10*3/uL — ABNORMAL HIGH (ref 1.7–7.7)
Neutrophils Relative %: 82 %
PLATELETS: 158 10*3/uL (ref 150–400)
Promyelocytes Absolute: 0 %
RBC: 3.99 MIL/uL (ref 3.87–5.11)
RDW: 13.5 % (ref 11.5–15.5)
WBC: 11.7 10*3/uL — ABNORMAL HIGH (ref 4.0–10.5)
nRBC: 0 /100 WBC

## 2016-09-03 LAB — BASIC METABOLIC PANEL
ANION GAP: 11 (ref 5–15)
BUN: 26 mg/dL — ABNORMAL HIGH (ref 6–20)
CALCIUM: 8.7 mg/dL — AB (ref 8.9–10.3)
CO2: 25 mmol/L (ref 22–32)
CREATININE: 1.53 mg/dL — AB (ref 0.44–1.00)
Chloride: 104 mmol/L (ref 101–111)
GFR calc Af Amer: 46 mL/min — ABNORMAL LOW (ref >60)
GFR, EST NON AFRICAN AMERICAN: 40 mL/min — AB (ref >60)
GLUCOSE: 148 mg/dL — AB (ref 65–99)
Potassium: 3.4 mmol/L — ABNORMAL LOW (ref 3.5–5.1)
Sodium: 140 mmol/L (ref 135–145)

## 2016-09-03 LAB — D-DIMER, QUANTITATIVE: D-Dimer, Quant: 8.64 ug/mL-FEU — ABNORMAL HIGH (ref 0.00–0.50)

## 2016-09-03 LAB — PREGNANCY, URINE: Preg Test, Ur: NEGATIVE

## 2016-09-03 LAB — URINALYSIS, MICROSCOPIC (REFLEX)

## 2016-09-03 LAB — BRAIN NATRIURETIC PEPTIDE: B NATRIURETIC PEPTIDE 5: 51.8 pg/mL (ref 0.0–100.0)

## 2016-09-03 LAB — TROPONIN I

## 2016-09-03 MED ORDER — HEPARIN BOLUS VIA INFUSION
5000.0000 [IU] | Freq: Once | INTRAVENOUS | Status: AC
  Administered 2016-09-03: 5000 [IU] via INTRAVENOUS

## 2016-09-03 MED ORDER — IOPAMIDOL (ISOVUE-370) INJECTION 76%
100.0000 mL | Freq: Once | INTRAVENOUS | Status: AC | PRN
  Administered 2016-09-03: 100 mL via INTRAVENOUS

## 2016-09-03 MED ORDER — HEPARIN (PORCINE) IN NACL 100-0.45 UNIT/ML-% IJ SOLN
1200.0000 [IU]/h | INTRAMUSCULAR | Status: DC
  Administered 2016-09-03 – 2016-09-04 (×2): 1350 [IU]/h via INTRAVENOUS
  Administered 2016-09-05: 1200 [IU]/h via INTRAVENOUS
  Filled 2016-09-03 (×3): qty 250

## 2016-09-03 MED ORDER — DEXTROSE 5 % IV SOLN
1.0000 g | Freq: Once | INTRAVENOUS | Status: AC
  Administered 2016-09-03: 1 g via INTRAVENOUS
  Filled 2016-09-03: qty 10

## 2016-09-03 NOTE — ED Notes (Signed)
Pt ambulated to restroom and returned with SOB, labored with o2 sats 82% RA, pulse 132; pt returned to bed and sats increased to 91% RA pulse 108

## 2016-09-03 NOTE — Progress Notes (Signed)
ANTICOAGULATION CONSULT NOTE - Initial Consult  Pharmacy Consult for Heparin Indication: r/o pulmonary embolus   No Known Allergies  Patient Measurements: Height: 5' 3.5" (161.3 cm) Weight: 260 lb (117.9 kg) IBW/kg (Calculated) : 53.55 Heparin Dosing Weight: 83 kg  Vital Signs: Temp: 98.6 F (37 C) (02/23 2012) Temp Source: Oral (02/23 2012) BP: 126/78 (02/23 2252) Pulse Rate: 91 (02/23 2252)  Labs:  Recent Labs  09/03/16 2050  HGB 12.2  HCT 35.9*  PLT 158  CREATININE 1.53*    Estimated Creatinine Clearance: 58.1 mL/min (by C-G formula based on SCr of 1.53 mg/dL (H)).   Medical History: Past Medical History:  Diagnosis Date  . Chronic back pain   . Hypertension     Medications:  Awaiting electronic med rec  Assessment: 46 y.o. F presents with SOB. To begin heparin for r/o PE. No AC PTA. CBC ok on admission.  Goal of Therapy:  Heparin level 0.3-0.7 units/ml Monitor platelets by anticoagulation protocol: Yes   Plan:  Heparin IV bolus 5000 units Heparin gtt at 1350 units/hr Will f/u heparin level in 6 hours Daily heparin level and CBC  Sherlon Handing, PharmD, BCPS Clinical pharmacist, pager [phone] 09/03/2016,10:55 PM

## 2016-09-03 NOTE — Plan of Care (Addendum)
46 yo female with worsening chronic lower extremities edema who presented to [facility] with anasarca and hypoxia. Her D-dimer was elevated at 8.64, BNP was normal. Two view CxR is normal. Troponin is pending. CT angiogram of the chest showed B/L pulmonary embolism. Started empirically on continuous heparin infusion. She also has U/A with pyuria and bacteriuria. Accepted to stepdown/inpatient. She will need  echocardiogram and lower extremities venous scan to R/o DVT. See bellow for further detail. ________________________________________________________________________ Triage nurse's statement.  Pt c/o swelling to feet for years, states swelling to face and "everywhere" with SOB x6wks, states SOB on exertion, no distress, speaking in complete sentences  _______________________________________________________________________  Per ED provider. Chief Complaint  Patient presents with  . Shortness of Breath   HPI Caitlin Taylor is a 46 y.o. female with a history of hypertension and stage 2 CKD who presents to the Emergency Department complaining of gradually worsening shortness of breath onset one month ago. Per pt, SOB is worse at night and with exertion. No alleviating factors noted. Pt notes associated wheezing, fatigue, nonproductive cough, and burning central chest pain. Pt also reports swelling to her bilateral feet and legs "for years", but now reports swelling to her face and abdomen. She denies and chest pain or discomfort upon exam, abdominal pain, or any other associated symptoms      D-dimer, quantitative (not at [facility]) [id number] (Abnormal) Collected: 09/03/16 2050  Updated: 09/03/16 2202   Specimen Type: Blood   Specimen Source: Vein    D-Dimer, Quant 8.64 (H) ug/mL-FEU  CBC with Differential [id number] (Abnormal) Collected: 09/03/16 2050  Updated: 09/03/16 2145   Specimen Type: Blood   Specimen Source: Vein    WBC 11.7 (H) K/uL   RBC 3.99 MIL/uL   Hemoglobin 12.2 g/dL   HCT  35.9 (L) %   MCV 90.0 fL   MCH 30.6 pg   MCHC 34.0 g/dL   RDW 13.5 %   Platelets 158 K/uL   Neutrophils Relative % 82 %   Lymphocytes Relative 8 %   Monocytes Relative 6 %   Eosinophils Relative 0 %   Basophils Relative 0 %   Band Neutrophils 0 %   Metamyelocytes Relative 2 %   Myelocytes 2 %   Promyelocytes Absolute 0 %   Blasts 0 %   nRBC 0 /100 WBC   Neutro Abs 10.1 (H) K/uL   Lymphs Abs 0.9 K/uL   Monocytes Absolute 0.7 K/uL   Eosinophils Absolute 0.0 K/uL   Basophils Absolute 0.0 K/uL   WBC Morphology MILD LEFT SHIFT (1-5% METAS, OCC MYELO, OCC BANDS)  Brain natriuretic peptide [id number] Collected: 09/03/16 2052  Updated: 09/03/16 2145   Specimen Type: Blood   Specimen Source: Vein    B Natriuretic Peptide 51.8 pg/mL  Basic metabolic panel [id number] (Abnormal) Collected: 09/03/16 2050  Updated: 09/03/16 2134   Specimen Type: Blood   Specimen Source: Vein    Sodium 140 mmol/L   Potassium 3.4 (L) mmol/L   Chloride 104 mmol/L   CO2 25 mmol/L   Glucose, Bld 148 (H) mg/dL   BUN 26 (H) mg/dL   Creatinine, Ser 1.53 (H) mg/dL   Calcium 8.7 (L) mg/dL   GFR calc non Af Amer 40 (L) mL/min   GFR calc Af Amer 46 (L) mL/min   Anion gap 11  Urinalysis, Microscopic (reflex) [id number] (Abnormal) Collected: 09/03/16 2052  Updated: 09/03/16 2128    RBC / HPF 6-30 RBC/hpf   WBC, UA TOO NUMEROUS TO COUNT WBC/hpf  Bacteria, UA MANY (A)   Squamous Epithelial / LPF 6-30 (A)  Urinalysis, Routine w reflex microscopic [id number] (Abnormal) Collected: 09/03/16 2052  Updated: 09/03/16 2128   Specimen Type: Urine   Specimen Source: Urine, Clean Catch    Color, Urine YELLOW   APPearance TURBID (A)   Specific Gravity, Urine 1.016   pH 5.5   Glucose, UA NEGATIVE mg/dL   Hgb urine dipstick LARGE (A)   Bilirubin Urine NEGATIVE   Ketones, ur NEGATIVE mg/dL   Protein, ur 30 (A) mg/dL   Nitrite POSITIVE (A)   Leukocytes, UA LARGE (A)  Pregnancy, urine [id number] Collected:  09/03/16 2052  Updated: 09/03/16 2125   Specimen Type: Urine   Specimen Source: Urine, Clean Catch    Preg Test, Ur NEGATIVE   Vent. rate 102 BPM PR interval 130 ms QRS duration 82 ms QT/QTc 332/432 ms P-R-T axes 55 15 30 Sinus tachycardia Possible Left atrial enlargement Nonspecific T wave abnormality Abnormal ECG  CT chest angiogram IMPRESSION: 1. Pulmonary embolus within the right main pulmonary artery, extending into all lobes of the right lung. Segmental pulmonary embolus within the pulmonary artery to the left lower lobe. CT evidence of right heart strain (RV/LV Ratio = 0.91) consistent with at least submassive (intermediate risk) PE. The presence of right heart strain has been associated with an increased risk of morbidity and mortality. Please activate Code PE by paging [phone]. 2. Minimal bibasilar atelectasis noted.  Lungs otherwise clear. 3. **An incidental finding of potential clinical significance has been found. 2.4 cm exophytic nodule arising at the inferior aspect of the left thyroid lobe. Recommend further evaluation with thyroid ultrasound. If patient is clinically hyperthyroid, consider nuclear medicine thyroid uptake and scan.** 4. 2.2 cm left adrenal adenoma noted.  Tennis Must, MD

## 2016-09-03 NOTE — ED Provider Notes (Signed)
[facility] Provider Note   CSN: [id number] Arrival date & time: 09/03/16  2001 By signing my name below, I, Caitlin Taylor, attest that this documentation has been prepared under the direction and in the presence of [name], MD . Electronically Signed: Dyke Taylor, Scribe. 09/03/2016. 10:44 PM.   History   Chief Complaint Chief Complaint  Patient presents with  . Shortness of Breath   HPI Caitlin Taylor is a 46 y.o. female with a history of hypertension and stage 2 CKD who presents to the Emergency Department complaining of gradually worsening shortness of breath onset one month ago. Per pt, SOB is worse at night and with exertion. No alleviating factors noted. Pt notes associated wheezing, fatigue, nonproductive cough, and burning central chest pain. Pt also reports swelling to her bilateral feet and legs "for years", but now reports swelling to her face and abdomen. She denies and chest pain or discomfort upon exam, abdominal pain, or any other associated symptoms.   The history is provided by the patient. No language interpreter was used.   Past Medical History:  Diagnosis Date  . Chronic back pain   . Hypertension     Patient Active Problem List   Diagnosis Date Noted  . Bilateral pulmonary embolism ([place]) 09/03/2016  . Benign essential HTN   . Hypokalemia   . Itching   . Swelling   . CKD (chronic kidney disease)   . Acute lower UTI   . Leukocytosis   . Lumbar myelopathy ([place]) 01/12/2016  . Low back pain   . Lumbar stenosis   . S/P lumbar fusion   . Post-operative pain   . Acute blood loss anemia   . Constipation due to pain medication   . Morbid obesity due to excess calories ([place])   . Back pain 01/05/2016  . Renal insufficiency 01/05/2016  . Hyperglycemia 01/05/2016  . Lumbar radiculopathy 01/05/2016  . UTI (urinary tract infection) 01/05/2016  . CKD (chronic kidney disease) stage 2, GFR 60-89 ml/min 01/05/2016  . Essential hypertension  01/05/2016    Past Surgical History:  Procedure Laterality Date  . CESAREAN SECTION    . MAXIMUM ACCESS (MAS)POSTERIOR LUMBAR INTERBODY FUSION (PLIF) 1 LEVEL N/A 01/06/2016   Procedure: FOR MAXIMUM ACCESS (MAS) POSTERIOR LUMBAR INTERBODY FUSION (PLIF) LUMBAR FOUR-FIVE;  Surgeon: Kevan Ny Ditty, MD;  Location: [facility];  Service: Neurosurgery;  Laterality: N/A;    OB History    No data available     Home Medications    Prior to Admission medications   Medication Sig Start Date End Date Taking? Authorizing Provider  spironolactone (ALDACTONE) 50 MG tablet Take 50 mg by mouth daily.   Yes Historical Provider, MD  Cyanocobalamin (VITAMIN B-12 PO) Take 1 tablet by mouth daily.    Historical Provider, MD  ferrous sulfate 325 (65 FE) MG tablet Take 1 tablet (325 mg total) by mouth daily with breakfast. 01/21/16   [name], PA-C  furosemide (LASIX) 80 MG tablet Take 1 tablet (80 mg total) by mouth 2 (two) times daily. 01/21/16   [name], PA-C  gabapentin (NEURONTIN) 300 MG capsule Take 1 capsule (300 mg total) by mouth 3 (three) times daily. 01/21/16   [name], PA-C  lisinopril (PRINIVIL,ZESTRIL) 10 MG tablet Take 1 tablet (10 mg total) by mouth daily. 01/21/16   [name], PA-C  methocarbamol (ROBAXIN) 750 MG tablet Take 1 tablet (750 mg total) by mouth every 6 (six) hours as needed  for muscle spasms. 01/21/16   [name], PA-C  oxyCODONE (OXY IR/ROXICODONE) 5 MG immediate release tablet Take 1-2 tablets (5-10 mg total) by mouth every 6 (six) hours as needed for severe pain or breakthrough pain. 01/21/16   [name], PA-C  pantoprazole (PROTONIX) 40 MG tablet Take 1 tablet (40 mg total) by mouth daily. 01/21/16   [name], PA-C  potassium chloride SA (K-DUR,KLOR-CON) 20 MEQ tablet Take 1 tablet (20 mEq total) by mouth 2 (two) times daily. 01/21/16   [name], PA-C  vitamin C (VITAMIN C) 250 MG tablet Take 1 tablet (250 mg  total) by mouth daily with breakfast. 01/21/16   [name], PA-C    Family History Family History  Problem Relation Age of Onset  . Congestive Heart Failure Father     Social History Social History  Substance Use Topics  . Smoking status: Never Smoker  . Smokeless tobacco: Never Used  . Alcohol use No    Allergies   Patient has no known allergies.  Review of Systems Review of Systems 10 systems reviewed and all are negative for acute change except as noted in the HPI.   Physical Exam Updated Vital Signs BP 126/78 (BP Location: Right Arm)   Pulse 91   Temp 98.6 F (37 C) (Oral)   Resp 18   Ht 5' 3.5" (1.613 m)   Wt 260 lb (117.9 kg)   LMP 08/20/2016   SpO2 93%   BMI 45.33 kg/m   Physical Exam  Constitutional: She is oriented to person, place, and time. She appears well-developed and well-nourished. No distress.  HENT:  Head: Normocephalic and atraumatic.  Eyes: Conjunctivae are normal.  Cardiovascular: Tachycardia present.   Mild tachycardia  Pulmonary/Chest: Effort normal.  Abdominal: She exhibits no distension.  Musculoskeletal: She exhibits edema.  3+ pitting edema BLE. Calves are non tender.   Neurological: She is alert and oriented to person, place, and time.  Skin: Skin is warm and dry.  Psychiatric: She has a normal mood and affect.  Nursing note and vitals reviewed.  ED Treatments / Results  DIAGNOSTIC STUDIES:  Oxygen Saturation is 94% on RA, low by my interpretation.    COORDINATION OF CARE:  10:40 PM Discussed treatment plan with pt at bedside and pt agreed to plan.   Labs (all labs ordered are listed, but only abnormal results are displayed) Labs Reviewed  CBC WITH DIFFERENTIAL/PLATELET - Abnormal; Notable for the following:       Result Value   WBC 11.7 (*)    HCT 35.9 (*)    Neutro Abs 10.1 (*)    All other components within normal limits  BASIC METABOLIC PANEL - Abnormal; Notable for the following:    Potassium 3.4 (*)     Glucose, Bld 148 (*)    BUN 26 (*)    Creatinine, Ser 1.53 (*)    Calcium 8.7 (*)    GFR calc non Af Amer 40 (*)    GFR calc Af Amer 46 (*)    All other components within normal limits  URINALYSIS, ROUTINE W REFLEX MICROSCOPIC - Abnormal; Notable for the following:    APPearance TURBID (*)    Hgb urine dipstick LARGE (*)    Protein, ur 30 (*)    Nitrite POSITIVE (*)    Leukocytes, UA LARGE (*)    All other components within normal limits  D-DIMER, QUANTITATIVE (NOT AT [facility]) - Abnormal; Notable for the following:  D-Dimer, Quant 8.64 (*)    All other components within normal limits  URINALYSIS, MICROSCOPIC (REFLEX) - Abnormal; Notable for the following:    Bacteria, UA MANY (*)    Squamous Epithelial / LPF 6-30 (*)    All other components within normal limits  URINE CULTURE  BRAIN NATRIURETIC PEPTIDE  PREGNANCY, URINE  TROPONIN I  TSH  HEPARIN LEVEL (UNFRACTIONATED)    EKG  EKG Interpretation None      Radiology Dg Chest 2 View  Result Date: 09/03/2016 CLINICAL DATA:  Shortness of breath 1 month with cough and chest pain as well as wheezing. EXAM: CHEST  2 VIEW COMPARISON:  None. FINDINGS: Lungs are adequately inflated without focal consolidation or effusion. Cardiomediastinal silhouette is within normal. Remaining bones and soft tissues are within normal. IMPRESSION: No active cardiopulmonary disease. Electronically Signed   By: Marin Olp M.D.   On: 09/03/2016 21:33   Ct Angio Chest Pe W And/or Wo Contrast  Result Date: 09/03/2016 CLINICAL DATA:  Acute onset of shortness of breath with exertion. Bilateral lower extremity swelling. Initial encounter. EXAM: CT ANGIOGRAPHY CHEST WITH CONTRAST TECHNIQUE: Multidetector CT imaging of the chest was performed using the standard protocol during bolus administration of intravenous contrast. Multiplanar CT image reconstructions and MIPs were obtained to evaluate the vascular anatomy. CONTRAST:  80 mL of Isovue 370 IV contrast  COMPARISON:  Chest radiograph performed earlier today at 9:20 p.m. FINDINGS: Cardiovascular: There is pulmonary embolus within the right main pulmonary artery, extending into all lobes of the right lung. There is also segmental pulmonary embolus within a pulmonary artery to the left lower lobe. The RV/LV ratio is just above 0.9, corresponding to mild right heart strain and at least submassive pulmonary embolus. The heart is otherwise unremarkable in appearance. The thoracic aorta is unremarkable. No calcific atherosclerotic disease is seen. The vessels are grossly unremarkable. Mediastinum/Nodes: The mediastinum is otherwise unremarkable in appearance. No mediastinal lymphadenopathy is seen. No pericardial effusion is identified. A 2.4 cm exophytic nodule is seen arising at the inferior aspect of the left thyroid lobe. No axillary lymphadenopathy is seen. Lungs/Pleura: Minimal bibasilar atelectasis is noted. No pleural effusion or pneumothorax is seen. No masses are identified. Upper Abdomen: The visualized portions of the liver and spleen are grossly unremarkable. The visualized portions of the gallbladder, pancreas, right and kidneys are grossly unremarkable. A 2.2 cm left adrenal adenoma is noted. Nonspecific left-sided perinephric stranding is seen. Musculoskeletal: No acute osseous abnormalities are identified. The visualized musculature is unremarkable in appearance. Review of the MIP images confirms the above findings. IMPRESSION: 1. Pulmonary embolus within the right main pulmonary artery, extending into all lobes of the right lung. Segmental pulmonary embolus within the pulmonary artery to the left lower lobe. CT evidence of right heart strain (RV/LV Ratio = 0.91) consistent with at least submassive (intermediate risk) PE. The presence of right heart strain has been associated with an increased risk of morbidity and mortality. Please activate Code PE by paging [phone]. 2. Minimal bibasilar atelectasis  noted.  Lungs otherwise clear. 3. **An incidental finding of potential clinical significance has been found. 2.4 cm exophytic nodule arising at the inferior aspect of the left thyroid lobe. Recommend further evaluation with thyroid ultrasound. If patient is clinically hyperthyroid, consider nuclear medicine thyroid uptake and scan.** 4. 2.2 cm left adrenal adenoma noted. Critical Value/emergent results were called by telephone at the time of interpretation on 09/03/2016 at 11:22 pm to Dr. [name], who verbally acknowledged these results.  Electronically Signed   By: Garald Balding M.D.   On: 09/03/2016 23:30    Procedures Procedures (including critical care time) CRITICAL CARE Performed by: [name]   Total critical care time: 30 minutes  Critical care time was exclusive of separately billable procedures and treating other patients.  Critical care was necessary to treat or prevent imminent or life-threatening deterioration.  Critical care was time spent personally by me on the following activities: development of treatment plan with patient and/or surrogate as well as nursing, discussions with consultants, evaluation of patient's response to treatment, examination of patient, obtaining history from patient or surrogate, ordering and performing treatments and interventions, ordering and review of laboratory studies, ordering and review of radiographic studies, pulse oximetry and re-evaluation of patient's condition. Medications Ordered in ED Medications  cefTRIAXone (ROCEPHIN) 1 g in dextrose 5 % 50 mL IVPB (1 g Intravenous New Bag/Given 09/03/16 2336)  heparin ADULT infusion 100 units/mL (25000 units/253mL sodium chloride 0.45%) (1,350 Units/hr Intravenous New Bag/Given 09/03/16 2338)  iopamidol (ISOVUE-370) 76 % injection 100 mL (100 mLs Intravenous Contrast Given 09/03/16 2257)  heparin bolus via infusion 5,000 Units (5,000 Units Intravenous Bolus from Bag 09/03/16 2345)      Initial Impression / Assessment and Plan / ED Course  I have reviewed the triage vital signs and the nursing notes.  Pertinent labs & imaging results that were available during my care of the patient were reviewed by me and considered in my medical decision making (see chart for details).     Consult: Reviewed with Dr. Olevia Bowens for admission.  Final Clinical Impressions(s) / ED Diagnoses   Final diagnoses:  Other acute pulmonary embolism with acute cor pulmonale (HCC)  Acute cystitis without hematuria  Patient has had indolent symptoms of extremity edema and dyspnea. This has gotten worse recently. Patient did have significant exertional hypoxia in the emergency department. Pulmonary embolus was confirmed on PE study (renal dosing was used for decreased GFR). Patient is started on heparin in the emergency department. She is alert and appropriate. She does not clinically exhibit respiratory distress at rest. At rest oxygen saturation is 95%. Patient will be admitted for treatment and ongoing diagnostic evaluation as well. New Prescriptions New Prescriptions   No medications on file     [name], MD 09/04/16 0004

## 2016-09-03 NOTE — ED Triage Notes (Signed)
Pt c/o swelling to feet for years, states swelling to face and "everywhere" with SOB x6wks, states SOB on exertion, no distress, speaking in complete sentences

## 2016-09-04 ENCOUNTER — Inpatient Hospital Stay (HOSPITAL_COMMUNITY): Payer: 59

## 2016-09-04 DIAGNOSIS — I2699 Other pulmonary embolism without acute cor pulmonale: Secondary | ICD-10-CM

## 2016-09-04 DIAGNOSIS — N182 Chronic kidney disease, stage 2 (mild): Secondary | ICD-10-CM

## 2016-09-04 DIAGNOSIS — Z86711 Personal history of pulmonary embolism: Secondary | ICD-10-CM

## 2016-09-04 DIAGNOSIS — R609 Edema, unspecified: Secondary | ICD-10-CM | POA: Diagnosis present

## 2016-09-04 DIAGNOSIS — I10 Essential (primary) hypertension: Secondary | ICD-10-CM

## 2016-09-04 LAB — HEPATIC FUNCTION PANEL
ALT: 52 U/L (ref 14–54)
AST: 34 U/L (ref 15–41)
Albumin: 3.5 g/dL (ref 3.5–5.0)
Alkaline Phosphatase: 42 U/L (ref 38–126)
Bilirubin, Direct: 0.2 mg/dL (ref 0.1–0.5)
Indirect Bilirubin: 0.3 mg/dL (ref 0.3–0.9)
TOTAL PROTEIN: 6.3 g/dL — AB (ref 6.5–8.1)
Total Bilirubin: 0.5 mg/dL (ref 0.3–1.2)

## 2016-09-04 LAB — BASIC METABOLIC PANEL
ANION GAP: 14 (ref 5–15)
BUN: 21 mg/dL — ABNORMAL HIGH (ref 6–20)
CHLORIDE: 105 mmol/L (ref 101–111)
CO2: 23 mmol/L (ref 22–32)
Calcium: 8.8 mg/dL — ABNORMAL LOW (ref 8.9–10.3)
Creatinine, Ser: 1.47 mg/dL — ABNORMAL HIGH (ref 0.44–1.00)
GFR calc Af Amer: 49 mL/min — ABNORMAL LOW (ref >60)
GFR, EST NON AFRICAN AMERICAN: 42 mL/min — AB (ref >60)
Glucose, Bld: 137 mg/dL — ABNORMAL HIGH (ref 65–99)
POTASSIUM: 3.5 mmol/L (ref 3.5–5.1)
SODIUM: 142 mmol/L (ref 135–145)

## 2016-09-04 LAB — ECHOCARDIOGRAM COMPLETE
E decel time: 303 msec
E/e' ratio: 5.89
FS: 27 % — AB (ref 28–44)
HEIGHTINCHES: 63.5 in
IVS/LV PW RATIO, ED: 1.03
LA ID, A-P, ES: 27 mm
LA diam end sys: 27 mm
LA vol A4C: 37 ml
LA vol index: 13.9 mL/m2
LA vol: 31 mL
LADIAMINDEX: 1.21 cm/m2
LDCA: 3.46 cm2
LV E/e' medial: 5.89
LV E/e'average: 5.89
LV PW d: 12.3 mm — AB (ref 0.6–1.1)
LV TDI E'LATERAL: 8.73
LV TDI E'MEDIAL: 9.11
LV e' LATERAL: 8.73 cm/s
LVOT VTI: 26.7 cm
LVOT peak grad rest: 8 mmHg
LVOTD: 21 mm
LVOTPV: 142 cm/s
LVOTSV: 92 mL
MV Dec: 303
MV pk E vel: 51.4 m/s
MVPKAVEL: 67.3 m/s
RV LATERAL S' VELOCITY: 11.7 cm/s
RV TAPSE: 17.5 mm
Weight: 4411.2 oz

## 2016-09-04 LAB — HEPARIN LEVEL (UNFRACTIONATED)
HEPARIN UNFRACTIONATED: 0.91 [IU]/mL — AB (ref 0.30–0.70)
Heparin Unfractionated: 0.65 IU/mL (ref 0.30–0.70)

## 2016-09-04 LAB — TSH: TSH: 0.451 u[IU]/mL (ref 0.350–4.500)

## 2016-09-04 LAB — CORTISOL: Cortisol, Plasma: 29.3 ug/dL

## 2016-09-04 LAB — MRSA PCR SCREENING: MRSA by PCR: NEGATIVE

## 2016-09-04 LAB — HIV ANTIBODY (ROUTINE TESTING W REFLEX): HIV SCREEN 4TH GENERATION: NONREACTIVE

## 2016-09-04 MED ORDER — DEXTROSE 5 % IV SOLN
1.0000 g | INTRAVENOUS | Status: DC
  Administered 2016-09-04 – 2016-09-05 (×2): 1 g via INTRAVENOUS
  Filled 2016-09-04 (×2): qty 10

## 2016-09-04 MED ORDER — LISINOPRIL 10 MG PO TABS
10.0000 mg | ORAL_TABLET | Freq: Every day | ORAL | Status: DC
  Administered 2016-09-04 – 2016-09-06 (×3): 10 mg via ORAL
  Filled 2016-09-04 (×3): qty 1

## 2016-09-04 MED ORDER — INFLUENZA VAC SPLIT QUAD 0.5 ML IM SUSY
0.5000 mL | PREFILLED_SYRINGE | INTRAMUSCULAR | Status: AC
  Administered 2016-09-06: 0.5 mL via INTRAMUSCULAR
  Filled 2016-09-04: qty 0.5

## 2016-09-04 MED ORDER — FUROSEMIDE 10 MG/ML IJ SOLN
40.0000 mg | Freq: Every day | INTRAMUSCULAR | Status: DC
  Administered 2016-09-04 – 2016-09-06 (×3): 40 mg via INTRAVENOUS
  Filled 2016-09-04 (×3): qty 4

## 2016-09-04 MED ORDER — LISINOPRIL 20 MG PO TABS: 20.0000 mg | ORAL_TABLET | Freq: Every day | ORAL | Status: DC

## 2016-09-04 MED ORDER — FUROSEMIDE 40 MG PO TABS: 40.0000 mg | ORAL_TABLET | Freq: Every day | ORAL | Status: DC

## 2016-09-04 MED ORDER — SODIUM CHLORIDE 0.9% FLUSH
3.0000 mL | Freq: Two times a day (BID) | INTRAVENOUS | Status: DC
  Administered 2016-09-04 – 2016-09-06 (×4): 3 mL via INTRAVENOUS

## 2016-09-04 MED ORDER — SPIRONOLACTONE 25 MG PO TABS
50.0000 mg | ORAL_TABLET | Freq: Every day | ORAL | Status: DC
  Administered 2016-09-04 – 2016-09-06 (×3): 50 mg via ORAL
  Filled 2016-09-04 (×3): qty 2

## 2016-09-04 MED ORDER — FUROSEMIDE 40 MG PO TABS: 40.0000 mg | ORAL_TABLET | Freq: Two times a day (BID) | ORAL | Status: DC

## 2016-09-04 MED ORDER — PERFLUTREN LIPID MICROSPHERE
INTRAVENOUS | Status: AC
  Administered 2016-09-04: 11:00:00
  Filled 2016-09-04: qty 10

## 2016-09-04 NOTE — Consult Note (Signed)
PULMONARY / CRITICAL CARE MEDICINE   Name: Caitlin Taylor MRN: [id number] DOB: 1971-05-28    ADMISSION DATE:  09/03/2016 CONSULTATION DATE:  09/04/16  REFERRING MD:  Derrek Gu MD  CHIEF COMPLAINT:  PE, DVT  HISTORY OF PRESENT ILLNESS:   46 year old with past medical history of morbid obesity, chronic back pain, hypertension. She was admitted on 2/23 with lower extremity edema, hypoxia. CT of the chest shows bilateral PE right > left. PCCM consulted for evaluation.  PAST MEDICAL HISTORY :  She  has a past medical history of Chronic back pain and Hypertension.  PAST SURGICAL HISTORY: She  has a past surgical history that includes Cesarean section and Maximum access (mas)posterior lumbar interbody fusion (plif) 1 level (N/A, 01/06/2016).  No Known Allergies  No current facility-administered medications on file prior to encounter.    Current Outpatient Prescriptions on File Prior to Encounter  Medication Sig  . furosemide (LASIX) 80 MG tablet Take 1 tablet (80 mg total) by mouth 2 (two) times daily. (Patient not taking: Reported on 09/04/2016)  . lisinopril (PRINIVIL,ZESTRIL) 10 MG tablet Take 1 tablet (10 mg total) by mouth daily. (Patient not taking: Reported on 09/04/2016)  . vitamin C (VITAMIN C) 250 MG tablet Take 1 tablet (250 mg total) by mouth daily with breakfast. (Patient not taking: Reported on 09/04/2016)    FAMILY HISTORY:  Her indicated that her mother is alive. She indicated that her father is deceased.    SOCIAL HISTORY: She  reports that she has never smoked. She has never used smokeless tobacco. She reports that she does not drink alcohol or use drugs.  REVIEW OF SYSTEMS:   C/O Of dyspnea on exertion for the past 1 month. Denies any chest pain, palpitations Denies any cough, wheezing, fevers, chills Complains of lower extremity edema, right leg pain. All other review of systems are negative  SUBJECTIVE:    VITAL SIGNS: BP (!) 143/80 (BP Location: Left Arm)    Pulse 88   Temp 97.6 F (36.4 C) (Oral)   Resp 18   Ht 5' 3.5" (1.613 m)   Wt 275 lb 11.2 oz (125.1 kg)   LMP 08/20/2016   SpO2 93%   BMI 48.07 kg/m   HEMODYNAMICS:    VENTILATOR SETTINGS:    INTAKE / OUTPUT: No intake/output data recorded.  PHYSICAL EXAMINATION: General:  Sitting at the side of the bed. No distress. Comfortable on room air Neuro:  PERRLA, no focal deficits HEENT:  No thyromegaly, JVD Cardiovascular: Regular rate and rhythm, no murmurs rubs gallops Lungs:  Clear, No wheeze or crackles Abdomen:  Soft, + BS Musculoskeletal:  3+ B/L pitting edema Skin:  Intact  LABS:  BMET  Recent Labs Lab 09/03/16 2050 09/04/16 0418  NA 140 142  K 3.4* 3.5  CL 104 105  CO2 25 23  BUN 26* 21*  CREATININE 1.53* 1.47*  GLUCOSE 148* 137*    Electrolytes  Recent Labs Lab 09/03/16 2050 09/04/16 0418  CALCIUM 8.7* 8.8*    CBC  Recent Labs Lab 09/03/16 2050  WBC 11.7*  HGB 12.2  HCT 35.9*  PLT 158    Coag's No results for input(s): APTT, INR in the last 168 hours.  Sepsis Markers No results for input(s): LATICACIDVEN, PROCALCITON, O2SATVEN in the last 168 hours.  ABG No results for input(s): PHART, PCO2ART, PO2ART in the last 168 hours.  Liver Enzymes  Recent Labs Lab 09/04/16 0418  AST 34  ALT 52  ALKPHOS 42  BILITOT  0.5  ALBUMIN 3.5    Cardiac Enzymes  Recent Labs Lab 09/03/16 2050  TROPONINI <0.03    Glucose No results for input(s): GLUCAP in the last 168 hours.  Imaging CT chest 09/03/16- right main pulmonary artery PE, subsegmental left lower lobe. RV/LV ratio 0.91. Bibasilar atelectasis Chest x-ray 09/03/16-no acute cardiopulmonary abnormality I have reviewed all images personally  STUDIES:  EKG 09/03/16- sinus tachycardia, no acute changes indicative of RV strain Echocardiogram 09/04/16-LVEF 65-70 percent, grade 1 diastolic dysfunction Normal RV size and function. No pulmonary regurgitation, tricuspid  regurgitation. Lower extremity Doppler 09/04/16-right DVT  Troponin 09/03/16- < 0.03 BNP 09/03/16- 52  CULTURES: Ucx 09/04/16- Pending  ANTIBIOTICS: Ceftriaxone 2/23 >>  SIGNIFICANT EVENTS: 2/23- Admit with acute PE  LINES/TUBES:   DISCUSSION: Acute PE, DVT No evidence of RV strain on echo, EKG. Normal troponins, BNP  -Agree with heparin anticoagulation -No role for thrombolytics at this point.   PCCM will be available as needed. Please call with any questions.   Marshell Garfinkel MD [facility] Pager [phone] If no answer or after 3pm call: [phone] 09/04/2016, 5:23 PM

## 2016-09-04 NOTE — H&P (Addendum)
History and Physical    JOZALYNN CRIHFIELD [id number] DOB: [birth year] DOA: 09/03/2016   PCP: [name], MD Chief Complaint:  Chief Complaint  Patient presents with  . Shortness of Breath    HPI: Caitlin Taylor is a 46 y.o. female with medical history significant of hypertension and stage 2 CKD who presents to the Emergency Department at [facility] complaining of gradually worsening shortness of breath onset one month ago.  Per pt, SOB is worse at night and with exertion.  No alleviating factors noted.  Pt notes associated wheezing, fatigue, nonproductive cough, and burning, central, chest pain.  Pt also reports swelling to her bilateral feet and legs "for years", but now reports swelling to her face and abdomen which is new as well as worsening of BLE swelling.  Lasix dose was decreased in recent months due to concern over kidney function.  ED Course: Found to have B PE R>L.  Found to have UTI.  Review of Systems: As per HPI otherwise 10 point review of systems negative.    Past Medical History:  Diagnosis Date  . Chronic back pain   . Hypertension     Past Surgical History:  Procedure Laterality Date  . CESAREAN SECTION    . MAXIMUM ACCESS (MAS)POSTERIOR LUMBAR INTERBODY FUSION (PLIF) 1 LEVEL N/A 01/06/2016   Procedure: FOR MAXIMUM ACCESS (MAS) POSTERIOR LUMBAR INTERBODY FUSION (PLIF) LUMBAR FOUR-FIVE;  Surgeon: Kevan Ny Ditty, MD;  Location: [place] [facility];  Service: Neurosurgery;  Laterality: N/A;     reports that she has never smoked. She has never used smokeless tobacco. She reports that she does not drink alcohol or use drugs.  No Known Allergies  Family History  Problem Relation Age of Onset  . Congestive Heart Failure Father       Prior to Admission medications   Medication Sig Start Date End Date Taking? Authorizing Provider  spironolactone (ALDACTONE) 50 MG tablet Take 50 mg by mouth daily.   Yes Historical Provider, MD  furosemide (LASIX) 80 MG tablet  Take 1 tablet (80 mg total) by mouth 2 (two) times daily. 01/21/16   [name], PA-C  lisinopril (PRINIVIL,ZESTRIL) 10 MG tablet Take 1 tablet (10 mg total) by mouth daily. 01/21/16   [name], PA-C  vitamin C (VITAMIN C) 250 MG tablet Take 1 tablet (250 mg total) by mouth daily with breakfast. 01/21/16   Cathlyn Parsons, PA-C    Physical Exam: Vitals:   09/03/16 2252 09/03/16 2254 09/04/16 0030 09/04/16 0232  BP: 126/78  135/77 (!) 179/98  Pulse: 91  94   Resp: 18  17   Temp:    98.3 F (36.8 C)  TempSrc:    Oral  SpO2: 93%  91%   Weight: 117.9 kg (260 lb) 117.9 kg (260 lb)  125.1 kg (275 lb 11.2 oz)  Height: 5' 3.5" (1.613 m) 5' 3.5" (1.613 m)  5' 3.5" (1.613 m)      Constitutional: NAD, calm, comfortable, cushingoid appearance Eyes: PERRL, lids and conjunctivae normal ENMT: Mucous membranes are moist. Posterior pharynx clear of any exudate or lesions.Normal dentition.  Neck: normal, supple, no masses, no thyromegaly Respiratory: clear to auscultation bilaterally, no wheezing, no crackles. Normal respiratory effort. No accessory muscle use.  Cardiovascular: Regular rate and rhythm, no murmurs / rubs / gallops. 4+ BLE pitting edema. 2+ pedal pulses. No carotid bruits.  Abdomen: no tenderness, no masses palpated. No hepatosplenomegaly. Bowel sounds positive.  Musculoskeletal: no clubbing / cyanosis. No joint  deformity upper and lower extremities. Good ROM, no contractures. Normal muscle tone.  Skin: no rashes, lesions, ulcers. No induration Neurologic: CN 2-12 grossly intact. Sensation intact, DTR normal. Strength 5/5 in all 4.  Psychiatric: Normal judgment and insight. Alert and oriented x 3. Normal mood.    Labs on Admission: I have personally reviewed following labs and imaging studies  CBC:  Recent Labs Lab 09/03/16 2050  WBC 11.7*  NEUTROABS 10.1*  HGB 12.2  HCT 35.9*  MCV 90.0  PLT 0000000   Basic Metabolic Panel:  Recent Labs Lab 09/03/16 2050    NA 140  K 3.4*  CL 104  CO2 25  GLUCOSE 148*  BUN 26*  CREATININE 1.53*  CALCIUM 8.7*   GFR: Estimated Creatinine Clearance: 60.3 mL/min (by C-G formula based on SCr of 1.53 mg/dL (H)). Liver Function Tests: No results for input(s): AST, ALT, ALKPHOS, BILITOT, PROT, ALBUMIN in the last 168 hours. No results for input(s): LIPASE, AMYLASE in the last 168 hours. No results for input(s): AMMONIA in the last 168 hours. Coagulation Profile: No results for input(s): INR, PROTIME in the last 168 hours. Cardiac Enzymes:  Recent Labs Lab 09/03/16 2050  TROPONINI <0.03   BNP (last 3 results) No results for input(s): PROBNP in the last 8760 hours. HbA1C: No results for input(s): HGBA1C in the last 72 hours. CBG: No results for input(s): GLUCAP in the last 168 hours. Lipid Profile: No results for input(s): CHOL, HDL, LDLCALC, TRIG, CHOLHDL, LDLDIRECT in the last 72 hours. Thyroid Function Tests:  Recent Labs  09/03/16 2050  TSH 0.451   Anemia Panel: No results for input(s): VITAMINB12, FOLATE, FERRITIN, TIBC, IRON, RETICCTPCT in the last 72 hours. Urine analysis:    Component Value Date/Time   COLORURINE YELLOW 09/03/2016 2052   APPEARANCEUR TURBID (A) 09/03/2016 2052   LABSPEC 1.016 09/03/2016 2052   PHURINE 5.5 09/03/2016 2052   GLUCOSEU NEGATIVE 09/03/2016 2052   HGBUR LARGE (A) 09/03/2016 2052   BILIRUBINUR NEGATIVE 09/03/2016 2052   [place] NEGATIVE 09/03/2016 2052   PROTEINUR 30 (A) 09/03/2016 2052   NITRITE POSITIVE (A) 09/03/2016 2052   LEUKOCYTESUR LARGE (A) 09/03/2016 2052   Sepsis Labs: @LABRCNTIP (procalcitonin:4,lacticidven:4) )No results found for this or any previous visit (from the past 240 hour(s)).   Radiological Exams on Admission: Dg Chest 2 View  Result Date: 09/03/2016 CLINICAL DATA:  Shortness of breath 1 month with cough and chest pain as well as wheezing. EXAM: CHEST  2 VIEW COMPARISON:  None. FINDINGS: Lungs are adequately inflated  without focal consolidation or effusion. Cardiomediastinal silhouette is within normal. Remaining bones and soft tissues are within normal. IMPRESSION: No active cardiopulmonary disease. Electronically Signed   By: Marin Olp M.D.   On: 09/03/2016 21:33   Ct Angio Chest Pe W And/or Wo Contrast  Result Date: 09/03/2016 CLINICAL DATA:  Acute onset of shortness of breath with exertion. Bilateral lower extremity swelling. Initial encounter. EXAM: CT ANGIOGRAPHY CHEST WITH CONTRAST TECHNIQUE: Multidetector CT imaging of the chest was performed using the standard protocol during bolus administration of intravenous contrast. Multiplanar CT image reconstructions and MIPs were obtained to evaluate the vascular anatomy. CONTRAST:  80 mL of Isovue 370 IV contrast COMPARISON:  Chest radiograph performed earlier today at 9:20 p.m. FINDINGS: Cardiovascular: There is pulmonary embolus within the right main pulmonary artery, extending into all lobes of the right lung. There is also segmental pulmonary embolus within a pulmonary artery to the left lower lobe. The RV/LV ratio is just  above 0.9, corresponding to mild right heart strain and at least submassive pulmonary embolus. The heart is otherwise unremarkable in appearance. The thoracic aorta is unremarkable. No calcific atherosclerotic disease is seen. The vessels are grossly unremarkable. Mediastinum/Nodes: The mediastinum is otherwise unremarkable in appearance. No mediastinal lymphadenopathy is seen. No pericardial effusion is identified. A 2.4 cm exophytic nodule is seen arising at the inferior aspect of the left thyroid lobe. No axillary lymphadenopathy is seen. Lungs/Pleura: Minimal bibasilar atelectasis is noted. No pleural effusion or pneumothorax is seen. No masses are identified. Upper Abdomen: The visualized portions of the liver and spleen are grossly unremarkable. The visualized portions of the gallbladder, pancreas, right and kidneys are grossly unremarkable.  A 2.2 cm left adrenal adenoma is noted. Nonspecific left-sided perinephric stranding is seen. Musculoskeletal: No acute osseous abnormalities are identified. The visualized musculature is unremarkable in appearance. Review of the MIP images confirms the above findings. IMPRESSION: 1. Pulmonary embolus within the right main pulmonary artery, extending into all lobes of the right lung. Segmental pulmonary embolus within the pulmonary artery to the left lower lobe. CT evidence of right heart strain (RV/LV Ratio = 0.91) consistent with at least submassive (intermediate risk) PE. The presence of right heart strain has been associated with an increased risk of morbidity and mortality. Please activate Code PE by paging [phone]. 2. Minimal bibasilar atelectasis noted.  Lungs otherwise clear. 3. **An incidental finding of potential clinical significance has been found. 2.4 cm exophytic nodule arising at the inferior aspect of the left thyroid lobe. Recommend further evaluation with thyroid ultrasound. If patient is clinically hyperthyroid, consider nuclear medicine thyroid uptake and scan.** 4. 2.2 cm left adrenal adenoma noted. Critical Value/emergent results were called by telephone at the time of interpretation on 09/03/2016 at 11:22 pm to Dr. [name], who verbally acknowledged these results. Electronically Signed   By: Garald Balding M.D.   On: 09/03/2016 23:30    EKG: Independently reviewed.  Assessment/Plan Principal Problem:   Bilateral pulmonary embolism (HCC) Active Problems:   CKD (chronic kidney disease) stage 2, GFR 60-89 ml/min   Acute lower UTI   Benign essential HTN   Edema    1. B PE - 1. Heparin gtt 2. 2d echo tomorrow for above and for chronic Edema assessment as well 3. Tele monitor 4. BLE dopplers 2. Edema - Cor pulmonale? 1. DDx includes cardiogenic vs nephrogenic vs Cushing's 2. 2d echo 3. BLE dopplers 4. Monitor renal function 5. Check albumin and total protein with  LFTs ordered this AM 6. Will obtain PM cortisol level tomorrow evening at 2200 to try and begin rule out of Cushing's (unless day team wants to switch to 24 hr urine free cortisol or 1mg  DST). 7. Will increase lasix to 40mg  BID 1. Daily BMPs while inpatient to monitor renal function with this increase 3. UTI - 1. Rocephin 2. Urine culture pending 4. HTN - continue home meds   DVT prophylaxis: Heparin gtt Code Status: Full Family Communication: No family in room Consults called: None Admission status: Admit to inpatient   Etta Quill DO [facility] Pager [phone] from 7PM-7AM  If 7AM-7PM, please contact the day physician for the patient [email or web address] Password [facility]  09/04/2016, 3:34 AM

## 2016-09-04 NOTE — Progress Notes (Signed)
ANTICOAGULATION CONSULT NOTE - Follow up [place] for Heparin Indication: Bilateral pulmonary embolus   No Known Allergies  Patient Measurements: Height: 5' 3.5" (161.3 cm) Weight: 275 lb 11.2 oz (125.1 kg) IBW/kg (Calculated) : 53.55 Heparin Dosing Weight: 83 kg  Vital Signs: Temp: 98.5 F (36.9 C) (02/24 0747) Temp Source: Oral (02/24 0747) BP: 153/94 (02/24 0747) Pulse Rate: 93 (02/24 0747)  Labs:  Recent Labs  09/03/16 2050 09/04/16 0418  HGB 12.2  --   HCT 35.9*  --   PLT 158  --   CREATININE 1.53* 1.47*  TROPONINI <0.03  --     Estimated Creatinine Clearance: 62.7 mL/min (by C-G formula based on SCr of 1.47 mg/dL (H)).   Medical History: Past Medical History:  Diagnosis Date  . Chronic back pain   . Hypertension     Assessment: 46 y.o. F presents with SOB, bilateral PE seen on CT angiogram per note. No AC PTA.    Heparin level SUPRAtherapeutic on 1350 units/hr s/p bolus. CBC stable, wnl. No bleeding noted.   Goal of Therapy:  Heparin level 0.3-0.7 units/ml Monitor platelets by anticoagulation protocol: Yes   Plan:   -Decrease Heparin to 1200 units/hr -Confirmatory heparin level 6hrs -Daily heparin level, CBC -f/u transition to [place], Pharm.D. PGY1 Pharmacy Resident 2/24/201811:05 AM Pager [phone]

## 2016-09-04 NOTE — Progress Notes (Signed)
Patient seen and examined  47 yo female with worsening chronic lower extremities edema who presented to [facility] with anasarca and hypoxia. Her D-dimer was elevated at 8.64, BNP was normal. Two view CxR is normal. Troponin negative. CT angiogram of the chest showed B/L pulmonary embolism. Started empirically on continuous heparin infusion. She also has U/A with pyuria and bacteriuria. Accepted to stepdown/inpatient. She will need  echocardiogram and lower extremities venous scan to R/o DVT  Assessment and plan 1. Bilateral pulmonary embolism 1. Heparin gtt, PCCM consult for evaluation of fibrinolytics 2. 2d echo tomorrow for above and for chronic Edema assessment as well 3. Tele monitor 4. BLE dopplers   2. Bilateral lower extremity edema 1. DDx includes cardiogenic vs nephrotic syndrome? vs Cushing's 2. 2d echo to rule out congestive heart failure/pulmonary hypertension/right-sided heart failure 3. BLE dopplers 4. 24 urine protein,r/o nephrotic syndrome  5. Albumin okay 6. Continue Lasix  3. UTI - 1. Rocephin 2. Urine culture pending   4. HTN - continue home meds   5. Incidental thyroid nodule-  2.4 cm exophytic nodule arising at the inferior aspect of the left thyroid lobe. Order thyroid ultrasound. Will need outpatient endocrinology evaluation. Also found to have 2.2 cm left adrenal adenoma noted.Disucssed with patient

## 2016-09-04 NOTE — Progress Notes (Signed)
VASCULAR LAB PRELIMINARY  PRELIMINARY  PRELIMINARY  PRELIMINARY  Bilateral lower extremity venous duplex completed.    Preliminary report:  There is acute DVT noted in the right femoral and popliteal veins.    Called report to Maudie Mercury, RN  Mauro Kaufmann, Raybon Conard, RVT 09/04/2016, 2:11 PM

## 2016-09-04 NOTE — Progress Notes (Signed)
ANTICOAGULATION CONSULT NOTE - Follow up [place] for Heparin Indication: Bilateral pulmonary embolus and DVT  No Known Allergies  Patient Measurements: Height: 5' 3.5" (161.3 cm) Weight: 275 lb 11.2 oz (125.1 kg) IBW/kg (Calculated) : 53.55 Heparin Dosing Weight: 83 kg  Vital Signs: Temp: 97.6 F (36.4 C) (02/24 1553) Temp Source: Oral (02/24 1553) BP: 143/80 (02/24 1553) Pulse Rate: 88 (02/24 1553)  Labs:  Recent Labs  09/03/16 2050 09/04/16 0418 09/04/16 1153 09/04/16 1825  HGB 12.2  --   --   --   HCT 35.9*  --   --   --   PLT 158  --   --   --   HEPARINUNFRC  --   --  0.91* 0.65  CREATININE 1.53* 1.47*  --   --   TROPONINI <0.03  --   --   --     Estimated Creatinine Clearance: 62.7 mL/min (by C-G formula based on SCr of 1.47 mg/dL (H)).   Medical History: Past Medical History:  Diagnosis Date  . Chronic back pain   . Hypertension     Assessment: 46 y.o. F presents with acute PE and DVT.   Heparin level is therapeutic this evening at 0.65 after most recent dose adjustment to 1200 units/hr.    Goal of Therapy:  Heparin level 0.3-0.7 units/ml Monitor platelets by anticoagulation protocol: Yes   Plan:  -Continue heparin infusion at 1200 units/hr -Confirmatory heparin level in am  -Daily heparin level, CBC -F/u transition to [place], PharmD, BCPS 09/04/2016, 7:30 PM

## 2016-09-05 DIAGNOSIS — I824Y1 Acute embolism and thrombosis of unspecified deep veins of right proximal lower extremity: Secondary | ICD-10-CM

## 2016-09-05 DIAGNOSIS — N3000 Acute cystitis without hematuria: Secondary | ICD-10-CM

## 2016-09-05 LAB — BASIC METABOLIC PANEL
Anion gap: 13 (ref 5–15)
BUN: 17 mg/dL (ref 6–20)
CHLORIDE: 102 mmol/L (ref 101–111)
CO2: 26 mmol/L (ref 22–32)
CREATININE: 1.39 mg/dL — AB (ref 0.44–1.00)
Calcium: 9 mg/dL (ref 8.9–10.3)
GFR calc Af Amer: 52 mL/min — ABNORMAL LOW (ref >60)
GFR, EST NON AFRICAN AMERICAN: 45 mL/min — AB (ref >60)
GLUCOSE: 99 mg/dL (ref 65–99)
POTASSIUM: 3.7 mmol/L (ref 3.5–5.1)
Sodium: 141 mmol/L (ref 135–145)

## 2016-09-05 LAB — CBC
HCT: 34.7 % — ABNORMAL LOW (ref 36.0–46.0)
Hemoglobin: 11.5 g/dL — ABNORMAL LOW (ref 12.0–15.0)
MCH: 29.5 pg (ref 26.0–34.0)
MCHC: 33.1 g/dL (ref 30.0–36.0)
MCV: 89 fL (ref 78.0–100.0)
PLATELETS: 157 10*3/uL (ref 150–400)
RBC: 3.9 MIL/uL (ref 3.87–5.11)
RDW: 13.8 % (ref 11.5–15.5)
WBC: 9.3 10*3/uL (ref 4.0–10.5)

## 2016-09-05 LAB — PROTEIN, URINE, 24 HOUR
Collection Interval-UPROT: 24 hours
PROTEIN, 24H URINE: 668 mg/d — AB (ref 50–100)
Protein, Urine: 30 mg/dL
Urine Total Volume-UPROT: 2225 mL

## 2016-09-05 LAB — URINE CULTURE: CULTURE: NO GROWTH

## 2016-09-05 LAB — HEPARIN LEVEL (UNFRACTIONATED): Heparin Unfractionated: 0.61 IU/mL (ref 0.30–0.70)

## 2016-09-05 MED ORDER — APIXABAN 5 MG PO TABS: 5.0000 mg | ORAL_TABLET | Freq: Two times a day (BID) | ORAL | Status: DC

## 2016-09-05 MED ORDER — APIXABAN 5 MG PO TABS
10.0000 mg | ORAL_TABLET | Freq: Two times a day (BID) | ORAL | Status: DC
  Administered 2016-09-05 – 2016-09-06 (×2): 10 mg via ORAL
  Filled 2016-09-05: qty 2

## 2016-09-05 MED ORDER — APIXABAN 5 MG PO TABS: 10.0000 mg | ORAL_TABLET | Freq: Two times a day (BID) | ORAL | Status: DC

## 2016-09-05 MED ORDER — METOPROLOL TARTRATE 12.5 MG HALF TABLET
12.5000 mg | ORAL_TABLET | Freq: Two times a day (BID) | ORAL | Status: DC
  Administered 2016-09-05: 12.5 mg via ORAL
  Filled 2016-09-05: qty 1

## 2016-09-05 MED ORDER — APIXABAN 5 MG PO TABS
10.0000 mg | ORAL_TABLET | Freq: Two times a day (BID) | ORAL | Status: DC
  Administered 2016-09-05: 10 mg via ORAL
  Filled 2016-09-05 (×2): qty 2

## 2016-09-05 MED ORDER — METOPROLOL TARTRATE 25 MG PO TABS
25.0000 mg | ORAL_TABLET | Freq: Two times a day (BID) | ORAL | Status: DC
  Administered 2016-09-05: 25 mg via ORAL
  Filled 2016-09-05: qty 1

## 2016-09-05 NOTE — Progress Notes (Addendum)
ANTICOAGULATION CONSULT NOTE - Follow up [place] for Heparin > apixaban Indication: Bilateral pulmonary embolus and DVT  No Known Allergies  Patient Measurements: Height: 5' 3.5" (161.3 cm) Weight: 276 lb 1.6 oz (125.2 kg) IBW/kg (Calculated) : 53.55 Heparin Dosing Weight: 83 kg  Vital Signs: Temp: 98.1 F (36.7 C) (02/25 0735) Temp Source: Oral (02/25 0735) BP: 150/103 (02/25 0735) Pulse Rate: 115 (02/25 0642)  Labs:  Recent Labs  09/03/16 2050 09/04/16 0418 09/04/16 1153 09/04/16 1825 09/05/16 0446  HGB 12.2  --   --   --  11.5*  HCT 35.9*  --   --   --  34.7*  PLT 158  --   --   --  157  HEPARINUNFRC  --   --  0.91* 0.65 0.61  CREATININE 1.53* 1.47*  --   --  1.39*  TROPONINI <0.03  --   --   --   --     Estimated Creatinine Clearance: 66.3 mL/min (by C-G formula based on SCr of 1.39 mg/dL (H)).   Medical History: Past Medical History:  Diagnosis Date  . Chronic back pain   . Hypertension     Assessment: 46 y.o. F presents with acute PE and DVT.   Heparin level is therapeutic x2 at 0.61 after dose reduction to 1200 units/hr. No bleeding noted.    Goal of Therapy:  Heparin level 0.3-0.7 units/ml Monitor platelets by anticoagulation protocol: Yes   Plan:  -Continue heparin infusion at 1200 units/hr -Daily heparin level, CBC -F/u transition to [place], Pharm.D. PGY1 Pharmacy Resident 2/25/201811:26 AM Pager [phone]  ------------------------------------  ADDENDUM 09/05/2016 3:14 PM  - pharmacy to transition heparin to apixaban for treatment of PE/DVT -SCr <1.5, Age <80, Wt >60 kg  Plan: -d/c heparin  -Apixaban 10 mg PO BID x 7 days, followed by apixaban 5 mg PO BID until discontinued  -Follow up duration of anticoagulation -monitor for s/sx bleeding -pharmacy to sign off and monitor peripherally - please reconsult as needed.   Thank you,   Carlean Jews, Pharm.D. PGY1 Pharmacy  Resident 2/25/20183:17 PM Pager [phone]

## 2016-09-05 NOTE — Progress Notes (Signed)
PROGRESS NOTE    Caitlin Taylor  [id number] DOB: 21-Dec-1970 DOA: 09/03/2016 PCP: [name], MD   Brief Narrative: 46 yo female with worsening chronic lower extremities edema who presented to [facility] with anasarca and hypoxia. Her D-dimer was elevated at 8.64. CT angiogram of the chest showed B/L pulmonary embolism. Doppler ultrasound positive for right lower extremity DVT. Currently on IV heparin.  Assessment & Plan:  # Bilateral pulmonary embolism and acute right lower extremity DVT: -Continue IV heparin with plan to switch to oral anticoagulants. I discussed with the patient regarding various oral anticoagulants patient prefers newer agent. I will consult pharmacist for initiation of Eliquis. I will also consult case manager to evaluate for outpatient prescription medications. -Echocardiogram with normal right ventricular function. Left ventricular EF of 65-70%. -Patient is not hypoxic.  #Bilateral lower extremities edema, chronic in nature: -UA with possible UTI, has 30 protein. -Waiting for 24-hour protein creatinine ratio -Currently on IV Lasix 40 twice a day. -BNP only 54 and it echo unremarkable therefore less likely CHF. -Albumin level of 3.5 therefore known nephrotic syndrome. -Low salt diet advised.  #Hypertension: Blood pressure is suboptimally controlled. Patient also with tachycardia. Continue Aldactone, lisinopril and Lasix. I will add low-dose metoprolol.  #Urinary tract infection, likely acute cystitis without hematuria: Urine culture growing Escherichia coli. Continue ceftriaxone.  # Incidental thyroid nodule-  2.4 cm exophytic nodule arising at the inferior aspect of the left thyroid lobe.  Will need outpatient endocrinology evaluation. Also found to have 2.2 cm left adrenal adenoma noted.  #Acute kidney injury on possible CKD stage III: Serum creatinine level improving. Monitor BMP. Avoid nephrotoxins.  DVT prophylaxis: Systemic anticoagulation Code Status:  Full code Family Communication: No family present at bedside Disposition Plan: Likely discharge home in 1-2 days    Consultants:   Pulmonary  Procedures: Echo Antimicrobials: Ceftriaxone  Subjective: Patient was seen and examined at bedside. Reported feeling much better. Denied headache, dizziness, nausea, vomiting, chest pain, shortness of breath. No urinary symptoms.  Objective: Vitals:   09/05/16 0122 09/05/16 0642 09/05/16 0735 09/05/16 1130  BP: 128/65 (!) 137/94 (!) 150/103 (!) 160/102  Pulse: 92 (!) 115    Resp:   17 17  Temp: 98.6 F (37 C) 99 F (37.2 C) 98.1 F (36.7 C) 98.2 F (36.8 C)  TempSrc: Oral Oral Oral Oral  SpO2: 100% 95% 96% 97%  Weight:  125.2 kg (276 lb 1.6 oz)    Height:        Intake/Output Summary (Last 24 hours) at 09/05/16 1448 Last data filed at 09/05/16 1300  Gross per 24 hour  Intake             1099 ml  Output             1400 ml  Net             -301 ml   Filed Weights   09/03/16 2254 09/04/16 0232 09/05/16 0642  Weight: 117.9 kg (260 lb) 125.1 kg (275 lb 11.2 oz) 125.2 kg (276 lb 1.6 oz)    Examination:  General exam: Appears calm and comfortable  Respiratory system: Clear to auscultation. Respiratory effort normal. No wheezing or crackle Cardiovascular system: S1 & S2 heard, Regular tachycardic  Gastrointestinal system: Abdomen is nondistended, soft and nontender. Normal bowel sounds heard. Central nervous system: Alert and oriented. No focal neurological deficits. Extremities: Symmetric 5 x 5 power.Bilateral lower extremities pitting edema. Skin: No rashes, lesions or ulcers Psychiatry: Judgement and insight  appear normal. Mood & affect appropriate.     Data Reviewed: I have personally reviewed following labs and imaging studies  CBC:  Recent Labs Lab 09/03/16 2050 09/05/16 0446  WBC 11.7* 9.3  NEUTROABS 10.1*  --   HGB 12.2 11.5*  HCT 35.9* 34.7*  MCV 90.0 89.0  PLT 158 [id number]   Basic Metabolic Panel:  Recent  Labs Lab 09/03/16 2050 09/04/16 0418 09/05/16 0446  NA 140 142 141  K 3.4* 3.5 3.7  CL 104 105 102  CO2 25 23 26   GLUCOSE 148* 137* 99  BUN 26* 21* 17  CREATININE 1.53* 1.47* 1.39*  CALCIUM 8.7* 8.8* 9.0   GFR: Estimated Creatinine Clearance: 66.3 mL/min (by C-G formula based on SCr of 1.39 mg/dL (H)). Liver Function Tests:  Recent Labs Lab 09/04/16 0418  AST 34  ALT 52  ALKPHOS 42  BILITOT 0.5  PROT 6.3*  ALBUMIN 3.5   No results for input(s): LIPASE, AMYLASE in the last 168 hours. No results for input(s): AMMONIA in the last 168 hours. Coagulation Profile: No results for input(s): INR, PROTIME in the last 168 hours. Cardiac Enzymes:  Recent Labs Lab 09/03/16 2050  TROPONINI <0.03   BNP (last 3 results) No results for input(s): PROBNP in the last 8760 hours. HbA1C: No results for input(s): HGBA1C in the last 72 hours. CBG: No results for input(s): GLUCAP in the last 168 hours. Lipid Profile: No results for input(s): CHOL, HDL, LDLCALC, TRIG, CHOLHDL, LDLDIRECT in the last 72 hours. Thyroid Function Tests:  Recent Labs  09/03/16 2050  TSH 0.451   Anemia Panel: No results for input(s): VITAMINB12, FOLATE, FERRITIN, TIBC, IRON, RETICCTPCT in the last 72 hours. Sepsis Labs: No results for input(s): PROCALCITON, LATICACIDVEN in the last 168 hours.  Recent Results (from the past 240 hour(s))  Urine culture     Status: Abnormal (Preliminary result)   Collection Time: 09/03/16  8:52 PM  Result Value Ref Range Status   Specimen Description URINE, RANDOM  Final   Special Requests NONE  Final   Culture (A)  Final    >=100,000 COLONIES/mL ESCHERICHIA COLI SUSCEPTIBILITIES TO FOLLOW Performed at [facility], [address], [place], [place] [address]    Report Status PENDING  Incomplete  MRSA PCR Screening     Status: None   Collection Time: 09/04/16  2:37 AM  Result Value Ref Range Status   MRSA by PCR NEGATIVE NEGATIVE Final    Comment:          The GeneXpert MRSA Assay (FDA approved for NASAL specimens only), is one component of a comprehensive MRSA colonization surveillance program. It is not intended to diagnose MRSA infection nor to guide or monitor treatment for MRSA infections.   Urine culture     Status: None   Collection Time: 09/04/16  1:09 PM  Result Value Ref Range Status   Specimen Description URINE, CLEAN CATCH  Final   Special Requests NONE  Final   Culture NO GROWTH  Final   Report Status 09/05/2016 FINAL  Final         Radiology Studies: Dg Chest 2 View  Result Date: 09/03/2016 CLINICAL DATA:  Shortness of breath 1 month with cough and chest pain as well as wheezing. EXAM: CHEST  2 VIEW COMPARISON:  None. FINDINGS: Lungs are adequately inflated without focal consolidation or effusion. Cardiomediastinal silhouette is within normal. Remaining bones and soft tissues are within normal. IMPRESSION: No active cardiopulmonary disease. Electronically Signed  By: Marin Olp M.D.   On: 09/03/2016 21:33   Ct Angio Chest Pe W And/or Wo Contrast  Result Date: 09/03/2016 CLINICAL DATA:  Acute onset of shortness of breath with exertion. Bilateral lower extremity swelling. Initial encounter. EXAM: CT ANGIOGRAPHY CHEST WITH CONTRAST TECHNIQUE: Multidetector CT imaging of the chest was performed using the standard protocol during bolus administration of intravenous contrast. Multiplanar CT image reconstructions and MIPs were obtained to evaluate the vascular anatomy. CONTRAST:  80 mL of Isovue 370 IV contrast COMPARISON:  Chest radiograph performed earlier today at 9:20 p.m. FINDINGS: Cardiovascular: There is pulmonary embolus within the right main pulmonary artery, extending into all lobes of the right lung. There is also segmental pulmonary embolus within a pulmonary artery to the left lower lobe. The RV/LV ratio is just above 0.9, corresponding to mild right heart strain and at least submassive pulmonary embolus. The  heart is otherwise unremarkable in appearance. The thoracic aorta is unremarkable. No calcific atherosclerotic disease is seen. The vessels are grossly unremarkable. Mediastinum/Nodes: The mediastinum is otherwise unremarkable in appearance. No mediastinal lymphadenopathy is seen. No pericardial effusion is identified. A 2.4 cm exophytic nodule is seen arising at the inferior aspect of the left thyroid lobe. No axillary lymphadenopathy is seen. Lungs/Pleura: Minimal bibasilar atelectasis is noted. No pleural effusion or pneumothorax is seen. No masses are identified. Upper Abdomen: The visualized portions of the liver and spleen are grossly unremarkable. The visualized portions of the gallbladder, pancreas, right and kidneys are grossly unremarkable. A 2.2 cm left adrenal adenoma is noted. Nonspecific left-sided perinephric stranding is seen. Musculoskeletal: No acute osseous abnormalities are identified. The visualized musculature is unremarkable in appearance. Review of the MIP images confirms the above findings. IMPRESSION: 1. Pulmonary embolus within the right main pulmonary artery, extending into all lobes of the right lung. Segmental pulmonary embolus within the pulmonary artery to the left lower lobe. CT evidence of right heart strain (RV/LV Ratio = 0.91) consistent with at least submassive (intermediate risk) PE. The presence of right heart strain has been associated with an increased risk of morbidity and mortality. Please activate Code PE by paging [phone]. 2. Minimal bibasilar atelectasis noted.  Lungs otherwise clear. 3. **An incidental finding of potential clinical significance has been found. 2.4 cm exophytic nodule arising at the inferior aspect of the left thyroid lobe. Recommend further evaluation with thyroid ultrasound. If patient is clinically hyperthyroid, consider nuclear medicine thyroid uptake and scan.** 4. 2.2 cm left adrenal adenoma noted. Critical Value/emergent results were called  by telephone at the time of interpretation on 09/03/2016 at 11:22 pm to Dr. [name], who verbally acknowledged these results. Electronically Signed   By: Garald Balding M.D.   On: 09/03/2016 23:30        Scheduled Meds: . cefTRIAXone (ROCEPHIN)  IV  1 g Intravenous Q24H  . furosemide  40 mg Intravenous Daily  . Influenza vac split quadrivalent PF  0.5 mL Intramuscular Tomorrow-1000  . lisinopril  10 mg Oral Daily  . sodium chloride flush  3 mL Intravenous Q12H  . spironolactone  50 mg Oral Daily   Continuous Infusions: . heparin 1,200 Units/hr (09/05/16 1436)     LOS: 2 days    Dawnelle Warman Tanna Furry, MD [facility] Pager [phone]  If 7PM-7AM, please contact night-coverage [email or web address] Password [facility] 09/05/2016, 2:48 PM

## 2016-09-06 ENCOUNTER — Encounter (HOSPITAL_COMMUNITY): Payer: Self-pay | Admitting: Student

## 2016-09-06 DIAGNOSIS — R601 Generalized edema: Secondary | ICD-10-CM

## 2016-09-06 DIAGNOSIS — N390 Urinary tract infection, site not specified: Secondary | ICD-10-CM

## 2016-09-06 LAB — BASIC METABOLIC PANEL
Anion gap: 8 (ref 5–15)
BUN: 17 mg/dL (ref 6–20)
CHLORIDE: 103 mmol/L (ref 101–111)
CO2: 28 mmol/L (ref 22–32)
CREATININE: 1.33 mg/dL — AB (ref 0.44–1.00)
Calcium: 8.9 mg/dL (ref 8.9–10.3)
GFR calc Af Amer: 55 mL/min — ABNORMAL LOW (ref >60)
GFR calc non Af Amer: 47 mL/min — ABNORMAL LOW (ref >60)
GLUCOSE: 100 mg/dL — AB (ref 65–99)
POTASSIUM: 3.8 mmol/L (ref 3.5–5.1)
Sodium: 139 mmol/L (ref 135–145)

## 2016-09-06 LAB — CBC
HEMATOCRIT: 34 % — AB (ref 36.0–46.0)
HEMOGLOBIN: 11.2 g/dL — AB (ref 12.0–15.0)
MCH: 29.2 pg (ref 26.0–34.0)
MCHC: 32.9 g/dL (ref 30.0–36.0)
MCV: 88.5 fL (ref 78.0–100.0)
Platelets: 165 10*3/uL (ref 150–400)
RBC: 3.84 MIL/uL — AB (ref 3.87–5.11)
RDW: 13.8 % (ref 11.5–15.5)
WBC: 9.6 10*3/uL (ref 4.0–10.5)

## 2016-09-06 MED ORDER — METOPROLOL TARTRATE 25 MG PO TABS: 12.5000 mg | ORAL_TABLET | Freq: Two times a day (BID) | ORAL | 0 refills | Status: AC

## 2016-09-06 MED ORDER — APIXABAN 5 MG PO TABS: 5.0000 mg | ORAL_TABLET | Freq: Two times a day (BID) | ORAL | 0 refills | Status: DC

## 2016-09-06 MED ORDER — FUROSEMIDE 80 MG PO TABS: 80.0000 mg | ORAL_TABLET | Freq: Every day | ORAL | 0 refills | Status: DC

## 2016-09-06 MED ORDER — METOPROLOL TARTRATE 12.5 MG HALF TABLET
12.5000 mg | ORAL_TABLET | Freq: Two times a day (BID) | ORAL | Status: DC
  Administered 2016-09-06: 12.5 mg via ORAL
  Filled 2016-09-06: qty 1

## 2016-09-06 NOTE — Progress Notes (Signed)
Pt discharged home. Discharge instructions have been gone over with the patient. IV's removed. Pt given unit number and told to call if they have any concerns regarding their discharge instructions. Jaziah Goeller V, RN   

## 2016-09-06 NOTE — Care Management Note (Signed)
Case Management Note  Patient Details  Name: YACKELIN VANBROCKLIN MRN: [id number] Date of Birth: 02-Dec-1970  Subjective/Objective:   Pt presented for anasarca and hypoxia. Positive for PE. Plan will be for d/c home today. Benefits check completed for Eliquis. 30 day free and co pay card provided to patient. CM did call [place] and medication is available.                Action/Plan: No home needs identified.   Expected Discharge Date:  09/06/16               Expected Discharge Plan:  Home/Self Care  In-House Referral:  NA  Discharge planning Services  Medication Assistance, CM Consult  Post Acute Care Choice:  NA Choice offered to:  NA  DME Arranged:  N/A DME Agency:  NA  HH Arranged:  NA HH Agency:  NA  Status of Service:  Completed, signed off  If discussed at [place] of Stay Meetings, dates discussed:    Additional Comments:  Bethena Roys, RN 09/06/2016, 12:59 PM

## 2016-09-06 NOTE — Consult Note (Signed)
Cardiology Consult    Patient ID: Caitlin Taylor MRN: [id number], DOB/AGE: [birth year]   Admit date: 09/03/2016 Date of Consult: 09/06/2016  Primary Physician: [name], MD Reason for Consult: Possible Tachybrady Syndrome Primary Cardiologist: New to [facility] - Dr. Percival Spanish Requesting Provider: Dr. Carolin Sicks   History of Present Illness    Caitlin Taylor is a 46 y.o. female with past medical history of HTN, Stage 2 CKD, and chronic lower extremity edema who presented to [facility] ED on 09/03/2016 for worsening dyspnea.   Reports having orthopnea and worsening dyspnea with exertion for the past 4-6 weeks. She does not exercise regularly but had noticed worsening dyspnea when carrying out daily activities such as walking from her car into work. She denies any associated chest discomfort or palpitations. Reports orthopnea and occasional PND. Has chronic lower extremity edema which has been present for many years (on Lasix and Spironolactone as an outpatient).   Initial labs showed WBC 11.7, Hgb 12.2, and platelets 158. Na+ 140. K+ 3.4. Creatinine 1.53 (at 1.16 in 01/2016). D-dimer 8.64. BNP 51.8. CXR with no active cardiopulmonary disease. EKG showed sinus tachycardia, HR 102. CTA showed a pulmonary embolus within the right main pulmonary artery, extending into all lobes of the right lung with segmental pulmonary embolus within the pulmonary artery to the left lower lobe. CT evidence of right heart strain (RV/LV Ratio = 0.91) consistent with at least submassive (intermediate risk) PE. Incidental finding of 2.4 cm exophytic nodule arising at the inferior aspect of the left thyroid lobe was noted.   She was started on Heparin at time of admission. She has since been switched to Eliquis 10mg  BID. She has been noted to be tachycardiac and hypertensive this admission. She had been continued on Aldactone, Lisinopril, and Lasix. Metoprolol 25mg  BID was added to her regimen on 2/25.   Overnight,  following administration of Lopressor, her HR dropped into the high 30's to 40's with sinus pauses up to 2.68 seconds. Was asymptomatic with this. HR has since improved into the 70's - 90's. Was given Lopressor 12.5mg  at 0809 this AM with HR currently in the 80's - 90's. No evidence of recurrent bradycardia or pauses.   No prior cardiac history. Unaware of family history as patient is adopted. No tobacco use, alcohol use, or recreational drug use.   Past Medical History   Past Medical History:  Diagnosis Date  . Chronic back pain   . Hypertension     Past Surgical History:  Procedure Laterality Date  . CESAREAN SECTION    . MAXIMUM ACCESS (MAS)POSTERIOR LUMBAR INTERBODY FUSION (PLIF) 1 LEVEL N/A 01/06/2016   Procedure: FOR MAXIMUM ACCESS (MAS) POSTERIOR LUMBAR INTERBODY FUSION (PLIF) LUMBAR FOUR-FIVE;  Surgeon: Kevan Ny Ditty, MD;  Location: [facility];  Service: Neurosurgery;  Laterality: N/A;     Allergies  No Known Allergies  Inpatient Medications    . apixaban  10 mg Oral BID   Followed by  . [START ON 09/12/2016] apixaban  5 mg Oral BID  . cefTRIAXone (ROCEPHIN)  IV  1 g Intravenous Q24H  . furosemide  40 mg Intravenous Daily  . Influenza vac split quadrivalent PF  0.5 mL Intramuscular Tomorrow-1000  . lisinopril  10 mg Oral Daily  . metoprolol tartrate  12.5 mg Oral BID  . sodium chloride flush  3 mL Intravenous Q12H  . spironolactone  50 mg Oral Daily    Family History    Family History  Problem Relation Age  of Onset  . Congestive Heart Failure Father    UNKNOWN - PATIENT IS ADOPTED.  Social History    Social History   Social History  . Marital status: Married    Spouse name: N/A  . Number of children: N/A  . Years of education: N/A   Occupational History  . Not on file.   Social History Main Topics  . Smoking status: Never Smoker  . Smokeless tobacco: Never Used  . Alcohol use No  . Drug use: No  . Sexual activity: Not on file   Other  Topics Concern  . Not on file   Social History Narrative  . No narrative on file     Review of Systems    General:  No chills, fever, night sweats or weight changes.  Cardiovascular:  No chest pain, palpitations, paroxysmal nocturnal dyspnea. Positive for dyspnea with exertion, orthopnea, and lower extremity edema.  Dermatological: No rash, lesions/masses Respiratory: No cough, Positive for dyspnea Urologic: No hematuria, dysuria Abdominal:   No nausea, vomiting, diarrhea, bright red blood per rectum, melena, or hematemesis Neurologic:  No visual changes, wkns, changes in mental status. All other systems reviewed and are otherwise negative except as noted above.  Physical Exam    Blood pressure 128/82, pulse 77, temperature 98.6 F (37 C), temperature source Oral, resp. rate 15, height 5' 3.5" (1.613 m), weight 274 lb 9.6 oz (124.6 kg), last menstrual period 08/20/2016, SpO2 97 %.  General: Pleasant, overweight African American female appearing in NAD Psych: Normal affect. Neuro: Alert and oriented X 3. Moves all extremities spontaneously. HEENT: Normal  Neck: Supple without bruits or JVD. Lungs:  Resp regular and unlabored, CTA without wheezing or rales. Heart: RRR no s3, s4, or murmurs. Abdomen: Soft, non-tender, non-distended, BS + x 4.  Extremities: No clubbing or cyanosis. Chronic lower extremity edema. DP/PT/Radials 2+ and equal bilaterally.  Labs    Troponin (Point of Care Test) No results for input(s): TROPIPOC in the last 72 hours.  Recent Labs  09/03/16 2050  TROPONINI <0.03   Lab Results  Component Value Date   WBC 9.6 09/06/2016   HGB 11.2 (L) 09/06/2016   HCT 34.0 (L) 09/06/2016   MCV 88.5 09/06/2016   PLT 165 09/06/2016    Recent Labs Lab 09/04/16 0418  09/06/16 0348  NA 142  < > 139  K 3.5  < > 3.8  CL 105  < > 103  CO2 23  < > 28  BUN 21*  < > 17  CREATININE 1.47*  < > 1.33*  CALCIUM 8.8*  < > 8.9  PROT 6.3*  --   --   BILITOT 0.5  --   --    ALKPHOS 42  --   --   ALT 52  --   --   AST 34  --   --   GLUCOSE 137*  < > 100*  < > = values in this interval not displayed. No results found for: CHOL, HDL, LDLCALC, TRIG Lab Results  Component Value Date   DDIMER 8.64 (H) 09/03/2016     Radiology Studies    Dg Chest 2 View  Result Date: 09/03/2016 CLINICAL DATA:  Shortness of breath 1 month with cough and chest pain as well as wheezing. EXAM: CHEST  2 VIEW COMPARISON:  None. FINDINGS: Lungs are adequately inflated without focal consolidation or effusion. Cardiomediastinal silhouette is within normal. Remaining bones and soft tissues are within normal. IMPRESSION: No active cardiopulmonary disease.  Electronically Signed   By: Marin Olp M.D.   On: 09/03/2016 21:33   Ct Angio Chest Pe W And/or Wo Contrast  Result Date: 09/03/2016 CLINICAL DATA:  Acute onset of shortness of breath with exertion. Bilateral lower extremity swelling. Initial encounter. EXAM: CT ANGIOGRAPHY CHEST WITH CONTRAST TECHNIQUE: Multidetector CT imaging of the chest was performed using the standard protocol during bolus administration of intravenous contrast. Multiplanar CT image reconstructions and MIPs were obtained to evaluate the vascular anatomy. CONTRAST:  80 mL of Isovue 370 IV contrast COMPARISON:  Chest radiograph performed earlier today at 9:20 p.m. FINDINGS: Cardiovascular: There is pulmonary embolus within the right main pulmonary artery, extending into all lobes of the right lung. There is also segmental pulmonary embolus within a pulmonary artery to the left lower lobe. The RV/LV ratio is just above 0.9, corresponding to mild right heart strain and at least submassive pulmonary embolus. The heart is otherwise unremarkable in appearance. The thoracic aorta is unremarkable. No calcific atherosclerotic disease is seen. The vessels are grossly unremarkable. Mediastinum/Nodes: The mediastinum is otherwise unremarkable in appearance. No mediastinal  lymphadenopathy is seen. No pericardial effusion is identified. A 2.4 cm exophytic nodule is seen arising at the inferior aspect of the left thyroid lobe. No axillary lymphadenopathy is seen. Lungs/Pleura: Minimal bibasilar atelectasis is noted. No pleural effusion or pneumothorax is seen. No masses are identified. Upper Abdomen: The visualized portions of the liver and spleen are grossly unremarkable. The visualized portions of the gallbladder, pancreas, right and kidneys are grossly unremarkable. A 2.2 cm left adrenal adenoma is noted. Nonspecific left-sided perinephric stranding is seen. Musculoskeletal: No acute osseous abnormalities are identified. The visualized musculature is unremarkable in appearance. Review of the MIP images confirms the above findings. IMPRESSION: 1. Pulmonary embolus within the right main pulmonary artery, extending into all lobes of the right lung. Segmental pulmonary embolus within the pulmonary artery to the left lower lobe. CT evidence of right heart strain (RV/LV Ratio = 0.91) consistent with at least submassive (intermediate risk) PE. The presence of right heart strain has been associated with an increased risk of morbidity and mortality. Please activate Code PE by paging [phone]. 2. Minimal bibasilar atelectasis noted.  Lungs otherwise clear. 3. **An incidental finding of potential clinical significance has been found. 2.4 cm exophytic nodule arising at the inferior aspect of the left thyroid lobe. Recommend further evaluation with thyroid ultrasound. If patient is clinically hyperthyroid, consider nuclear medicine thyroid uptake and scan.** 4. 2.2 cm left adrenal adenoma noted. Critical Value/emergent results were called by telephone at the time of interpretation on 09/03/2016 at 11:22 pm to Dr. [name], who verbally acknowledged these results. Electronically Signed   By: Garald Balding M.D.   On: 09/03/2016 23:30    EKG & Cardiac Imaging    EKG:  Sinus  tachycardia, HR 102 - Personally Reviewed  Echocardiogram:  09/04/2016 Study Conclusions  - Left ventricle: The cavity size was normal. Wall thickness was   normal. Systolic function was vigorous. The estimated ejection   fraction was in the range of 65% to 70%. Wall motion was normal;   there were no regional wall motion abnormalities. Doppler   parameters are consistent with abnormal left ventricular   relaxation (grade 1 diastolic dysfunction). - Aortic valve: Mildly calcified annulus. Mildly thickened   leaflets. Mean gradient (S): 6 mm Hg. Valve area (VTI): 3.57   cm^2. Valve area (Vmax): 2.81 cm^2. - Technically difficult study. Echocontrast was used to enhance  visualization.  Assessment & Plan    1. Sinus bradycardia/Pauses - admitted with worsening dyspnea with exertion for the past 4-6 weeks. No associated chest discomfort or palpitations. Reports orthopnea, occasional PND, and lower extremity edema. Found to have bilateral PE and right DVT.  -  Metoprolol 25mg  BID was added to her regimen on 2/25 due to palpitations and tachycardia. Following administration of this, her HR dropped into the high 30's to 40's with sinus pauses up to 2.68 seconds. Asymptomatic and sleeping throughout this.  - HR has since improved into the 70's - 90's. Was given Lopressor 12.5mg  at 0809 this AM with HR currently in the 80's - 90's. No evidence of recurrent bradycardia or pauses. Could continue with Lopressor 12.5mg  BID at this time as she is symptomatic with her tachycardia in the setting of acute PE. As PE improves and HR normalizes, this can likely be discontinued. Would avoid further titration of BB.    2. HTN - BP at 122/80 - 164/105 in the past 24 hours. - continue Lasix, Lisinopril, and Lopressor.   3. Stage 2 CKD - creatinine 1.53 on admission, at 1.33 this AM.  4. Bilateral PE - CTA on admission showed a pulmonary embolus within the right main pulmonary artery, extending into all  lobes of the right lung with segmental pulmonary embolus within the pulmonary artery to the left lower lobe. CT evidence of right heart strain (RV/LV Ratio = 0.91) consistent with at least submassive (intermediate risk) PE.  - initially on Heparin --> transitioned to Eliquis.   5. Thyroid Nodule - Incidental finding of 2.4 cm exophytic nodule arising at the inferior aspect of the left thyroid lobe appreciated on CT this admission. - recommend follow-up as an outpatient.   Signed, Erma Heritage, PA-C 09/06/2016, 8:48 AM Pager: [phone]  History and all data above reviewed.  Patient examined.  I agree with the findings as above.  This pleasant patient presents with DOE progressive over weeks who is found to have PE.  She also has chronic lower extremity swelling.  She has normal LV systolic function.  We are called because of bradycardia after taking metoprolol.   The patient exam reveals COR:RRR  ,  Lungs: Clear  ,  Abd: Positive bowel sounds, no rebound no guarding, Ext Mild bilateral lower extremity edema  .  All available labs, radiology testing, previous records reviewed. Agree with documented assessment and plan.  Bradycardia:  Related likely to sleep apnea which should be evaluated by her PCP as an out patient and the beta blocker.  She is now tolerating a lower dose of beta blocker.  However, we are likely to see nighttime bradycardia with or without a beta blocker if she has significant sleep apnea.  No further cardiac work up or treatment is needed at this time.    Jeneen Rinks Jonael Paradiso  11:49 AM  09/06/2016

## 2016-09-06 NOTE — Discharge Summary (Addendum)
Physician Discharge Summary  Caitlin Taylor [id number] DOB: [birth year] DOA: 09/03/2016  PCP: [name], MD  Admit date: 09/03/2016 Discharge date: 09/06/2016  Admitted From:Home Disposition:Home  Recommendations for Outpatient Follow-up:  1. Follow up with PCP in 1-2 weeks 2. Please obtain BMP/CBC in one week  Home Health:no Equipment/Devices:no Discharge Condition:stable CODE STATUS:full Diet recommendation:Heart healthy/low salt diet.  Brief/Interim Summary:46 yo female with worsening chronic lower extremities edema who presented to [facility] with anasarca and hypoxia. Her D-dimer was elevated at 8.64. CT angiogram of the chest showed B/L pulmonary embolism. Doppler ultrasound positive for right lower extremity DVT  # Bilateral pulmonary embolism and acute right lower extremity DVT: -Initiated treated with IV heparin and later switched to oral Eliquis for systemic anticoagulantion. I discussed with the patient regarding new medications and she verbalized understanding. I advised her to [place] for any sign of bleeding. I recommended her to discuss with her PCP for possible hematology referral. Patient clinically improved. She is not hypoxic. -Echocardiogram with normal right ventricular function. Left ventricular EF of 65-70%.   #Bilateral lower extremities edema, chronic in nature: -UA with possible UTI, has 30 protein. -Treated with IV Lasix with improvement in lower extremity edema. -BNP only 54 and it echo unremarkable therefore less likely CHF. -Albumin level of 3.5 therefore known nephrotic syndrome. -Low salt diet advised. -Discharged with oral Lasix 80 mg daily. Denied discharge Lasix tonight because of poor sleep cycle. I advised patient to take low-salt diet, daily weight and home. I advised her to monitor CBC and renal function in a week with PCP.  #Hypertension: Continue current medication. Diuretics adjusted. Added metoprolol for tachycardia. Noted that patient was  bradycardic while sleeping last night. Now heart rate is around 80s. The dose of metoprolol reduced to 12.5 twice a day. I advised patient to monitor heart rate at home. Cardiology was consulted. Recommended to continue current dose of metoprolol. The patient does not have chest pain.  #Urinary tract infection, likely acute cystitis without hematuria: Urine culture growing Escherichia coli. Received 3 days of ceftriaxone. Clinically improved.  # Incidental thyroid nodule-2.4 cm exophytic nodule arising at the inferior aspect of the left thyroid lobe.  Will need outpatient endocrinology evaluation. Also found to have2.2 cm left adrenal adenoma noted.  #Acute kidney injury on possible CKD stage II: Serum creatinine level improving. Monitor BMP.   Patient clinically improved. Lower extremity edema improved. Anticoagulation switched to oral. Patient does not have chest pain, shortness of breath,. Patient verbalized understanding of follow-up instruction. She reported that she has follow-up with her PCP on coming Friday, after 4 days.  Patient likely has sleep apnea recommended outpatient follow-up with PCP.  Discharge Diagnoses:  Principal Problem:   Bilateral pulmonary embolism (HCC) Active Problems:   CKD (chronic kidney disease) stage 2, GFR 60-89 ml/min   Acute lower UTI   Benign essential HTN   Edema   Acute cystitis without hematuria   Acute deep vein thrombosis (DVT) of proximal vein of right lower extremity [facility])    Discharge Instructions  Discharge Instructions    Call MD for:  difficulty breathing, headache or visual disturbances    Complete by:  As directed    Call MD for:  extreme fatigue    Complete by:  As directed    Call MD for:  hives    Complete by:  As directed    Call MD for:  persistant dizziness or light-headedness    Complete by:  As directed    Call  MD for:  persistant nausea and vomiting    Complete by:  As directed    Call MD for:  severe  uncontrolled pain    Complete by:  As directed    Call MD for:  temperature >100.4    Complete by:  As directed    Diet - low sodium heart healthy    Complete by:  As directed    Discharge instructions    Complete by:  As directed    1)Please check CBC, BMP in one week and follow up with your PCP.  2)Please discuss with your PCP for possible hematology referral for the DVT/PE  3) Please watch for any sign of bleeding 4) please follow up with PCP and endocrinologist for the evaluation of 2.4 cm exophytic nodule arising at the inferior aspect of the left thyroid lobe. Also found to have2.2 cm left adrenal adenoma noted.   Increase activity slowly    Complete by:  As directed      Allergies as of 09/06/2016   No Known Allergies     Medication List    STOP taking these medications   lisinopril 10 MG tablet Commonly known as:  PRINIVIL,ZESTRIL     TAKE these medications   apixaban 5 MG Tabs tablet Commonly known as:  ELIQUIS Take 1 tablet (5 mg total) by mouth 2 (two) times daily. Please take 10 mg twice a day for 7 days and then 5 mg twice a day Start taking on:  09/12/2016   ascorbic acid 250 MG tablet Commonly known as:  VITAMIN C Take 1 tablet (250 mg total) by mouth daily with breakfast.   diphenhydramine-acetaminophen 25-500 MG Tabs tablet Commonly known as:  TYLENOL PM Take 1 tablet by mouth at bedtime as needed (pain).   furosemide 80 MG tablet Commonly known as:  LASIX Take 1 tablet (80 mg total) by mouth daily. What changed:  when to take this  Another medication with the same name was removed. Continue taking this medication, and follow the directions you see here.   lisinopril-hydrochlorothiazide 20-25 MG tablet Commonly known as:  PRINZIDE,ZESTORETIC Take 1 tablet by mouth daily.   metoprolol tartrate 25 MG tablet Commonly known as:  LOPRESSOR Take 0.5 tablets (12.5 mg total) by mouth 2 (two) times daily.   MONONESSA 0.25-35 MG-MCG tablet Generic drug:   norgestimate-ethinyl estradiol Take 1 tablet by mouth daily.   spironolactone 25 MG tablet Commonly known as:  ALDACTONE Take 25 mg by mouth daily.   Vitamin D (Ergocalciferol) 50000 units Caps capsule Commonly known as:  DRISDOL Take 50,000 Units by mouth every Monday.      Follow-up Information    MOREIRA,ROY, MD. Schedule an appointment as soon as possible for a visit in 1 week(s).   Specialty:  Internal Medicine Contact information: [id number] [phone]          No Known Allergies  Consultations: Pulmonologist  Procedures/Studies: Echocardiogram  Subjective: Patient was seen and examined at bedside. Patient reported feeling much better. She denied headache, dizziness, nausea, vomiting, chest pain, shortness of breath, abdominal pain.  Discharge Exam: Vitals:   09/06/16 0604 09/06/16 0749  BP: 138/85 128/82  Pulse: 66 77  Resp:  15  Temp: 98.4 F (36.9 C) 98.6 F (37 C)   Vitals:   09/05/16 2100 09/06/16 0039 09/06/16 0604 09/06/16 0749  BP: (!) 164/84 122/80 138/85 128/82  Pulse: 74 63 66 77  Resp: 20   15  Temp:  98 F (36.7 C) 99.4 F (37.4 C) 98.4 F (36.9 C) 98.6 F (37 C)  TempSrc: Oral Oral Oral Oral  SpO2: 98% 96% 94% 97%  Weight:   124.6 kg (274 lb 9.6 oz)   Height:        General: Pt is alert, awake, not in acute distress Cardiovascular: RRR, S1/S2 +, no rubs, no gallops Respiratory: CTA bilaterally, no wheezing, no rhonchi Abdominal: Soft, NT, ND, bowel sounds + Extremities: Lower extremities pitting edema significantly improved. Still has some edema. Neurology: Alert, awake, and's, muscle strength 5 over 5 in all extremities.   The results of significant diagnostics from this hospitalization (including imaging, microbiology, ancillary and laboratory) are listed below for reference.     Microbiology: Recent Results (from the past 240 hour(s))  Urine culture     Status: Abnormal (Preliminary result)    Collection Time: 09/03/16  8:52 PM  Result Value Ref Range Status   Specimen Description URINE, RANDOM  Final   Special Requests NONE  Final   Culture (A)  Final    >=100,000 COLONIES/mL ESCHERICHIA COLI SUSCEPTIBILITIES TO FOLLOW Performed at [facility], [address] [place], [place] [address]    Report Status PENDING  Incomplete  MRSA PCR Screening     Status: None   Collection Time: 09/04/16  2:37 AM  Result Value Ref Range Status   MRSA by PCR NEGATIVE NEGATIVE Final    Comment:        The GeneXpert MRSA Assay (FDA approved for NASAL specimens only), is one component of a comprehensive MRSA colonization surveillance program. It is not intended to diagnose MRSA infection nor to guide or monitor treatment for MRSA infections.   Urine culture     Status: None   Collection Time: 09/04/16  1:09 PM  Result Value Ref Range Status   Specimen Description URINE, CLEAN CATCH  Final   Special Requests NONE  Final   Culture NO GROWTH  Final   Report Status 09/05/2016 FINAL  Final     Labs: BNP (last 3 results)  Recent Labs  09/03/16 2052  BNP 0000000   Basic Metabolic Panel:  Recent Labs Lab 09/03/16 2050 09/04/16 0418 09/05/16 0446 09/06/16 0348  NA 140 142 141 139  K 3.4* 3.5 3.7 3.8  CL 104 105 102 103  CO2 25 23 26 28   GLUCOSE 148* 137* 99 100*  BUN 26* 21* 17 17  CREATININE 1.53* 1.47* 1.39* 1.33*  CALCIUM 8.7* 8.8* 9.0 8.9   Liver Function Tests:  Recent Labs Lab 09/04/16 0418  AST 34  ALT 52  ALKPHOS 42  BILITOT 0.5  PROT 6.3*  ALBUMIN 3.5   No results for input(s): LIPASE, AMYLASE in the last 168 hours. No results for input(s): AMMONIA in the last 168 hours. CBC:  Recent Labs Lab 09/03/16 2050 09/05/16 0446 09/06/16 0348  WBC 11.7* 9.3 9.6  NEUTROABS 10.1*  --   --   HGB 12.2 11.5* 11.2*  HCT 35.9* 34.7* 34.0*  MCV 90.0 89.0 88.5  PLT 158 157 165   Cardiac Enzymes:  Recent Labs Lab 09/03/16 2050  TROPONINI <0.03    BNP: Invalid input(s): POCBNP CBG: No results for input(s): GLUCAP in the last 168 hours. D-Dimer  Recent Labs  09/03/16 2050  DDIMER 8.64*   Hgb A1c No results for input(s): HGBA1C in the last 72 hours. Lipid Profile No results for input(s): CHOL, HDL, LDLCALC, TRIG, CHOLHDL, LDLDIRECT in the last 72 hours.  Thyroid function studies  Recent Labs  09/03/16 2050  TSH 0.451   Anemia work up No results for input(s): VITAMINB12, FOLATE, FERRITIN, TIBC, IRON, RETICCTPCT in the last 72 hours. Urinalysis    Component Value Date/Time   COLORURINE YELLOW 09/03/2016 2052   APPEARANCEUR TURBID (A) 09/03/2016 2052   LABSPEC 1.016 09/03/2016 2052   PHURINE 5.5 09/03/2016 2052   GLUCOSEU NEGATIVE 09/03/2016 2052   HGBUR LARGE (A) 09/03/2016 2052   BILIRUBINUR NEGATIVE 09/03/2016 2052   KETONESUR NEGATIVE 09/03/2016 2052   PROTEINUR 30 (A) 09/03/2016 2052   NITRITE POSITIVE (A) 09/03/2016 2052   LEUKOCYTESUR LARGE (A) 09/03/2016 2052   Sepsis Labs Invalid input(s): PROCALCITONIN,  WBC,  LACTICIDVEN Microbiology Recent Results (from the past 240 hour(s))  Urine culture     Status: Abnormal (Preliminary result)   Collection Time: 09/03/16  8:52 PM  Result Value Ref Range Status   Specimen Description URINE, RANDOM  Final   Special Requests NONE  Final   Culture (A)  Final    >=100,000 COLONIES/mL ESCHERICHIA COLI SUSCEPTIBILITIES TO FOLLOW Performed at [facility], [address], [place],  [address]    Report Status PENDING  Incomplete  MRSA PCR Screening     Status: None   Collection Time: 09/04/16  2:37 AM  Result Value Ref Range Status   MRSA by PCR NEGATIVE NEGATIVE Final    Comment:        The GeneXpert MRSA Assay (FDA approved for NASAL specimens only), is one component of a comprehensive MRSA colonization surveillance program. It is not intended to diagnose MRSA infection nor to guide or monitor treatment for MRSA infections.   Urine  culture     Status: None   Collection Time: 09/04/16  1:09 PM  Result Value Ref Range Status   Specimen Description URINE, CLEAN CATCH  Final   Special Requests NONE  Final   Culture NO GROWTH  Final   Report Status 09/05/2016 FINAL  Final     Time coordinating discharge: 28 minutes  SIGNED:   Rosita Fire, MD  [facility] 09/06/2016, 11:54 AM  If 7PM-7AM, please contact night-coverage [email or web address] Password [email or web address]

## 2016-09-06 NOTE — Progress Notes (Signed)
Pt heart rate dropped to 38 nonsustained with 2.65 sinus pause while sleeping.  Pt awakened and denied any c/o.  Will cont to monitor closely.

## 2016-09-07 LAB — URINE CULTURE

## 2016-09-08 LAB — ALDOSTERONE: Aldosterone: 6 ng/dL (ref 0.0–30.0)

## 2016-09-14 ENCOUNTER — Other Ambulatory Visit: Payer: Self-pay | Admitting: Internal Medicine

## 2016-09-14 DIAGNOSIS — E041 Nontoxic single thyroid nodule: Secondary | ICD-10-CM

## 2016-09-21 ENCOUNTER — Ambulatory Visit
Admission: RE | Admit: 2016-09-21 | Discharge: 2016-09-21 | Disposition: A | Discharge status: Home / Self Care | Payer: 59 | Source: Ambulatory Visit | Attending: Internal Medicine | Admitting: Internal Medicine

## 2016-09-21 DIAGNOSIS — E041 Nontoxic single thyroid nodule: Secondary | ICD-10-CM

## 2016-09-22 ENCOUNTER — Emergency Department (HOSPITAL_BASED_OUTPATIENT_CLINIC_OR_DEPARTMENT_OTHER): Payer: 59

## 2016-09-22 ENCOUNTER — Inpatient Hospital Stay (HOSPITAL_BASED_OUTPATIENT_CLINIC_OR_DEPARTMENT_OTHER)
Admission: EM | Admit: 2016-09-22 | Discharge: 2016-09-30 | DRG: 579 | Disposition: A | Discharge status: Home Health | Payer: 59 | Attending: Internal Medicine | Admitting: Internal Medicine

## 2016-09-22 ENCOUNTER — Encounter (HOSPITAL_BASED_OUTPATIENT_CLINIC_OR_DEPARTMENT_OTHER): Payer: Self-pay | Admitting: Emergency Medicine

## 2016-09-22 DIAGNOSIS — N289 Disorder of kidney and ureter, unspecified: Secondary | ICD-10-CM

## 2016-09-22 DIAGNOSIS — N390 Urinary tract infection, site not specified: Secondary | ICD-10-CM | POA: Diagnosis present

## 2016-09-22 DIAGNOSIS — Z8249 Family history of ischemic heart disease and other diseases of the circulatory system: Secondary | ICD-10-CM | POA: Diagnosis not present

## 2016-09-22 DIAGNOSIS — E041 Nontoxic single thyroid nodule: Secondary | ICD-10-CM | POA: Diagnosis present

## 2016-09-22 DIAGNOSIS — Z79899 Other long term (current) drug therapy: Secondary | ICD-10-CM | POA: Diagnosis not present

## 2016-09-22 DIAGNOSIS — I130 Hypertensive heart and chronic kidney disease with heart failure and stage 1 through stage 4 chronic kidney disease, or unspecified chronic kidney disease: Secondary | ICD-10-CM | POA: Diagnosis present

## 2016-09-22 DIAGNOSIS — M793 Panniculitis, unspecified: Principal | ICD-10-CM | POA: Diagnosis present

## 2016-09-22 DIAGNOSIS — R3129 Other microscopic hematuria: Secondary | ICD-10-CM | POA: Diagnosis present

## 2016-09-22 DIAGNOSIS — I5032 Chronic diastolic (congestive) heart failure: Secondary | ICD-10-CM

## 2016-09-22 DIAGNOSIS — Z86711 Personal history of pulmonary embolism: Secondary | ICD-10-CM | POA: Diagnosis not present

## 2016-09-22 DIAGNOSIS — D3500 Benign neoplasm of unspecified adrenal gland: Secondary | ICD-10-CM | POA: Diagnosis present

## 2016-09-22 DIAGNOSIS — L03818 Cellulitis of other sites: Secondary | ICD-10-CM

## 2016-09-22 DIAGNOSIS — I82401 Acute embolism and thrombosis of unspecified deep veins of right lower extremity: Secondary | ICD-10-CM | POA: Diagnosis present

## 2016-09-22 DIAGNOSIS — N183 Chronic kidney disease, stage 3 unspecified: Secondary | ICD-10-CM | POA: Diagnosis present

## 2016-09-22 DIAGNOSIS — T368X5A Adverse effect of other systemic antibiotics, initial encounter: Secondary | ICD-10-CM | POA: Diagnosis present

## 2016-09-22 DIAGNOSIS — D573 Sickle-cell trait: Secondary | ICD-10-CM | POA: Diagnosis present

## 2016-09-22 DIAGNOSIS — L02211 Cutaneous abscess of abdominal wall: Secondary | ICD-10-CM | POA: Diagnosis present

## 2016-09-22 DIAGNOSIS — I2699 Other pulmonary embolism without acute cor pulmonale: Secondary | ICD-10-CM | POA: Diagnosis present

## 2016-09-22 DIAGNOSIS — D649 Anemia, unspecified: Secondary | ICD-10-CM | POA: Diagnosis not present

## 2016-09-22 DIAGNOSIS — Z6841 Body Mass Index (BMI) 40.0 and over, adult: Secondary | ICD-10-CM | POA: Diagnosis not present

## 2016-09-22 DIAGNOSIS — E869 Volume depletion, unspecified: Secondary | ICD-10-CM | POA: Diagnosis present

## 2016-09-22 DIAGNOSIS — T464X5A Adverse effect of angiotensin-converting-enzyme inhibitors, initial encounter: Secondary | ICD-10-CM | POA: Diagnosis present

## 2016-09-22 DIAGNOSIS — R809 Proteinuria, unspecified: Secondary | ICD-10-CM | POA: Diagnosis present

## 2016-09-22 DIAGNOSIS — I824Y1 Acute embolism and thrombosis of unspecified deep veins of right proximal lower extremity: Secondary | ICD-10-CM | POA: Diagnosis not present

## 2016-09-22 DIAGNOSIS — N170 Acute kidney failure with tubular necrosis: Secondary | ICD-10-CM | POA: Diagnosis present

## 2016-09-22 DIAGNOSIS — Z7901 Long term (current) use of anticoagulants: Secondary | ICD-10-CM | POA: Diagnosis not present

## 2016-09-22 DIAGNOSIS — N179 Acute kidney failure, unspecified: Secondary | ICD-10-CM | POA: Diagnosis not present

## 2016-09-22 DIAGNOSIS — M549 Dorsalgia, unspecified: Secondary | ICD-10-CM | POA: Diagnosis present

## 2016-09-22 DIAGNOSIS — L0390 Cellulitis, unspecified: Secondary | ICD-10-CM

## 2016-09-22 DIAGNOSIS — I10 Essential (primary) hypertension: Secondary | ICD-10-CM | POA: Diagnosis not present

## 2016-09-22 DIAGNOSIS — R1030 Lower abdominal pain, unspecified: Secondary | ICD-10-CM | POA: Diagnosis present

## 2016-09-22 DIAGNOSIS — L03311 Cellulitis of abdominal wall: Secondary | ICD-10-CM | POA: Diagnosis present

## 2016-09-22 HISTORY — DX: Spinal stenosis, lumbar region without neurogenic claudication: M48.061

## 2016-09-22 HISTORY — DX: Acute embolism and thrombosis of unspecified deep veins of unspecified lower extremity: I82.409

## 2016-09-22 HISTORY — DX: Chronic kidney disease, stage 2 (mild): N18.2

## 2016-09-22 LAB — CBC WITH DIFFERENTIAL/PLATELET
BASOS ABS: 0 10*3/uL (ref 0.0–0.1)
BASOS PCT: 0 %
Band Neutrophils: 1 %
EOS PCT: 0 %
Eosinophils Absolute: 0 10*3/uL (ref 0.0–0.7)
HEMATOCRIT: 33.4 % — AB (ref 36.0–46.0)
HEMOGLOBIN: 11.3 g/dL — AB (ref 12.0–15.0)
LYMPHS ABS: 0.6 10*3/uL — AB (ref 0.7–4.0)
Lymphocytes Relative: 5 %
MCH: 30.1 pg (ref 26.0–34.0)
MCHC: 33.8 g/dL (ref 30.0–36.0)
MCV: 88.8 fL (ref 78.0–100.0)
MONOS PCT: 9 %
Monocytes Absolute: 1.1 10*3/uL — ABNORMAL HIGH (ref 0.1–1.0)
NEUTROS PCT: 85 %
Neutro Abs: 10.9 10*3/uL — ABNORMAL HIGH (ref 1.7–7.7)
Platelets: 156 10*3/uL (ref 150–400)
RBC: 3.76 MIL/uL — ABNORMAL LOW (ref 3.87–5.11)
RDW: 13.6 % (ref 11.5–15.5)
WBC: 12.6 10*3/uL — ABNORMAL HIGH (ref 4.0–10.5)

## 2016-09-22 LAB — PREGNANCY, URINE: PREG TEST UR: NEGATIVE

## 2016-09-22 LAB — HEPARIN LEVEL (UNFRACTIONATED)

## 2016-09-22 LAB — BASIC METABOLIC PANEL
ANION GAP: 9 (ref 5–15)
BUN: 25 mg/dL — ABNORMAL HIGH (ref 6–20)
CHLORIDE: 101 mmol/L (ref 101–111)
CO2: 27 mmol/L (ref 22–32)
Calcium: 9.1 mg/dL (ref 8.9–10.3)
Creatinine, Ser: 2.06 mg/dL — ABNORMAL HIGH (ref 0.44–1.00)
GFR calc non Af Amer: 28 mL/min — ABNORMAL LOW (ref >60)
GFR, EST AFRICAN AMERICAN: 32 mL/min — AB (ref >60)
Glucose, Bld: 113 mg/dL — ABNORMAL HIGH (ref 65–99)
POTASSIUM: 3.6 mmol/L (ref 3.5–5.1)
SODIUM: 137 mmol/L (ref 135–145)

## 2016-09-22 LAB — APTT: aPTT: 33 seconds (ref 24–36)

## 2016-09-22 MED ORDER — MORPHINE SULFATE (PF) 4 MG/ML IV SOLN
4.0000 mg | Freq: Once | INTRAVENOUS | Status: AC
  Administered 2016-09-22: 4 mg via INTRAVENOUS
  Filled 2016-09-22: qty 1

## 2016-09-22 MED ORDER — SODIUM CHLORIDE 0.9 % IV BOLUS (SEPSIS)
1000.0000 mL | Freq: Once | INTRAVENOUS | Status: AC
  Administered 2016-09-22: 1000 mL via INTRAVENOUS

## 2016-09-22 MED ORDER — SODIUM CHLORIDE 0.9 % IV SOLN
INTRAVENOUS | Status: AC
  Administered 2016-09-22: 20:00:00 via INTRAVENOUS

## 2016-09-22 MED ORDER — ONDANSETRON HCL 4 MG/2ML IJ SOLN
4.0000 mg | Freq: Four times a day (QID) | INTRAMUSCULAR | Status: DC | PRN
  Administered 2016-09-27 (×2): 4 mg via INTRAVENOUS
  Filled 2016-09-22 (×2): qty 2

## 2016-09-22 MED ORDER — HEPARIN (PORCINE) IN NACL 100-0.45 UNIT/ML-% IJ SOLN
1550.0000 [IU]/h | INTRAMUSCULAR | Status: DC
  Administered 2016-09-22: 1600 [IU]/h via INTRAVENOUS
  Filled 2016-09-22 (×2): qty 250

## 2016-09-22 MED ORDER — METOPROLOL TARTRATE 12.5 MG HALF TABLET
12.5000 mg | ORAL_TABLET | Freq: Two times a day (BID) | ORAL | Status: DC
  Administered 2016-09-22 – 2016-09-30 (×13): 12.5 mg via ORAL
  Filled 2016-09-22 (×15): qty 1

## 2016-09-22 MED ORDER — ONDANSETRON HCL 4 MG/2ML IJ SOLN
4.0000 mg | Freq: Once | INTRAMUSCULAR | Status: AC
  Administered 2016-09-22: 4 mg via INTRAVENOUS
  Filled 2016-09-22: qty 2

## 2016-09-22 MED ORDER — ACETAMINOPHEN 650 MG RE SUPP: 650.0000 mg | Freq: Four times a day (QID) | RECTAL | Status: DC | PRN

## 2016-09-22 MED ORDER — VANCOMYCIN HCL 10 G IV SOLR
2000.0000 mg | Freq: Once | INTRAVENOUS | Status: AC
  Administered 2016-09-22: 2000 mg via INTRAVENOUS
  Filled 2016-09-22: qty 2000

## 2016-09-22 MED ORDER — DIPHENHYDRAMINE HCL 25 MG PO CAPS
25.0000 mg | ORAL_CAPSULE | ORAL | Status: DC | PRN
  Administered 2016-09-22 – 2016-09-25 (×4): 25 mg via ORAL
  Filled 2016-09-22 (×4): qty 1

## 2016-09-22 MED ORDER — ACETAMINOPHEN 325 MG PO TABS: 650.0000 mg | ORAL_TABLET | Freq: Four times a day (QID) | ORAL | Status: DC | PRN

## 2016-09-22 MED ORDER — VANCOMYCIN HCL IN DEXTROSE 1-5 GM/200ML-% IV SOLN: 1000.0000 mg | Freq: Once | INTRAVENOUS | Status: DC

## 2016-09-22 MED ORDER — POLYETHYLENE GLYCOL 3350 17 G PO PACK
17.0000 g | PACK | Freq: Every day | ORAL | Status: DC | PRN
  Administered 2016-09-24 – 2016-09-25 (×2): 17 g via ORAL
  Filled 2016-09-22 (×2): qty 1

## 2016-09-22 MED ORDER — CLINDAMYCIN PHOSPHATE 600 MG/50ML IV SOLN
600.0000 mg | Freq: Once | INTRAVENOUS | Status: AC
  Administered 2016-09-22: 600 mg via INTRAVENOUS
  Filled 2016-09-22: qty 50

## 2016-09-22 MED ORDER — DIPHENHYDRAMINE HCL 50 MG PO CAPS
50.0000 mg | ORAL_CAPSULE | Freq: Once | ORAL | Status: DC
  Filled 2016-09-22 (×3): qty 1

## 2016-09-22 MED ORDER — BISACODYL 5 MG PO TBEC: 5.0000 mg | DELAYED_RELEASE_TABLET | Freq: Every day | ORAL | Status: DC | PRN

## 2016-09-22 MED ORDER — ONDANSETRON HCL 4 MG PO TABS: 4.0000 mg | ORAL_TABLET | Freq: Four times a day (QID) | ORAL | Status: DC | PRN

## 2016-09-22 MED ORDER — HYDROCODONE-ACETAMINOPHEN 5-325 MG PO TABS
1.0000 | ORAL_TABLET | ORAL | Status: DC | PRN
  Administered 2016-09-23 – 2016-09-30 (×18): 2 via ORAL
  Filled 2016-09-22 (×18): qty 2

## 2016-09-22 MED ORDER — VANCOMYCIN HCL 10 G IV SOLR
1500.0000 mg | INTRAVENOUS | Status: DC
  Administered 2016-09-23: 1500 mg via INTRAVENOUS
  Filled 2016-09-22 (×2): qty 1500

## 2016-09-22 NOTE — Progress Notes (Addendum)
[place] for IV heparin Indication: recent PE/DVT  No Known Allergies  Patient Measurements: Height: 5\' 3"  (160 cm) Weight: 271 lb (122.9 kg) IBW/kg (Calculated) : 52.4 Heparin Dosing Weight: 82.7 kg  Vital Signs: Temp: 98.7 F (37.1 C) (03/14 1256) Temp Source: Oral (03/14 1256) BP: 152/102 (03/14 1837) Pulse Rate: 84 (03/14 1837)  Labs:  Recent Labs  09/22/16 1100  HGB 11.3*  HCT 33.4*  PLT 156  CREATININE 2.06*    Estimated Creatinine Clearance: 43.9 mL/min (by C-G formula based on SCr of 2.06 mg/dL (H)).   Medical History: Past Medical History:  Diagnosis Date  . Chronic back pain   . DVT (deep venous thrombosis) ([place])   . Hypertension   . Pulmonary embolism ([place])    a. diagnosed in 08/2016. Started on Eliquis    Medications:  Prescriptions Prior to Admission  Medication Sig Dispense Refill Last Dose  . acetaminophen (TYLENOL) 500 MG tablet Take 500 mg by mouth every 6 (six) hours as needed (pain).   Past Week at Unknown time  . apixaban (ELIQUIS) 5 MG TABS tablet Take 1 tablet (5 mg total) by mouth 2 (two) times daily. Please take 10 mg twice a day for 7 days and then 5 mg twice a day (Patient taking differently: Take 5 mg by mouth 2 (two) times daily. ) 60 tablet 0 09/21/2016 at 2000  . diphenhydramine-acetaminophen (TYLENOL PM) 25-500 MG TABS tablet Take 1 tablet by mouth at bedtime as needed (pain).   over a month  . furosemide (LASIX) 80 MG tablet Take 1 tablet (80 mg total) by mouth daily. 30 tablet 0 09/22/2016 at Unknown time  . lisinopril-hydrochlorothiazide (PRINZIDE,ZESTORETIC) 20-25 MG tablet Take 1 tablet by mouth daily.  0 09/22/2016 at Unknown time  . metoprolol tartrate (LOPRESSOR) 25 MG tablet Take 0.5 tablets (12.5 mg total) by mouth 2 (two) times daily. 60 tablet 0 09/21/2016 at 2000  . spironolactone (ALDACTONE) 25 MG tablet Take 25 mg by mouth daily.   09/22/2016 at Unknown time  . Vitamin D,  Ergocalciferol, (DRISDOL) 50000 units CAPS capsule Take 50,000 Units by mouth every Monday.   09/20/2016   Scheduled:  . metoprolol tartrate  12.5 mg Oral BID  . vancomycin  2,000 mg Intravenous Once   Followed by  . [START ON 09/23/2016] vancomycin  1,500 mg Intravenous Q24H   Infusions:  . sodium chloride 110 mL/hr at 09/22/16 2022  . heparin      Assessment: 71 yoF with PMH HTN, recent bilateral PE with DVT diagnosed 09/03/16 and currently on Eliquis, presents to [facility] with abdominal wall cellulitis with possible abscess. Transferred to [facility] with Surgery to evaluate for I&D. Converting Eliquis to heparin per pharmacy in anticipation of possible surgery.   Baseline heparin level, aPTT: pending  Prior anticoagulation: Eliquis 5 mg bid, last dose 3/13 at 2000  AKI noted on admission with SCr 2.06 (baseline ~1.3)  Significant events:  Today, 09/22/2016:  CBC: hgb sl low, Plt wnl  No bleeding or infusion issues per nursing  CrCl: 43 ml/min  Goal of Therapy: Heparin level 0.3-0.7 units/ml Monitor platelets by anticoagulation protocol: Yes  Plan:  Will omit heparin bolus; although > 12 hrs since last Eliquis dose, patient with new AKI and likely retaining DOAC. Would consider bolusing if aPTTs subtherapeutic, given recent, extensive VTE  Heparin 1600 units/hr IV infusion  Check aPTT 6 hrs after start and thereafter as needed; may revert to checking heparin levels once  correlating with aPTTs  Daily CBC, daily heparin level  Monitor for signs of bleeding or thrombosis  Regarding timing of procedures, please note that while heparin can be turned off and rapidly cleared even with poor renal function, patient may not have fully cleared Eliquis by 3/15. For patients with CrCl 30-50 ml/min, ACCP recommends holding Eliquis 3 days for low-risk procedures, and 4 days for high-risk procedures. Consider checking a heparin level 4-6 hrs after heparin turned off to ensure no detectable  Anti-Xa activity before proceeding to surgery.   Reuel Boom, PharmD Pager: [phone] 09/22/2016, 9:03 PM

## 2016-09-22 NOTE — Progress Notes (Signed)
Transfer from [facility] for abdominal wall cellulitis and possible abscess. Please consult surgery on arrival.   Caitlin Taylor. MD [phone]

## 2016-09-22 NOTE — Progress Notes (Signed)
Pt arrived from [facility] via [place]. Stood and ambulated to bed and then to BR with steady, independent gait. VSS. Reports mild 3/10 pain to lower abd/ pubic area. Pt oriented to [place]bell and environment. [facility] admitting paged to make aware of need for admission orders.

## 2016-09-22 NOTE — ED Provider Notes (Signed)
[facility] DEPT [facility] Provider Note   CSN: [id number] Arrival date & time: 09/22/16  0955     History   Chief Complaint Chief Complaint  Patient presents with  . Abdominal Pain    HPI Caitlin Taylor is a 46 y.o. female.  HPI Caitlin Taylor is a 46 y.o. female with recently diagnosed DVT, PE, now on Eliquis, hypertension, chronic back pain, presents to emergency department complaining of abdominal pain. Patient states symptoms began about a week ago and have gradually worsened. Denies any nausea or vomiting. No urinary symptoms. No vaginal discharge or bleeding. No fever or chills. Pain is worsened with palpation of the abdomen and with movement. She states that her lower abdomen feels warm and swollen. No treatment for her symptoms prior to coming in.  Past Medical History:  Diagnosis Date  . Chronic back pain   . DVT (deep venous thrombosis) ([place])   . Hypertension   . Pulmonary embolism ([place])    a. diagnosed in 08/2016. Started on Eliquis    Patient Active Problem List   Diagnosis Date Noted  . Acute cystitis without hematuria   . Acute deep vein thrombosis (DVT) of proximal vein of right lower extremity ([place])   . Edema 09/04/2016  . Bilateral pulmonary embolism ([place]) 09/03/2016  . Benign essential HTN   . Hypokalemia   . Itching   . Swelling   . CKD (chronic kidney disease)   . Acute lower UTI   . Leukocytosis   . Lumbar myelopathy ([place]) 01/12/2016  . Low back pain   . Lumbar stenosis   . S/P lumbar fusion   . Post-operative pain   . Acute blood loss anemia   . Constipation due to pain medication   . Morbid obesity due to excess calories ([place])   . Back pain 01/05/2016  . Renal insufficiency 01/05/2016  . Hyperglycemia 01/05/2016  . Lumbar radiculopathy 01/05/2016  . UTI (urinary tract infection) 01/05/2016  . CKD (chronic kidney disease) stage 2, GFR 60-89 ml/min 01/05/2016  . Essential hypertension 01/05/2016    Past Surgical History:    Procedure Laterality Date  . CESAREAN SECTION    . MAXIMUM ACCESS (MAS)POSTERIOR LUMBAR INTERBODY FUSION (PLIF) 1 LEVEL N/A 01/06/2016   Procedure: FOR MAXIMUM ACCESS (MAS) POSTERIOR LUMBAR INTERBODY FUSION (PLIF) LUMBAR FOUR-FIVE;  Surgeon: Kevan Ny Ditty, MD;  Location: [facility];  Service: Neurosurgery;  Laterality: N/A;    OB History    No data available       Home Medications    Prior to Admission medications   Medication Sig Start Date End Date Taking? Authorizing Provider  apixaban (ELIQUIS) 5 MG TABS tablet Take 1 tablet (5 mg total) by mouth 2 (two) times daily. Please take 10 mg twice a day for 7 days and then 5 mg twice a day 09/12/16   Dron Tanna Furry, MD  diphenhydramine-acetaminophen (TYLENOL PM) 25-500 MG TABS tablet Take 1 tablet by mouth at bedtime as needed (pain).    Historical Provider, MD  furosemide (LASIX) 80 MG tablet Take 1 tablet (80 mg total) by mouth daily. 09/06/16   Dron Tanna Furry, MD  lisinopril-hydrochlorothiazide (PRINZIDE,ZESTORETIC) 20-25 MG tablet Take 1 tablet by mouth daily. 08/10/16   Historical Provider, MD  metoprolol tartrate (LOPRESSOR) 25 MG tablet Take 0.5 tablets (12.5 mg total) by mouth 2 (two) times daily. 09/06/16   Dron Tanna Furry, MD  spironolactone (ALDACTONE) 25 MG tablet Take 25 mg by mouth daily. 08/26/16  Historical Provider, MD  Vitamin D, Ergocalciferol, (DRISDOL) 50000 units CAPS capsule Take 50,000 Units by mouth every Monday. 08/09/16   Historical Provider, MD    Family History Family History  Problem Relation Age of Onset  . Congestive Heart Failure Father     Social History Social History  Substance Use Topics  . Smoking status: Never Smoker  . Smokeless tobacco: Never Used  . Alcohol use No     Allergies   Patient has no known allergies.   Review of Systems Review of Systems  Constitutional: Negative for chills and fever.  Respiratory: Negative for cough, chest tightness and shortness of  breath.   Cardiovascular: Negative for chest pain, palpitations and leg swelling.  Gastrointestinal: Positive for abdominal pain. Negative for diarrhea, nausea and vomiting.  Genitourinary: Negative for dysuria, flank pain, pelvic pain, vaginal bleeding, vaginal discharge and vaginal pain.  Musculoskeletal: Negative for arthralgias, myalgias, neck pain and neck stiffness.  Skin: Negative for rash.  Neurological: Negative for dizziness, weakness and headaches.  All other systems reviewed and are negative.    Physical Exam Updated Vital Signs BP 103/61 (BP Location: Left Arm)   Pulse 80   Temp 98.3 F (36.8 C) (Oral)   Resp 20   Ht 5\' 3"  (1.6 m)   Wt 122.9 kg   LMP 09/05/2016 Comment: Negative u-preg  SpO2 95%   BMI 48.01 kg/m   Physical Exam  Constitutional: She appears well-developed and well-nourished. No distress.  HENT:  Head: Normocephalic.  Eyes: Conjunctivae are normal.  Neck: Neck supple.  Cardiovascular: Normal rate, regular rhythm and normal heart sounds.   Pulmonary/Chest: Effort normal and breath sounds normal. No respiratory distress. She has no wheezes. She has no rales.  Abdominal: Soft. Bowel sounds are normal. She exhibits no distension. There is tenderness. There is no rebound.  Diffuse cellulitis of the lower abdomen starting at umbilicus and extending to the mons pubis mainly on the right lower abdomen. There is induration to the right inferior pannus with some fluctuance. Tender to the touch.  Musculoskeletal: She exhibits no edema.  Neurological: She is alert.  Skin: Skin is warm and dry.  Psychiatric: She has a normal mood and affect. Her behavior is normal.  Nursing note and vitals reviewed.    ED Treatments / Results  Labs (all labs ordered are listed, but only abnormal results are displayed) Labs Reviewed  CBC WITH DIFFERENTIAL/PLATELET - Abnormal; Notable for the following:       Result Value   WBC 12.6 (*)    RBC 3.76 (*)    Hemoglobin  11.3 (*)    HCT 33.4 (*)    Neutro Abs 10.9 (*)    Lymphs Abs 0.6 (*)    Monocytes Absolute 1.1 (*)    All other components within normal limits  BASIC METABOLIC PANEL - Abnormal; Notable for the following:    Glucose, Bld 113 (*)    BUN 25 (*)    Creatinine, Ser 2.06 (*)    GFR calc non Af Amer 28 (*)    GFR calc Af Amer 32 (*)    All other components within normal limits  PREGNANCY, URINE    EKG  EKG Interpretation None       Radiology Ct Pelvis Wo Contrast  Result Date: 09/22/2016 CLINICAL DATA:  Red, warm, extremely painful region lower anterior abdominal wall EXAM: CT PELVIS WITHOUT CONTRAST TECHNIQUE: Multidetector CT imaging of the pelvis was performed following the standard protocol without intravenous contrast.  COMPARISON:  None. FINDINGS: Urinary Tract:  Unremarkable. Bowel:  Unremarkable. Vascular/Lymphatic: Unremarkable. Reproductive:  Unremarkable. Other:  No free fluid. Musculoskeletal: 4.1 x 5.0 cm irregular soft tissue attenuating lesion is identified in the lower right pannus with surrounding edema/ inflammation and overlying skin thickening. Lesion not fully evaluated given the lack of intravenous contrast material. Bone windows reveal no worrisome lytic or sclerotic osseous lesions. Patient is status post lower lumbar fusion. IMPRESSION: 1. 4.1 x 5.0 cm irregular soft tissue attenuating lesion in the subcutaneous fat of the lower right pannus. Given clinical history and the overlying skin thickening and adjacent edema/inflammation, this is probably phlegmon/evolving abscess. Subcutaneous hematoma or subcutaneous metastatic lesion cannot be excluded by imaging alone. Electronically Signed   By: Misty Stanley M.D.   On: 09/22/2016 12:23   US Thyroid  Result Date: 09/21/2016 CLINICAL DATA:  Incidental on CT.  Left thyroid nodule noted on CT EXAM: THYROID ULTRASOUND TECHNIQUE: Ultrasound examination of the thyroid gland and adjacent soft tissues was performed. COMPARISON:   None. FINDINGS: Parenchymal Echotexture: Normal Isthmus: 0.4 cm Right lobe: 5.3 x 1.7 x 2.1 cm Left lobe: 5.2 x 1.4 x 2.0 cm _________________________________________________________ Estimated total number of nodules >/= 1 cm: 0 Number of spongiform nodules >/=  2 cm not described below (TR1): 0 Number of mixed cystic and solid nodules >/= 1.5 cm not described below (TR2): 0 _________________________________________________________ Small nodules and cysts measuring 0.5 cm or less are scattered throughout the left lobe and have a benign appearance. IMPRESSION: Tiny nodules and cysts in left lobe were present. None meet criteria for fine needle aspiration biopsy or annual follow-up. The above is in keeping with the ACR TI-RADS recommendations - J Am Coll Radiol 2017;14:587-595. Electronically Signed   By: Marybelle Killings M.D.   On: 09/21/2016 15:28    Procedures Procedures (including critical care time)  Medications Ordered in ED Medications  clindamycin (CLEOCIN) IVPB 600 mg (not administered)  morphine 4 MG/ML injection 4 mg (not administered)  ondansetron (ZOFRAN) injection 4 mg (not administered)  sodium chloride 0.9 % bolus 1,000 mL (1,000 mLs Intravenous New Bag/Given 09/22/16 1215)     Initial Impression / Assessment and Plan / ED Course  I have reviewed the triage vital signs and the nursing notes.  Pertinent labs & imaging results that were available during my care of the patient were reviewed by me and considered in my medical decision making (see chart for details).    Patient with extensive cellulitis of the lower abdomen with some induration and fluctuance to the pannus on the right side. We will get labs, CT pelvis with contrast to evaluate for possible abscess. She is afebrile, vital signs are normal, she does not appear to be septic.   Patient's creatinine is bumped from baseline of 1.3 to 2.06 today I will give her IV fluids, we will do CT without contrast. Her GFR is 32.    12:44 PM CT as described above. I will start her on clindamycin. Given the depth of the abscess and extensive cellulitis, and the fact the patient is on Eliquis, will hold off on I&D. Pt may need a surgical consult. Will admit for treatment.   1:28 PM Spoke with Dr. Karleen Hampshire, triad, will admit to [facility] ED med surge.   Final Clinical Impressions(s) / ED Diagnoses   Final diagnoses:  Abdominal wall cellulitis  Renal insufficiency    New Prescriptions New Prescriptions   No medications on file     Jeannett Senior,  PA-C 09/22/16 [place], MD 09/30/16 [phone]

## 2016-09-22 NOTE — ED Triage Notes (Signed)
Pt having lower abdominal/ suprapubic pain for one week.  Pt has had period for three weeks with clotting.  Pt denies fever.  Pt did have some N/V/D earlier in the week.  Pt recently treated for PE and DVT.

## 2016-09-22 NOTE — Progress Notes (Signed)
Pharmacy Antibiotic Note  Caitlin Taylor is a 46 y.o. female transferred from [facility] on 09/22/2016 for abdominal wall cellulitis and possible abscess.  Pharmacy has been consulted for vancomycin dosing. Surgery consulted  Plan: Vancomycin 2000 mg IV now, then 1500 mg IV q24 hr; goal trough 10-15 mcg/mL Measure vancomycin trough levels at steady state as indicated   Height: 5\' 3"  (160 cm) Weight: 271 lb (122.9 kg) IBW/kg (Calculated) : 52.4  Temp (24hrs), Avg:98.5 F (36.9 C), Min:98.3 F (36.8 C), Max:98.7 F (37.1 C)   Recent Labs Lab 09/22/16 1100  WBC 12.6*  CREATININE 2.06*    Estimated Creatinine Clearance: 43.9 mL/min (by C-G formula based on SCr of 2.06 mg/dL (H)).    No Known Allergies   Thank you for allowing pharmacy to be a part of this patient's care.  Reuel Boom, PharmD, BCPS Pager: [phone] 09/22/2016, 7:58 PM

## 2016-09-22 NOTE — H&P (Signed)
History and Physical    Caitlin Taylor TKP:[id number] DOB: 1970/11/30 DOA: 09/22/2016  PCP: [name], MD   Patient coming from: Home, by way of [facility]   Chief Complaint: Lower abdominal pain, redness, swelling  HPI: Caitlin Taylor is a 46 y.o. female with medical history significant for hypertension and recent diagnosis of DVT and PE and now managed with Eliquis, presenting in transfer from [facility] where she was seen for 1 week of worsening lower abdominal pain with redness and swelling. Patient reports that she had been in her usual state of health until approximately one week ago and she noted the insidious development of tenderness along the lower abdomen, just right of the midline. Pain has progressively increased since that time and she has noted a focal area of swelling at her waistline in the right lower quadrant. She denies any fevers per se, but notes a general malaise over the past couple days and occasional chills. She denies any history of skin or soft tissue infections and denies any history of diabetes. There has been no bruising or bleeding since starting anticoagulant. She denies any recent chest pain or palpitations and denies dyspnea or cough. There is been no dysuria or flank pain.  [facility] ED Course: Upon arrival to the [facility] ED, patient is found to be afebrile, saturating well on room air, and with vital signs stable. Chemistry panels notable for a creatinine of 2.06, up from 1.33 two weeks ago. CBC features a stable normocytic anemia with hemoglobin of 11.3 and a mild leukocytosis to 12,600. CT of the abdomen and pelvis was obtained without contrast and features a 4.1 x 5.0 cm irregular soft tissue attenuating lesion in the subcutaneous fat of the lower right hand is concerning for a phlegmon/evolving abscess. Patient was given a liter of normal saline, 4 mg IV morphine, Zofran, and started on empiric clindamycin. She was transferred to  [facility] for admission and medical-surgical unit for ongoing evaluation and management of abdominal wall cellulitis with concern for underlying abscess.  Review of Systems:  All other systems reviewed and apart from HPI, are negative.  Past Medical History:  Diagnosis Date  . Chronic back pain   . DVT (deep venous thrombosis) ([place])   . Hypertension   . Pulmonary embolism ([place])    a. diagnosed in 08/2016. Started on Eliquis    Past Surgical History:  Procedure Laterality Date  . CESAREAN SECTION    . MAXIMUM ACCESS (MAS)POSTERIOR LUMBAR INTERBODY FUSION (PLIF) 1 LEVEL N/A 01/06/2016   Procedure: FOR MAXIMUM ACCESS (MAS) POSTERIOR LUMBAR INTERBODY FUSION (PLIF) LUMBAR FOUR-FIVE;  Surgeon: Kevan Ny Ditty, MD;  Location: [place] NEURO ORS;  Service: Neurosurgery;  Laterality: N/A;     reports that she has never smoked. She has never used smokeless tobacco. She reports that she does not drink alcohol or use drugs.  No Known Allergies  Family History  Problem Relation Age of Onset  . Congestive Heart Failure Father      Prior to Admission medications   Medication Sig Start Date End Date Taking? Authorizing Provider  acetaminophen (TYLENOL) 500 MG tablet Take 500 mg by mouth every 6 (six) hours as needed (pain).   Yes Historical Provider, MD  apixaban (ELIQUIS) 5 MG TABS tablet Take 1 tablet (5 mg total) by mouth 2 (two) times daily. Please take 10 mg twice a day for 7 days and then 5 mg twice a day Patient taking differently: Take 5  mg by mouth 2 (two) times daily.  09/12/16  Yes Dron Tanna Furry, MD  diphenhydramine-acetaminophen (TYLENOL PM) 25-500 MG TABS tablet Take 1 tablet by mouth at bedtime as needed (pain).   Yes Historical Provider, MD  furosemide (LASIX) 80 MG tablet Take 1 tablet (80 mg total) by mouth daily. 09/06/16  Yes Dron Tanna Furry, MD  lisinopril-hydrochlorothiazide (PRINZIDE,ZESTORETIC) 20-25 MG tablet Take 1 tablet by mouth daily. 08/10/16  Yes  Historical Provider, MD  metoprolol tartrate (LOPRESSOR) 25 MG tablet Take 0.5 tablets (12.5 mg total) by mouth 2 (two) times daily. 09/06/16  Yes Dron Tanna Furry, MD  spironolactone (ALDACTONE) 25 MG tablet Take 25 mg by mouth daily. 08/26/16  Yes Historical Provider, MD  Vitamin D, Ergocalciferol, (DRISDOL) 50000 units CAPS capsule Take 50,000 Units by mouth every Monday. 08/09/16  Yes Historical Provider, MD    Physical Exam: Vitals:   09/22/16 1256 09/22/16 1543 09/22/16 1835 09/22/16 1837  BP: 101/62 108/70 (!) 152/102 (!) 152/102  Pulse: 76 81 84 84  Resp: 18 18  18   Temp: 98.7 F (37.1 C)     TempSrc: Oral     SpO2: 98% 98%  100%  Weight:      Height:          Constitutional: NAD, calm, comfortable, obese Eyes: PERTLA, lids and conjunctivae normal ENMT: Mucous membranes are moist. Posterior pharynx clear of any exudate or lesions.   Neck: normal, supple, no masses, no thyromegaly Respiratory: clear to auscultation bilaterally, no wheezing, no crackles. Normal respiratory effort. No accessory muscle use.  Cardiovascular: S1 & S2 heard, regular rate and rhythm. No extremity edema. No significant JVD. Abdomen: No distension, soft. Erythema and induration at lower abdomen, extending to right groin with heat and focal swelling in RLQ at underside of pannus. Bowel sounds normal.  Musculoskeletal: no clubbing / cyanosis. No joint deformity upper and lower extremities. Normal muscle tone.  Skin: no significant rashes, lesions, ulcers. Warm, dry, well-perfused. Neurologic: CN 2-12 grossly intact. Sensation intact, DTR normal. Strength 5/5 in all 4 limbs.  Psychiatric: Normal judgment and insight. Alert and oriented x 3. Normal mood and affect.     Labs on Admission: I have personally reviewed following labs and imaging studies  CBC:  Recent Labs Lab 09/22/16 1100  WBC 12.6*  NEUTROABS 10.9*  HGB 11.3*  HCT 33.4*  MCV 88.8  PLT 678   Basic Metabolic Panel:  Recent  Labs Lab 09/22/16 1100  NA 137  K 3.6  CL 101  CO2 27  GLUCOSE 113*  BUN 25*  CREATININE 2.06*  CALCIUM 9.1   GFR: Estimated Creatinine Clearance: 43.9 mL/min (by C-G formula based on SCr of 2.06 mg/dL (H)). Liver Function Tests: No results for input(s): AST, ALT, ALKPHOS, BILITOT, PROT, ALBUMIN in the last 168 hours. No results for input(s): LIPASE, AMYLASE in the last 168 hours. No results for input(s): AMMONIA in the last 168 hours. Coagulation Profile: No results for input(s): INR, PROTIME in the last 168 hours. Cardiac Enzymes: No results for input(s): CKTOTAL, CKMB, CKMBINDEX, TROPONINI in the last 168 hours. BNP (last 3 results) No results for input(s): PROBNP in the last 8760 hours. HbA1C: No results for input(s): HGBA1C in the last 72 hours. CBG: No results for input(s): GLUCAP in the last 168 hours. Lipid Profile: No results for input(s): CHOL, HDL, LDLCALC, TRIG, CHOLHDL, LDLDIRECT in the last 72 hours. Thyroid Function Tests: No results for input(s): TSH, T4TOTAL, FREET4, T3FREE, THYROIDAB in the last  72 hours. Anemia Panel: No results for input(s): VITAMINB12, FOLATE, FERRITIN, TIBC, IRON, RETICCTPCT in the last 72 hours. Urine analysis:    Component Value Date/Time   COLORURINE YELLOW 09/03/2016 2052   APPEARANCEUR TURBID (A) 09/03/2016 2052   LABSPEC 1.016 09/03/2016 2052   PHURINE 5.5 09/03/2016 2052   GLUCOSEU NEGATIVE 09/03/2016 2052   HGBUR LARGE (A) 09/03/2016 2052   BILIRUBINUR NEGATIVE 09/03/2016 2052   [place] NEGATIVE 09/03/2016 2052   PROTEINUR 30 (A) 09/03/2016 2052   NITRITE POSITIVE (A) 09/03/2016 2052   LEUKOCYTESUR LARGE (A) 09/03/2016 2052   Sepsis Labs: @LABRCNTIP (procalcitonin:4,lacticidven:4) )No results found for this or any previous visit (from the past 240 hour(s)).   Radiological Exams on Admission: Ct Pelvis Wo Contrast  Result Date: 09/22/2016 CLINICAL DATA:  Red, warm, extremely painful region lower anterior abdominal  wall EXAM: CT PELVIS WITHOUT CONTRAST TECHNIQUE: Multidetector CT imaging of the pelvis was performed following the standard protocol without intravenous contrast. COMPARISON:  None. FINDINGS: Urinary Tract:  Unremarkable. Bowel:  Unremarkable. Vascular/Lymphatic: Unremarkable. Reproductive:  Unremarkable. Other:  No free fluid. Musculoskeletal: 4.1 x 5.0 cm irregular soft tissue attenuating lesion is identified in the lower right pannus with surrounding edema/ inflammation and overlying skin thickening. Lesion not fully evaluated given the lack of intravenous contrast material. Bone windows reveal no worrisome lytic or sclerotic osseous lesions. Patient is status post lower lumbar fusion. IMPRESSION: 1. 4.1 x 5.0 cm irregular soft tissue attenuating lesion in the subcutaneous fat of the lower right pannus. Given clinical history and the overlying skin thickening and adjacent edema/inflammation, this is probably phlegmon/evolving abscess. Subcutaneous hematoma or subcutaneous metastatic lesion cannot be excluded by imaging alone. Electronically Signed   By: Misty Stanley M.D.   On: 09/22/2016 12:23   US Thyroid  Result Date: 09/21/2016 CLINICAL DATA:  Incidental on CT.  Left thyroid nodule noted on CT EXAM: THYROID ULTRASOUND TECHNIQUE: Ultrasound examination of the thyroid gland and adjacent soft tissues was performed. COMPARISON:  None. FINDINGS: Parenchymal Echotexture: Normal Isthmus: 0.4 cm Right lobe: 5.3 x 1.7 x 2.1 cm Left lobe: 5.2 x 1.4 x 2.0 cm _________________________________________________________ Estimated total number of nodules >/= 1 cm: 0 Number of spongiform nodules >/=  2 cm not described below (TR1): 0 Number of mixed cystic and solid nodules >/= 1.5 cm not described below (TR2): 0 _________________________________________________________ Small nodules and cysts measuring 0.5 cm or less are scattered throughout the left lobe and have a benign appearance. IMPRESSION: Tiny nodules and cysts  in left lobe were present. None meet criteria for fine needle aspiration biopsy or annual follow-up. The above is in keeping with the ACR TI-RADS recommendations - J Am Coll Radiol 2017;14:587-595. Electronically Signed   By: Marybelle Killings M.D.   On: 09/21/2016 15:28    EKG: Not performed, will obtain as appropriate.   Assessment/Plan  1. Abdominal wall cellulitis with abscess  - Pt presents with 1 wk of worsening tenderness in lower abd and a couple days of chills of malaise   - There is a mild leukocytosis and AKI on presentation, but no fever or tachycardia; CT findings suggest a developing abscess  - She was given a dose of clindamycin at [facility] and is continued on vancomycin empirically for purulent cellulitis with leukocytosis and AKI  - Gen surgery is consulting and much appreciated; plan to hold Eliquis, start heparin infusion in anticipation of possible I&D  - Continue abx, supportive care; follow clinical response   2. Acute kidney injury superimposed  on CKD stage III  - SCr is 2.06 on admission, up from 1.33 two weeks prior  - Likely secondary to the acute infection, exacerbated by lisinopril and diuretics  - Urinary tract appears unremarkable on CT abd/pelvis  - She was given 1 liter NS at [facility] and is continued on IVF hydration  - Hold lisinopril, Lasix, Aldactone, and HCTZ - Renally-dose medications - Check urine studies, repeat chem panel in am   3. Hx of DVT and PE  - Pt was diagnosed with DVT and b/l PE in February 2018  - She has been tolerating Eliquis well and there is no suggestion of acute VTE  - In anticipation of likely I&D, Eliquis is held on admission and she has been started on heparin infusion with pharmacy assistance    4. Normocytic anemia  - Hgb is 11.3 on admission - This is stable relative to priors and with no evidence for bleeding    5. Chronic diastolic CHF   - Pt appears dry on admission in setting of acute infection  - She was given a liter of NS at  [facility] and is continued on NS infusion  - TTE (09/04/16) with EF 03-12%, grade 1 diastolic dysfunction, no WMA, and no significant valvular disease  - She is managed at home with Lopressor, lisinopril, Lasix, and Aldactone  - Lopressor is continued; Lasix, lisinopril, and Aldactone held on admission in light of AKI  - Follow daily wts and I/O's     DVT prophylaxis: IV heparin infusion  Code Status: Full  Family Communication: Discussed with patient Disposition Plan: Admit to med-surg Consults called: Gen surgery  Admission status: Inpatient    [name], MD [facility] Pager [phone]  If 7PM-7AM, please contact night-coverage [email or web address] Password [facility]  09/22/2016, 7:52 PM

## 2016-09-22 NOTE — ED Notes (Signed)
[facility] is aware of bed at [facility], for transferring.

## 2016-09-22 NOTE — Consult Note (Signed)
Reason for Consult:abdominal wall cellulitis  Referring Physician: Dr Myna Hidalgo  Caitlin Taylor is an 46 y.o. female.  HPI: Caitlin Taylor is a 46 y.o. female with recently diagnosed DVT, PE, now on Eliquis, who presents to emergency department complaining of abdominal wall pain. Patient states symptoms began about a week ago and have gradually worsened. Denies any nausea or vomiting.  No fever or chills. Pain is worsened with palpation of the abdomen and with movement. She states that her lower abdomen feels warm and swollen.  Past Medical History:  Diagnosis Date  . Chronic back pain   . DVT (deep venous thrombosis) ([place])   . Hypertension   . Pulmonary embolism ([place])    a. diagnosed in 08/2016. Started on Eliquis    Past Surgical History:  Procedure Laterality Date  . CESAREAN SECTION    . MAXIMUM ACCESS (MAS)POSTERIOR LUMBAR INTERBODY FUSION (PLIF) 1 LEVEL N/A 01/06/2016   Procedure: FOR MAXIMUM ACCESS (MAS) POSTERIOR LUMBAR INTERBODY FUSION (PLIF) LUMBAR FOUR-FIVE;  Surgeon: Kevan Ny Ditty, MD;  Location: [facility];  Service: Neurosurgery;  Laterality: N/A;    Family History  Problem Relation Age of Onset  . Congestive Heart Failure Father     Social History:  reports that she has never smoked. She has never used smokeless tobacco. She reports that she does not drink alcohol or use drugs.  Allergies: No Known Allergies  Medications: I have reviewed the patient's current medications.  Results for orders placed or performed during the hospital encounter of 09/22/16 (from the past 48 hour(s))  CBC with Differential     Status: Abnormal   Collection Time: 09/22/16 11:00 AM  Result Value Ref Range   WBC 12.6 (H) 4.0 - 10.5 K/uL   RBC 3.76 (L) 3.87 - 5.11 MIL/uL   Hemoglobin 11.3 (L) 12.0 - 15.0 g/dL   HCT 33.4 (L) 36.0 - 46.0 %   MCV 88.8 78.0 - 100.0 fL   MCH 30.1 26.0 - 34.0 pg   MCHC 33.8 30.0 - 36.0 g/dL   RDW 13.6 11.5 - 15.5 %   Platelets 156 150 - 400 K/uL    Neutrophils Relative % 85 %   Lymphocytes Relative 5 %   Monocytes Relative 9 %   Eosinophils Relative 0 %   Basophils Relative 0 %   Band Neutrophils 1 %   Neutro Abs 10.9 (H) 1.7 - 7.7 K/uL   Lymphs Abs 0.6 (L) 0.7 - 4.0 K/uL   Monocytes Absolute 1.1 (H) 0.1 - 1.0 K/uL   Eosinophils Absolute 0.0 0.0 - 0.7 K/uL   Basophils Absolute 0.0 0.0 - 0.1 K/uL   RBC Morphology STOMATOCYTES    Smear Review LARGE PLATELETS PRESENT   Basic metabolic panel     Status: Abnormal   Collection Time: 09/22/16 11:00 AM  Result Value Ref Range   Sodium 137 135 - 145 mmol/L   Potassium 3.6 3.5 - 5.1 mmol/L   Chloride 101 101 - 111 mmol/L   CO2 27 22 - 32 mmol/L   Glucose, Bld 113 (H) 65 - 99 mg/dL   BUN 25 (H) 6 - 20 mg/dL   Creatinine, Ser 2.06 (H) 0.44 - 1.00 mg/dL   Calcium 9.1 8.9 - 10.3 mg/dL   GFR calc non Af Amer 28 (L) >60 mL/min   GFR calc Af Amer 32 (L) >60 mL/min    Comment: (NOTE) The eGFR has been calculated using the CKD EPI equation. This calculation has not been validated  in all clinical situations. eGFR's persistently <60 mL/min signify possible Chronic Kidney Disease.    Anion gap 9 5 - 15  Pregnancy, urine     Status: None   Collection Time: 09/22/16 11:21 AM  Result Value Ref Range   Preg Test, Ur NEGATIVE NEGATIVE    Comment:        THE SENSITIVITY OF THIS METHODOLOGY IS >20 mIU/mL.     Ct Pelvis Wo Contrast  Result Date: 09/22/2016 CLINICAL DATA:  Red, warm, extremely painful region lower anterior abdominal wall EXAM: CT PELVIS WITHOUT CONTRAST TECHNIQUE: Multidetector CT imaging of the pelvis was performed following the standard protocol without intravenous contrast. COMPARISON:  None. FINDINGS: Urinary Tract:  Unremarkable. Bowel:  Unremarkable. Vascular/Lymphatic: Unremarkable. Reproductive:  Unremarkable. Other:  No free fluid. Musculoskeletal: 4.1 x 5.0 cm irregular soft tissue attenuating lesion is identified in the lower right pannus with surrounding edema/  inflammation and overlying skin thickening. Lesion not fully evaluated given the lack of intravenous contrast material. Bone windows reveal no worrisome lytic or sclerotic osseous lesions. Patient is status post lower lumbar fusion. IMPRESSION: 1. 4.1 x 5.0 cm irregular soft tissue attenuating lesion in the subcutaneous fat of the lower right pannus. Given clinical history and the overlying skin thickening and adjacent edema/inflammation, this is probably phlegmon/evolving abscess. Subcutaneous hematoma or subcutaneous metastatic lesion cannot be excluded by imaging alone. Electronically Signed   By: Misty Stanley M.D.   On: 09/22/2016 12:23   US Thyroid  Result Date: 09/21/2016 CLINICAL DATA:  Incidental on CT.  Left thyroid nodule noted on CT EXAM: THYROID ULTRASOUND TECHNIQUE: Ultrasound examination of the thyroid gland and adjacent soft tissues was performed. COMPARISON:  None. FINDINGS: Parenchymal Echotexture: Normal Isthmus: 0.4 cm Right lobe: 5.3 x 1.7 x 2.1 cm Left lobe: 5.2 x 1.4 x 2.0 cm _________________________________________________________ Estimated total number of nodules >/= 1 cm: 0 Number of spongiform nodules >/=  2 cm not described below (TR1): 0 Number of mixed cystic and solid nodules >/= 1.5 cm not described below (TR2): 0 _________________________________________________________ Small nodules and cysts measuring 0.5 cm or less are scattered throughout the left lobe and have a benign appearance. IMPRESSION: Tiny nodules and cysts in left lobe were present. None meet criteria for fine needle aspiration biopsy or annual follow-up. The above is in keeping with the ACR TI-RADS recommendations - J Am Coll Radiol 2017;14:587-595. Electronically Signed   By: Marybelle Killings M.D.   On: 09/21/2016 15:28    Review of Systems  Constitutional: Negative for chills and fever.  HENT: Negative for hearing loss and tinnitus.   Eyes: Negative for blurred vision and double vision.  Respiratory: Negative  for cough and sputum production.   Cardiovascular: Negative for chest pain and palpitations.  Gastrointestinal: Negative for heartburn, nausea and vomiting.  Genitourinary: Negative for dysuria, frequency and urgency.   Blood pressure (!) 115/46, pulse 92, temperature 98.4 F (36.9 C), temperature source Oral, resp. rate 18, height '5\' 3"'$  (1.6 m), weight 122.9 kg (271 lb), last menstrual period 09/05/2016, SpO2 96 %. Physical Exam  Constitutional: She is oriented to person, place, and time. She appears well-developed and well-nourished. No distress.  HENT:  Head: Normocephalic and atraumatic.  Eyes: Conjunctivae and EOM are normal. Pupils are equal, round, and reactive to light.  Neck: Normal range of motion. Neck supple.  Cardiovascular: Normal rate and regular rhythm.   Respiratory: Effort normal. No respiratory distress.  GI: Soft. She exhibits no distension. There is tenderness.  Cellulitis throughout lower right side of panus   Musculoskeletal: Normal range of motion.  Neurological: She is alert and oriented to person, place, and time.    Assessment/Plan: 46 y.o. F with a recent diagnosis of PE who presents to ED with worsening cellulitis and phlegmon seen on CT.  Cont IV abx.  Will follow for need for I&D in the future.  Hep gtt for anticoagulation in case surgery is needed.    Mykala Mccready C. 1/89/8421, 0:31 PM

## 2016-09-23 ENCOUNTER — Encounter (HOSPITAL_COMMUNITY): Payer: Self-pay

## 2016-09-23 LAB — URINALYSIS, ROUTINE W REFLEX MICROSCOPIC
BILIRUBIN URINE: NEGATIVE
Glucose, UA: NEGATIVE mg/dL
Ketones, ur: NEGATIVE mg/dL
Nitrite: NEGATIVE
PH: 5 (ref 5.0–8.0)
Protein, ur: NEGATIVE mg/dL
Specific Gravity, Urine: 1.011 (ref 1.005–1.030)

## 2016-09-23 LAB — CBC
HCT: 28.6 % — ABNORMAL LOW (ref 36.0–46.0)
HEMOGLOBIN: 9.9 g/dL — AB (ref 12.0–15.0)
MCH: 30.5 pg (ref 26.0–34.0)
MCHC: 34.6 g/dL (ref 30.0–36.0)
MCV: 88 fL (ref 78.0–100.0)
PLATELETS: 155 10*3/uL (ref 150–400)
RBC: 3.25 MIL/uL — AB (ref 3.87–5.11)
RDW: 14 % (ref 11.5–15.5)
WBC: 12.8 10*3/uL — AB (ref 4.0–10.5)

## 2016-09-23 LAB — BASIC METABOLIC PANEL
ANION GAP: 9 (ref 5–15)
BUN: 27 mg/dL — AB (ref 6–20)
CO2: 24 mmol/L (ref 22–32)
Calcium: 8.5 mg/dL — ABNORMAL LOW (ref 8.9–10.3)
Chloride: 104 mmol/L (ref 101–111)
Creatinine, Ser: 2.12 mg/dL — ABNORMAL HIGH (ref 0.44–1.00)
GFR calc Af Amer: 31 mL/min — ABNORMAL LOW (ref >60)
GFR calc non Af Amer: 27 mL/min — ABNORMAL LOW (ref >60)
GLUCOSE: 114 mg/dL — AB (ref 65–99)
POTASSIUM: 3.5 mmol/L (ref 3.5–5.1)
Sodium: 137 mmol/L (ref 135–145)

## 2016-09-23 LAB — HEPARIN LEVEL (UNFRACTIONATED): Heparin Unfractionated: >2.2 IU/mL — ABNORMAL HIGH (ref 0.30–0.70)

## 2016-09-23 LAB — APTT
aPTT: 108 seconds — ABNORMAL HIGH (ref 24–36)
aPTT: 144 seconds — ABNORMAL HIGH (ref 24–36)
aPTT: 77 seconds — ABNORMAL HIGH (ref 24–36)

## 2016-09-23 LAB — GLUCOSE, CAPILLARY: GLUCOSE-CAPILLARY: 125 mg/dL — AB (ref 65–99)

## 2016-09-23 LAB — HIV ANTIBODY (ROUTINE TESTING W REFLEX): HIV Screen 4th Generation wRfx: NONREACTIVE

## 2016-09-23 MED ORDER — HEPARIN (PORCINE) IN NACL 100-0.45 UNIT/ML-% IJ SOLN
1300.0000 [IU]/h | INTRAMUSCULAR | Status: DC
  Administered 2016-09-23 – 2016-09-27 (×5): 1300 [IU]/h via INTRAVENOUS
  Filled 2016-09-23 (×6): qty 250

## 2016-09-23 NOTE — Progress Notes (Signed)
ANTICOAGULATION CONSULT NOTE - Follow Up Consult  Pharmacy Consult for Heparin Indication: recent PE/DVT, on apixaban prior to admission.  No Known Allergies  Patient Measurements: Height: 5\' 3"  (160 cm) Weight: 271 lb (122.9 kg) IBW/kg (Calculated) : 52.4 Heparin Dosing Weight:   Vital Signs: Temp: 98.4 F (36.9 C) (03/14 2129) Temp Source: Oral (03/14 2129) BP: 115/46 (03/14 2129) Pulse Rate: 92 (03/14 2129)  Labs:  Recent Labs  09/22/16 1100 09/22/16 2126 09/23/16 0259  HGB 11.3*  --  9.9*  HCT 33.4*  --  28.6*  PLT 156  --  155  APTT  --  33 108*  HEPARINUNFRC  --  >2.20* >2.20*  CREATININE 2.06*  --   --     Estimated Creatinine Clearance: 43.9 mL/min (A) (by C-G formula based on SCr of 2.06 mg/dL (H)).   Medications:  Infusions:  . sodium chloride 110 mL/hr at 09/22/16 2022  . heparin 1,600 Units/hr (09/22/16 2143)    Assessment: Patient with PTT just above goal.  Heparin level very above goal.  PTT ordered with Heparin level until both correlate due to possible drug-lab interaction between oral anticoagulant (rivaroxaban, edoxaban, or apixaban) and anti-Xa level (aka heparin level) . No heparin issues noted.  Goal of Therapy:  Heparin level 0.3-0.7 units/ml aPTT 66-102 seconds Monitor platelets by anticoagulation protocol: Yes   Plan:  Decrease heparin to 1550 units/hr Recheck ptt at 1200.  Tyler Deis, Shea Stakes Crowford 09/23/2016,5:32 AM

## 2016-09-23 NOTE — Progress Notes (Addendum)
PROGRESS NOTE                                                                                                                                                                                                             Patient Demographics:    Caitlin Taylor, is a 46 y.o. female, DOB - 29-Aug-1970, TGY:[id number]  Admit date - 09/22/2016   Admitting Physician [name], MD  Outpatient Primary MD for the patient is [name], MD  LOS - 1   Chief Complaint  Patient presents with  . Abdominal Pain     Brief Narrative   46 y.o. female with medical history significant for hypertension and recent diagnosis of DVT and PE and now managed with Eliquis, she was admitted with abdominal wall cellulitis,    Subjective:    [name] today has, No headache, No chest pain,No Nausea, No new weakness tingling or numbness, No Cough - SOB.Reports her abdominal pain is controlled.   Assessment  & Plan :    Principal Problem:   Abdominal wall cellulitis Active Problems:   Normocytic anemia   Morbid obesity due to excess calories (HCC)   CKD (chronic kidney disease), stage III   Benign essential HTN   History of pulmonary embolus (PE)   AKI (acute kidney injury) (HCC)   Chronic diastolic CHF (congestive heart failure) (HCC)   Cellulitis   Abdominal wall cellulitis with possible abscess  - Pt presents with 1 wk of worsening tenderness in lower abd and a couple days of chills of malaise , mild leukocytosis and AKI on presentation, but no fever or tachycardia; CT findings suggest a developing abscess  - Contnue on vancomycin empirically for purulent cellulitis with leukocytosis and AKI  - Gen surgery is consulting and much appreciated; plan to hold Eliquis, cont  heparin infusion in anticipation of possible I&D   - Continue abx, supportive care; follow clinical response   Acute kidney injury superimposed on CKD stage III  - SCr is 2.06 on  admission, baseline 1.3-1.5 - Likely secondary to the acute infection, exacerbated by lisinopril and diuretics  - Hold lisinopril, Lasix, Aldactone, and HCTZ - Check urine studies, repeat chem panel in am   Hx of DVT and PE  - Pt was diagnosed with DVT and b/l PE in February 2018  - She has been  tolerating Eliquis well and there is no suggestion of acute VTE  - In anticipation of likely I&D, Eliquis is held on admission and she has been started on heparin infusion with pharmacy assistance    Chronic diastolic CHF   - Pt appears dry on admission in setting of acute infection  - TTE (09/04/16) with EF 40-98%, grade 1 diastolic dysfunction, no WMA, and no significant valvular disease  - She is managed at home with Lopressor, lisinopril, Lasix, and Aldactone  - Lopressor is continued; Lasix, lisinopril, and Aldactone held on admission in light of AKI  - Follow daily wts and I/O's    Hypertension - Blood pressure is acceptable, continue with metoprolol, continue to hold Lasix lisinopril and Aldactone   finding of thyroid nodule/adrenal adenoma in imaging - Patient reported this has been worked up by her new PCP, ultrasound done recently in outpatient setting   Code Status : Full  Family Communication  : None at bedside  Disposition Plan  : Home when stable  Consults  :  Gen surgery  Procedures  : None  DVT Prophylaxis  :  Heparin GTT  Lab Results  Component Value Date   PLT 155 09/23/2016    Antibiotics  :   Anti-infectives    Start     Dose/Rate Route Frequency Ordered Stop   09/23/16 1000  vancomycin (VANCOCIN) 1,500 mg in sodium chloride 0.9 % 500 mL IVPB     1,500 mg 250 mL/hr over 120 Minutes Intravenous Every 24 hours 09/22/16 1955     09/22/16 2000  vancomycin (VANCOCIN) IVPB 1000 mg/200 mL premix  Status:  Discontinued     1,000 mg 200 mL/hr over 60 Minutes Intravenous  Once 09/22/16 1950 09/22/16 1953   09/22/16 2000  vancomycin (VANCOCIN) 2,000 mg in sodium  chloride 0.9 % 500 mL IVPB     2,000 mg 250 mL/hr over 120 Minutes Intravenous  Once 09/22/16 1955 09/22/16 2222   09/22/16 1245  clindamycin (CLEOCIN) IVPB 600 mg     600 mg 100 mL/hr over 30 Minutes Intravenous  Once 09/22/16 1231 09/22/16 1634        Objective:   Vitals:   09/23/16 0500 09/23/16 0544 09/23/16 0959 09/23/16 1000  BP:  (!) 115/51 116/69   Pulse:  81  83  Resp:  18    Temp:  98.7 F (37.1 C)    TempSrc:  Oral    SpO2:  97%    Weight: 122 kg (268 lb 15.4 oz)     Height:        Wt Readings from Last 3 Encounters:  09/23/16 122 kg (268 lb 15.4 oz)  09/06/16 124.6 kg (274 lb 9.6 oz)  01/21/16 119.7 kg (264 lb)     Intake/Output Summary (Last 24 hours) at 09/23/16 1010 Last data filed at 09/23/16 0800  Gross per 24 hour  Intake           2404.2 ml  Output                0 ml  Net           2404.2 ml     Physical Exam  Awake Alert, Oriented X 3 Supple Neck,No JVD,   Symmetrical Chest wall movement, Good air movement bilaterally, CTAB RRR,No Gallops,Rubs or new Murmurs, No Parasternal Heave +ve B.Sounds, Abd Soft, No rebound - guarding or rigidity, Cellulitis throughout lower right side pannus, some tenderness to palpation No Cyanosis, Clubbing ,  No new Rash or bruise, +1 right lower extremity edema    Data Review:    CBC  Recent Labs Lab 09/22/16 1100 09/23/16 0259  WBC 12.6* 12.8*  HGB 11.3* 9.9*  HCT 33.4* 28.6*  PLT 156 155  MCV 88.8 88.0  MCH 30.1 30.5  MCHC 33.8 34.6  RDW 13.6 14.0  LYMPHSABS 0.6*  --   MONOABS 1.1*  --   EOSABS 0.0  --   BASOSABS 0.0  --     Chemistries   Recent Labs Lab 09/22/16 1100 09/23/16 0547  NA 137 137  K 3.6 3.5  CL 101 104  CO2 27 24  GLUCOSE 113* 114*  BUN 25* 27*  CREATININE 2.06* 2.12*  CALCIUM 9.1 8.5*   ------------------------------------------------------------------------------------------------------------------ No results for input(s): CHOL, HDL, LDLCALC, TRIG, CHOLHDL,  LDLDIRECT in the last 72 hours.  Lab Results  Component Value Date   HGBA1C 5.0 01/06/2016   ------------------------------------------------------------------------------------------------------------------ No results for input(s): TSH, T4TOTAL, T3FREE, THYROIDAB in the last 72 hours.  Invalid input(s): FREET3 ------------------------------------------------------------------------------------------------------------------ No results for input(s): VITAMINB12, FOLATE, FERRITIN, TIBC, IRON, RETICCTPCT in the last 72 hours.  Coagulation profile No results for input(s): INR, PROTIME in the last 168 hours.  No results for input(s): DDIMER in the last 72 hours.  Cardiac Enzymes No results for input(s): CKMB, TROPONINI, MYOGLOBIN in the last 168 hours.  Invalid input(s): CK ------------------------------------------------------------------------------------------------------------------    Component Value Date/Time   BNP 51.8 09/03/2016 2052    Inpatient Medications  Scheduled Meds: . diphenhydrAMINE  50 mg Oral Once  . metoprolol tartrate  12.5 mg Oral BID  . vancomycin  1,500 mg Intravenous Q24H   Continuous Infusions: . heparin 1,550 Units/hr (09/23/16 0535)   PRN Meds:.acetaminophen **OR** acetaminophen, bisacodyl, diphenhydrAMINE, HYDROcodone-acetaminophen, ondansetron **OR** ondansetron (ZOFRAN) IV, polyethylene glycol  Micro Results No results found for this or any previous visit (from the past 240 hour(s)).  Radiology Reports Dg Chest 2 View  Result Date: 09/03/2016 CLINICAL DATA:  Shortness of breath 1 month with cough and chest pain as well as wheezing. EXAM: CHEST  2 VIEW COMPARISON:  None. FINDINGS: Lungs are adequately inflated without focal consolidation or effusion. Cardiomediastinal silhouette is within normal. Remaining bones and soft tissues are within normal. IMPRESSION: No active cardiopulmonary disease. Electronically Signed   By: Marin Olp M.D.   On:  09/03/2016 21:33   Ct Angio Chest Pe W And/or Wo Contrast  Result Date: 09/03/2016 CLINICAL DATA:  Acute onset of shortness of breath with exertion. Bilateral lower extremity swelling. Initial encounter. EXAM: CT ANGIOGRAPHY CHEST WITH CONTRAST TECHNIQUE: Multidetector CT imaging of the chest was performed using the standard protocol during bolus administration of intravenous contrast. Multiplanar CT image reconstructions and MIPs were obtained to evaluate the vascular anatomy. CONTRAST:  80 mL of Isovue 370 IV contrast COMPARISON:  Chest radiograph performed earlier today at 9:20 p.m. FINDINGS: Cardiovascular: There is pulmonary embolus within the right main pulmonary artery, extending into all lobes of the right lung. There is also segmental pulmonary embolus within a pulmonary artery to the left lower lobe. The RV/LV ratio is just above 0.9, corresponding to mild right heart strain and at least submassive pulmonary embolus. The heart is otherwise unremarkable in appearance. The thoracic aorta is unremarkable. No calcific atherosclerotic disease is seen. The vessels are grossly unremarkable. Mediastinum/Nodes: The mediastinum is otherwise unremarkable in appearance. No mediastinal lymphadenopathy is seen. No pericardial effusion is identified. A 2.4 cm exophytic nodule is seen arising at the inferior  aspect of the left thyroid lobe. No axillary lymphadenopathy is seen. Lungs/Pleura: Minimal bibasilar atelectasis is noted. No pleural effusion or pneumothorax is seen. No masses are identified. Upper Abdomen: The visualized portions of the liver and spleen are grossly unremarkable. The visualized portions of the gallbladder, pancreas, right and kidneys are grossly unremarkable. A 2.2 cm left adrenal adenoma is noted. Nonspecific left-sided perinephric stranding is seen. Musculoskeletal: No acute osseous abnormalities are identified. The visualized musculature is unremarkable in appearance. Review of the MIP  images confirms the above findings. IMPRESSION: 1. Pulmonary embolus within the right main pulmonary artery, extending into all lobes of the right lung. Segmental pulmonary embolus within the pulmonary artery to the left lower lobe. CT evidence of right heart strain (RV/LV Ratio = 0.91) consistent with at least submassive (intermediate risk) PE. The presence of right heart strain has been associated with an increased risk of morbidity and mortality. Please activate Code PE by paging [phone]. 2. Minimal bibasilar atelectasis noted.  Lungs otherwise clear. 3. **An incidental finding of potential clinical significance has been found. 2.4 cm exophytic nodule arising at the inferior aspect of the left thyroid lobe. Recommend further evaluation with thyroid ultrasound. If patient is clinically hyperthyroid, consider nuclear medicine thyroid uptake and scan.** 4. 2.2 cm left adrenal adenoma noted. Critical Value/emergent results were called by telephone at the time of interpretation on 09/03/2016 at 11:22 pm to Dr. [name], who verbally acknowledged these results. Electronically Signed   By: Garald Balding M.D.   On: 09/03/2016 23:30   Ct Pelvis Wo Contrast  Result Date: 09/22/2016 CLINICAL DATA:  Red, warm, extremely painful region lower anterior abdominal wall EXAM: CT PELVIS WITHOUT CONTRAST TECHNIQUE: Multidetector CT imaging of the pelvis was performed following the standard protocol without intravenous contrast. COMPARISON:  None. FINDINGS: Urinary Tract:  Unremarkable. Bowel:  Unremarkable. Vascular/Lymphatic: Unremarkable. Reproductive:  Unremarkable. Other:  No free fluid. Musculoskeletal: 4.1 x 5.0 cm irregular soft tissue attenuating lesion is identified in the lower right pannus with surrounding edema/ inflammation and overlying skin thickening. Lesion not fully evaluated given the lack of intravenous contrast material. Bone windows reveal no worrisome lytic or sclerotic osseous lesions. Patient  is status post lower lumbar fusion. IMPRESSION: 1. 4.1 x 5.0 cm irregular soft tissue attenuating lesion in the subcutaneous fat of the lower right pannus. Given clinical history and the overlying skin thickening and adjacent edema/inflammation, this is probably phlegmon/evolving abscess. Subcutaneous hematoma or subcutaneous metastatic lesion cannot be excluded by imaging alone. Electronically Signed   By: Misty Stanley M.D.   On: 09/22/2016 12:23   US Thyroid  Result Date: 09/21/2016 CLINICAL DATA:  Incidental on CT.  Left thyroid nodule noted on CT EXAM: THYROID ULTRASOUND TECHNIQUE: Ultrasound examination of the thyroid gland and adjacent soft tissues was performed. COMPARISON:  None. FINDINGS: Parenchymal Echotexture: Normal Isthmus: 0.4 cm Right lobe: 5.3 x 1.7 x 2.1 cm Left lobe: 5.2 x 1.4 x 2.0 cm _________________________________________________________ Estimated total number of nodules >/= 1 cm: 0 Number of spongiform nodules >/=  2 cm not described below (TR1): 0 Number of mixed cystic and solid nodules >/= 1.5 cm not described below (TR2): 0 _________________________________________________________ Small nodules and cysts measuring 0.5 cm or less are scattered throughout the left lobe and have a benign appearance. IMPRESSION: Tiny nodules and cysts in left lobe were present. None meet criteria for fine needle aspiration biopsy or annual follow-up. The above is in keeping with the ACR TI-RADS recommendations - J Am Coll  Radiol 5003;70:488-891. Electronically Signed   By: Marybelle Killings M.D.   On: 09/21/2016 15:28    ELGERGAWY, DAWOOD M.D on 09/23/2016 at 10:10 AM  Between 7am to 7pm - Pager - [phone]  After 7pm go to [email or web address] - password [facility]  [facility] -  Office  [phone]

## 2016-09-23 NOTE — Progress Notes (Addendum)
Caitlin Taylor for IV heparin Indication: recent PE/DVT, on apixaban prior to admission.  No Known Allergies  Patient Measurements: Height: 5\' 3"  (160 cm) Weight: 268 lb 15.4 oz (122 kg) IBW/kg (Calculated) : 52.4 Heparin Dosing Weight: 82.7 kg  Vital Signs: Temp: 98.7 F (37.1 C) (03/15 0544) Temp Source: Oral (03/15 0544) BP: 116/69 (03/15 0959) Pulse Rate: 83 (03/15 1000)  Labs:  Recent Labs  09/22/16 1100 09/22/16 2126 09/23/16 0259 09/23/16 0547 09/23/16 1141  HGB 11.3*  --  9.9*  --   --   HCT 33.4*  --  28.6*  --   --   PLT 156  --  155  --   --   APTT  --  33 108*  --  144*  HEPARINUNFRC  --  >2.20* >2.20*  --   --   CREATININE 2.06*  --   --  2.12*  --     Estimated Creatinine Clearance: 42.4 mL/min (A) (by C-G formula based on SCr of 2.12 mg/dL (H)).   Medical History: Past Medical History:  Diagnosis Date  . Chronic back pain   . CKD (chronic kidney disease) stage 2, GFR 60-89 ml/min   . DVT (deep venous thrombosis) ([place])   . Hypertension   . Lumbar stenosis   . Pulmonary embolism ([place])    a. diagnosed in 08/2016. Started on Eliquis    Assessment: 31 yoF with PMH HTN, recent bilateral PE with DVT diagnosed 09/03/16 and currently on Eliquis, presents to [facility] with abdominal wall cellulitis with possible abscess. Transferred to [facility] with Surgery to evaluate for I&D. Converting Eliquis to heparin per pharmacy in anticipation of possible surgery.   Baseline heparin level, aPTT: pending  Prior anticoagulation: Eliquis 5 mg bid, last dose 3/13 at 2000  AKI noted on admission with SCr 2.06 (baseline ~1.3)  Significant events:  Today, 09/23/2016:  aPTT 144 seconds, increased despite reducing infusion to 1550 units/hr; using aPTT to titrate heparin until effects of recent Eliquis use on heparin levels has diminished  CBC: hgb low, Plt wnl  No bleeding or infusion issues per nursing  CrCl: 43 ml/min  Goal of  Therapy: aPTT 66-102 seconds Heparin level 0.3-0.7 units/ml Monitor platelets by anticoagulation protocol: Yes  Plan:  Reduce heparin infusion to 1300 units/hr  Check aPTT 6 hrs after rate adjustment.   Daily CBC, daily heparin level  Monitor for signs of bleeding or thrombosis  Regarding timing of procedures, for patients with CrCl 30-50 ml/min, ACCP recommends holding Eliquis 3 days for low-risk procedures, and 4 days for high-risk procedures.   Hershal Coria, PharmD, BCPS Pager: [phone] 09/23/2016 12:37 PM

## 2016-09-23 NOTE — Progress Notes (Signed)
Patient ID: Caitlin Taylor, female   DOB: 12/07/1970, 46 y.o.   MRN: [id number]  [facility] Surgery Progress Note     Subjective: Started on vancomycin last night. No change in pain or cellulitis this morning. Pain medication does help. States that she has never had anything like this before.  Objective: Vital signs in last 24 hours: Temp:  [98.4 F (36.9 C)-98.7 F (37.1 C)] 98.7 F (37.1 C) (03/15 0544) Pulse Rate:  [76-92] 83 (03/15 1000) Resp:  [18-20] 18 (03/15 0544) BP: (101-152)/(46-102) 116/69 (03/15 0959) SpO2:  [96 %-100 %] 97 % (03/15 0544) Weight:  [268 lb 15.4 oz (122 kg)] 268 lb 15.4 oz (122 kg) (03/15 0500) Last BM Date: 09/21/16  Intake/Output from previous day: 03/14 0701 - 03/15 0700 In: 2164.2 [P.O.:250; I.V.:814.2; IV Piggyback:1100] Out: -  Intake/Output this shift: Total I/O In: 240 [P.O.:240] Out: -   PE: Gen:  Alert, NAD, pleasant Card:  RRR, no M/G/R heard Pulm:  CTAB, no W/R/R, effort normal Abd: obese, soft, ND, +BS, no HSM, cellulitis throughout lower right side of panus with no active drainage Ext:  No erythema, edema, or tenderness   Lab Results:   Recent Labs  09/22/16 1100 09/23/16 0259  WBC 12.6* 12.8*  HGB 11.3* 9.9*  HCT 33.4* 28.6*  PLT 156 155   BMET  Recent Labs  09/22/16 1100 09/23/16 0547  NA 137 137  K 3.6 3.5  CL 101 104  CO2 27 24  GLUCOSE 113* 114*  BUN 25* 27*  CREATININE 2.06* 2.12*  CALCIUM 9.1 8.5*   PT/INR No results for input(s): LABPROT, INR in the last 72 hours. CMP     Component Value Date/Time   NA 137 09/23/2016 0547   K 3.5 09/23/2016 0547   CL 104 09/23/2016 0547   CO2 24 09/23/2016 0547   GLUCOSE 114 (H) 09/23/2016 0547   BUN 27 (H) 09/23/2016 0547   CREATININE 2.12 (H) 09/23/2016 0547   CALCIUM 8.5 (L) 09/23/2016 0547   PROT 6.3 (L) 09/04/2016 0418   ALBUMIN 3.5 09/04/2016 0418   AST 34 09/04/2016 0418   ALT 52 09/04/2016 0418   ALKPHOS 42 09/04/2016 0418   BILITOT 0.5  09/04/2016 0418   GFRNONAA 27 (L) 09/23/2016 0547   GFRAA 31 (L) 09/23/2016 0547   Lipase  No results found for: LIPASE     Studies/Results: Ct Pelvis Wo Contrast  Result Date: 09/22/2016 CLINICAL DATA:  Red, warm, extremely painful region lower anterior abdominal wall EXAM: CT PELVIS WITHOUT CONTRAST TECHNIQUE: Multidetector CT imaging of the pelvis was performed following the standard protocol without intravenous contrast. COMPARISON:  None. FINDINGS: Urinary Tract:  Unremarkable. Bowel:  Unremarkable. Vascular/Lymphatic: Unremarkable. Reproductive:  Unremarkable. Other:  No free fluid. Musculoskeletal: 4.1 x 5.0 cm irregular soft tissue attenuating lesion is identified in the lower right pannus with surrounding edema/ inflammation and overlying skin thickening. Lesion not fully evaluated given the lack of intravenous contrast material. Bone windows reveal no worrisome lytic or sclerotic osseous lesions. Patient is status post lower lumbar fusion. IMPRESSION: 1. 4.1 x 5.0 cm irregular soft tissue attenuating lesion in the subcutaneous fat of the lower right pannus. Given clinical history and the overlying skin thickening and adjacent edema/inflammation, this is probably phlegmon/evolving abscess. Subcutaneous hematoma or subcutaneous metastatic lesion cannot be excluded by imaging alone. Electronically Signed   By: Misty Stanley M.D.   On: 09/22/2016 12:23   US Thyroid  Result Date: 09/21/2016 CLINICAL DATA:  Incidental  on CT.  Left thyroid nodule noted on CT EXAM: THYROID ULTRASOUND TECHNIQUE: Ultrasound examination of the thyroid gland and adjacent soft tissues was performed. COMPARISON:  None. FINDINGS: Parenchymal Echotexture: Normal Isthmus: 0.4 cm Right lobe: 5.3 x 1.7 x 2.1 cm Left lobe: 5.2 x 1.4 x 2.0 cm _________________________________________________________ Estimated total number of nodules >/= 1 cm: 0 Number of spongiform nodules >/=  2 cm not described below (TR1): 0 Number of mixed  cystic and solid nodules >/= 1.5 cm not described below (TR2): 0 _________________________________________________________ Small nodules and cysts measuring 0.5 cm or less are scattered throughout the left lobe and have a benign appearance. IMPRESSION: Tiny nodules and cysts in left lobe were present. None meet criteria for fine needle aspiration biopsy or annual follow-up. The above is in keeping with the ACR TI-RADS recommendations - J Am Coll Radiol 2017;14:587-595. Electronically Signed   By: Marybelle Killings M.D.   On: 09/21/2016 15:28    Anti-infectives: Anti-infectives    Start     Dose/Rate Route Frequency Ordered Stop   09/23/16 1000  vancomycin (VANCOCIN) 1,500 mg in sodium chloride 0.9 % 500 mL IVPB     1,500 mg 250 mL/hr over 120 Minutes Intravenous Every 24 hours 09/22/16 1955     09/22/16 2000  vancomycin (VANCOCIN) IVPB 1000 mg/200 mL premix  Status:  Discontinued     1,000 mg 200 mL/hr over 60 Minutes Intravenous  Once 09/22/16 1950 09/22/16 1953   09/22/16 2000  vancomycin (VANCOCIN) 2,000 mg in sodium chloride 0.9 % 500 mL IVPB     2,000 mg 250 mL/hr over 120 Minutes Intravenous  Once 09/22/16 1955 09/22/16 2222   09/22/16 1245  clindamycin (CLEOCIN) IVPB 600 mg     600 mg 100 mL/hr over 30 Minutes Intravenous  Once 09/22/16 1231 09/22/16 1634       Assessment/Plan Abdominal wall cellulitis with phlegmon - CT scan shows 4.1 x 5.0 cm irregular soft tissue attenuating lesion in the subcutaneous fat of the lower right pannus, likely a phlegmon/evolving abscess - WBC 12.6 on arrival, afebrile  Recent diagnosis PE started on Eliquis about 2 weeks ago - hold eliquis  ID - vancomycin 3/14>> FEN - regular diet VTE - SCDs, heparin  Plan - continue IV antibiotics. May consider repeating CT scan if patient does not clinically improve to ensure cellulitis is improving and no abscess is forming. Continue to hold eliquis. Will continue to follow.   LOS: 1 day    Jerrye Beavers  , [facility] Surgery 09/23/2016, 11:08 AM Pager: [phone] Consults: [phone] Mon-Fri 7:00 am-4:30 pm Sat-Sun 7:00 am-11:30 am

## 2016-09-23 NOTE — Progress Notes (Signed)
[place] for IV heparin Indication: recent PE/DVT, on apixaban prior to admission.  No Known Allergies  Patient Measurements: Height: 5\' 3"  (160 cm) Weight: 268 lb 15.4 oz (122 kg) IBW/kg (Calculated) : 52.4 Heparin Dosing Weight: 82.7 kg  Vital Signs: Temp: 97.6 F (36.4 C) (03/15 1409) Temp Source: Oral (03/15 1409) BP: 132/71 (03/15 1409) Pulse Rate: 76 (03/15 1409)  Labs:  Recent Labs  09/22/16 1100  09/22/16 2126 09/23/16 0259 09/23/16 0547 09/23/16 1141 09/23/16 1919  HGB 11.3*  --   --  9.9*  --   --   --   HCT 33.4*  --   --  28.6*  --   --   --   PLT 156  --   --  155  --   --   --   APTT  --   < > 33 108*  --  144* 77*  HEPARINUNFRC  --   --  >2.20* >2.20*  --   --   --   CREATININE 2.06*  --   --   --  2.12*  --   --   < > = values in this interval not displayed.  Estimated Creatinine Clearance: 42.4 mL/min (A) (by C-G formula based on SCr of 2.12 mg/dL (H)).   Medical History: Past Medical History:  Diagnosis Date  . Chronic back pain   . CKD (chronic kidney disease) stage 2, GFR 60-89 ml/min   . DVT (deep venous thrombosis) ([place])   . Hypertension   . Lumbar stenosis   . Pulmonary embolism ([place])    a. diagnosed in 08/2016. Started on Eliquis    Assessment: 70 yoF with PMH HTN, recent bilateral PE with DVT diagnosed 09/03/16 and currently on Eliquis, presents to [facility] with abdominal wall cellulitis with possible abscess. Transferred to [facility] with Surgery to evaluate for I&D. Converting Eliquis to heparin per pharmacy in anticipation of possible surgery.   Baseline heparin level, aPTT: pending  Prior anticoagulation: Eliquis 5 mg bid, last dose 3/13 at 2000  AKI noted on admission with SCr 2.06 (baseline ~1.3)  Significant events:  Today, 09/23/2016:  aPTT now at goal on current rate of 1300 units/hr  CBC: hgb low, Plt wnl  No bleeding or infusion issues per nursing  CrCl: 43 ml/min  Goal of  Therapy: aPTT 66-102 seconds Heparin level 0.3-0.7 units/ml Monitor platelets by anticoagulation protocol: Yes  Plan:  Continue heparin infusion to 1300 units/hr  With this being the first therapeutic aPTT, will recheck aPTT in 6 hours    Daily CBC, daily heparin level  Monitor for signs of bleeding or thrombosis  Regarding timing of procedures, for patients with CrCl 30-50 ml/min, ACCP recommends holding Eliquis 3 days for low-risk procedures, and 4 days for high-risk procedures.    Adrian Saran, PharmD, BCPS Pager [phone] 09/23/2016 8:20 PM

## 2016-09-24 ENCOUNTER — Inpatient Hospital Stay (HOSPITAL_COMMUNITY): Payer: 59

## 2016-09-24 LAB — SODIUM, URINE, RANDOM: SODIUM UR: 35 mmol/L

## 2016-09-24 LAB — COMPREHENSIVE METABOLIC PANEL
ALT: 29 U/L (ref 14–54)
AST: 20 U/L (ref 15–41)
Albumin: 3.3 g/dL — ABNORMAL LOW (ref 3.5–5.0)
Alkaline Phosphatase: 49 U/L (ref 38–126)
Anion gap: 8 (ref 5–15)
BILIRUBIN TOTAL: 0.6 mg/dL (ref 0.3–1.2)
BUN: 25 mg/dL — AB (ref 6–20)
CALCIUM: 8.7 mg/dL — AB (ref 8.9–10.3)
CHLORIDE: 105 mmol/L (ref 101–111)
CO2: 23 mmol/L (ref 22–32)
CREATININE: 2.29 mg/dL — AB (ref 0.44–1.00)
GFR calc Af Amer: 29 mL/min — ABNORMAL LOW (ref >60)
GFR, EST NON AFRICAN AMERICAN: 25 mL/min — AB (ref >60)
Glucose, Bld: 143 mg/dL — ABNORMAL HIGH (ref 65–99)
Potassium: 3.5 mmol/L (ref 3.5–5.1)
Sodium: 136 mmol/L (ref 135–145)
TOTAL PROTEIN: 6.6 g/dL (ref 6.5–8.1)

## 2016-09-24 LAB — HEPARIN LEVEL (UNFRACTIONATED): Heparin Unfractionated: >2.2 IU/mL — ABNORMAL HIGH (ref 0.30–0.70)

## 2016-09-24 LAB — CBC
HCT: 28.3 % — ABNORMAL LOW (ref 36.0–46.0)
HEMOGLOBIN: 9.6 g/dL — AB (ref 12.0–15.0)
MCH: 29.3 pg (ref 26.0–34.0)
MCHC: 33.9 g/dL (ref 30.0–36.0)
MCV: 86.3 fL (ref 78.0–100.0)
Platelets: 155 10*3/uL (ref 150–400)
RBC: 3.28 MIL/uL — ABNORMAL LOW (ref 3.87–5.11)
RDW: 13.7 % (ref 11.5–15.5)
WBC: 12.3 10*3/uL — AB (ref 4.0–10.5)

## 2016-09-24 LAB — APTT: APTT: 88 s — AB (ref 24–36)

## 2016-09-24 LAB — CREATININE, SERUM
Creatinine, Ser: 2.41 mg/dL — ABNORMAL HIGH (ref 0.44–1.00)
GFR calc non Af Amer: 23 mL/min — ABNORMAL LOW (ref >60)
GFR, EST AFRICAN AMERICAN: 27 mL/min — AB (ref >60)

## 2016-09-24 LAB — CREATININE, URINE, RANDOM: CREATININE, URINE: 127.12 mg/dL

## 2016-09-24 LAB — GLUCOSE, CAPILLARY: GLUCOSE-CAPILLARY: 113 mg/dL — AB (ref 65–99)

## 2016-09-24 LAB — CK: Total CK: 29 U/L — ABNORMAL LOW (ref 38–234)

## 2016-09-24 LAB — VANCOMYCIN, RANDOM: VANCOMYCIN RM: 27

## 2016-09-24 MED ORDER — SODIUM CHLORIDE 0.9 % IV SOLN
INTRAVENOUS | Status: DC
  Administered 2016-09-24: 10:00:00 via INTRAVENOUS

## 2016-09-24 MED ORDER — DEXTROSE 5 % IV SOLN
100.0000 mg | Freq: Two times a day (BID) | INTRAVENOUS | Status: DC
  Administered 2016-09-24 – 2016-09-30 (×14): 100 mg via INTRAVENOUS
  Filled 2016-09-24 (×14): qty 100

## 2016-09-24 MED ORDER — CEFTRIAXONE SODIUM 1 G IJ SOLR
1.0000 g | INTRAMUSCULAR | Status: DC
  Administered 2016-09-24 – 2016-09-30 (×7): 1 g via INTRAVENOUS
  Filled 2016-09-24 (×7): qty 10

## 2016-09-24 NOTE — Progress Notes (Signed)
Patient ID: Caitlin Taylor, female   DOB: 1971-06-13, 46 y.o.   MRN: [id number]  [facility] Surgery Progress Note     Subjective: No change; no better, no worse. States that area of cellulitis is still very tender. She does not feel that it is spreading.  Objective: Vital signs in last 24 hours: Temp:  [97.6 F (36.4 C)-99.6 F (37.6 C)] 99.6 F (37.6 C) (03/16 0839) Pulse Rate:  [76-83] 81 (03/16 0839) Resp:  [18-19] 19 (03/16 0839) BP: (108-132)/(53-71) 125/69 (03/16 0839) SpO2:  [95 %-97 %] 97 % (03/16 0839) Last BM Date: 09/21/16  Intake/Output from previous day: 03/15 0701 - 03/16 0700 In: 1587.8 [P.O.:720; I.V.:367.8; IV Piggyback:500] Out: 500 [Urine:500] Intake/Output this shift: No intake/output data recorded.  PE: Gen:  Alert, NAD, pleasant Pulm:  effort normal Abd: obese, soft, ND, +BS, no HSM, cellulitis throughout lower right side of panus with no active drainage Ext:  No erythema, edema, or tenderness    Lab Results:   Recent Labs  09/23/16 0259 09/24/16 0540  WBC 12.8* 12.3*  HGB 9.9* 9.6*  HCT 28.6* 28.3*  PLT 155 155   BMET  Recent Labs  09/22/16 1100 09/23/16 0547 09/24/16 0540  NA 137 137  --   K 3.6 3.5  --   CL 101 104  --   CO2 27 24  --   GLUCOSE 113* 114*  --   BUN 25* 27*  --   CREATININE 2.06* 2.12* 2.41*  CALCIUM 9.1 8.5*  --    PT/INR No results for input(s): LABPROT, INR in the last 72 hours. CMP     Component Value Date/Time   NA 137 09/23/2016 0547   K 3.5 09/23/2016 0547   CL 104 09/23/2016 0547   CO2 24 09/23/2016 0547   GLUCOSE 114 (H) 09/23/2016 0547   BUN 27 (H) 09/23/2016 0547   CREATININE 2.41 (H) 09/24/2016 0540   CALCIUM 8.5 (L) 09/23/2016 0547   PROT 6.3 (L) 09/04/2016 0418   ALBUMIN 3.5 09/04/2016 0418   AST 34 09/04/2016 0418   ALT 52 09/04/2016 0418   ALKPHOS 42 09/04/2016 0418   BILITOT 0.5 09/04/2016 0418   GFRNONAA 23 (L) 09/24/2016 0540   GFRAA 27 (L) 09/24/2016 0540   Lipase   No results found for: LIPASE     Studies/Results: Ct Pelvis Wo Contrast  Result Date: 09/22/2016 CLINICAL DATA:  Red, warm, extremely painful region lower anterior abdominal wall EXAM: CT PELVIS WITHOUT CONTRAST TECHNIQUE: Multidetector CT imaging of the pelvis was performed following the standard protocol without intravenous contrast. COMPARISON:  None. FINDINGS: Urinary Tract:  Unremarkable. Bowel:  Unremarkable. Vascular/Lymphatic: Unremarkable. Reproductive:  Unremarkable. Other:  No free fluid. Musculoskeletal: 4.1 x 5.0 cm irregular soft tissue attenuating lesion is identified in the lower right pannus with surrounding edema/ inflammation and overlying skin thickening. Lesion not fully evaluated given the lack of intravenous contrast material. Bone windows reveal no worrisome lytic or sclerotic osseous lesions. Patient is status post lower lumbar fusion. IMPRESSION: 1. 4.1 x 5.0 cm irregular soft tissue attenuating lesion in the subcutaneous fat of the lower right pannus. Given clinical history and the overlying skin thickening and adjacent edema/inflammation, this is probably phlegmon/evolving abscess. Subcutaneous hematoma or subcutaneous metastatic lesion cannot be excluded by imaging alone. Electronically Signed   By: Misty Stanley M.D.   On: 09/22/2016 12:23    Anti-infectives: Anti-infectives    Start     Dose/Rate Route Frequency Ordered Stop  09/24/16 1000  doxycycline (VIBRAMYCIN) 100 mg in dextrose 5 % 250 mL IVPB     100 mg 125 mL/hr over 120 Minutes Intravenous Every 12 hours 09/24/16 0744     09/24/16 1000  cefTRIAXone (ROCEPHIN) 1 g in dextrose 5 % 50 mL IVPB     1 g 100 mL/hr over 30 Minutes Intravenous Every 24 hours 09/24/16 0926     09/23/16 1000  vancomycin (VANCOCIN) 1,500 mg in sodium chloride 0.9 % 500 mL IVPB  Status:  Discontinued     1,500 mg 250 mL/hr over 120 Minutes Intravenous Every 24 hours 09/22/16 1955 09/24/16 0735   09/22/16 2000  vancomycin  (VANCOCIN) IVPB 1000 mg/200 mL premix  Status:  Discontinued     1,000 mg 200 mL/hr over 60 Minutes Intravenous  Once 09/22/16 1950 09/22/16 1953   09/22/16 2000  vancomycin (VANCOCIN) 2,000 mg in sodium chloride 0.9 % 500 mL IVPB     2,000 mg 250 mL/hr over 120 Minutes Intravenous  Once 09/22/16 1955 09/22/16 2222   09/22/16 1245  clindamycin (CLEOCIN) IVPB 600 mg     600 mg 100 mL/hr over 30 Minutes Intravenous  Once 09/22/16 1231 09/22/16 1634       Assessment/Plan Abdominal wall cellulitis with phlegmon - CT scan shows 4.1 x 5.0 cm irregular soft tissue attenuating lesion in the subcutaneous fat of the lower right pannus, likely a phlegmon/evolving abscess - WBC 12.6 on arrival, afebrile - WBC 12.3 today  AKI - Cr up to 2.41 today. renal u/s pending. CKD stage III Recent diagnosis PE started on Eliquis about 2 weeks ago - hold eliquis Chronic diastolic CHF HTN  ID - vancomycin 3/14>> FEN - regular diet VTE - SCDs, heparin  Plan - continue IV antibiotics and monitor. May consider repeating CT scan if patient does not clinically improve. Continue to hold eliquis.   LOS: 2 days    Jerrye Beavers , [facility] Surgery 09/24/2016, 9:33 AM Pager: [phone] Consults: [phone] Mon-Fri 7:00 am-4:30 pm Sat-Sun 7:00 am-11:30 am

## 2016-09-24 NOTE — Consult Note (Signed)
Reason for Consult: AKI/CKD Referring Physician:  Waldron Labs, MD  Caitlin Taylor is an 46 y.o. female.  HPI: Pt has a PMH significant for HTN, morbid obesity, DVT with PE on Eliquis, and CKD stage 2-3 who was transferred from [facility] due to worsening lower abdominal pain and redness.  She was felt to have an abdominal wall cellulitis/panniculitis with abscess and started on IV antibiotics (including 3.5 grams of Vancomycin, clindamycin, doxycycline, and most recently, rocephin).  Her Scr was elevated at 2.06 at time of admission and has continued to climb to 2.41 today.  We were consulted to further evaluate and manage her AKI/CKD.  The trend in Scr is seen below.  Of note, she had been on lisinopril, aldactone, and HCTZ prior to admission (currently on hold since admission).   She admits that she was told that she had CKD last year during an hospital admission but is adopted and does not know anything about her family history.  She does state that she was told she has sickle cell trait.  She did not have any pre-eclampsia with her 2 pregnancies and denies any NSAIDs or COX-II I's.   Trend in Creatinine: Creatinine, Ser  Date/Time Value Ref Range Status  09/24/2016 05:40 AM 2.41 (H) 0.44 - 1.00 mg/dL Final  09/23/2016 05:47 AM 2.12 (H) 0.44 - 1.00 mg/dL Final  09/22/2016 11:00 AM 2.06 (H) 0.44 - 1.00 mg/dL Final  09/06/2016 03:48 AM 1.33 (H) 0.44 - 1.00 mg/dL Final  09/05/2016 04:46 AM 1.39 (H) 0.44 - 1.00 mg/dL Final  09/04/2016 04:18 AM 1.47 (H) 0.44 - 1.00 mg/dL Final  09/03/2016 08:50 PM 1.53 (H) 0.44 - 1.00 mg/dL Final  01/19/2016 11:28 AM 1.16 (H) 0.44 - 1.00 mg/dL Final  01/19/2016 12:37 AM 1.19 (H) 0.44 - 1.00 mg/dL Final  01/16/2016 06:10 AM 1.05 (H) 0.44 - 1.00 mg/dL Final  01/12/2016 06:42 PM 1.26 (H) 0.44 - 1.00 mg/dL Final  01/12/2016 04:35 AM 1.20 (H) 0.44 - 1.00 mg/dL Final  01/11/2016 01:35 PM 1.28 (H) 0.44 - 1.00 mg/dL Final  01/10/2016 10:05 AM 1.28 (H)  0.44 - 1.00 mg/dL Final  01/09/2016 06:07 AM 1.42 (H) 0.44 - 1.00 mg/dL Final  01/08/2016 05:46 AM 1.61 (H) 0.44 - 1.00 mg/dL Final  01/07/2016 06:21 AM 1.61 (H) 0.44 - 1.00 mg/dL Final  01/06/2016 12:33 PM 1.32 (H) 0.44 - 1.00 mg/dL Final  01/05/2016 07:16 AM 1.27 (H) 0.44 - 1.00 mg/dL Final  01/04/2016 11:04 PM 1.17 (H) 0.44 - 1.00 mg/dL Final    PMH:   Past Medical History:  Diagnosis Date  . Chronic back pain   . CKD (chronic kidney disease) stage 2, GFR 60-89 ml/min   . DVT (deep venous thrombosis) ([place])   . Hypertension   . Lumbar stenosis   . Pulmonary embolism ([place])    a. diagnosed in 08/2016. Started on Eliquis    PSH:   Past Surgical History:  Procedure Laterality Date  . CESAREAN SECTION    . MAXIMUM ACCESS (MAS)POSTERIOR LUMBAR INTERBODY FUSION (PLIF) 1 LEVEL N/A 01/06/2016   Procedure: FOR MAXIMUM ACCESS (MAS) POSTERIOR LUMBAR INTERBODY FUSION (PLIF) LUMBAR FOUR-FIVE;  Surgeon: Kevan Ny Ditty, MD;  Location: [facility];  Service: Neurosurgery;  Laterality: N/A;    Allergies: No Known Allergies  Medications:   Prior to Admission medications   Medication Sig Start Date End Date Taking? Authorizing Provider  acetaminophen (TYLENOL) 500 MG tablet Take 500 mg by mouth every 6 (six) hours  as needed (pain).   Yes Historical Provider, MD  apixaban (ELIQUIS) 5 MG TABS tablet Take 1 tablet (5 mg total) by mouth 2 (two) times daily. Please take 10 mg twice a day for 7 days and then 5 mg twice a day Patient taking differently: Take 5 mg by mouth 2 (two) times daily.  09/12/16  Yes Dron Tanna Furry, MD  diphenhydramine-acetaminophen (TYLENOL PM) 25-500 MG TABS tablet Take 1 tablet by mouth at bedtime as needed (pain).   Yes Historical Provider, MD  furosemide (LASIX) 80 MG tablet Take 1 tablet (80 mg total) by mouth daily. 09/06/16  Yes Dron Tanna Furry, MD  lisinopril-hydrochlorothiazide (PRINZIDE,ZESTORETIC) 20-25 MG tablet Take 1 tablet by mouth daily. 08/10/16   Yes Historical Provider, MD  metoprolol tartrate (LOPRESSOR) 25 MG tablet Take 0.5 tablets (12.5 mg total) by mouth 2 (two) times daily. 09/06/16  Yes Dron Tanna Furry, MD  spironolactone (ALDACTONE) 25 MG tablet Take 25 mg by mouth daily. 08/26/16  Yes Historical Provider, MD  Vitamin D, Ergocalciferol, (DRISDOL) 50000 units CAPS capsule Take 50,000 Units by mouth every Monday. 08/09/16  Yes Historical Provider, MD    Inpatient medications: . cefTRIAXone (ROCEPHIN)  IV  1 g Intravenous Q24H  . diphenhydrAMINE  50 mg Oral Once  . doxycycline (VIBRAMYCIN) IV  100 mg Intravenous Q12H  . metoprolol tartrate  12.5 mg Oral BID    Discontinued Meds:   Medications Discontinued During This Encounter  Medication Reason  . norgestimate-ethinyl estradiol (MONONESSA) 0.25-35 MG-MCG tablet Error  . vitamin C (VITAMIN C) 250 MG tablet Error  . MONONESSA 0.25-35 MG-MCG tablet Discontinued by provider  . vancomycin (VANCOCIN) IVPB 1000 mg/200 mL premix Dose change  . heparin ADULT infusion 100 units/mL (25000 units/296mL sodium chloride 0.45%)   . vancomycin (VANCOCIN) 1,500 mg in sodium chloride 0.9 % 500 mL IVPB     Social History:  reports that she has never smoked. She has never used smokeless tobacco. She reports that she does not drink alcohol or use drugs.  Family History:   Family History  Problem Relation Age of Onset  . Congestive Heart Failure Father     Pertinent items are noted in HPI. Weight change:   Intake/Output Summary (Last 24 hours) at 09/24/16 1056 Last data filed at 09/24/16 0900  Gross per 24 hour  Intake           847.77 ml  Output             1100 ml  Net          -252.23 ml   BP 125/69 (BP Location: Left Arm)   Pulse 81   Temp 99.6 F (37.6 C) (Axillary)   Resp 19   Ht 5\' 3"  (1.6 m)   Wt 122 kg (268 lb 15.4 oz)   LMP 09/05/2016 Comment: Negative u-preg  SpO2 97%   BMI 47.64 kg/m  Vitals:   09/23/16 2044 09/23/16 2300 09/24/16 0639 09/24/16 0839  BP:  (!) 108/53 110/65 123/67 125/69  Pulse: 81 81 76 81  Resp: 18  18 19   Temp: 98.6 F (37 C)  98.4 F (36.9 C) 99.6 F (37.6 C)  TempSrc: Oral  Oral Axillary  SpO2: 96%  95% 97%  Weight:      Height:         General appearance: alert, cooperative and no distress Head: Normocephalic, without obvious abnormality, atraumatic Resp: clear to auscultation bilaterally Cardio: regular rate and rhythm, S1, S2 normal,  no murmur, click, rub or gallop GI: obese, +BS, right suprapubic area markedly edemetous/indurated, with erythema and warmth to touch, very tender and firm  Extremities: edema trace  Labs: Basic Metabolic Panel:  Recent Labs Lab 09/22/16 1100 09/23/16 0547 09/24/16 0540  NA 137 137  --   K 3.6 3.5  --   CL 101 104  --   CO2 27 24  --   GLUCOSE 113* 114*  --   BUN 25* 27*  --   CREATININE 2.06* 2.12* 2.41*  CALCIUM 9.1 8.5*  --    Liver Function Tests: No results for input(s): AST, ALT, ALKPHOS, BILITOT, PROT, ALBUMIN in the last 168 hours. No results for input(s): LIPASE, AMYLASE in the last 168 hours. No results for input(s): AMMONIA in the last 168 hours. CBC:  Recent Labs Lab 09/22/16 1100 09/23/16 0259 09/24/16 0540  WBC 12.6* 12.8* 12.3*  NEUTROABS 10.9*  --   --   HGB 11.3* 9.9* 9.6*  HCT 33.4* 28.6* 28.3*  MCV 88.8 88.0 86.3  PLT 156 155 155   PT/INR: @LABRCNTIP (inr:5) Cardiac Enzymes: )No results for input(s): CKTOTAL, CKMB, CKMBINDEX, TROPONINI in the last 168 hours. CBG:  Recent Labs Lab 09/23/16 0742 09/24/16 0728  GLUCAP 125* 113*    Iron Studies: No results for input(s): IRON, TIBC, TRANSFERRIN, FERRITIN in the last 168 hours.  Xrays/Other Studies: Ct Pelvis Wo Contrast  Result Date: 09/22/2016 CLINICAL DATA:  Red, warm, extremely painful region lower anterior abdominal wall EXAM: CT PELVIS WITHOUT CONTRAST TECHNIQUE: Multidetector CT imaging of the pelvis was performed following the standard protocol without intravenous contrast.  COMPARISON:  None. FINDINGS: Urinary Tract:  Unremarkable. Bowel:  Unremarkable. Vascular/Lymphatic: Unremarkable. Reproductive:  Unremarkable. Other:  No free fluid. Musculoskeletal: 4.1 x 5.0 cm irregular soft tissue attenuating lesion is identified in the lower right pannus with surrounding edema/ inflammation and overlying skin thickening. Lesion not fully evaluated given the lack of intravenous contrast material. Bone windows reveal no worrisome lytic or sclerotic osseous lesions. Patient is status post lower lumbar fusion. IMPRESSION: 1. 4.1 x 5.0 cm irregular soft tissue attenuating lesion in the subcutaneous fat of the lower right pannus. Given clinical history and the overlying skin thickening and adjacent edema/inflammation, this is probably phlegmon/evolving abscess. Subcutaneous hematoma or subcutaneous metastatic lesion cannot be excluded by imaging alone. Electronically Signed   By: Misty Stanley M.D.   On: 09/22/2016 12:23     Assessment/Plan: 1.  AKI/CKD- in setting of acute illness (cellulitis) antibiotics (large dose of Vanco) and concomitant ACE/HCTZ use with likely intravascular volume depletion.  She did have some microscopic hematuria and mild proteinuria and pyuria.  Will order vanc trough level, spep, urine Eos, and serologies for acute GN.  Likely due to ischemic ATN due to infection/volume depletion/ACE- inhibition.  Will continue to follow. 2. Cellulitis/panniculitis with abscess- on rocephin per primary and surgery following for possible I&D of abscess 3. HTN- off ACE/HCTZ and aldactone. BP's a little low.  Continue to follow. 4. Obesity 5. Diastolic CHF 6. H/o DVT and PE- possible pulmonary HTN/OSA, will need outpatient sleep study as given her body habitus she has a high pre-test probability.   Governor Rooks Dawsen Krieger 09/24/2016, 10:56 AM

## 2016-09-24 NOTE — Progress Notes (Signed)
ANTICOAGULATION CONSULT NOTE - Follow Up Consult  Pharmacy Consult for Heparin Indication: recent PE/DVT, on apixaban prior to admission.  No Known Allergies  Patient Measurements: Height: 5\' 3"  (160 cm) Weight: 268 lb 15.4 oz (122 kg) IBW/kg (Calculated) : 52.4 Heparin Dosing Weight:   Vital Signs: Temp: 98.6 F (37 C) (03/15 2044) Temp Source: Oral (03/15 2044) BP: 110/65 (03/15 2300) Pulse Rate: 81 (03/15 2300)  Labs:  Recent Labs  09/22/16 1100  09/22/16 2126 09/23/16 0259 09/23/16 0547 09/23/16 1141 09/23/16 1919 09/24/16 0100 09/24/16 0115  HGB 11.3*  --   --  9.9*  --   --   --   --   --   HCT 33.4*  --   --  28.6*  --   --   --   --   --   PLT 156  --   --  155  --   --   --   --   --   APTT  --   < > 33 108*  --  144* 77*  --  88*  HEPARINUNFRC  --   --  >2.20* >2.20*  --   --   --  >2.20*  --   CREATININE 2.06*  --   --   --  2.12*  --   --   --   --   < > = values in this interval not displayed.  Estimated Creatinine Clearance: 42.4 mL/min (A) (by C-G formula based on SCr of 2.12 mg/dL (H)).   Medications:  Infusions:  . heparin 1,300 Units/hr (09/23/16 1815)    Assessment: Patient with high heparin level but PTT at goal.  PTT ordered with Heparin level until both correlate due to possible drug-lab interaction between oral anticoagulant (rivaroxaban, edoxaban, or apixaban) and anti-Xa level (aka heparin level).  No heparin issues noted.  Goal of Therapy:  Heparin level 0.3-0.7 units/ml aPTT 66-102 seconds Monitor platelets by anticoagulation protocol: Yes   Plan:  Continue heparin drip at current rate Recheck PTT daily with AM labs  Tyler Deis, Shea Stakes Crowford 09/24/2016,2:52 AM

## 2016-09-24 NOTE — Progress Notes (Signed)
PROGRESS NOTE                                                                                                                                                                                                             Patient Demographics:    Caitlin Taylor, is a 46 y.o. female, DOB - Mar 05, 1971, [id number]  Admit date - 09/22/2016   Admitting Physician [name], MD  Outpatient Primary MD for the patient is [name], MD  LOS - 2   Chief Complaint  Patient presents with  . Abdominal Pain     Brief Narrative   45 y.o. female with medical history significant for hypertension and recent diagnosis of DVT and PE and now managed with Eliquis, she was admitted with abdominal wall cellulitis, [place] with possible abscess versus phlegmon, followed by surgery, patient with creatinine of 2 on admission, unfortunately continued to worsen during hospital stay despite holding nephrotoxic medication.   Subjective:    Caitlin Taylor today has, No headache, No chest pain,No Nausea, No new weakness tingling or numbness, No Cough - SOB.Reports her abdominal pain is controlled.   Assessment  & Plan :    Principal Problem:   Abdominal wall cellulitis Active Problems:   Normocytic anemia   Morbid obesity due to excess calories (HCC)   CKD (chronic kidney disease), stage III   Benign essential HTN   History of pulmonary embolus (PE)   AKI (acute kidney injury) ([place])   Chronic diastolic CHF (congestive heart failure) (HCC)   Cellulitis   Abdominal wall cellulitis with possible abscess  - Pt presents with 1 wk of worsening tenderness in lower abd and a couple days of chills of malaise , mild leukocytosis and AKI on presentation, but no fever or tachycardia; CT findings suggest a developing abscess  - Gen surgery is consulting and much appreciated; plan to hold Eliquis, cont  heparin infusion for possible I&D   - Continue abx, supportive care;  follow clinical response - IV vancomycin on hold in the setting of acute kidney injury, transitioned to doxycycline and Rocephin, as her clinically, and if no improvement would consider Daptomycin.  Acute kidney injury superimposed on CKD stage III  - SCr is 2.06 on admission, baseline 1.3-1.5, today at 2.41 - Continue to Hold lisinopril, Lasix, Aldactone, and HCTZ - Renal consultation appreciated, most  likely in the setting of ATN due to infection/volume depletion/ACE inhibitor , as well vancomycin toxicity  Hx of DVT and PE  - Pt was diagnosed with DVT and b/l PE in February 2018  - She has been tolerating Eliquis well and there is no suggestion of acute VTE  - In anticipation of likely I&D, Eliquis is held on admission and she has been started on heparin infusion   Chronic diastolic CHF   - Pt appears dry on admission in setting of acute infection  - TTE (09/04/16) with EF 09-47%, grade 1 diastolic dysfunction, no WMA, and no significant valvular disease  - She is managed at home with Lopressor, lisinopril, Lasix, and Aldactone  - Lopressor is continued; Lasix, lisinopril, and Aldactone held on admission in light of AKI    Hypertension - Blood pressure is acceptable, continue with metoprolol, continue to hold Lasix lisinopril and Aldactone   finding of thyroid nodule/adrenal adenoma in imaging - Patient reported this has been worked up by her new PCP, ultrasound done recently in outpatient setting   Code Status : Full  Family Communication  : None at bedside  Disposition Plan  : Home when stable  Consults  :  Gen surgery, renal  Procedures  : None  DVT Prophylaxis  :  Heparin GTT  Lab Results  Component Value Date   PLT 155 09/24/2016    Antibiotics  :   Anti-infectives    Start     Dose/Rate Route Frequency Ordered Stop   09/24/16 1000  doxycycline (VIBRAMYCIN) 100 mg in dextrose 5 % 250 mL IVPB     100 mg 125 mL/hr over 120 Minutes Intravenous Every 12 hours  09/24/16 0744     09/24/16 1000  cefTRIAXone (ROCEPHIN) 1 g in dextrose 5 % 50 mL IVPB     1 g 100 mL/hr over 30 Minutes Intravenous Every 24 hours 09/24/16 0926     09/23/16 1000  vancomycin (VANCOCIN) 1,500 mg in sodium chloride 0.9 % 500 mL IVPB  Status:  Discontinued     1,500 mg 250 mL/hr over 120 Minutes Intravenous Every 24 hours 09/22/16 1955 09/24/16 0735   09/22/16 2000  vancomycin (VANCOCIN) IVPB 1000 mg/200 mL premix  Status:  Discontinued     1,000 mg 200 mL/hr over 60 Minutes Intravenous  Once 09/22/16 1950 09/22/16 1953   09/22/16 2000  vancomycin (VANCOCIN) 2,000 mg in sodium chloride 0.9 % 500 mL IVPB     2,000 mg 250 mL/hr over 120 Minutes Intravenous  Once 09/22/16 1955 09/22/16 2222   09/22/16 1245  clindamycin (CLEOCIN) IVPB 600 mg     600 mg 100 mL/hr over 30 Minutes Intravenous  Once 09/22/16 1231 09/22/16 1634        Objective:   Vitals:   09/24/16 0639 09/24/16 0839 09/24/16 1110 09/24/16 1156  BP: 123/67 125/69 110/76 110/76  Pulse: 76 81 73 73  Resp: 18 19  18   Temp: 98.4 F (36.9 C) 99.6 F (37.6 C)  97.6 F (36.4 C)  TempSrc: Oral Axillary  Oral  SpO2: 95% 97%  99%  Weight:      Height:        Wt Readings from Last 3 Encounters:  09/23/16 122 kg (268 lb 15.4 oz)  09/06/16 124.6 kg (274 lb 9.6 oz)  01/21/16 119.7 kg (264 lb)     Intake/Output Summary (Last 24 hours) at 09/24/16 1349 Last data filed at 09/24/16 1121  Gross per 24 hour  Intake           753.22 ml  Output             1400 ml  Net          -646.78 ml     Physical Exam  Awake Alert, Oriented X 3 Supple Neck,No JVD,   Symmetrical Chest wall movement, Good air movement bilaterally, CTAB RRR,No Gallops,Rubs or new Murmurs, No Parasternal Heave +ve B.Sounds, Abd Soft, No rebound - guarding or rigidity, Cellulitis throughout lower right side pannus, some tenderness to palpation No Cyanosis, Clubbing , No new Rash or bruise, +2 right lower extremity edema, +1 left lower  extremity edema    Data Review:    CBC  Recent Labs Lab 09/22/16 1100 09/23/16 0259 09/24/16 0540  WBC 12.6* 12.8* 12.3*  HGB 11.3* 9.9* 9.6*  HCT 33.4* 28.6* 28.3*  PLT 156 155 155  MCV 88.8 88.0 86.3  MCH 30.1 30.5 29.3  MCHC 33.8 34.6 33.9  RDW 13.6 14.0 13.7  LYMPHSABS 0.6*  --   --   MONOABS 1.1*  --   --   EOSABS 0.0  --   --   BASOSABS 0.0  --   --     Chemistries   Recent Labs Lab 09/22/16 1100 09/23/16 0547 09/24/16 0540 09/24/16 1150  NA 137 137  --  136  K 3.6 3.5  --  3.5  CL 101 104  --  105  CO2 27 24  --  23  GLUCOSE 113* 114*  --  143*  BUN 25* 27*  --  25*  CREATININE 2.06* 2.12* 2.41* 2.29*  CALCIUM 9.1 8.5*  --  8.7*  AST  --   --   --  20  ALT  --   --   --  29  ALKPHOS  --   --   --  49  BILITOT  --   --   --  0.6   ------------------------------------------------------------------------------------------------------------------ No results for input(s): CHOL, HDL, LDLCALC, TRIG, CHOLHDL, LDLDIRECT in the last 72 hours.  Lab Results  Component Value Date   HGBA1C 5.0 01/06/2016   ------------------------------------------------------------------------------------------------------------------ No results for input(s): TSH, T4TOTAL, T3FREE, THYROIDAB in the last 72 hours.  Invalid input(s): FREET3 ------------------------------------------------------------------------------------------------------------------ No results for input(s): VITAMINB12, FOLATE, FERRITIN, TIBC, IRON, RETICCTPCT in the last 72 hours.  Coagulation profile No results for input(s): INR, PROTIME in the last 168 hours.  No results for input(s): DDIMER in the last 72 hours.  Cardiac Enzymes No results for input(s): CKMB, TROPONINI, MYOGLOBIN in the last 168 hours.  Invalid input(s): CK ------------------------------------------------------------------------------------------------------------------    Component Value Date/Time   BNP 51.8 09/03/2016 2052     Inpatient Medications  Scheduled Meds: . cefTRIAXone (ROCEPHIN)  IV  1 g Intravenous Q24H  . diphenhydrAMINE  50 mg Oral Once  . doxycycline (VIBRAMYCIN) IV  100 mg Intravenous Q12H  . metoprolol tartrate  12.5 mg Oral BID   Continuous Infusions: . heparin 1,300 Units/hr (09/24/16 1123)   PRN Meds:.acetaminophen **OR** acetaminophen, bisacodyl, diphenhydrAMINE, HYDROcodone-acetaminophen, ondansetron **OR** ondansetron (ZOFRAN) IV, polyethylene glycol  Micro Results No results found for this or any previous visit (from the past 240 hour(s)).  Radiology Reports Dg Chest 2 View  Result Date: 09/03/2016 CLINICAL DATA:  Shortness of breath 1 month with cough and chest pain as well as wheezing. EXAM: CHEST  2 VIEW COMPARISON:  None. FINDINGS: Lungs are adequately inflated without focal consolidation or effusion. Cardiomediastinal silhouette is within  normal. Remaining bones and soft tissues are within normal. IMPRESSION: No active cardiopulmonary disease. Electronically Signed   By: Marin Olp M.D.   On: 09/03/2016 21:33   Ct Angio Chest Pe W And/or Wo Contrast  Result Date: 09/03/2016 CLINICAL DATA:  Acute onset of shortness of breath with exertion. Bilateral lower extremity swelling. Initial encounter. EXAM: CT ANGIOGRAPHY CHEST WITH CONTRAST TECHNIQUE: Multidetector CT imaging of the chest was performed using the standard protocol during bolus administration of intravenous contrast. Multiplanar CT image reconstructions and MIPs were obtained to evaluate the vascular anatomy. CONTRAST:  80 mL of Isovue 370 IV contrast COMPARISON:  Chest radiograph performed earlier today at 9:20 p.m. FINDINGS: Cardiovascular: There is pulmonary embolus within the right main pulmonary artery, extending into all lobes of the right lung. There is also segmental pulmonary embolus within a pulmonary artery to the left lower lobe. The RV/LV ratio is just above 0.9, corresponding to mild right heart strain and  at least submassive pulmonary embolus. The heart is otherwise unremarkable in appearance. The thoracic aorta is unremarkable. No calcific atherosclerotic disease is seen. The vessels are grossly unremarkable. Mediastinum/Nodes: The mediastinum is otherwise unremarkable in appearance. No mediastinal lymphadenopathy is seen. No pericardial effusion is identified. A 2.4 cm exophytic nodule is seen arising at the inferior aspect of the left thyroid lobe. No axillary lymphadenopathy is seen. Lungs/Pleura: Minimal bibasilar atelectasis is noted. No pleural effusion or pneumothorax is seen. No masses are identified. Upper Abdomen: The visualized portions of the liver and spleen are grossly unremarkable. The visualized portions of the gallbladder, pancreas, right and kidneys are grossly unremarkable. A 2.2 cm left adrenal adenoma is noted. Nonspecific left-sided perinephric stranding is seen. Musculoskeletal: No acute osseous abnormalities are identified. The visualized musculature is unremarkable in appearance. Review of the MIP images confirms the above findings. IMPRESSION: 1. Pulmonary embolus within the right main pulmonary artery, extending into all lobes of the right lung. Segmental pulmonary embolus within the pulmonary artery to the left lower lobe. CT evidence of right heart strain (RV/LV Ratio = 0.91) consistent with at least submassive (intermediate risk) PE. The presence of right heart strain has been associated with an increased risk of morbidity and mortality. Please activate Code PE by paging [phone]. 2. Minimal bibasilar atelectasis noted.  Lungs otherwise clear. 3. **An incidental finding of potential clinical significance has been found. 2.4 cm exophytic nodule arising at the inferior aspect of the left thyroid lobe. Recommend further evaluation with thyroid ultrasound. If patient is clinically hyperthyroid, consider nuclear medicine thyroid uptake and scan.** 4. 2.2 cm left adrenal adenoma noted.  Critical Value/emergent results were called by telephone at the time of interpretation on 09/03/2016 at 11:22 pm to Dr. [name], who verbally acknowledged these results. Electronically Signed   By: Garald Balding M.D.   On: 09/03/2016 23:30   Ct Pelvis Wo Contrast  Result Date: 09/22/2016 CLINICAL DATA:  Red, warm, extremely painful region lower anterior abdominal wall EXAM: CT PELVIS WITHOUT CONTRAST TECHNIQUE: Multidetector CT imaging of the pelvis was performed following the standard protocol without intravenous contrast. COMPARISON:  None. FINDINGS: Urinary Tract:  Unremarkable. Bowel:  Unremarkable. Vascular/Lymphatic: Unremarkable. Reproductive:  Unremarkable. Other:  No free fluid. Musculoskeletal: 4.1 x 5.0 cm irregular soft tissue attenuating lesion is identified in the lower right pannus with surrounding edema/ inflammation and overlying skin thickening. Lesion not fully evaluated given the lack of intravenous contrast material. Bone windows reveal no worrisome lytic or sclerotic osseous lesions. Patient is status post  lower lumbar fusion. IMPRESSION: 1. 4.1 x 5.0 cm irregular soft tissue attenuating lesion in the subcutaneous fat of the lower right pannus. Given clinical history and the overlying skin thickening and adjacent edema/inflammation, this is probably phlegmon/evolving abscess. Subcutaneous hematoma or subcutaneous metastatic lesion cannot be excluded by imaging alone. Electronically Signed   By: Misty Stanley M.D.   On: 09/22/2016 12:23   US Thyroid  Result Date: 09/21/2016 CLINICAL DATA:  Incidental on CT.  Left thyroid nodule noted on CT EXAM: THYROID ULTRASOUND TECHNIQUE: Ultrasound examination of the thyroid gland and adjacent soft tissues was performed. COMPARISON:  None. FINDINGS: Parenchymal Echotexture: Normal Isthmus: 0.4 cm Right lobe: 5.3 x 1.7 x 2.1 cm Left lobe: 5.2 x 1.4 x 2.0 cm _________________________________________________________ Estimated total number of  nodules >/= 1 cm: 0 Number of spongiform nodules >/=  2 cm not described below (TR1): 0 Number of mixed cystic and solid nodules >/= 1.5 cm not described below (TR2): 0 _________________________________________________________ Small nodules and cysts measuring 0.5 cm or less are scattered throughout the left lobe and have a benign appearance. IMPRESSION: Tiny nodules and cysts in left lobe were present. None meet criteria for fine needle aspiration biopsy or annual follow-up. The above is in keeping with the ACR TI-RADS recommendations - J Am Coll Radiol 2017;14:587-595. Electronically Signed   By: Marybelle Killings M.D.   On: 09/21/2016 15:28    Tiquan Bouch M.D on 09/24/2016 at 1:49 PM  Between 7am to 7pm - Pager - [phone]  After 7pm go to [email or web address] - password [facility]  [facility] -  Office  [phone]

## 2016-09-25 LAB — RENAL FUNCTION PANEL
ALBUMIN: 3.6 g/dL (ref 3.5–5.0)
Anion gap: 11 (ref 5–15)
BUN: 19 mg/dL (ref 6–20)
CO2: 24 mmol/L (ref 22–32)
CREATININE: 1.98 mg/dL — AB (ref 0.44–1.00)
Calcium: 9.2 mg/dL (ref 8.9–10.3)
Chloride: 103 mmol/L (ref 101–111)
GFR calc Af Amer: 34 mL/min — ABNORMAL LOW (ref >60)
GFR, EST NON AFRICAN AMERICAN: 29 mL/min — AB (ref >60)
Glucose, Bld: 109 mg/dL — ABNORMAL HIGH (ref 65–99)
PHOSPHORUS: 2.8 mg/dL (ref 2.5–4.6)
POTASSIUM: 3.7 mmol/L (ref 3.5–5.1)
Sodium: 138 mmol/L (ref 135–145)

## 2016-09-25 LAB — CREATININE, SERUM
CREATININE: 2.07 mg/dL — AB (ref 0.44–1.00)
GFR calc non Af Amer: 28 mL/min — ABNORMAL LOW (ref >60)
GFR, EST AFRICAN AMERICAN: 32 mL/min — AB (ref >60)

## 2016-09-25 LAB — CBC
HEMATOCRIT: 32.5 % — AB (ref 36.0–46.0)
HEMOGLOBIN: 10.9 g/dL — AB (ref 12.0–15.0)
MCH: 29.4 pg (ref 26.0–34.0)
MCHC: 33.5 g/dL (ref 30.0–36.0)
MCV: 87.6 fL (ref 78.0–100.0)
Platelets: 198 10*3/uL (ref 150–400)
RBC: 3.71 MIL/uL — AB (ref 3.87–5.11)
RDW: 13.8 % (ref 11.5–15.5)
WBC: 13.7 10*3/uL — ABNORMAL HIGH (ref 4.0–10.5)

## 2016-09-25 LAB — APTT
aPTT: 67 seconds — ABNORMAL HIGH (ref 24–36)
aPTT: 77 seconds — ABNORMAL HIGH (ref 24–36)

## 2016-09-25 LAB — HEPARIN LEVEL (UNFRACTIONATED)
HEPARIN UNFRACTIONATED: 1.16 [IU]/mL — AB (ref 0.30–0.70)
Heparin Unfractionated: 1.03 IU/mL — ABNORMAL HIGH (ref 0.30–0.70)

## 2016-09-25 LAB — URINE CULTURE: CULTURE: NO GROWTH

## 2016-09-25 LAB — GLUCOSE, CAPILLARY: GLUCOSE-CAPILLARY: 100 mg/dL — AB (ref 65–99)

## 2016-09-25 MED ORDER — BISACODYL 5 MG PO TBEC: 5.0000 mg | DELAYED_RELEASE_TABLET | Freq: Once | ORAL | Status: DC

## 2016-09-25 MED ORDER — POLYETHYLENE GLYCOL 3350 17 G PO PACK: 34.0000 g | PACK | Freq: Once | ORAL | Status: DC

## 2016-09-25 NOTE — Progress Notes (Signed)
[place] for IV heparin Indication: recent PE/DVT, on apixaban prior to admission.  No Known Allergies  Patient Measurements: Height: 5\' 3"  (160 cm) Weight: 273 lb 12.8 oz (124.2 kg) IBW/kg (Calculated) : 52.4 Heparin Dosing Weight: 82.7 kg  Vital Signs: Temp: 98 F (36.7 C) (03/17 0527) Temp Source: Oral (03/17 0527) BP: 109/65 (03/17 0905) Pulse Rate: 72 (03/17 0905)  Labs:  Recent Labs  09/23/16 0259  09/24/16 0100 09/24/16 0115 09/24/16 0540 09/24/16 1150 09/25/16 0619 09/25/16 1144  HGB 9.9*  --   --   --  9.6*  --  10.9*  --   HCT 28.6*  --   --   --  28.3*  --  32.5*  --   PLT 155  --   --   --  155  --  198  --   APTT 108*  < >  --  88*  --   --  67* 77*  HEPARINUNFRC >2.20*  --  >2.20*  --   --   --  1.16* 1.03*  CREATININE  --   < >  --   --  2.41* 2.29* 1.98*  2.07*  --   CKTOTAL  --   --   --   --   --  29*  --   --   < > = values in this interval not displayed.  Estimated Creatinine Clearance: 43.9 mL/min (A) (by C-G formula based on SCr of 2.07 mg/dL (H)).   Medical History: Past Medical History:  Diagnosis Date  . Chronic back pain   . CKD (chronic kidney disease) stage 2, GFR 60-89 ml/min   . DVT (deep venous thrombosis) ([place])   . Hypertension   . Lumbar stenosis   . Pulmonary embolism ([place])    a. diagnosed in 08/2016. Started on Eliquis    Assessment: 50 yoF with PMH HTN, recent bilateral PE with DVT diagnosed 09/03/16 and currently on Eliquis, presents to [facility] with abdominal wall cellulitis with possible abscess. Transferred to [facility] with Surgery to evaluate for I&D. Converting Eliquis to heparin per pharmacy in anticipation of possible surgery.   Baseline heparin level> 2.2  Prior anticoagulation: Eliquis 5 mg bid, last dose 3/13 at 2000  AKI noted on admission with SCr 2.06 (baseline ~1.3)  Significant events:  Today, 09/25/2016:  Patient with high heparin level but PTT at goal at current rate  of 1300 units/hr  CBC: hgb low, Plt wnl  No bleeding or infusion issues per nursing  CrCl: 43 ml/min  Goal of Therapy: aPTT 66-102 seconds Heparin level 0.3-0.7 units/ml Monitor platelets by anticoagulation protocol: Yes  Plan:  Continue heparin infusion at 1300 units/hr  Daily CBC, daily heparin level and PTT  Monitor for signs of bleeding or thrombosis  Regarding timing of procedures, for patients with CrCl 30-50 ml/min, ACCP recommends holding Eliquis 3 days for low-risk procedures, and 4 days for high-risk procedures.    Dolly Rias RPh 09/25/2016, 12:55 PM Pager [phone]

## 2016-09-25 NOTE — Progress Notes (Signed)
PROGRESS NOTE                                                                                                                                                                                                             Patient Demographics:    Caitlin Taylor, is a 46 y.o. female, DOB - [birth year], WUJ:[id number]  Admit date - 09/22/2016   Admitting Physician [name], MD  Outpatient Primary MD for the patient is [name], MD  LOS - 3   Chief Complaint  Patient presents with  . Abdominal Pain     Brief Narrative   46 y.o. female with medical history significant for hypertension and recent diagnosis of DVT and PE and now managed with Eliquis, she was admitted with abdominal wall cellulitis, imaging with possible abscess versus phlegmon, followed by surgery, patient with creatinine of 2 on admission.   Subjective:    Caitlin Taylor today has, No headache, No chest pain,No Nausea, No new weakness tingling or numbness, No Cough - SOB.Reports her abdominal pain is controlled.   Assessment  & Plan :    Principal Problem:   Abdominal wall cellulitis Active Problems:   Normocytic anemia   Morbid obesity due to excess calories (HCC)   CKD (chronic kidney disease), stage III   Benign essential HTN   History of pulmonary embolus (PE)   AKI (acute kidney injury) ([place])   Chronic diastolic CHF (congestive heart failure) (HCC)   Cellulitis   Abdominal wall cellulitis with possible abscess  - Pt presents with 1 wk of worsening tenderness in lower abd and a couple days of chills of malaise , mild leukocytosis and AKI on presentation, but no fever or tachycardia; CT findings suggest a developing abscess  - Gen surgery is consulting and much appreciated; holding Eliquis, on  heparin infusion for possible I&D   - Continue abx, supportive care; follow clinical response - IV vancomycin on hold in the setting of acute kidney injury, transitioned to  doxycycline and Rocephin, as her clinically, and if no improvement would consider Daptomycin.  Acute kidney injury superimposed on CKD stage III  - SCr is 2.06 on admission, baseline 1.3-1.5, peaked at  2.41 - Continue to Hold lisinopril, Lasix, Aldactone, and HCTZ - Renal consultation appreciated, most likely in the setting of ATN due to infection/volume depletion/ACE inhibitor .  Hx of DVT and PE  - Pt was diagnosed with DVT and b/l PE in February 2018  - She has been tolerating Eliquis well and there is no suggestion of acute VTE  - In anticipation of likely I&D, Eliquis is held on admission and she has been started on heparin infusion   Chronic diastolic CHF   - Pt appears dry on admission in setting of acute infection  - TTE (09/04/16) with EF 95-09%, grade 1 diastolic dysfunction, no WMA, and no significant valvular disease  - She is managed at home with Lopressor, lisinopril, Lasix, and Aldactone  - Lopressor is continued; Lasix, lisinopril, and Aldactone held on admission in light of AKI    Hypertension - Blood pressure is acceptable, continue with metoprolol, continue to hold Lasix lisinopril and Aldactone   finding of thyroid nodule/adrenal adenoma in imaging - Patient reported this has been worked up by her new PCP, ultrasound done recently in outpatient setting   Code Status : Full  Family Communication  : None at bedside  Disposition Plan  : Home when stable  Consults  :  Gen surgery, renal  Procedures  : None  DVT Prophylaxis  :  Heparin GTT  Lab Results  Component Value Date   PLT 198 09/25/2016    Antibiotics  :   Anti-infectives    Start     Dose/Rate Route Frequency Ordered Stop   09/24/16 1000  doxycycline (VIBRAMYCIN) 100 mg in dextrose 5 % 250 mL IVPB     100 mg 125 mL/hr over 120 Minutes Intravenous Every 12 hours 09/24/16 0744     09/24/16 1000  cefTRIAXone (ROCEPHIN) 1 g in dextrose 5 % 50 mL IVPB     1 g 100 mL/hr over 30 Minutes  Intravenous Every 24 hours 09/24/16 0926     09/23/16 1000  vancomycin (VANCOCIN) 1,500 mg in sodium chloride 0.9 % 500 mL IVPB  Status:  Discontinued     1,500 mg 250 mL/hr over 120 Minutes Intravenous Every 24 hours 09/22/16 1955 09/24/16 0735   09/22/16 2000  vancomycin (VANCOCIN) IVPB 1000 mg/200 mL premix  Status:  Discontinued     1,000 mg 200 mL/hr over 60 Minutes Intravenous  Once 09/22/16 1950 09/22/16 1953   09/22/16 2000  vancomycin (VANCOCIN) 2,000 mg in sodium chloride 0.9 % 500 mL IVPB     2,000 mg 250 mL/hr over 120 Minutes Intravenous  Once 09/22/16 1955 09/22/16 2222   09/22/16 1245  clindamycin (CLEOCIN) IVPB 600 mg     600 mg 100 mL/hr over 30 Minutes Intravenous  Once 09/22/16 1231 09/22/16 1634        Objective:   Vitals:   09/24/16 1704 09/24/16 2249 09/25/16 0527 09/25/16 0905  BP: (!) 111/59 124/67 (!) 113/54 109/65  Pulse: 64 72 71 72  Resp: 18 19 18    Temp: 97.7 F (36.5 C) 98 F (36.7 C) 98 F (36.7 C)   TempSrc: Oral Oral Oral   SpO2: 97% 97% 97%   Weight:   124.2 kg (273 lb 12.8 oz)   Height:        Wt Readings from Last 3 Encounters:  09/25/16 124.2 kg (273 lb 12.8 oz)  09/06/16 124.6 kg (274 lb 9.6 oz)  01/21/16 119.7 kg (264 lb)     Intake/Output Summary (Last 24 hours) at 09/25/16 1128 Last data filed at 09/25/16 0540  Gross per 24 hour  Intake  1062.1 ml  Output             1750 ml  Net           -687.9 ml     Physical Exam  Awake Alert, Oriented X 3 Supple Neck,No JVD,   Symmetrical Chest wall movement, Good air movement bilaterally, CTAB RRR,No Gallops,Rubs or new Murmurs, No Parasternal Heave +ve B.Sounds, Abd Soft, No rebound - guarding or rigidity, Cellulitis throughout lower right side pannus, some tenderness to palpation, no change. No Cyanosis, Clubbing , No new Rash or bruise, +2 right lower extremity edema, +1 left lower extremity edema    Data Review:    CBC  Recent Labs Lab 09/22/16 1100  09/23/16 0259 09/24/16 0540 09/25/16 0619  WBC 12.6* 12.8* 12.3* 13.7*  HGB 11.3* 9.9* 9.6* 10.9*  HCT 33.4* 28.6* 28.3* 32.5*  PLT 156 155 155 198  MCV 88.8 88.0 86.3 87.6  MCH 30.1 30.5 29.3 29.4  MCHC 33.8 34.6 33.9 33.5  RDW 13.6 14.0 13.7 13.8  LYMPHSABS 0.6*  --   --   --   MONOABS 1.1*  --   --   --   EOSABS 0.0  --   --   --   BASOSABS 0.0  --   --   --     Chemistries   Recent Labs Lab 09/22/16 1100 09/23/16 0547 09/24/16 0540 09/24/16 1150 09/25/16 0619  NA 137 137  --  136 138  K 3.6 3.5  --  3.5 3.7  CL 101 104  --  105 103  CO2 27 24  --  23 24  GLUCOSE 113* 114*  --  143* 109*  BUN 25* 27*  --  25* 19  CREATININE 2.06* 2.12* 2.41* 2.29* 1.98*  2.07*  CALCIUM 9.1 8.5*  --  8.7* 9.2  AST  --   --   --  20  --   ALT  --   --   --  29  --   ALKPHOS  --   --   --  49  --   BILITOT  --   --   --  0.6  --    ------------------------------------------------------------------------------------------------------------------ No results for input(s): CHOL, HDL, LDLCALC, TRIG, CHOLHDL, LDLDIRECT in the last 72 hours.  Lab Results  Component Value Date   HGBA1C 5.0 01/06/2016   ------------------------------------------------------------------------------------------------------------------ No results for input(s): TSH, T4TOTAL, T3FREE, THYROIDAB in the last 72 hours.  Invalid input(s): FREET3 ------------------------------------------------------------------------------------------------------------------ No results for input(s): VITAMINB12, FOLATE, FERRITIN, TIBC, IRON, RETICCTPCT in the last 72 hours.  Coagulation profile No results for input(s): INR, PROTIME in the last 168 hours.  No results for input(s): DDIMER in the last 72 hours.  Cardiac Enzymes No results for input(s): CKMB, TROPONINI, MYOGLOBIN in the last 168 hours.  Invalid input(s):  CK ------------------------------------------------------------------------------------------------------------------    Component Value Date/Time   BNP 51.8 09/03/2016 2052    Inpatient Medications  Scheduled Meds: . cefTRIAXone (ROCEPHIN)  IV  1 g Intravenous Q24H  . diphenhydrAMINE  50 mg Oral Once  . doxycycline (VIBRAMYCIN) IV  100 mg Intravenous Q12H  . metoprolol tartrate  12.5 mg Oral BID   Continuous Infusions: . heparin 1,300 Units/hr (09/25/16 0726)   PRN Meds:.acetaminophen **OR** acetaminophen, bisacodyl, diphenhydrAMINE, HYDROcodone-acetaminophen, ondansetron **OR** ondansetron (ZOFRAN) IV, polyethylene glycol  Micro Results No results found for this or any previous visit (from the past 240 hour(s)).  Radiology Reports Dg Chest 2 View  Result Date: 09/03/2016 CLINICAL  DATA:  Shortness of breath 1 month with cough and chest pain as well as wheezing. EXAM: CHEST  2 VIEW COMPARISON:  None. FINDINGS: Lungs are adequately inflated without focal consolidation or effusion. Cardiomediastinal silhouette is within normal. Remaining bones and soft tissues are within normal. IMPRESSION: No active cardiopulmonary disease. Electronically Signed   By: Marin Olp M.D.   On: 09/03/2016 21:33   Ct Angio Chest Pe W And/or Wo Contrast  Result Date: 09/03/2016 CLINICAL DATA:  Acute onset of shortness of breath with exertion. Bilateral lower extremity swelling. Initial encounter. EXAM: CT ANGIOGRAPHY CHEST WITH CONTRAST TECHNIQUE: Multidetector CT imaging of the chest was performed using the standard protocol during bolus administration of intravenous contrast. Multiplanar CT image reconstructions and MIPs were obtained to evaluate the vascular anatomy. CONTRAST:  80 mL of Isovue 370 IV contrast COMPARISON:  Chest radiograph performed earlier today at 9:20 p.m. FINDINGS: Cardiovascular: There is pulmonary embolus within the right main pulmonary artery, extending into all lobes of the right  lung. There is also segmental pulmonary embolus within a pulmonary artery to the left lower lobe. The RV/LV ratio is just above 0.9, corresponding to mild right heart strain and at least submassive pulmonary embolus. The heart is otherwise unremarkable in appearance. The thoracic aorta is unremarkable. No calcific atherosclerotic disease is seen. The vessels are grossly unremarkable. Mediastinum/Nodes: The mediastinum is otherwise unremarkable in appearance. No mediastinal lymphadenopathy is seen. No pericardial effusion is identified. A 2.4 cm exophytic nodule is seen arising at the inferior aspect of the left thyroid lobe. No axillary lymphadenopathy is seen. Lungs/Pleura: Minimal bibasilar atelectasis is noted. No pleural effusion or pneumothorax is seen. No masses are identified. Upper Abdomen: The visualized portions of the liver and spleen are grossly unremarkable. The visualized portions of the gallbladder, pancreas, right and kidneys are grossly unremarkable. A 2.2 cm left adrenal adenoma is noted. Nonspecific left-sided perinephric stranding is seen. Musculoskeletal: No acute osseous abnormalities are identified. The visualized musculature is unremarkable in appearance. Review of the MIP images confirms the above findings. IMPRESSION: 1. Pulmonary embolus within the right main pulmonary artery, extending into all lobes of the right lung. Segmental pulmonary embolus within the pulmonary artery to the left lower lobe. CT evidence of right heart strain (RV/LV Ratio = 0.91) consistent with at least submassive (intermediate risk) PE. The presence of right heart strain has been associated with an increased risk of morbidity and mortality. Please activate Code PE by paging [phone]. 2. Minimal bibasilar atelectasis noted.  Lungs otherwise clear. 3. **An incidental finding of potential clinical significance has been found. 2.4 cm exophytic nodule arising at the inferior aspect of the left thyroid lobe.  Recommend further evaluation with thyroid ultrasound. If patient is clinically hyperthyroid, consider nuclear medicine thyroid uptake and scan.** 4. 2.2 cm left adrenal adenoma noted. Critical Value/emergent results were called by telephone at the time of interpretation on 09/03/2016 at 11:22 pm to Dr. [name], who verbally acknowledged these results. Electronically Signed   By: Garald Balding M.D.   On: 09/03/2016 23:30   Ct Pelvis Wo Contrast  Result Date: 09/22/2016 CLINICAL DATA:  Red, warm, extremely painful region lower anterior abdominal wall EXAM: CT PELVIS WITHOUT CONTRAST TECHNIQUE: Multidetector CT imaging of the pelvis was performed following the standard protocol without intravenous contrast. COMPARISON:  None. FINDINGS: Urinary Tract:  Unremarkable. Bowel:  Unremarkable. Vascular/Lymphatic: Unremarkable. Reproductive:  Unremarkable. Other:  No free fluid. Musculoskeletal: 4.1 x 5.0 cm irregular soft tissue attenuating lesion is identified  in the lower right pannus with surrounding edema/ inflammation and overlying skin thickening. Lesion not fully evaluated given the lack of intravenous contrast material. Bone windows reveal no worrisome lytic or sclerotic osseous lesions. Patient is status post lower lumbar fusion. IMPRESSION: 1. 4.1 x 5.0 cm irregular soft tissue attenuating lesion in the subcutaneous fat of the lower right pannus. Given clinical history and the overlying skin thickening and adjacent edema/inflammation, this is probably phlegmon/evolving abscess. Subcutaneous hematoma or subcutaneous metastatic lesion cannot be excluded by imaging alone. Electronically Signed   By: Misty Stanley M.D.   On: 09/22/2016 12:23   US Renal  Result Date: 09/24/2016 CLINICAL DATA:  Acute renal failure EXAM: RENAL / URINARY TRACT ULTRASOUND COMPLETE COMPARISON:  Ultrasound 08/04/2016 FINDINGS: Right Kidney: Length: 11.6 cm. Echogenicity within normal limits. No mass or hydronephrosis visualized.  Left Kidney: Length: 12.0 cm. Echogenicity within normal limits. No mass or hydronephrosis visualized. Bladder: Appears normal for degree of bladder distention. IMPRESSION: Normal renal ultrasound. Electronically Signed   By: Suzy Bouchard M.D.   On: 09/24/2016 20:34   US Thyroid  Result Date: 09/21/2016 CLINICAL DATA:  Incidental on CT.  Left thyroid nodule noted on CT EXAM: THYROID ULTRASOUND TECHNIQUE: Ultrasound examination of the thyroid gland and adjacent soft tissues was performed. COMPARISON:  None. FINDINGS: Parenchymal Echotexture: Normal Isthmus: 0.4 cm Right lobe: 5.3 x 1.7 x 2.1 cm Left lobe: 5.2 x 1.4 x 2.0 cm _________________________________________________________ Estimated total number of nodules >/= 1 cm: 0 Number of spongiform nodules >/=  2 cm not described below (TR1): 0 Number of mixed cystic and solid nodules >/= 1.5 cm not described below (TR2): 0 _________________________________________________________ Small nodules and cysts measuring 0.5 cm or less are scattered throughout the left lobe and have a benign appearance. IMPRESSION: Tiny nodules and cysts in left lobe were present. None meet criteria for fine needle aspiration biopsy or annual follow-up. The above is in keeping with the ACR TI-RADS recommendations - J Am Coll Radiol 2017;14:587-595. Electronically Signed   By: Marybelle Killings M.D.   On: 09/21/2016 15:28    Etty Isaac M.D on 09/25/2016 at 11:28 AM  Between 7am to 7pm - Pager - [phone]  After 7pm go to [email or web address] - password [facility]  [facility] -  Office  [phone]

## 2016-09-25 NOTE — Progress Notes (Addendum)
[place] for IV heparin Indication: recent PE/DVT, on apixaban prior to admission.  No Known Allergies  Patient Measurements: Height: 5\' 3"  (160 cm) Weight: 273 lb 12.8 oz (124.2 kg) IBW/kg (Calculated) : 52.4 Heparin Dosing Weight: 82.7 kg  Vital Signs: Temp: 98 F (36.7 C) (03/17 0527) Temp Source: Oral (03/17 0527) BP: 113/54 (03/17 0527) Pulse Rate: 71 (03/17 0527)  Labs:  Recent Labs  09/23/16 0259  09/23/16 1919 09/24/16 0100 09/24/16 0115 09/24/16 0540 09/24/16 1150 09/25/16 0619  HGB 9.9*  --   --   --   --  9.6*  --  10.9*  HCT 28.6*  --   --   --   --  28.3*  --  32.5*  PLT 155  --   --   --   --  155  --  198  APTT 108*  < > 77*  --  88*  --   --  67*  HEPARINUNFRC >2.20*  --   --  >2.20*  --   --   --  1.16*  CREATININE  --   < >  --   --   --  2.41* 2.29* 1.98*  2.07*  CKTOTAL  --   --   --   --   --   --  29*  --   < > = values in this interval not displayed.  Estimated Creatinine Clearance: 43.9 mL/min (A) (by C-G formula based on SCr of 2.07 mg/dL (H)).   Medical History: Past Medical History:  Diagnosis Date  . Chronic back pain   . CKD (chronic kidney disease) stage 2, GFR 60-89 ml/min   . DVT (deep venous thrombosis) ([place])   . Hypertension   . Lumbar stenosis   . Pulmonary embolism ([place])    a. diagnosed in 08/2016. Started on Eliquis    Assessment: 29 yoF with PMH HTN, recent bilateral PE with DVT diagnosed 09/03/16 and currently on Eliquis, presents to [facility] with abdominal wall cellulitis with possible abscess. Transferred to [facility] with Surgery to evaluate for I&D. Converting Eliquis to heparin per pharmacy in anticipation of possible surgery.   Baseline heparin level> 2.2  Prior anticoagulation: Eliquis 5 mg bid, last dose 3/13 at 2000  AKI noted on admission with SCr 2.06 (baseline ~1.3)  Significant events:  Today, 09/25/2016:  Patient with high heparin level but PTT at goal at current rate of  1300 units/hr  CBC: hgb low, Plt wnl  No bleeding or infusion issues per nursing  CrCl: 43 ml/min  Goal of Therapy: aPTT 66-102 seconds Heparin level 0.3-0.7 units/ml Monitor platelets by anticoagulation protocol: Yes  Plan:  Continue heparin infusion at 1300 units/hr  Check PTT and HL in 6 hours  Daily CBC, daily heparin level   Monitor for signs of bleeding or thrombosis  Regarding timing of procedures, for patients with CrCl 30-50 ml/min, ACCP recommends holding Eliquis 3 days for low-risk procedures, and 4 days for high-risk procedures.    Dolly Rias RPh 09/25/2016, 9:03 AM Pager [phone]

## 2016-09-25 NOTE — Progress Notes (Signed)
  Subjective: Still with moderate abdominal wall pain, although less  Objective: Vital signs in last 24 hours: Temp:  [97.6 F (36.4 C)-98 F (36.7 C)] 98 F (36.7 C) (03/17 0527) Pulse Rate:  [64-73] 72 (03/17 0905) Resp:  [18-19] 18 (03/17 0527) BP: (109-124)/(54-76) 109/65 (03/17 0905) SpO2:  [97 %-99 %] 97 % (03/17 0527) Weight:  [124.2 kg (273 lb 12.8 oz)] 124.2 kg (273 lb 12.8 oz) (03/17 0527) Last BM Date: 09/21/16  Intake/Output from previous day: 03/16 0701 - 03/17 0700 In: 2002.1 [P.O.:720; I.V.:282.1; IV Piggyback:1000] Out: 2650 [Urine:2650] Intake/Output this shift: No intake/output data recorded.  Exam: Abdomen obese with RLQ pannus erythema and mild induration but no fluctuance Lab Results:   Recent Labs  09/24/16 0540 09/25/16 0619  WBC 12.3* 13.7*  HGB 9.6* 10.9*  HCT 28.3* 32.5*  PLT 155 198   BMET  Recent Labs  09/24/16 1150 09/25/16 0619  NA 136 138  K 3.5 3.7  CL 105 103  CO2 23 24  GLUCOSE 143* 109*  BUN 25* 19  CREATININE 2.29* 1.98*  2.07*  CALCIUM 8.7* 9.2   PT/INR No results for input(s): LABPROT, INR in the last 72 hours. ABG No results for input(s): PHART, HCO3 in the last 72 hours.  Invalid input(s): PCO2, PO2  Studies/Results: US Renal  Result Date: 09/24/2016 CLINICAL DATA:  Acute renal failure EXAM: RENAL / URINARY TRACT ULTRASOUND COMPLETE COMPARISON:  Ultrasound 08/04/2016 FINDINGS: Right Kidney: Length: 11.6 cm. Echogenicity within normal limits. No mass or hydronephrosis visualized. Left Kidney: Length: 12.0 cm. Echogenicity within normal limits. No mass or hydronephrosis visualized. Bladder: Appears normal for degree of bladder distention. IMPRESSION: Normal renal ultrasound. Electronically Signed   By: Suzy Bouchard M.D.   On: 09/24/2016 20:34    Anti-infectives: Anti-infectives    Start     Dose/Rate Route Frequency Ordered Stop   09/24/16 1000  doxycycline (VIBRAMYCIN) 100 mg in dextrose 5 % 250 mL IVPB      100 mg 125 mL/hr over 120 Minutes Intravenous Every 12 hours 09/24/16 0744     09/24/16 1000  cefTRIAXone (ROCEPHIN) 1 g in dextrose 5 % 50 mL IVPB     1 g 100 mL/hr over 30 Minutes Intravenous Every 24 hours 09/24/16 0926     09/23/16 1000  vancomycin (VANCOCIN) 1,500 mg in sodium chloride 0.9 % 500 mL IVPB  Status:  Discontinued     1,500 mg 250 mL/hr over 120 Minutes Intravenous Every 24 hours 09/22/16 1955 09/24/16 0735   09/22/16 2000  vancomycin (VANCOCIN) IVPB 1000 mg/200 mL premix  Status:  Discontinued     1,000 mg 200 mL/hr over 60 Minutes Intravenous  Once 09/22/16 1950 09/22/16 1953   09/22/16 2000  vancomycin (VANCOCIN) 2,000 mg in sodium chloride 0.9 % 500 mL IVPB     2,000 mg 250 mL/hr over 120 Minutes Intravenous  Once 09/22/16 1955 09/22/16 2222   09/22/16 1245  clindamycin (CLEOCIN) IVPB 600 mg     600 mg 100 mL/hr over 30 Minutes Intravenous  Once 09/22/16 1231 09/22/16 1634      Assessment/Plan:  Panniculitis  Clinically improving but WBC increased. Will continue IV antibiotics.  If WBC continues to go up, we would either repeat the CT or just I and D in the OR.   LOS: 3 days    Caitlin Taylor A 09/25/2016

## 2016-09-25 NOTE — Progress Notes (Signed)
Patient ID: Caitlin Taylor, female   DOB: 03-09-1971, 46 y.o.   MRN: [id number] S: Feeling better O:BP 109/65   Pulse 72   Temp 98 F (36.7 C) (Oral)   Resp 18   Ht 5\' 3"  (1.6 m)   Wt 124.2 kg (273 lb 12.8 oz)   LMP 09/05/2016 Comment: Negative u-preg  SpO2 97%   BMI 48.50 kg/m   Intake/Output Summary (Last 24 hours) at 09/25/16 1143 Last data filed at 09/25/16 0540  Gross per 24 hour  Intake           1062.1 ml  Output             1750 ml  Net           -687.9 ml   Intake/Output: I/O last 3 completed shifts: In: 2210.1 [P.O.:720; I.V.:490.1; IV Piggyback:1000] Out: 3150 [Urine:3150]  Intake/Output this shift:  No intake/output data recorded. Weight change:  Gen:NAD CVS: no rub Resp:cta Abd: +BS, area of induration in RLQ/suprapubic region has consolidated but still with tender and firm core Ext: tr edema   Recent Labs Lab 09/22/16 1100 09/23/16 0547 09/24/16 0540 09/24/16 1150 09/25/16 0619  NA 137 137  --  136 138  K 3.6 3.5  --  3.5 3.7  CL 101 104  --  105 103  CO2 27 24  --  23 24  GLUCOSE 113* 114*  --  143* 109*  BUN 25* 27*  --  25* 19  CREATININE 2.06* 2.12* 2.41* 2.29* 1.98*  2.07*  ALBUMIN  --   --   --  3.3* 3.6  CALCIUM 9.1 8.5*  --  8.7* 9.2  PHOS  --   --   --   --  2.8  AST  --   --   --  20  --   ALT  --   --   --  29  --    Liver Function Tests:  Recent Labs Lab 09/24/16 1150 09/25/16 0619  AST 20  --   ALT 29  --   ALKPHOS 49  --   BILITOT 0.6  --   PROT 6.6  --   ALBUMIN 3.3* 3.6   No results for input(s): LIPASE, AMYLASE in the last 168 hours. No results for input(s): AMMONIA in the last 168 hours. CBC:  Recent Labs Lab 09/22/16 1100 09/23/16 0259 09/24/16 0540 09/25/16 0619  WBC 12.6* 12.8* 12.3* 13.7*  NEUTROABS 10.9*  --   --   --   HGB 11.3* 9.9* 9.6* 10.9*  HCT 33.4* 28.6* 28.3* 32.5*  MCV 88.8 88.0 86.3 87.6  PLT 156 155 155 198   Cardiac Enzymes:  Recent Labs Lab 09/24/16 1150  CKTOTAL 29*    CBG:  Recent Labs Lab 09/23/16 0742 09/24/16 0728 09/25/16 0746  GLUCAP 125* 113* 100*    Iron Studies: No results for input(s): IRON, TIBC, TRANSFERRIN, FERRITIN in the last 72 hours. Studies/Results: US Renal  Result Date: 09/24/2016 CLINICAL DATA:  Acute renal failure EXAM: RENAL / URINARY TRACT ULTRASOUND COMPLETE COMPARISON:  Ultrasound 08/04/2016 FINDINGS: Right Kidney: Length: 11.6 cm. Echogenicity within normal limits. No mass or hydronephrosis visualized. Left Kidney: Length: 12.0 cm. Echogenicity within normal limits. No mass or hydronephrosis visualized. Bladder: Appears normal for degree of bladder distention. IMPRESSION: Normal renal ultrasound. Electronically Signed   By: Suzy Bouchard M.D.   On: 09/24/2016 20:34   . bisacodyl  5 mg Oral Once  . cefTRIAXone (ROCEPHIN)  IV  1 g Intravenous Q24H  . diphenhydrAMINE  50 mg Oral Once  . doxycycline (VIBRAMYCIN) IV  100 mg Intravenous Q12H  . metoprolol tartrate  12.5 mg Oral BID  . polyethylene glycol  34 g Oral Once    BMET    Component Value Date/Time   NA 138 09/25/2016 0619   K 3.7 09/25/2016 0619   CL 103 09/25/2016 0619   CO2 24 09/25/2016 0619   GLUCOSE 109 (H) 09/25/2016 0619   BUN 19 09/25/2016 0619   CREATININE 2.07 (H) 09/25/2016 0619   CREATININE 1.98 (H) 09/25/2016 0619   CALCIUM 9.2 09/25/2016 0619   GFRNONAA 28 (L) 09/25/2016 0619   GFRNONAA 29 (L) 09/25/2016 0619   GFRAA 32 (L) 09/25/2016 0619   GFRAA 34 (L) 09/25/2016 0619   CBC    Component Value Date/Time   WBC 13.7 (H) 09/25/2016 0619   RBC 3.71 (L) 09/25/2016 0619   HGB 10.9 (L) 09/25/2016 0619   HCT 32.5 (L) 09/25/2016 0619   PLT 198 09/25/2016 0619   MCV 87.6 09/25/2016 0619   MCH 29.4 09/25/2016 0619   MCHC 33.5 09/25/2016 0619   RDW 13.8 09/25/2016 0619   LYMPHSABS 0.6 (L) 09/22/2016 1100   MONOABS 1.1 (H) 09/22/2016 1100   EOSABS 0.0 09/22/2016 1100   BASOSABS 0.0 09/22/2016 1100    Assessment/Plan: 1.  AKI/CKD- in  setting of acute illness (cellulitis) antibiotics (large dose of Vanco) and concomitant ACE/HCTZ use with likely intravascular volume depletion.  She did have some microscopic hematuria and mild proteinuria and pyuria.  Likely due to ischemic ATN due to infection/volume depletion/ACE- inhibition +/- supratherapeutic Vancomycin trough.  Will continue to follow. 1. Complements normal, elevated ASO, negative ANCA, SPEP/UPEP pending as well as ANA  2. Scr improving with holding ACE/HCTZ, also with supratherapeutic vanco (trough 2 days after last dose was still elevated at 27) 3. Continue to follow UOP and Scr. 2. Cellulitis/panniculitis with abscess- on rocephin per primary and surgery following for possible I&D of abscess 3. HTN- off ACE/HCTZ and aldactone. BP's a little low.  Continue to follow. 4. Obesity 5. Diastolic CHF 6. H/o DVT and PE- possible pulmonary HTN/OSA, will need outpatient sleep study as given her body habitus she has a high pre-test probability.  Donetta Potts, MD [employer] [phone]

## 2016-09-26 DIAGNOSIS — N170 Acute kidney failure with tubular necrosis: Secondary | ICD-10-CM

## 2016-09-26 LAB — CREATININE, SERUM
Creatinine, Ser: 1.7 mg/dL — ABNORMAL HIGH (ref 0.44–1.00)
GFR, EST AFRICAN AMERICAN: 41 mL/min — AB (ref >60)
GFR, EST NON AFRICAN AMERICAN: 35 mL/min — AB (ref >60)

## 2016-09-26 LAB — CBC
HEMATOCRIT: 26.5 % — AB (ref 36.0–46.0)
HEMOGLOBIN: 9.2 g/dL — AB (ref 12.0–15.0)
MCH: 30.4 pg (ref 26.0–34.0)
MCHC: 34.7 g/dL (ref 30.0–36.0)
MCV: 87.5 fL (ref 78.0–100.0)
Platelets: 190 10*3/uL (ref 150–400)
RBC: 3.03 MIL/uL — AB (ref 3.87–5.11)
RDW: 14 % (ref 11.5–15.5)
WBC: 14.5 10*3/uL — ABNORMAL HIGH (ref 4.0–10.5)

## 2016-09-26 LAB — APTT: APTT: 95 s — AB (ref 24–36)

## 2016-09-26 LAB — PROTEIN, URINE, 24 HOUR
COLLECTION INTERVAL-UPROT: 24 h
Protein, 24H Urine: 473 mg/d — ABNORMAL HIGH (ref 50–100)
Urine Total Volume-UPROT: 1100 mL

## 2016-09-26 LAB — HEPARIN LEVEL (UNFRACTIONATED): HEPARIN UNFRACTIONATED: 0.83 [IU]/mL — AB (ref 0.30–0.70)

## 2016-09-26 LAB — GLUCOSE, CAPILLARY: Glucose-Capillary: 81 mg/dL (ref 65–99)

## 2016-09-26 MED ORDER — FUROSEMIDE 40 MG PO TABS
40.0000 mg | ORAL_TABLET | Freq: Every day | ORAL | Status: DC
Start: 2016-09-26 — End: 2016-09-29
  Administered 2016-09-26 – 2016-09-28 (×2): 40 mg via ORAL
  Filled 2016-09-26 (×4): qty 1

## 2016-09-26 NOTE — Progress Notes (Signed)
Patient ID: Caitlin Taylor, female   DOB: April 16, 1971, 46 y.o.   MRN: [id number] S:Feels better O:BP (!) 108/54 (BP Location: Right Arm)   Pulse 73   Temp 98.4 F (36.9 C) (Oral)   Resp 18   Ht 5\' 3"  (1.6 m)   Wt 123.8 kg (273 lb)   LMP 09/05/2016 Comment: Negative u-preg  SpO2 93%   BMI 48.36 kg/m   Intake/Output Summary (Last 24 hours) at 09/26/16 0839 Last data filed at 09/26/16 0532  Gross per 24 hour  Intake          1385.93 ml  Output              900 ml  Net           485.93 ml   Intake/Output: I/O last 3 completed shifts: In: 1865.4 [P.O.:600; I.V.:415.4; IV Piggyback:850] Out: 2050 [Urine:2050]  Intake/Output this shift:  No intake/output data recorded. Weight change: -0.363 kg (-12.8 oz) Gen:NAD CVS: no rub Resp:cta WSF:KCLE of induration and pain in right suprapubic/RLQ  Ext:1+ edema   Recent Labs Lab 09/22/16 1100 09/23/16 0547 09/24/16 0540 09/24/16 1150 09/25/16 0619 09/26/16 0537  NA 137 137  --  136 138  --   K 3.6 3.5  --  3.5 3.7  --   CL 101 104  --  105 103  --   CO2 27 24  --  23 24  --   GLUCOSE 113* 114*  --  143* 109*  --   BUN 25* 27*  --  25* 19  --   CREATININE 2.06* 2.12* 2.41* 2.29* 1.98*  2.07* 1.70*  ALBUMIN  --   --   --  3.3* 3.6  --   CALCIUM 9.1 8.5*  --  8.7* 9.2  --   PHOS  --   --   --   --  2.8  --   AST  --   --   --  20  --   --   ALT  --   --   --  29  --   --    Liver Function Tests:  Recent Labs Lab 09/24/16 1150 09/25/16 0619  AST 20  --   ALT 29  --   ALKPHOS 49  --   BILITOT 0.6  --   PROT 6.6  --   ALBUMIN 3.3* 3.6   No results for input(s): LIPASE, AMYLASE in the last 168 hours. No results for input(s): AMMONIA in the last 168 hours. CBC:  Recent Labs Lab 09/22/16 1100 09/23/16 0259 09/24/16 0540 09/25/16 0619 09/26/16 0537  WBC 12.6* 12.8* 12.3* 13.7* 14.5*  NEUTROABS 10.9*  --   --   --   --   HGB 11.3* 9.9* 9.6* 10.9* 9.2*  HCT 33.4* 28.6* 28.3* 32.5* 26.5*  MCV 88.8 88.0 86.3 87.6  87.5  PLT 156 155 155 198 190   Cardiac Enzymes:  Recent Labs Lab 09/24/16 1150  CKTOTAL 29*   CBG:  Recent Labs Lab 09/23/16 0742 09/24/16 0728 09/25/16 0746 09/26/16 0721  GLUCAP 125* 113* 100* 81    Iron Studies: No results for input(s): IRON, TIBC, TRANSFERRIN, FERRITIN in the last 72 hours. Studies/Results: US Renal  Result Date: 09/24/2016 CLINICAL DATA:  Acute renal failure EXAM: RENAL / URINARY TRACT ULTRASOUND COMPLETE COMPARISON:  Ultrasound 08/04/2016 FINDINGS: Right Kidney: Length: 11.6 cm. Echogenicity within normal limits. No mass or hydronephrosis visualized. Left Kidney: Length: 12.0 cm. Echogenicity within normal limits.  No mass or hydronephrosis visualized. Bladder: Appears normal for degree of bladder distention. IMPRESSION: Normal renal ultrasound. Electronically Signed   By: Suzy Bouchard M.D.   On: 09/24/2016 20:34   . bisacodyl  5 mg Oral Once  . cefTRIAXone (ROCEPHIN)  IV  1 g Intravenous Q24H  . diphenhydrAMINE  50 mg Oral Once  . doxycycline (VIBRAMYCIN) IV  100 mg Intravenous Q12H  . metoprolol tartrate  12.5 mg Oral BID  . polyethylene glycol  34 g Oral Once    BMET    Component Value Date/Time   NA 138 09/25/2016 0619   K 3.7 09/25/2016 0619   CL 103 09/25/2016 0619   CO2 24 09/25/2016 0619   GLUCOSE 109 (H) 09/25/2016 0619   BUN 19 09/25/2016 0619   CREATININE 1.70 (H) 09/26/2016 0537   CALCIUM 9.2 09/25/2016 0619   GFRNONAA 35 (L) 09/26/2016 0537   GFRAA 41 (L) 09/26/2016 0537   CBC    Component Value Date/Time   WBC 14.5 (H) 09/26/2016 0537   RBC 3.03 (L) 09/26/2016 0537   HGB 9.2 (L) 09/26/2016 0537   HCT 26.5 (L) 09/26/2016 0537   PLT 190 09/26/2016 0537   MCV 87.5 09/26/2016 0537   MCH 30.4 09/26/2016 0537   MCHC 34.7 09/26/2016 0537   RDW 14.0 09/26/2016 0537   LYMPHSABS 0.6 (L) 09/22/2016 1100   MONOABS 1.1 (H) 09/22/2016 1100   EOSABS 0.0 09/22/2016 1100   BASOSABS 0.0 09/22/2016 1100       Assessment/Plan: 1. AKI/CKD- in setting of acute illness (cellulitis) antibiotics (large dose of Vanco) and concomitant ACE/HCTZ use with likely intravascular volume depletion. She did have some microscopic hematuria and mild proteinuria and pyuria. Likely due to ischemic ATN due to infection/volume depletion/ACE- inhibition +/- supratherapeutic Vancomycin trough. Will continue to follow. 1. Complements normal, elevated ASO, negative ANCA, SPEP/UPEP pending as well as ANA  2. Scr improving with holding ACE/HCTZ, also with supratherapeutic vanco (trough 2 days after last dose was still elevated at 27) 3. Continue to follow UOP and Scr. 4. Ok to restart lasix 40mg  daily (she does not want the 80mg  as she never started these at her last discharge) 2. Cellulitis/panniculitis with abscess- on rocephin per primary and surgery following for possible I&D of abscess 3. HTN- off ACE/HCTZ and aldactone. BP's a little low. Continue to follow. 4. Obesity 5. Diastolic CHF 6. H/o DVT and PE- possible pulmonary HTN/OSA, will need outpatient sleep study as given her body habitus she has a high pre-test probability.   Donetta Potts, MD [employer] [phone]

## 2016-09-26 NOTE — Progress Notes (Signed)
Subjective: She reports pain is less in the abdominal wall slightly  Objective: Vital signs in last 24 hours: Temp:  [98.3 F (36.8 C)-98.4 F (36.9 C)] 98.4 F (36.9 C) (03/18 0516) Pulse Rate:  [73-77] 77 (03/18 0943) Resp:  [18] 18 (03/18 0516) BP: (107-126)/(43-66) 107/43 (03/18 0943) SpO2:  [93 %-100 %] 93 % (03/18 0516) Weight:  [123.8 kg (273 lb)] 123.8 kg (273 lb) (03/18 0531) Last BM Date: 09/26/16  Intake/Output from previous day: 03/17 0701 - 03/18 0700 In: 1385.9 [P.O.:480; I.V.:305.9; IV Piggyback:600] Out: 900 [Urine:900] Intake/Output this shift: No intake/output data recorded.  Exam: Looks comfortable Abdominal wall cellulitis persists and I think induration is worse today  Lab Results:   Recent Labs  09/25/16 0619 09/26/16 0537  WBC 13.7* 14.5*  HGB 10.9* 9.2*  HCT 32.5* 26.5*  PLT 198 190   BMET  Recent Labs  09/24/16 1150 09/25/16 0619 09/26/16 0537  NA 136 138  --   K 3.5 3.7  --   CL 105 103  --   CO2 23 24  --   GLUCOSE 143* 109*  --   BUN 25* 19  --   CREATININE 2.29* 1.98*  2.07* 1.70*  CALCIUM 8.7* 9.2  --    PT/INR No results for input(s): LABPROT, INR in the last 72 hours. ABG No results for input(s): PHART, HCO3 in the last 72 hours.  Invalid input(s): PCO2, PO2  Studies/Results: US Renal  Result Date: 09/24/2016 CLINICAL DATA:  Acute renal failure EXAM: RENAL / URINARY TRACT ULTRASOUND COMPLETE COMPARISON:  Ultrasound 08/04/2016 FINDINGS: Right Kidney: Length: 11.6 cm. Echogenicity within normal limits. No mass or hydronephrosis visualized. Left Kidney: Length: 12.0 cm. Echogenicity within normal limits. No mass or hydronephrosis visualized. Bladder: Appears normal for degree of bladder distention. IMPRESSION: Normal renal ultrasound. Electronically Signed   By: Suzy Bouchard M.D.   On: 09/24/2016 20:34    Anti-infectives: Anti-infectives    Start     Dose/Rate Route Frequency Ordered Stop   09/24/16 1000   doxycycline (VIBRAMYCIN) 100 mg in dextrose 5 % 250 mL IVPB     100 mg 125 mL/hr over 120 Minutes Intravenous Every 12 hours 09/24/16 0744     09/24/16 1000  cefTRIAXone (ROCEPHIN) 1 g in dextrose 5 % 50 mL IVPB     1 g 100 mL/hr over 30 Minutes Intravenous Every 24 hours 09/24/16 0926     09/23/16 1000  vancomycin (VANCOCIN) 1,500 mg in sodium chloride 0.9 % 500 mL IVPB  Status:  Discontinued     1,500 mg 250 mL/hr over 120 Minutes Intravenous Every 24 hours 09/22/16 1955 09/24/16 0735   09/22/16 2000  vancomycin (VANCOCIN) IVPB 1000 mg/200 mL premix  Status:  Discontinued     1,000 mg 200 mL/hr over 60 Minutes Intravenous  Once 09/22/16 1950 09/22/16 1953   09/22/16 2000  vancomycin (VANCOCIN) 2,000 mg in sodium chloride 0.9 % 500 mL IVPB     2,000 mg 250 mL/hr over 120 Minutes Intravenous  Once 09/22/16 1955 09/22/16 2222   09/22/16 1245  clindamycin (CLEOCIN) IVPB 600 mg     600 mg 100 mL/hr over 30 Minutes Intravenous  Once 09/22/16 1231 09/22/16 1634      Assessment/Plan: Abdominal wall panniculitis with possible abscess  WBC increased today. I think patient will probably need an I and D in the OR tomorrow so will make her NPO at midnight. Continue antibiotics  LOS: 4 days    Caitlin Taylor  09/26/2016 

## 2016-09-26 NOTE — Progress Notes (Signed)
[place] for IV heparin Indication: recent PE/DVT, on apixaban prior to admission.  No Known Allergies  Patient Measurements: Height: 5\' 3"  (160 cm) Weight: 273 lb (123.8 kg) IBW/kg (Calculated) : 52.4 Heparin Dosing Weight: 82.7 kg  Vital Signs: Temp: 98.4 F (36.9 C) (03/18 0516) Temp Source: Oral (03/18 0516) BP: 108/54 (03/18 0516) Pulse Rate: 73 (03/18 0516)  Labs:  Recent Labs  09/24/16 0540 09/24/16 1150 09/25/16 0619 09/25/16 1144 09/26/16 0537  HGB 9.6*  --  10.9*  --  9.2*  HCT 28.3*  --  32.5*  --  26.5*  PLT 155  --  198  --  190  APTT  --   --  67* 77* 95*  HEPARINUNFRC  --   --  1.16* 1.03* 0.83*  CREATININE 2.41* 2.29* 1.98*  2.07*  --  1.70*  CKTOTAL  --  29*  --   --   --     Estimated Creatinine Clearance: 53.4 mL/min (A) (by C-G formula based on SCr of 1.7 mg/dL (H)).   Medical History: Past Medical History:  Diagnosis Date  . Chronic back pain   . CKD (chronic kidney disease) stage 2, GFR 60-89 ml/min   . DVT (deep venous thrombosis) ([place])   . Hypertension   . Lumbar stenosis   . Pulmonary embolism ([place])    a. diagnosed in 08/2016. Started on Eliquis    Assessment: 77 yoF with PMH HTN, recent bilateral PE with DVT diagnosed 09/03/16 and currently on Eliquis, presents to [facility] with abdominal wall cellulitis with possible abscess. Transferred to [facility] with Surgery to evaluate for I&D. Converting Eliquis to heparin per pharmacy in anticipation of possible surgery.   Baseline heparin level> 2.2  Prior anticoagulation: Eliquis 5 mg bid, last dose 3/13 at 2000  AKI noted on admission with SCr 2.06 (baseline ~1.3)  Significant events:  Today, 09/26/2016:  Patient with high heparin level but PTT at goal at current rate of 1300 units/hr  CBC: hgb low, Plt wnl  No bleeding or infusion issues per RN  Renal function improving  Goal of Therapy: aPTT 66-102 seconds Heparin level 0.3-0.7  units/ml Monitor platelets by anticoagulation protocol: Yes  Plan:  Continue heparin infusion at 1300 units/hr  Daily CBC, daily heparin level and PTT until correlates  Monitor for signs of bleeding or thrombosis    Dolly Rias RPh 09/26/2016, 9:12 AM Pager [phone]

## 2016-09-26 NOTE — Evaluation (Signed)
Physical Therapy Evaluation & discharge Patient Details Name: Caitlin Taylor MRN: [id number] DOB: 10-22-1970 Today's Date: 09/26/2016   History of Present Illness  HPI: Caitlin Taylor is a 46 y.o. female with medical history significant for hypertension and recent diagnosis of DVT and PE and now managed with Eliquis, presenting in transfer from [facility] where she was seen for 1 week of worsening lower abdominal pain with redness and swelling. Pt found to have abdominal wall cellulitis with concern for underlying abscess. PMH includes L4-5 PLIF, HTN  Clinical Impression  Pt moving at MOD I level and educated on importance of mobility and okayed her to ambulate on the unit.  Nursing is aware and will be encouraging pt.  No further PT needs identified and will sign off. Please re-order if pt status changes.    Follow Up Recommendations No PT follow up    Equipment Recommendations  None recommended by PT    Recommendations for Other Services       Precautions / Restrictions        Mobility  Bed Mobility Overal bed mobility: Modified Independent                Transfers Overall transfer level: Modified independent                  Ambulation/Gait Ambulation/Gait assistance: Modified independent (Device/Increase time) Ambulation Distance (Feet): 300 Feet Assistive device: None (occasional use of rail < 20%) Gait Pattern/deviations: Step-through pattern;Wide base of support Gait velocity: decreased   General Gait Details: Pt ambulates with decreased speed with lateral sway and wide BOS. She uses rail occasionally, but < 20%  Stairs            Wheelchair Mobility    Modified Rankin (Stroke Patients Only)       Balance Overall balance assessment: No apparent balance deficits (not formally assessed)                                           Pertinent Vitals/Pain Pain Assessment: Faces Faces Pain Scale: Hurts  little more Pain Location: abdomen Pain Descriptors / Indicators: Grimacing Pain Intervention(s): Premedicated before session;Limited activity within patient's tolerance;Monitored during session    Home Living Family/patient expects to be discharged to:: Private residence Living Arrangements: Children (8 year old twins) Available Help at Discharge: Family;Available PRN/intermittently Type of Home: House Home Access: Stairs to enter Entrance Stairs-Rails: Left;Can reach Advertising account executive of Steps: 3 Home Layout: One level Home Equipment: Bedside commode      Prior Function Level of Independence: Independent         Comments: 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   Dominant Hand: Right    Extremity/Trunk Assessment   Upper Extremity Assessment Upper Extremity Assessment: Overall WFL for tasks assessed    Lower Extremity Assessment Lower Extremity Assessment: Overall WFL for tasks assessed    Cervical / Trunk Assessment Cervical / Trunk Assessment: Normal  Communication   Communication: No difficulties  Cognition Arousal/Alertness: Awake/alert Behavior During Therapy: WFL for tasks assessed/performed Overall Cognitive Status: Within Functional Limits for tasks assessed                      General Comments      Exercises     Assessment/Plan    PT  Assessment Patent does not need any further PT services  PT Problem List         PT Treatment Interventions      PT Goals (Current goals can be found in the Care Plan section)  Acute Rehab PT Goals PT Goal Formulation: All assessment and education complete, DC therapy    Frequency     Barriers to discharge        Co-evaluation               End of Session Equipment Utilized During Treatment: Gait belt Activity Tolerance: Patient tolerated treatment well Patient left: in chair;with call bell/phone within reach Nurse Communication: Mobility  status PT Visit Diagnosis: Difficulty in walking, not elsewhere classified (R26.2)         Time: 1425-1445 PT Time Calculation (min) (ACUTE ONLY): 20 min   Charges:   PT Evaluation $PT Eval Low Complexity: 1 Procedure     PT G Codes:         Ronee Ranganathan LUBECK 09/26/2016, 3:28 PM

## 2016-09-26 NOTE — Progress Notes (Addendum)
PROGRESS NOTE                                                                                                                                                                                                             Patient Demographics:    Caitlin Taylor, is a 46 y.o. female, DOB - [birth year], PNT:[id number]  Admit date - 09/22/2016   Admitting Physician [name], MD  Outpatient Primary MD for the patient is [name], MD  LOS - 4   Chief Complaint  Patient presents with  . Abdominal Pain     Brief Narrative   46 y.o. female with medical history significant for hypertension and recent diagnosis of DVT and PE and now managed with Eliquis, she was admitted with abdominal wall cellulitis, imaging with possible abscess versus phlegmon, followed by surgery, patient with creatinine of 2 on admission, Nephrotoxic medication has been held, followed by renal for acute renal failure.   Subjective:    [name] today has, No headache, No chest pain,No Nausea, No new weakness tingling or numbness, No Cough - SOB.Reports her abdominal pain is controlled.   Assessment  & Plan :    Principal Problem:   Abdominal wall cellulitis Active Problems:   Normocytic anemia   Morbid obesity due to excess calories (HCC)   CKD (chronic kidney disease), stage III   Benign essential HTN   History of pulmonary embolus (PE)   AKI (acute kidney injury) ([place])   Chronic diastolic CHF (congestive heart failure) (HCC)   Cellulitis   Abdominal wall cellulitis with possible abscess  - Pt presents with 1 wk of worsening tenderness in lower abd and a couple days of chills of malaise , mild leukocytosis and AKI on presentation, but no fever or tachycardia; CT findings suggest a developing abscess  - Gen surgery is consulting and much appreciated; holding Eliquis, on  heparin infusion for possible I&D   - Continue abx, supportive care; follow clinical  response - IV vancomycin on hold in the setting of acute kidney injury, transitioned to doxycycline and Rocephin,  - Cont to have worsening leukocytosis, plan for I&D tomorrow  Acute kidney injury superimposed on CKD stage III  - SCr is 2.06 on admission, baseline 1.3-1.5, peaked at  2.41, currently improving, creatinine of 1.7 today, resume on Lasix by renal - Continue  to Hold lisinopril, Lasix, Aldactone, and HCTZ - Renal consultation appreciated, most likely in the setting of ATN due to infection/volume depletion/ACE inhibitor .  Hx of DVT and PE  - Pt was diagnosed with DVT and b/l PE in February 2018  - She has been tolerating Eliquis well and there is no suggestion of acute VTE  - In anticipation of likely I&D, Eliquis is held on admission and she has been started on heparin infusion   Chronic diastolic CHF   - Pt appears dry on admission in setting of acute infection  - TTE (09/04/16) with EF 87-68%, grade 1 diastolic dysfunction, no WMA, and no significant valvular disease  - She is managed at home with Lopressor, lisinopril, Lasix, and Aldactone  - Lopressor is continued;  lisinopril, and Aldactone held on admission in light of AKI , giving improving renal function, has been resumed on Lasix   Hypertension - Blood pressure is acceptable, continue with metoprolol, continue to hold Lasix lisinopril and Aldactone   finding of thyroid nodule/adrenal adenoma in imaging - Patient reported this has been worked up by her new PCP, ultrasound done recently in outpatient setting   Code Status : Full  Family Communication  : None at bedside  Disposition Plan  : Home when stable  Consults  :  Gen surgery, renal  Procedures  : None  DVT Prophylaxis  :  Heparin GTT  Lab Results  Component Value Date   PLT 190 09/26/2016    Antibiotics  :   Anti-infectives    Start     Dose/Rate Route Frequency Ordered Stop   09/24/16 1000  doxycycline (VIBRAMYCIN) 100 mg in dextrose 5 %  250 mL IVPB     100 mg 125 mL/hr over 120 Minutes Intravenous Every 12 hours 09/24/16 0744     09/24/16 1000  cefTRIAXone (ROCEPHIN) 1 g in dextrose 5 % 50 mL IVPB     1 g 100 mL/hr over 30 Minutes Intravenous Every 24 hours 09/24/16 0926     09/23/16 1000  vancomycin (VANCOCIN) 1,500 mg in sodium chloride 0.9 % 500 mL IVPB  Status:  Discontinued     1,500 mg 250 mL/hr over 120 Minutes Intravenous Every 24 hours 09/22/16 1955 09/24/16 0735   09/22/16 2000  vancomycin (VANCOCIN) IVPB 1000 mg/200 mL premix  Status:  Discontinued     1,000 mg 200 mL/hr over 60 Minutes Intravenous  Once 09/22/16 1950 09/22/16 1953   09/22/16 2000  vancomycin (VANCOCIN) 2,000 mg in sodium chloride 0.9 % 500 mL IVPB     2,000 mg 250 mL/hr over 120 Minutes Intravenous  Once 09/22/16 1955 09/22/16 2222   09/22/16 1245  clindamycin (CLEOCIN) IVPB 600 mg     600 mg 100 mL/hr over 30 Minutes Intravenous  Once 09/22/16 1231 09/22/16 1634        Objective:   Vitals:   09/25/16 2106 09/26/16 0516 09/26/16 0531 09/26/16 0943  BP: 126/66 (!) 108/54  (!) 107/43  Pulse: 73 73  77  Resp: 18 18    Temp: 98.3 F (36.8 C) 98.4 F (36.9 C)    TempSrc: Oral Oral    SpO2: 100% 93%    Weight:   123.8 kg (273 lb)   Height:        Wt Readings from Last 3 Encounters:  09/26/16 123.8 kg (273 lb)  09/06/16 124.6 kg (274 lb 9.6 oz)  01/21/16 119.7 kg (264 lb)     Intake/Output Summary (Last  24 hours) at 09/26/16 1148 Last data filed at 09/26/16 0819  Gross per 24 hour  Intake          1625.93 ml  Output              900 ml  Net           725.93 ml     Physical Exam  Awake Alert, Oriented X 3 Supple Neck,No JVD,   Symmetrical Chest wall movement, Good air movement bilaterally, CTAB RRR,No Gallops,Rubs or new Murmurs, No Parasternal Heave +ve B.Sounds, Abd Soft, No rebound - guarding or rigidity, Cellulitis throughout lower right side pannus, some tenderness to palpation, Appears to be improving today. No  Cyanosis, Clubbing , No new Rash or bruise, +2 right lower extremity edema, +1 left lower extremity edema    Data Review:    CBC  Recent Labs Lab 09/22/16 1100 09/23/16 0259 09/24/16 0540 09/25/16 0619 09/26/16 0537  WBC 12.6* 12.8* 12.3* 13.7* 14.5*  HGB 11.3* 9.9* 9.6* 10.9* 9.2*  HCT 33.4* 28.6* 28.3* 32.5* 26.5*  PLT 156 155 155 198 190  MCV 88.8 88.0 86.3 87.6 87.5  MCH 30.1 30.5 29.3 29.4 30.4  MCHC 33.8 34.6 33.9 33.5 34.7  RDW 13.6 14.0 13.7 13.8 14.0  LYMPHSABS 0.6*  --   --   --   --   MONOABS 1.1*  --   --   --   --   EOSABS 0.0  --   --   --   --   BASOSABS 0.0  --   --   --   --     Chemistries   Recent Labs Lab 09/22/16 1100 09/23/16 0547 09/24/16 0540 09/24/16 1150 09/25/16 0619 09/26/16 0537  NA 137 137  --  136 138  --   K 3.6 3.5  --  3.5 3.7  --   CL 101 104  --  105 103  --   CO2 27 24  --  23 24  --   GLUCOSE 113* 114*  --  143* 109*  --   BUN 25* 27*  --  25* 19  --   CREATININE 2.06* 2.12* 2.41* 2.29* 1.98*  2.07* 1.70*  CALCIUM 9.1 8.5*  --  8.7* 9.2  --   AST  --   --   --  20  --   --   ALT  --   --   --  29  --   --   ALKPHOS  --   --   --  49  --   --   BILITOT  --   --   --  0.6  --   --    ------------------------------------------------------------------------------------------------------------------ No results for input(s): CHOL, HDL, LDLCALC, TRIG, CHOLHDL, LDLDIRECT in the last 72 hours.  Lab Results  Component Value Date   HGBA1C 5.0 01/06/2016   ------------------------------------------------------------------------------------------------------------------ No results for input(s): TSH, T4TOTAL, T3FREE, THYROIDAB in the last 72 hours.  Invalid input(s): FREET3 ------------------------------------------------------------------------------------------------------------------ No results for input(s): VITAMINB12, FOLATE, FERRITIN, TIBC, IRON, RETICCTPCT in the last 72 hours.  Coagulation profile No results for  input(s): INR, PROTIME in the last 168 hours.  No results for input(s): DDIMER in the last 72 hours.  Cardiac Enzymes No results for input(s): CKMB, TROPONINI, MYOGLOBIN in the last 168 hours.  Invalid input(s): CK ------------------------------------------------------------------------------------------------------------------    Component Value Date/Time   BNP 51.8 09/03/2016 2052    Inpatient Medications  Scheduled Meds: . bisacodyl  5 mg Oral Once  . cefTRIAXone (ROCEPHIN)  IV  1 g Intravenous Q24H  . diphenhydrAMINE  50 mg Oral Once  . doxycycline (VIBRAMYCIN) IV  100 mg Intravenous Q12H  . furosemide  40 mg Oral Daily  . metoprolol tartrate  12.5 mg Oral BID  . polyethylene glycol  34 g Oral Once   Continuous Infusions: . heparin 1,300 Units/hr (09/26/16 0312)   PRN Meds:.acetaminophen **OR** acetaminophen, bisacodyl, diphenhydrAMINE, HYDROcodone-acetaminophen, ondansetron **OR** ondansetron (ZOFRAN) IV, polyethylene glycol  Micro Results Recent Results (from the past 240 hour(s))  Culture, Urine     Status: None   Collection Time: 09/24/16  9:40 AM  Result Value Ref Range Status   Specimen Description URINE, RANDOM  Final   Special Requests NONE  Final   Culture   Final    NO GROWTH Performed at [facility], [address], [place], [place] [address]    Report Status 09/25/2016 FINAL  Final    Radiology Reports Dg Chest 2 View  Result Date: 09/03/2016 CLINICAL DATA:  Shortness of breath 1 month with cough and chest pain as well as wheezing. EXAM: CHEST  2 VIEW COMPARISON:  None. FINDINGS: Lungs are adequately inflated without focal consolidation or effusion. Cardiomediastinal silhouette is within normal. Remaining bones and soft tissues are within normal. IMPRESSION: No active cardiopulmonary disease. Electronically Signed   By: Marin Olp M.D.   On: 09/03/2016 21:33   Ct Angio Chest Pe W And/or Wo Contrast  Result Date: 09/03/2016 CLINICAL DATA:   Acute onset of shortness of breath with exertion. Bilateral lower extremity swelling. Initial encounter. EXAM: CT ANGIOGRAPHY CHEST WITH CONTRAST TECHNIQUE: Multidetector CT imaging of the chest was performed using the standard protocol during bolus administration of intravenous contrast. Multiplanar CT image reconstructions and MIPs were obtained to evaluate the vascular anatomy. CONTRAST:  80 mL of Isovue 370 IV contrast COMPARISON:  Chest radiograph performed earlier today at 9:20 p.m. FINDINGS: Cardiovascular: There is pulmonary embolus within the right main pulmonary artery, extending into all lobes of the right lung. There is also segmental pulmonary embolus within a pulmonary artery to the left lower lobe. The RV/LV ratio is just above 0.9, corresponding to mild right heart strain and at least submassive pulmonary embolus. The heart is otherwise unremarkable in appearance. The thoracic aorta is unremarkable. No calcific atherosclerotic disease is seen. The vessels are grossly unremarkable. Mediastinum/Nodes: The mediastinum is otherwise unremarkable in appearance. No mediastinal lymphadenopathy is seen. No pericardial effusion is identified. A 2.4 cm exophytic nodule is seen arising at the inferior aspect of the left thyroid lobe. No axillary lymphadenopathy is seen. Lungs/Pleura: Minimal bibasilar atelectasis is noted. No pleural effusion or pneumothorax is seen. No masses are identified. Upper Abdomen: The visualized portions of the liver and spleen are grossly unremarkable. The visualized portions of the gallbladder, pancreas, right and kidneys are grossly unremarkable. A 2.2 cm left adrenal adenoma is noted. Nonspecific left-sided perinephric stranding is seen. Musculoskeletal: No acute osseous abnormalities are identified. The visualized musculature is unremarkable in appearance. Review of the MIP images confirms the above findings. IMPRESSION: 1. Pulmonary embolus within the right main pulmonary artery,  extending into all lobes of the right lung. Segmental pulmonary embolus within the pulmonary artery to the left lower lobe. CT evidence of right heart strain (RV/LV Ratio = 0.91) consistent with at least submassive (intermediate risk) PE. The presence of right heart strain has been associated with an increased risk of morbidity and mortality. Please activate  Code PE by paging [phone]. 2. Minimal bibasilar atelectasis noted.  Lungs otherwise clear. 3. **An incidental finding of potential clinical significance has been found. 2.4 cm exophytic nodule arising at the inferior aspect of the left thyroid lobe. Recommend further evaluation with thyroid ultrasound. If patient is clinically hyperthyroid, consider nuclear medicine thyroid uptake and scan.** 4. 2.2 cm left adrenal adenoma noted. Critical Value/emergent results were called by telephone at the time of interpretation on 09/03/2016 at 11:22 pm to Dr. [name], who verbally acknowledged these results. Electronically Signed   By: Garald Balding M.D.   On: 09/03/2016 23:30   Ct Pelvis Wo Contrast  Result Date: 09/22/2016 CLINICAL DATA:  Red, warm, extremely painful region lower anterior abdominal wall EXAM: CT PELVIS WITHOUT CONTRAST TECHNIQUE: Multidetector CT imaging of the pelvis was performed following the standard protocol without intravenous contrast. COMPARISON:  None. FINDINGS: Urinary Tract:  Unremarkable. Bowel:  Unremarkable. Vascular/Lymphatic: Unremarkable. Reproductive:  Unremarkable. Other:  No free fluid. Musculoskeletal: 4.1 x 5.0 cm irregular soft tissue attenuating lesion is identified in the lower right pannus with surrounding edema/ inflammation and overlying skin thickening. Lesion not fully evaluated given the lack of intravenous contrast material. Bone windows reveal no worrisome lytic or sclerotic osseous lesions. Patient is status post lower lumbar fusion. IMPRESSION: 1. 4.1 x 5.0 cm irregular soft tissue attenuating lesion in  the subcutaneous fat of the lower right pannus. Given clinical history and the overlying skin thickening and adjacent edema/inflammation, this is probably phlegmon/evolving abscess. Subcutaneous hematoma or subcutaneous metastatic lesion cannot be excluded by imaging alone. Electronically Signed   By: Misty Stanley M.D.   On: 09/22/2016 12:23   US Renal  Result Date: 09/24/2016 CLINICAL DATA:  Acute renal failure EXAM: RENAL / URINARY TRACT ULTRASOUND COMPLETE COMPARISON:  Ultrasound 08/04/2016 FINDINGS: Right Kidney: Length: 11.6 cm. Echogenicity within normal limits. No mass or hydronephrosis visualized. Left Kidney: Length: 12.0 cm. Echogenicity within normal limits. No mass or hydronephrosis visualized. Bladder: Appears normal for degree of bladder distention. IMPRESSION: Normal renal ultrasound. Electronically Signed   By: Suzy Bouchard M.D.   On: 09/24/2016 20:34   US Thyroid  Result Date: 09/21/2016 CLINICAL DATA:  Incidental on CT.  Left thyroid nodule noted on CT EXAM: THYROID ULTRASOUND TECHNIQUE: Ultrasound examination of the thyroid gland and adjacent soft tissues was performed. COMPARISON:  None. FINDINGS: Parenchymal Echotexture: Normal Isthmus: 0.4 cm Right lobe: 5.3 x 1.7 x 2.1 cm Left lobe: 5.2 x 1.4 x 2.0 cm _________________________________________________________ Estimated total number of nodules >/= 1 cm: 0 Number of spongiform nodules >/=  2 cm not described below (TR1): 0 Number of mixed cystic and solid nodules >/= 1.5 cm not described below (TR2): 0 _________________________________________________________ Small nodules and cysts measuring 0.5 cm or less are scattered throughout the left lobe and have a benign appearance. IMPRESSION: Tiny nodules and cysts in left lobe were present. None meet criteria for fine needle aspiration biopsy or annual follow-up. The above is in keeping with the ACR TI-RADS recommendations - J Am Coll Radiol 2017;14:587-595. Electronically Signed   By:  Marybelle Killings M.D.   On: 09/21/2016 15:28    ELGERGAWY, DAWOOD M.D on 09/26/2016 at 11:48 AM  Between 7am to 7pm - Pager - [phone]  After 7pm go to [email or web address] - password [facility]  [facility] -  Office  [phone]

## 2016-09-27 ENCOUNTER — Encounter (HOSPITAL_COMMUNITY)
Admission: EM | Disposition: A | Discharge status: Home Health | Payer: Self-pay | Source: Home / Self Care | Attending: Internal Medicine

## 2016-09-27 ENCOUNTER — Inpatient Hospital Stay (HOSPITAL_COMMUNITY): Payer: 59 | Admitting: Registered Nurse

## 2016-09-27 ENCOUNTER — Encounter (HOSPITAL_COMMUNITY): Payer: Self-pay | Admitting: Certified Registered Nurse Anesthetist

## 2016-09-27 HISTORY — PX: IRRIGATION AND DEBRIDEMENT ABSCESS: SHX5252

## 2016-09-27 LAB — CBC
HCT: 27.7 % — ABNORMAL LOW (ref 36.0–46.0)
Hemoglobin: 9.4 g/dL — ABNORMAL LOW (ref 12.0–15.0)
MCH: 29.2 pg (ref 26.0–34.0)
MCHC: 33.9 g/dL (ref 30.0–36.0)
MCV: 86 fL (ref 78.0–100.0)
Platelets: 215 K/uL (ref 150–400)
RBC: 3.22 MIL/uL — ABNORMAL LOW (ref 3.87–5.11)
RDW: 13.8 % (ref 11.5–15.5)
WBC: 13.8 K/uL — ABNORMAL HIGH (ref 4.0–10.5)

## 2016-09-27 LAB — RENAL FUNCTION PANEL
ANION GAP: 10 (ref 5–15)
Albumin: 3.2 g/dL — ABNORMAL LOW (ref 3.5–5.0)
BUN: 14 mg/dL (ref 6–20)
CO2: 25 mmol/L (ref 22–32)
Calcium: 9.1 mg/dL (ref 8.9–10.3)
Chloride: 104 mmol/L (ref 101–111)
Creatinine, Ser: 1.65 mg/dL — ABNORMAL HIGH (ref 0.44–1.00)
GFR calc non Af Amer: 37 mL/min — ABNORMAL LOW (ref >60)
GFR, EST AFRICAN AMERICAN: 42 mL/min — AB (ref >60)
Glucose, Bld: 99 mg/dL (ref 65–99)
PHOSPHORUS: 2.9 mg/dL (ref 2.5–4.6)
Potassium: 3.7 mmol/L (ref 3.5–5.1)
SODIUM: 139 mmol/L (ref 135–145)

## 2016-09-27 LAB — HEPARIN LEVEL (UNFRACTIONATED): Heparin Unfractionated: 0.56 IU/mL (ref 0.30–0.70)

## 2016-09-27 LAB — MRSA PCR SCREENING: MRSA BY PCR: INVALID — AB

## 2016-09-27 LAB — GLUCOSE, CAPILLARY: Glucose-Capillary: 84 mg/dL (ref 65–99)

## 2016-09-27 LAB — APTT: aPTT: 72 s — ABNORMAL HIGH (ref 24–36)

## 2016-09-27 SURGERY — IRRIGATION AND DEBRIDEMENT ABSCESS
Anesthesia: General

## 2016-09-27 MED ORDER — PROPOFOL 10 MG/ML IV BOLUS
INTRAVENOUS | Status: DC | PRN
  Administered 2016-09-27: 200 mg via INTRAVENOUS

## 2016-09-27 MED ORDER — MIDAZOLAM HCL 5 MG/5ML IJ SOLN
INTRAMUSCULAR | Status: DC | PRN
  Administered 2016-09-27: 2 mg via INTRAVENOUS

## 2016-09-27 MED ORDER — SUCCINYLCHOLINE CHLORIDE 20 MG/ML IJ SOLN
INTRAMUSCULAR | Status: DC | PRN
  Administered 2016-09-27: 120 mg via INTRAVENOUS

## 2016-09-27 MED ORDER — SUGAMMADEX SODIUM 200 MG/2ML IV SOLN
INTRAVENOUS | Status: DC | PRN
  Administered 2016-09-27: 200 mg via INTRAVENOUS

## 2016-09-27 MED ORDER — MEPERIDINE HCL 50 MG/ML IJ SOLN: 6.2500 mg | INTRAMUSCULAR | Status: DC | PRN

## 2016-09-27 MED ORDER — SENNOSIDES-DOCUSATE SODIUM 8.6-50 MG PO TABS
2.0000 | ORAL_TABLET | Freq: Two times a day (BID) | ORAL | Status: DC
  Administered 2016-09-27 – 2016-09-29 (×2): 2 via ORAL
  Filled 2016-09-27 (×7): qty 2

## 2016-09-27 MED ORDER — HYDROMORPHONE HCL 1 MG/ML IJ SOLN
INTRAMUSCULAR | Status: AC
  Filled 2016-09-27: qty 1

## 2016-09-27 MED ORDER — LACTATED RINGERS IV SOLN
INTRAVENOUS | Status: DC | PRN
  Administered 2016-09-27 (×2): via INTRAVENOUS

## 2016-09-27 MED ORDER — FENTANYL CITRATE (PF) 250 MCG/5ML IJ SOLN
INTRAMUSCULAR | Status: AC
  Filled 2016-09-27: qty 5

## 2016-09-27 MED ORDER — METOPROLOL TARTRATE 12.5 MG HALF TABLET
12.5000 mg | ORAL_TABLET | Freq: Once | ORAL | Status: AC
  Administered 2016-09-27: 12.5 mg via ORAL
  Filled 2016-09-27: qty 1

## 2016-09-27 MED ORDER — LABETALOL HCL 5 MG/ML IV SOLN
INTRAVENOUS | Status: DC | PRN
  Administered 2016-09-27 (×2): 5 mg via INTRAVENOUS

## 2016-09-27 MED ORDER — FENTANYL CITRATE (PF) 100 MCG/2ML IJ SOLN
INTRAMUSCULAR | Status: DC | PRN
  Administered 2016-09-27: 100 ug via INTRAVENOUS
  Administered 2016-09-27: 50 ug via INTRAVENOUS
  Administered 2016-09-27: 100 ug via INTRAVENOUS
  Administered 2016-09-27: 50 ug via INTRAVENOUS
  Administered 2016-09-27: 100 ug via INTRAVENOUS
  Administered 2016-09-27: 50 ug via INTRAVENOUS

## 2016-09-27 MED ORDER — FENTANYL CITRATE (PF) 100 MCG/2ML IJ SOLN
INTRAMUSCULAR | Status: AC
  Filled 2016-09-27: qty 2

## 2016-09-27 MED ORDER — PHENOL 1.4 % MT LIQD
1.0000 | OROMUCOSAL | Status: DC | PRN
  Filled 2016-09-27: qty 177

## 2016-09-27 MED ORDER — MIDAZOLAM HCL 2 MG/2ML IJ SOLN: 0.5000 mg | Freq: Once | INTRAMUSCULAR | Status: DC | PRN

## 2016-09-27 MED ORDER — GLYCOPYRROLATE 0.2 MG/ML IJ SOLN
INTRAMUSCULAR | Status: DC | PRN
  Administered 2016-09-27: 0.2 mg via INTRAVENOUS

## 2016-09-27 MED ORDER — ROCURONIUM 10MG/ML (10ML) SYRINGE FOR MEDFUSION PUMP - OPTIME
INTRAVENOUS | Status: DC | PRN
  Administered 2016-09-27: 30 mg via INTRAVENOUS

## 2016-09-27 MED ORDER — ONDANSETRON HCL 4 MG/2ML IJ SOLN
INTRAMUSCULAR | Status: AC
  Filled 2016-09-27: qty 2

## 2016-09-27 MED ORDER — ONDANSETRON HCL 4 MG/2ML IJ SOLN
INTRAMUSCULAR | Status: DC | PRN
Start: 2016-09-27 — End: 2016-09-27
  Administered 2016-09-27: 4 mg via INTRAVENOUS

## 2016-09-27 MED ORDER — FENTANYL CITRATE (PF) 100 MCG/2ML IJ SOLN
25.0000 ug | INTRAMUSCULAR | Status: DC | PRN
  Administered 2016-09-27: 50 ug via INTRAVENOUS

## 2016-09-27 MED ORDER — ALBUTEROL SULFATE HFA 108 (90 BASE) MCG/ACT IN AERS
INHALATION_SPRAY | RESPIRATORY_TRACT | Status: AC
  Filled 2016-09-27: qty 6.7

## 2016-09-27 MED ORDER — BUPIVACAINE-EPINEPHRINE 0.25% -1:200000 IJ SOLN
INTRAMUSCULAR | Status: AC
  Filled 2016-09-27: qty 1

## 2016-09-27 MED ORDER — LIDOCAINE HCL (CARDIAC) 10 MG/ML IV SOLN
INTRAVENOUS | Status: DC | PRN
  Administered 2016-09-27: 60 mg via INTRAVENOUS

## 2016-09-27 MED ORDER — PROPOFOL 10 MG/ML IV BOLUS
INTRAVENOUS | Status: AC
  Filled 2016-09-27: qty 20

## 2016-09-27 MED ORDER — 0.9 % SODIUM CHLORIDE (POUR BTL) OPTIME
TOPICAL | Status: DC | PRN
  Administered 2016-09-27: 1000 mL

## 2016-09-27 MED ORDER — HEPARIN (PORCINE) IN NACL 100-0.45 UNIT/ML-% IJ SOLN
1300.0000 [IU]/h | INTRAMUSCULAR | Status: DC
  Administered 2016-09-27 – 2016-09-28 (×2): 1300 [IU]/h via INTRAVENOUS
  Filled 2016-09-27 (×3): qty 250

## 2016-09-27 MED ORDER — PROMETHAZINE HCL 25 MG/ML IJ SOLN: 6.2500 mg | INTRAMUSCULAR | Status: DC | PRN

## 2016-09-27 MED ORDER — MORPHINE SULFATE (PF) 4 MG/ML IV SOLN
1.0000 mg | INTRAVENOUS | Status: DC | PRN
  Administered 2016-09-27 – 2016-09-29 (×5): 2 mg via INTRAVENOUS
  Filled 2016-09-27 (×5): qty 1

## 2016-09-27 MED ORDER — LACTATED RINGERS IV SOLN
INTRAVENOUS | Status: DC
  Administered 2016-09-27 – 2016-09-28 (×2): via INTRAVENOUS

## 2016-09-27 MED ORDER — MIDAZOLAM HCL 2 MG/2ML IJ SOLN
INTRAMUSCULAR | Status: AC
  Filled 2016-09-27: qty 2

## 2016-09-27 SURGICAL SUPPLY — 41 items
BNDG GAUZE ELAST 4 BULKY (GAUZE/BANDAGES/DRESSINGS) ×3 IMPLANT
COVER SURGICAL LIGHT HANDLE (MISCELLANEOUS) ×3 IMPLANT
DECANTER SPIKE VIAL GLASS SM (MISCELLANEOUS) IMPLANT
DERMABOND ADVANCED (GAUZE/BANDAGES/DRESSINGS)
DERMABOND ADVANCED .7 DNX12 (GAUZE/BANDAGES/DRESSINGS) IMPLANT
DRAIN PENROSE 18X1/2 LTX STRL (DRAIN) ×3 IMPLANT
DRAPE LAPAROSCOPIC ABDOMINAL (DRAPES) IMPLANT
DRAPE LAPAROTOMY T 102X78X121 (DRAPES) IMPLANT
DRAPE LAPAROTOMY T 98X78 PEDS (DRAPES) IMPLANT
DRAPE LAPAROTOMY TRNSV 102X78 (DRAPE) ×3 IMPLANT
DRAPE SHEET LG 3/4 BI-LAMINATE (DRAPES) IMPLANT
DRAPE UTILITY XL STRL (DRAPES) ×3 IMPLANT
DRSG PAD ABDOMINAL 8X10 ST (GAUZE/BANDAGES/DRESSINGS) IMPLANT
ELECT REM PT RETURN 9FT ADLT (ELECTROSURGICAL) ×3
ELECTRODE REM PT RTRN 9FT ADLT (ELECTROSURGICAL) ×1 IMPLANT
GAUZE PACKING 1/2X5YD (GAUZE/BANDAGES/DRESSINGS) ×3 IMPLANT
GAUZE PACKING IODOFORM 1/4X15 (GAUZE/BANDAGES/DRESSINGS) IMPLANT
GAUZE SPONGE 4X4 12PLY STRL (GAUZE/BANDAGES/DRESSINGS) ×3 IMPLANT
GAUZE SPONGE 4X4 16PLY XRAY LF (GAUZE/BANDAGES/DRESSINGS) ×3 IMPLANT
GLOVE BIO SURGEON STRL SZ7.5 (GLOVE) ×3 IMPLANT
GLOVE INDICATOR 8.0 STRL GRN (GLOVE) ×3 IMPLANT
GOWN STRL REUS W/TWL XL LVL3 (GOWN DISPOSABLE) ×6 IMPLANT
HANDPIECE INTERPULSE COAX TIP (DISPOSABLE)
KIT BASIN OR (CUSTOM PROCEDURE TRAY) ×3 IMPLANT
MARKER SKIN DUAL TIP RULER LAB (MISCELLANEOUS) ×3 IMPLANT
NEEDLE HYPO 25X1 1.5 SAFETY (NEEDLE) IMPLANT
PACK GENERAL/GYN (CUSTOM PROCEDURE TRAY) ×3 IMPLANT
PAD ABD 8X10 STRL (GAUZE/BANDAGES/DRESSINGS) ×3 IMPLANT
SET HNDPC FAN SPRY TIP SCT (DISPOSABLE) IMPLANT
SPONGE LAP 18X18 X RAY DECT (DISPOSABLE) ×3 IMPLANT
SPONGE LAP 4X18 X RAY DECT (DISPOSABLE) IMPLANT
STAPLER VISISTAT 35W (STAPLE) IMPLANT
SUT ETHILON 2 0 PS N (SUTURE) ×3 IMPLANT
SUT MNCRL AB 4-0 PS2 18 (SUTURE) IMPLANT
SUT VIC AB 3-0 SH 18 (SUTURE) IMPLANT
SWAB COLLECTION DEVICE MRSA (MISCELLANEOUS) ×3 IMPLANT
SWAB CULTURE ESWAB REG 1ML (MISCELLANEOUS) ×3 IMPLANT
SYR CONTROL 10ML LL (SYRINGE) IMPLANT
TAPE CLOTH SURG 6X10 WHT LF (GAUZE/BANDAGES/DRESSINGS) ×3 IMPLANT
TOWEL OR 17X26 10 PK STRL BLUE (TOWEL DISPOSABLE) ×3 IMPLANT
TOWEL OR NON WOVEN STRL DISP B (DISPOSABLE) ×3 IMPLANT

## 2016-09-27 NOTE — Transfer of Care (Signed)
Immediate Anesthesia Transfer of Care Note  Patient: Caitlin Taylor  Procedure(s) Performed: Procedure(s): IRRIGATION AND DEBRIDEMENT OF ABDOMINAL WALL ABSCESS (N/A)  Patient Location: PACU  Anesthesia Type:General  Level of Consciousness: awake, alert , oriented and patient cooperative  Airway & Oxygen Therapy: Patient Spontanous Breathing and Patient connected to face mask oxygen  Post-op Assessment: Report given to RN, Post -op Vital signs reviewed and stable and Patient moving all extremities X 4  Post vital signs: stable  Last Vitals:  Vitals:   09/27/16 1430 09/27/16 1445  BP: (!) 145/91 133/82  Pulse: 80 78  Resp: (!) 21 11  Temp:      Last Pain:  Vitals:   09/27/16 1445  TempSrc:   PainSc: 9       Patients Stated Pain Goal: 1 (46/96/29 5284)  Complications: No apparent anesthesia complications

## 2016-09-27 NOTE — Progress Notes (Signed)
Patient ID: Caitlin Taylor, female   DOB: [birth year], 46 y.o.   MRN: [id number] S: No c/o  O:BP 137/83 (BP Location: Left Arm)   Pulse 67   Temp 98.3 F (36.8 C)   Resp 12   Ht 5\' 3"  (1.6 m)   Wt 124.8 kg (275 lb 3.2 oz)   LMP 09/05/2016 Comment: Negative u-preg  SpO2 97%   BMI 48.75 kg/m   Intake/Output Summary (Last 24 hours) at 09/27/16 1516 Last data filed at 09/27/16 1424  Gross per 24 hour  Intake             1646 ml  Output              800 ml  Net              846 ml   Intake/Output: I/O last 3 completed shifts: In: 2108 [P.O.:840; I.V.:468; IV Piggyback:800] Out: 3335 [Urine:1550]  Intake/Output this shift:  Total I/O In: 1000 [I.V.:1000] Out: 50 [Blood:50] Weight change: 0.998 kg (2 lb 3.2 oz) Gen:NAD CVS: no rub Resp:cta KTG:YBWL of induration and pain in right suprapubic/RLQ  Ext:1+ edema   Assessment/Plan: AKI/CKD- in setting of infection/ acute illness / vol depletion and ACEi.  Resolving.  Will sign off.  Please avoid all ACEi/ ARB's for 4 weeks minimum.  1. Cellulitis/panniculitis with abscess- on rocephin, to OR today 2. HTN- as above 3. Obesity 4. Diastolic CHF 5. H/o DVT and PE- possible pulmonary HTN/OSA, will need outpatient sleep study as given her body habitus she has a high pre-test probability.   Kelly Splinter MD [employer] pgr [phone]   09/27/2016, 3:18 PM

## 2016-09-27 NOTE — Anesthesia Postprocedure Evaluation (Signed)
Anesthesia Post Note  Patient: Caitlin Taylor  Procedure(s) Performed: Procedure(s) (LRB): IRRIGATION AND DEBRIDEMENT OF ABDOMINAL WALL ABSCESS (N/A)  Patient location during evaluation: PACU Anesthesia Type: General Level of consciousness: awake and alert Pain management: pain level controlled Vital Signs Assessment: post-procedure vital signs reviewed and stable Respiratory status: spontaneous breathing, nonlabored ventilation, respiratory function stable and patient connected to nasal cannula oxygen Cardiovascular status: blood pressure returned to baseline and stable Postop Assessment: no signs of nausea or vomiting Anesthetic complications: no       Last Vitals:  Vitals:   09/27/16 1515 09/27/16 1538  BP: (!) 155/86 137/79  Pulse: 67 72  Resp: 16 18  Temp:  36.6 C    Last Pain:  Vitals:   09/27/16 1556  TempSrc:   PainSc: 8                  Tiajuana Amass

## 2016-09-27 NOTE — Op Note (Signed)
Caitlin Taylor, Caitlin Taylor NO.:  [id number]  MEDICAL RECORD NO.:  [id number]  LOCATION:  1103                         FACILITY:  [facility]  PHYSICIAN:  Leighton Ruff. Redmond Pulling, MD, FACSDATE OF BIRTH:  September 14, 1970  DATE OF PROCEDURE:  09/27/2016 DATE OF DISCHARGE:                              OPERATIVE REPORT   PREOPERATIVE DIAGNOSIS:  Right lower quadrant abdominal abscess, panniculitis.  POSTOPERATIVE DIAGNOSIS:  Right lower quadrant abdominal abscess, panniculitis.  PROCEDURE:  Incision and debridement of abdominal wall abscess with scalpel. (3 x 9 cm skin and soft tissue with scalpel, initial debridement)  SURGEON:  Gayland Curry MD.  ASSISTANT:  Janace Hoard, PA student.  ANESTHESIA:  General.  ESTIMATED BLOOD LOSS:  Less than 25 mL.  SPECIMEN:  Aerobic and anaerobic cultures.  COMPLICATIONS:  None apparent.  INDICATIONS:  The patient is a 46 year old obese African American female, who was admitted with panniculitis.  Her cellulitis and induration became more localized over the weekend and she still had ongoing pain and significant induration.  Based on this, I was concerned about an underlying abscess.  I recommended incision and debridement in the operating room.  She has a history of recent PE and DVT.  We discussed the increased perioperative risk of blood clot formation.  Her heparin drip was held at 8:00 a.m. this morning and she was brought to the operating room around 1:30 p.m.  A surgical time-out was performed.  DESCRIPTION OF PROCEDURE:  After obtaining informed consent, the patient was brought to OR 4 at [facility] and placed supine on the operating room table.  General LMA anesthesia was established.  A surgical time-out was performed.  She was on broad-spectrum therapeutic IV antibiotics.  She had a maximal area of induration of 15 cm by approximately 11 cm in the right lower quadrant abdominal wall pannus. The cellulitis extended out  laterally toward her right flank.  I did an elliptical incision with a marking pen and then the PA student incised the skin with me directly supervising.  There was immediate drainage of a large amount of seropurulent fluid, which was taken for culture with aerobic and anaerobic.  We then ended up completely elliptically excising the skin as well as some soft tissue.  This left a large abscess cavity.  It did track slightly more lateral and I did make a counter incision about 3 inches out lateral.  The cavity was irrigated. Hemostasis was achieved.  The abscess cavity was packed with a Kerlix gauze.  I then threaded a Penrose drain through the main abscess cavity out through the counter incision and secured it to itself with a 2-0 nylon suture.  Some iodoform gauze was packed into the incision where the Penrose encounters have been made.  We then placed dry gauze and tape on the skin.  The area of debridement of skin and soft tissue was 3 cm wide by 9 cm long.  This included skin and subcutaneous fat down to muscle.  It was done with scalpel.  The patient tolerated the procedure well.  She was extubated and brought to the recovery room in stable condition.  All needle, instrument, and sponge counts were  correct x2. There were no immediate complications.  The patient tolerated procedure well.     Leighton Ruff. Redmond Pulling, MD, FACS     EMW/MEDQ  D:  09/27/2016  T:  09/27/2016  Job:  [id number]

## 2016-09-27 NOTE — Anesthesia Procedure Notes (Signed)
Procedure Name: Intubation Performed by: Gean Maidens Pre-anesthesia Checklist: Patient identified, Emergency Drugs available, Suction available, Patient being monitored and Timeout performed Patient Re-evaluated:Patient Re-evaluated prior to inductionOxygen Delivery Method: Circle system utilized Preoxygenation: Pre-oxygenation with 100% oxygen Intubation Type: IV induction Ventilation: Mask ventilation without difficulty Laryngoscope Size: Mac and 4 Grade View: Grade II Tube type: Oral Tube size: 7.5 mm Number of attempts: 1 Airway Equipment and Method: Stylet Placement Confirmation: CO2 detector,  positive ETCO2,  breath sounds checked- equal and bilateral and ETT inserted through vocal cords under direct vision Secured at: 22 cm Tube secured with: Tape Dental Injury: Teeth and Oropharynx as per pre-operative assessment  Difficulty Due To: Difficult Airway- due to reduced neck mobility and Difficulty was anticipated

## 2016-09-27 NOTE — Progress Notes (Signed)
PROGRESS NOTE                                                                                                                                                                                                             Patient Demographics:    Caitlin Taylor, is a 46 y.o. female, DOB - 05/09/1971, CHY:[id number]  Admit date - 09/22/2016   Admitting Physician [name], MD  Outpatient Primary MD for the patient is [name], MD  LOS - 5   Chief Complaint  Patient presents with  . Abdominal Pain     Brief Narrative   46 y.o. female with medical history significant for hypertension and recent diagnosis of DVT and PE and now managed with Eliquis, she was admitted with abdominal wall cellulitis, imaging with possible abscess versus phlegmon, followed by surgery, patient with creatinine of 2 on admission, Nephrotoxic medication has been held, followed by renal for acute renal failure, No function improving slowly, and Timentin was significant induration in the right lower abdomen, planned for I&D by surgery on 3/19   Subjective:    Caitlin Taylor today has, No headache, No chest pain,No Nausea, No new weakness tingling or numbness, No Cough - SOB.Reports her abdominal pain is controlled.   Assessment  & Plan :    Principal Problem:   Abdominal wall cellulitis Active Problems:   Normocytic anemia   Morbid obesity due to excess calories (HCC)   CKD (chronic kidney disease), stage III   Benign essential HTN   History of pulmonary embolus (PE)   AKI (acute kidney injury) ([place])   Chronic diastolic CHF (congestive heart failure) (HCC)   Cellulitis   Abdominal wall cellulitis with possible abscess  - Pt presents with 1 wk of worsening tenderness in lower abd and a couple days of chills of malaise , mild leukocytosis and AKI on presentation, but no fever or tachycardia; CT findings suggest a developing abscess  - Gen surgery is consulting and  much appreciated; holding Eliquis, on  heparin infusion for I&D,  Infection currently appears to be more focal, with area of induration, so she will need I&D for excision of the area to expedite resolution of infection per surgery ,  - Continue abx, supportive care; follow clinical response - IV vancomycin on hold in the setting of acute kidney injury, transitioned to  IV doxycycline and Rocephin,   Acute kidney injury superimposed on CKD stage III  - SCr is 2.06 on admission, baseline 1.3-1.5, peaked at  2.41, currently improving, creatinine of 1.6 today, continue to monitor closely as on Lasix. - Continue to Hold lisinopril, Aldactone, and HCTZ - Renal consultation appreciated, most likely in the setting of ATN due to infection/volume depletion/ACE inhibitor .  Hx of DVT and PE  - Pt was diagnosed with DVT and b/l PE in February 2018  - She has been tolerating Eliquis well and there is no suggestion of acute VTE  - In anticipation of likely I&D, Eliquis is held on admission and she has been started on heparin infusion   Chronic diastolic CHF   - Pt appears dry on admission in setting of acute infection  - TTE (09/04/16) with EF 65-78%, grade 1 diastolic dysfunction, no WMA, and no significant valvular disease  - She is managed at home with Lopressor, lisinopril, Lasix, and Aldactone  - Lopressor is continued;  lisinopril, and Aldactone held on admission in light of AKI , giving improving renal function, has been resumed on Lasix   Hypertension - Blood pressure is acceptable, continue with metoprolol, continue to hold Lasix lisinopril and Aldactone   finding of thyroid nodule/adrenal adenoma in imaging - Patient reported this has been worked up by her new PCP, ultrasound done recently in outpatient setting   Code Status : Full  Family Communication  : None at bedside  Disposition Plan  : Home when stable  Consults  :  Gen surgery, renal  Procedures  : None  DVT Prophylaxis  :   Heparin GTT  Lab Results  Component Value Date   PLT 215 09/27/2016    Antibiotics  :   Anti-infectives    Start     Dose/Rate Route Frequency Ordered Stop   09/24/16 1000  [MAR Hold]  doxycycline (VIBRAMYCIN) 100 mg in dextrose 5 % 250 mL IVPB     (MAR Hold since 09/27/16 1223)   100 mg 125 mL/hr over 120 Minutes Intravenous Every 12 hours 09/24/16 0744     09/24/16 1000  [MAR Hold]  cefTRIAXone (ROCEPHIN) 1 g in dextrose 5 % 50 mL IVPB     (MAR Hold since 09/27/16 1223)   1 g 100 mL/hr over 30 Minutes Intravenous Every 24 hours 09/24/16 0926     09/23/16 1000  vancomycin (VANCOCIN) 1,500 mg in sodium chloride 0.9 % 500 mL IVPB  Status:  Discontinued     1,500 mg 250 mL/hr over 120 Minutes Intravenous Every 24 hours 09/22/16 1955 09/24/16 0735   09/22/16 2000  vancomycin (VANCOCIN) IVPB 1000 mg/200 mL premix  Status:  Discontinued     1,000 mg 200 mL/hr over 60 Minutes Intravenous  Once 09/22/16 1950 09/22/16 1953   09/22/16 2000  vancomycin (VANCOCIN) 2,000 mg in sodium chloride 0.9 % 500 mL IVPB     2,000 mg 250 mL/hr over 120 Minutes Intravenous  Once 09/22/16 1955 09/22/16 2222   09/22/16 1245  clindamycin (CLEOCIN) IVPB 600 mg     600 mg 100 mL/hr over 30 Minutes Intravenous  Once 09/22/16 1231 09/22/16 1634        Objective:   Vitals:   09/26/16 1422 09/26/16 2059 09/27/16 0303 09/27/16 0612  BP: 101/62 (!) 116/59 112/67   Pulse: 66 79 74   Resp: 16 18 18    Temp: 98.5 F (36.9 C) 98.5 F (36.9 C) 98 F (36.7 C)  TempSrc: Oral Oral Oral   SpO2: 100% 98% 99%   Weight:    124.8 kg (275 lb 3.2 oz)  Height:        Wt Readings from Last 3 Encounters:  09/27/16 124.8 kg (275 lb 3.2 oz)  09/06/16 124.6 kg (274 lb 9.6 oz)  01/21/16 119.7 kg (264 lb)     Intake/Output Summary (Last 24 hours) at 09/27/16 1340 Last data filed at 09/27/16 1010  Gross per 24 hour  Intake           756.07 ml  Output             1550 ml  Net          -793.93 ml     Physical  Exam  Awake Alert, Oriented X 3 Supple Neck,No JVD,   Symmetrical Chest wall movement, Good air movement bilaterally, CTAB RRR,No Gallops,Rubs or new Murmurs, No Parasternal Heave +ve B.Sounds, Abd Soft, No rebound - guarding or rigidity, Cellulitis Currently improved on the left side of the abdomen, remains with tender indurated erythematous area in the right pannus .Marland Kitchen No Cyanosis, Clubbing , No new Rash or bruise, +2 right lower extremity edema, +1 left lower extremity edema    Data Review:    CBC  Recent Labs Lab 09/22/16 1100 09/23/16 0259 09/24/16 0540 09/25/16 0619 09/26/16 0537 09/27/16 0553  WBC 12.6* 12.8* 12.3* 13.7* 14.5* 13.8*  HGB 11.3* 9.9* 9.6* 10.9* 9.2* 9.4*  HCT 33.4* 28.6* 28.3* 32.5* 26.5* 27.7*  PLT 156 155 155 198 190 215  MCV 88.8 88.0 86.3 87.6 87.5 86.0  MCH 30.1 30.5 29.3 29.4 30.4 29.2  MCHC 33.8 34.6 33.9 33.5 34.7 33.9  RDW 13.6 14.0 13.7 13.8 14.0 13.8  LYMPHSABS 0.6*  --   --   --   --   --   MONOABS 1.1*  --   --   --   --   --   EOSABS 0.0  --   --   --   --   --   BASOSABS 0.0  --   --   --   --   --     Chemistries   Recent Labs Lab 09/22/16 1100 09/23/16 0547 09/24/16 0540 09/24/16 1150 09/25/16 0619 09/26/16 0537 09/27/16 0553  NA 137 137  --  136 138  --  139  K 3.6 3.5  --  3.5 3.7  --  3.7  CL 101 104  --  105 103  --  104  CO2 27 24  --  23 24  --  25  GLUCOSE 113* 114*  --  143* 109*  --  99  BUN 25* 27*  --  25* 19  --  14  CREATININE 2.06* 2.12* 2.41* 2.29* 1.98*  2.07* 1.70* 1.65*  CALCIUM 9.1 8.5*  --  8.7* 9.2  --  9.1  AST  --   --   --  20  --   --   --   ALT  --   --   --  29  --   --   --   ALKPHOS  --   --   --  49  --   --   --   BILITOT  --   --   --  0.6  --   --   --    ------------------------------------------------------------------------------------------------------------------ No results for input(s): CHOL, HDL, LDLCALC, TRIG, CHOLHDL, LDLDIRECT in the last 72 hours.  Lab  Results  Component  Value Date   HGBA1C 5.0 01/06/2016   ------------------------------------------------------------------------------------------------------------------ No results for input(s): TSH, T4TOTAL, T3FREE, THYROIDAB in the last 72 hours.  Invalid input(s): FREET3 ------------------------------------------------------------------------------------------------------------------ No results for input(s): VITAMINB12, FOLATE, FERRITIN, TIBC, IRON, RETICCTPCT in the last 72 hours.  Coagulation profile No results for input(s): INR, PROTIME in the last 168 hours.  No results for input(s): DDIMER in the last 72 hours.  Cardiac Enzymes No results for input(s): CKMB, TROPONINI, MYOGLOBIN in the last 168 hours.  Invalid input(s): CK ------------------------------------------------------------------------------------------------------------------    Component Value Date/Time   BNP 51.8 09/03/2016 2052    Inpatient Medications  Scheduled Meds: . [MAR Hold] bisacodyl  5 mg Oral Once  . [MAR Hold] cefTRIAXone (ROCEPHIN)  IV  1 g Intravenous Q24H  . [MAR Hold] diphenhydrAMINE  50 mg Oral Once  . [MAR Hold] doxycycline (VIBRAMYCIN) IV  100 mg Intravenous Q12H  . [MAR Hold] furosemide  40 mg Oral Daily  . [MAR Hold] metoprolol tartrate  12.5 mg Oral BID  . [MAR Hold] polyethylene glycol  34 g Oral Once  . senna-docusate  2 tablet Oral BID   Continuous Infusions: . heparin Stopped (09/27/16 0805)  . lactated ringers 75 mL/hr at 09/27/16 1245   PRN Meds:.[MAR Hold] acetaminophen **OR** [MAR Hold] acetaminophen, [MAR Hold] bisacodyl, [MAR Hold] diphenhydrAMINE, [MAR Hold] HYDROcodone-acetaminophen, [MAR Hold] ondansetron **OR** [MAR Hold] ondansetron (ZOFRAN) IV, [MAR Hold] polyethylene glycol  Micro Results Recent Results (from the past 240 hour(s))  Culture, Urine     Status: None   Collection Time: 09/24/16  9:40 AM  Result Value Ref Range Status   Specimen Description URINE, RANDOM  Final    Special Requests NONE  Final   Culture   Final    NO GROWTH Performed at [facility], [place] [address], [place], [place] [address]    Report Status 09/25/2016 FINAL  Final  MRSA PCR Screening     Status: Abnormal   Collection Time: 09/27/16 10:28 AM  Result Value Ref Range Status   MRSA by PCR INVALID RESULTS, SPECIMEN SENT FOR CULTURE (A) NEGATIVE Final    Comment: RESULT CALLED TO, READ BACK BY AND VERIFIED WITH: PENNIX,A @ 1217 ON [id number] BY POTEAT,S        The GeneXpert MRSA Assay (FDA approved for NASAL specimens only), is one component of a comprehensive MRSA colonization surveillance program. It is not intended to diagnose MRSA infection nor to guide or monitor treatment for MRSA infections.     Radiology Reports Dg Chest 2 View  Result Date: 09/03/2016 CLINICAL DATA:  Shortness of breath 1 month with cough and chest pain as well as wheezing. EXAM: CHEST  2 VIEW COMPARISON:  None. FINDINGS: Lungs are adequately inflated without focal consolidation or effusion. Cardiomediastinal silhouette is within normal. Remaining bones and soft tissues are within normal. IMPRESSION: No active cardiopulmonary disease. Electronically Signed   By: Marin Olp M.D.   On: 09/03/2016 21:33   Ct Angio Chest Pe W And/or Wo Contrast  Result Date: 09/03/2016 CLINICAL DATA:  Acute onset of shortness of breath with exertion. Bilateral lower extremity swelling. Initial encounter. EXAM: CT ANGIOGRAPHY CHEST WITH CONTRAST TECHNIQUE: Multidetector CT imaging of the chest was performed using the standard protocol during bolus administration of intravenous contrast. Multiplanar CT image reconstructions and MIPs were obtained to evaluate the vascular anatomy. CONTRAST:  80 mL of Isovue 370 IV contrast COMPARISON:  Chest radiograph performed earlier today at 9:20 p.m. FINDINGS: Cardiovascular:  There is pulmonary embolus within the right main pulmonary artery, extending into all lobes of the right lung.  There is also segmental pulmonary embolus within a pulmonary artery to the left lower lobe. The RV/LV ratio is just above 0.9, corresponding to mild right heart strain and at least submassive pulmonary embolus. The heart is otherwise unremarkable in appearance. The thoracic aorta is unremarkable. No calcific atherosclerotic disease is seen. The vessels are grossly unremarkable. Mediastinum/Nodes: The mediastinum is otherwise unremarkable in appearance. No mediastinal lymphadenopathy is seen. No pericardial effusion is identified. A 2.4 cm exophytic nodule is seen arising at the inferior aspect of the left thyroid lobe. No axillary lymphadenopathy is seen. Lungs/Pleura: Minimal bibasilar atelectasis is noted. No pleural effusion or pneumothorax is seen. No masses are identified. Upper Abdomen: The visualized portions of the liver and spleen are grossly unremarkable. The visualized portions of the gallbladder, pancreas, right and kidneys are grossly unremarkable. A 2.2 cm left adrenal adenoma is noted. Nonspecific left-sided perinephric stranding is seen. Musculoskeletal: No acute osseous abnormalities are identified. The visualized musculature is unremarkable in appearance. Review of the MIP images confirms the above findings. IMPRESSION: 1. Pulmonary embolus within the right main pulmonary artery, extending into all lobes of the right lung. Segmental pulmonary embolus within the pulmonary artery to the left lower lobe. CT evidence of right heart strain (RV/LV Ratio = 0.91) consistent with at least submassive (intermediate risk) PE. The presence of right heart strain has been associated with an increased risk of morbidity and mortality. Please activate Code PE by paging [phone]. 2. Minimal bibasilar atelectasis noted.  Lungs otherwise clear. 3. **An incidental finding of potential clinical significance has been found. 2.4 cm exophytic nodule arising at the inferior aspect of the left thyroid lobe. Recommend  further evaluation with thyroid ultrasound. If patient is clinically hyperthyroid, consider nuclear medicine thyroid uptake and scan.** 4. 2.2 cm left adrenal adenoma noted. Critical Value/emergent results were called by telephone at the time of interpretation on 09/03/2016 at 11:22 pm to Dr. [name], who verbally acknowledged these results. Electronically Signed   By: Garald Balding M.D.   On: 09/03/2016 23:30   Ct Pelvis Wo Contrast  Result Date: 09/22/2016 CLINICAL DATA:  Red, warm, extremely painful region lower anterior abdominal wall EXAM: CT PELVIS WITHOUT CONTRAST TECHNIQUE: Multidetector CT imaging of the pelvis was performed following the standard protocol without intravenous contrast. COMPARISON:  None. FINDINGS: Urinary Tract:  Unremarkable. Bowel:  Unremarkable. Vascular/Lymphatic: Unremarkable. Reproductive:  Unremarkable. Other:  No free fluid. Musculoskeletal: 4.1 x 5.0 cm irregular soft tissue attenuating lesion is identified in the lower right pannus with surrounding edema/ inflammation and overlying skin thickening. Lesion not fully evaluated given the lack of intravenous contrast material. Bone windows reveal no worrisome lytic or sclerotic osseous lesions. Patient is status post lower lumbar fusion. IMPRESSION: 1. 4.1 x 5.0 cm irregular soft tissue attenuating lesion in the subcutaneous fat of the lower right pannus. Given clinical history and the overlying skin thickening and adjacent edema/inflammation, this is probably phlegmon/evolving abscess. Subcutaneous hematoma or subcutaneous metastatic lesion cannot be excluded by imaging alone. Electronically Signed   By: Misty Stanley M.D.   On: 09/22/2016 12:23   US Renal  Result Date: 09/24/2016 CLINICAL DATA:  Acute renal failure EXAM: RENAL / URINARY TRACT ULTRASOUND COMPLETE COMPARISON:  Ultrasound 08/04/2016 FINDINGS: Right Kidney: Length: 11.6 cm. Echogenicity within normal limits. No mass or hydronephrosis visualized. Left  Kidney: Length: 12.0 cm. Echogenicity within normal limits. No mass or  hydronephrosis visualized. Bladder: Appears normal for degree of bladder distention. IMPRESSION: Normal renal ultrasound. Electronically Signed   By: Suzy Bouchard M.D.   On: 09/24/2016 20:34   US Thyroid  Result Date: 09/21/2016 CLINICAL DATA:  Incidental on CT.  Left thyroid nodule noted on CT EXAM: THYROID ULTRASOUND TECHNIQUE: Ultrasound examination of the thyroid gland and adjacent soft tissues was performed. COMPARISON:  None. FINDINGS: Parenchymal Echotexture: Normal Isthmus: 0.4 cm Right lobe: 5.3 x 1.7 x 2.1 cm Left lobe: 5.2 x 1.4 x 2.0 cm _________________________________________________________ Estimated total number of nodules >/= 1 cm: 0 Number of spongiform nodules >/=  2 cm not described below (TR1): 0 Number of mixed cystic and solid nodules >/= 1.5 cm not described below (TR2): 0 _________________________________________________________ Small nodules and cysts measuring 0.5 cm or less are scattered throughout the left lobe and have a benign appearance. IMPRESSION: Tiny nodules and cysts in left lobe were present. None meet criteria for fine needle aspiration biopsy or annual follow-up. The above is in keeping with the ACR TI-RADS recommendations - J Am Coll Radiol 2017;14:587-595. Electronically Signed   By: Marybelle Killings M.D.   On: 09/21/2016 15:28    ELGERGAWY, DAWOOD M.D on 09/27/2016 at 1:40 PM  Between 7am to 7pm - Pager - [phone]  After 7pm go to [email or web address] - password [email or web address]  [facility] -  Office  [phone]

## 2016-09-27 NOTE — Progress Notes (Signed)
Can resume heparin gtt tonight at 10 pm   Leighton Ruff. Redmond Pulling, MD, FACS General, Bariatric, & Minimally Invasive Surgery [facility], [place]

## 2016-09-27 NOTE — Progress Notes (Signed)
Subjective: Still uncomfortable in RLQ pannus  Objective: Vital signs in last 24 hours: Temp:  [98 F (36.7 C)-98.5 F (36.9 C)] 98 F (36.7 C) (03/19 0303) Pulse Rate:  [66-79] 74 (03/19 0303) Resp:  [16-18] 18 (03/19 0303) BP: (101-116)/(43-67) 112/67 (03/19 0303) SpO2:  [98 %-100 %] 99 % (03/19 0303) Weight:  [124.8 kg (275 lb 3.2 oz)] 124.8 kg (275 lb 3.2 oz) (03/19 0612) Last BM Date: 09/26/16  Intake/Output from previous day: 03/18 0701 - 03/19 0700 In: 1536.1 [P.O.:720; I.V.:266.1; IV Piggyback:550] Out: 7494 [Urine:1550] Intake/Output this shift: No intake/output data recorded.  Alert, resting comfortably Moderate pannus - RLQ pannus with extensive cellulitis with focal area of induration of about 20 cm x 15 cm; TTP in this area  Lab Results:   Recent Labs  09/26/16 0537 09/27/16 0553  WBC 14.5* 13.8*  HGB 9.2* 9.4*  HCT 26.5* 27.7*  PLT 190 215   BMET  Recent Labs  09/25/16 0619 09/26/16 0537 09/27/16 0553  NA 138  --  139  K 3.7  --  3.7  CL 103  --  104  CO2 24  --  25  GLUCOSE 109*  --  99  BUN 19  --  14  CREATININE 1.98*  2.07* 1.70* 1.65*  CALCIUM 9.2  --  9.1   PT/INR No results for input(s): LABPROT, INR in the last 72 hours. ABG No results for input(s): PHART, HCO3 in the last 72 hours.  Invalid input(s): PCO2, PO2  Studies/Results: No results found.  Anti-infectives: Anti-infectives    Start     Dose/Rate Route Frequency Ordered Stop   09/24/16 1000  doxycycline (VIBRAMYCIN) 100 mg in dextrose 5 % 250 mL IVPB     100 mg 125 mL/hr over 120 Minutes Intravenous Every 12 hours 09/24/16 0744     09/24/16 1000  cefTRIAXone (ROCEPHIN) 1 g in dextrose 5 % 50 mL IVPB     1 g 100 mL/hr over 30 Minutes Intravenous Every 24 hours 09/24/16 0926     09/23/16 1000  vancomycin (VANCOCIN) 1,500 mg in sodium chloride 0.9 % 500 mL IVPB  Status:  Discontinued     1,500 mg 250 mL/hr over 120 Minutes Intravenous Every 24 hours 09/22/16 1955  09/24/16 0735   09/22/16 2000  vancomycin (VANCOCIN) IVPB 1000 mg/200 mL premix  Status:  Discontinued     1,000 mg 200 mL/hr over 60 Minutes Intravenous  Once 09/22/16 1950 09/22/16 1953   09/22/16 2000  vancomycin (VANCOCIN) 2,000 mg in sodium chloride 0.9 % 500 mL IVPB     2,000 mg 250 mL/hr over 120 Minutes Intravenous  Once 09/22/16 1955 09/22/16 2222   09/22/16 1245  clindamycin (CLEOCIN) IVPB 600 mg     600 mg 100 mL/hr over 30 Minutes Intravenous  Once 09/22/16 1231 09/22/16 1634      Assessment/Plan: Morbid obesity Panniculitis with induration AKI Recent PE on hep gtt  I agree with Dr Ninfa Linden with ongoing TTP, cellulitis and now more focal area of induration that she needs I&D&excision of this area to expedite resolution of infection  Discussed risk/benefits with pt - bleeding, ongoing infection, dvt/pe, need for addl infection, anesthesia complications, delayed wound healing, scarring, injury to surrounding structures, and the typical postop course.   Pt has agreed to proceed with surgery  Stop hep gtt now  Will need to wait a few hours for hep to wear off Npo x meds  Leighton Ruff. Redmond Pulling, MD, FACS General, Bariatric, &  Minimally Invasive Surgery [facility] [place]   LOS: 5 days    Gayland Curry 09/27/2016

## 2016-09-27 NOTE — Brief Op Note (Signed)
09/22/2016 - 09/27/2016  2:22 PM  PATIENT:  Caitlin Taylor  46 y.o. female  PRE-OPERATIVE DIAGNOSIS: RIGHT LOWER QUADRANT ABDOMINAL WALL ABSCESS; PANNICULITIS  POST-OPERATIVE DIAGNOSIS:  SAME  PROCEDURE:  Procedure(s): INCISION AND DEBRIDEMENT OF ABDOMINAL WALL ABSCESS (N/A)  SURGEON:  Surgeon(s) and Role:    * Greer Pickerel, MD - Primary  PHYSICIAN ASSISTANT: Janace Hoard, PA-S  ASSISTANTS: see above   ANESTHESIA:   general  EBL:  No intake/output data recorded.  BLOOD ADMINISTERED:none  DRAINS: Penrose drain in the abscess cavity   LOCAL MEDICATIONS USED:  NONE  SPECIMEN:  Source of Specimen:  aerobic/anerobic  DISPOSITION OF SPECIMEN:  PATHOLOGY   Tool used for debridement (curette, scapel, etc.)   scapel    Frequency of surgical debridement.   initial   Area and depth of devitalized tissue removed from wound.  3 x 9cm    Blood loss and description of tissue removed.  Skin and soft tissue   Was there any viable tissue removed (measurements): none  COUNTS:  YES  TOURNIQUET:  * No tourniquets in log *  DICTATION: .Other Dictation: Dictation Number [id number]  PLAN OF CARE: pacu then floor  PATIENT DISPOSITION:  PACU - hemodynamically stable.   Delay start of Pharmacological VTE agent (>24hrs) due to surgical blood loss or risk of bleeding: no  Leighton Ruff. Redmond Pulling, MD, FACS General, Bariatric, & Minimally Invasive Surgery [facility], [place]

## 2016-09-27 NOTE — Progress Notes (Signed)
[place] for IV heparin Indication: recent PE/DVT, on apixaban prior to admission.  No Known Allergies  Patient Measurements: Height: 5\' 3"  (160 cm) Weight: 275 lb 3.2 oz (124.8 kg) IBW/kg (Calculated) : 52.4 Heparin Dosing Weight: 82.7 kg  Vital Signs: Temp: 98.3 F (36.8 C) (03/19 1425) BP: 137/83 (03/19 1500) Pulse Rate: 67 (03/19 1509)  Labs:  Recent Labs  09/25/16 0619 09/25/16 1144 09/26/16 0537 09/27/16 0553  HGB 10.9*  --  9.2* 9.4*  HCT 32.5*  --  26.5* 27.7*  PLT 198  --  190 215  APTT 67* 77* 95* 72*  HEPARINUNFRC 1.16* 1.03* 0.83* 0.56  CREATININE 1.98*  2.07*  --  1.70* 1.65*    Estimated Creatinine Clearance: 55.3 mL/min (A) (by C-G formula based on SCr of 1.65 mg/dL (H)).   Medical History: Past Medical History:  Diagnosis Date  . Chronic back pain   . CKD (chronic kidney disease) stage 2, GFR 60-89 ml/min   . DVT (deep venous thrombosis) ([place])   . Hypertension   . Lumbar stenosis   . Pulmonary embolism ([place])    a. diagnosed in 08/2016. Started on Eliquis    Assessment: 64 yoF with PMH HTN, recent bilateral PE with DVT diagnosed 09/03/16 and currently on Eliquis, presents to [facility] with abdominal wall cellulitis with possible abscess. Transferred to [facility] with Surgery to evaluate for I&D. Converting Eliquis to heparin per pharmacy in anticipation of possible surgery.   Baseline heparin level> 2.2  Prior anticoagulation: Eliquis 5 mg bid, last dose 3/13 at 2000  AKI noted on admission with SCr 2.06 (baseline ~1.3)  Significant events:  Today, 09/27/2016:  Heparin level/aPTT therapeutic on heparin 1300 units/hr this morning  CBC: hgb low but stable, Plt wnl  No bleeding or infusion issues per RN-->off at 0800 for OR  Renal function improving  Goal of Therapy: Heparin level 0.3-0.7 units/ml Monitor platelets by anticoagulation protocol: Yes  Plan:  Resume heparin infusion 1300 units/hr at  2200  Check 6-8h heparin level after heparin resumed (with morning labs)  Daily Heparin level & CBC   D/C aPTT monitoring  Monitor for signs of bleeding or thrombosis  F/U plans to resume Eliquis once no further interventions planned  Netta Cedars, PharmD, BCPS Pager: [phone] 09/27/2016, 3:17 PM

## 2016-09-27 NOTE — Anesthesia Preprocedure Evaluation (Addendum)
Anesthesia Evaluation  Patient identified by MRN, date of birth, ID band Patient awake    Reviewed: Allergy & Precautions, NPO status , Patient's Chart, lab work & pertinent test results  History of Anesthesia Complications Negative for: history of anesthetic complications  Airway Mallampati: I  TM Distance: >3 FB Neck ROM: Full    Dental  (+) Dental Advisory Given, Missing   Pulmonary PE   breath sounds clear to auscultation       Cardiovascular hypertension, Pt. on medications and Pt. on home beta blockers (-) angina+ DVT   Rhythm:Regular Rate:Normal  2/18 ECHO: EF 65-70%, valves OK   Neuro/Psych Anxiety negative neurological ROS     GI/Hepatic negative GI ROS, Neg liver ROS,   Endo/Other  Morbid obesity  Renal/GU Renal InsufficiencyRenal disease (creat 1.65)     Musculoskeletal   Abdominal (+) + obese,   Peds  Hematology  (+) Blood dyscrasia (eliquis), anemia , Hb 9.4   Anesthesia Other Findings   Reproductive/Obstetrics                            Anesthesia Physical Anesthesia Plan  ASA: III  Anesthesia Plan: General   Post-op Pain Management:    Induction: Intravenous  Airway Management Planned: Oral ETT  Additional Equipment:   Intra-op Plan:   Post-operative Plan: Extubation in OR  Informed Consent: I have reviewed the patients History and Physical, chart, labs and discussed the procedure including the risks, benefits and alternatives for the proposed anesthesia with the patient or authorized representative who has indicated his/her understanding and acceptance.   Dental advisory given  Plan Discussed with: CRNA and Surgeon  Anesthesia Plan Comments: (Plan routine monitors, GETA)        Anesthesia Quick Evaluation

## 2016-09-28 LAB — PROTEIN ELECTROPHORESIS, SERUM
A/G RATIO SPE: 0.9 (ref 0.7–1.7)
Albumin ELP: 2.9 g/dL (ref 2.9–4.4)
Alpha-1-Globulin: 0.4 g/dL (ref 0.0–0.4)
Alpha-2-Globulin: 1.2 g/dL — ABNORMAL HIGH (ref 0.4–1.0)
Beta Globulin: 0.9 g/dL (ref 0.7–1.3)
GLOBULIN, TOTAL: 3.1 g/dL (ref 2.2–3.9)
Gamma Globulin: 0.7 g/dL (ref 0.4–1.8)
TOTAL PROTEIN ELP: 6 g/dL (ref 6.0–8.5)

## 2016-09-28 LAB — CBC
HCT: 26.2 % — ABNORMAL LOW (ref 36.0–46.0)
HEMOGLOBIN: 9 g/dL — AB (ref 12.0–15.0)
MCH: 29.8 pg (ref 26.0–34.0)
MCHC: 34.4 g/dL (ref 30.0–36.0)
MCV: 86.8 fL (ref 78.0–100.0)
PLATELETS: 153 10*3/uL (ref 150–400)
RBC: 3.02 MIL/uL — AB (ref 3.87–5.11)
RDW: 14.1 % (ref 11.5–15.5)
WBC: 11.7 10*3/uL — AB (ref 4.0–10.5)

## 2016-09-28 LAB — RENAL FUNCTION PANEL
Albumin: 2.8 g/dL — ABNORMAL LOW (ref 3.5–5.0)
Anion gap: 8 (ref 5–15)
BUN: 10 mg/dL (ref 6–20)
CHLORIDE: 105 mmol/L (ref 101–111)
CO2: 26 mmol/L (ref 22–32)
CREATININE: 1.51 mg/dL — AB (ref 0.44–1.00)
Calcium: 9 mg/dL (ref 8.9–10.3)
GFR calc Af Amer: 47 mL/min — ABNORMAL LOW (ref >60)
GFR, EST NON AFRICAN AMERICAN: 41 mL/min — AB (ref >60)
Glucose, Bld: 97 mg/dL (ref 65–99)
PHOSPHORUS: 3.4 mg/dL (ref 2.5–4.6)
Potassium: 3.6 mmol/L (ref 3.5–5.1)
Sodium: 139 mmol/L (ref 135–145)

## 2016-09-28 LAB — C4 COMPLEMENT: Complement C4, Body Fluid: 35 mg/dL (ref 14–44)

## 2016-09-28 LAB — MRSA CULTURE: Culture: NOT DETECTED

## 2016-09-28 LAB — GLOMERULAR BASEMENT MEMBRANE ANTIBODIES: GBM Ab: 5 units (ref 0–20)

## 2016-09-28 LAB — GLUCOSE, CAPILLARY: GLUCOSE-CAPILLARY: 87 mg/dL (ref 65–99)

## 2016-09-28 LAB — MPO/PR-3 (ANCA) ANTIBODIES
ANCA Proteinase 3: <3.5 U/mL (ref 0.0–3.5)
Myeloperoxidase Abs: <9 U/mL (ref 0.0–9.0)

## 2016-09-28 LAB — ANTINUCLEAR ANTIBODIES, IFA: ANA Ab, IFA: NEGATIVE

## 2016-09-28 LAB — ANTI-DNA ANTIBODY, DOUBLE-STRANDED

## 2016-09-28 LAB — ANTISTREPTOLYSIN O TITER: ASO: 52 [IU]/mL (ref 0.0–200.0)

## 2016-09-28 LAB — HEPARIN LEVEL (UNFRACTIONATED): HEPARIN UNFRACTIONATED: 0.42 [IU]/mL (ref 0.30–0.70)

## 2016-09-28 LAB — C3 COMPLEMENT: C3 Complement: 178 mg/dL — ABNORMAL HIGH (ref 82–167)

## 2016-09-28 LAB — COMPLEMENT, TOTAL: Compl, Total (CH50): >60 U/mL — ABNORMAL HIGH (ref 42–60)

## 2016-09-28 NOTE — Progress Notes (Signed)
PROGRESS NOTE                                                                                                                                                                                                             Patient Demographics:    Caitlin Taylor, is a 46 y.o. female, DOB - [birth year], SEG:[id number]  Admit date - 09/22/2016   Admitting Physician [name], MD  Outpatient Primary MD for the patient is [name], MD  LOS - 6   Chief Complaint  Patient presents with  . Abdominal Pain     Brief Narrative   46 y.o. female with medical history significant for hypertension and recent diagnosis of DVT and PE and now managed with Eliquis, she was admitted with abdominal wall cellulitis, imaging with possible abscess versus phlegmon, followed by surgery, patient with creatinine of 2 on admission, Nephrotoxic medication has been held, followed by renal for acute renal failure, renal  function improving slowly, she  for I&D by surgery on 3/19   Subjective:    Caitlin Taylor today has, No headache, No chest pain,No Nausea, No new weakness tingling or numbness, No Cough - SOB.Reports her abdominal pain is controlled.   Assessment  & Plan :    Principal Problem:   Abdominal wall cellulitis Active Problems:   Normocytic anemia   Morbid obesity due to excess calories (HCC)   CKD (chronic kidney disease), stage III   Benign essential HTN   History of pulmonary embolus (PE)   AKI (acute kidney injury) ([place])   Chronic diastolic CHF (congestive heart failure) (HCC)   Cellulitis   Abdominal wall cellulitis with possible abscess  - Pt presents with 1 wk of worsening tenderness in lower abd and a couple days of chills of malaise , mild leukocytosis and AKI on presentation, but no fever or tachycardia; CT findings suggest a developing abscess  - Gen surgery is consulting and much appreciated; holding Eliquis, on  heparin infusion for I&D,   Infection  appears to be more focal, with area of induration, so she will need I&D by Dr. Redmond Pulling on 3/19 - IV vancomycin on hold in the setting of acute kidney injury, transitioned to IV doxycycline and Rocephin, Gram stain significant for gram-positive cocci, gram-positive rods, gram-negative rods, so will continue with current regimen. - Wound care per surgical  team  Acute kidney injury superimposed on CKD stage III  - SCr is 2.06 on admission, baseline 1.3-1.5, peaked at  2.41, currently improving, creatinine of 1.5 today, continue to monitor closely as on Lasix. - Continue to Hold lisinopril(at least 4 weeks before discharge), Aldactone, and HCTZ - Renal consultation appreciated, most likely in the setting of ATN due to infection/volume depletion/ACE inhibitor . - Renal signed off  Hx of DVT and PE  - Pt was diagnosed with DVT and b/l PE in February 2018  - Eliquis  on hold, she is on heparin GTT, continue to monitor for bleed. - Will need sleep study as an outpatient  Chronic diastolic CHF   - TTE (01/21/44) with EF 80-99%, grade 1 diastolic dysfunction, no WMA, and no significant valvular disease  - She is managed at home with Lopressor, lisinopril, Lasix, and Aldactone  - Lopressor is continued;  lisinopril, and Aldactone held on admission in light of AKI , givien improving renal function, has been resumed on Lasix  Hypertension - Blood pressure is acceptable, continue with metoprolol, continue to hold Lasix lisinopril and Aldactone   finding of thyroid nodule/adrenal adenoma in imaging - Patient reported this has been worked up by her new PCP, thyroid ultrasound done recently in outpatient setting   Code Status : Full  Family Communication  : None at bedside  Disposition Plan  : Home when stable  Consults  :  Gen surgery, renal  Procedures  : I& D of abdominal wall abscess by Dr. Redmond Pulling on 3/19  DVT Prophylaxis  :  Heparin GTT  Lab Results  Component Value Date    PLT 153 09/28/2016    Antibiotics  :   Anti-infectives    Start     Dose/Rate Route Frequency Ordered Stop   09/24/16 1000  doxycycline (VIBRAMYCIN) 100 mg in dextrose 5 % 250 mL IVPB     100 mg 125 mL/hr over 120 Minutes Intravenous Every 12 hours 09/24/16 0744     09/24/16 1000  cefTRIAXone (ROCEPHIN) 1 g in dextrose 5 % 50 mL IVPB     1 g 100 mL/hr over 30 Minutes Intravenous Every 24 hours 09/24/16 0926     09/23/16 1000  vancomycin (VANCOCIN) 1,500 mg in sodium chloride 0.9 % 500 mL IVPB  Status:  Discontinued     1,500 mg 250 mL/hr over 120 Minutes Intravenous Every 24 hours 09/22/16 1955 09/24/16 0735   09/22/16 2000  vancomycin (VANCOCIN) IVPB 1000 mg/200 mL premix  Status:  Discontinued     1,000 mg 200 mL/hr over 60 Minutes Intravenous  Once 09/22/16 1950 09/22/16 1953   09/22/16 2000  vancomycin (VANCOCIN) 2,000 mg in sodium chloride 0.9 % 500 mL IVPB     2,000 mg 250 mL/hr over 120 Minutes Intravenous  Once 09/22/16 1955 09/22/16 2222   09/22/16 1245  clindamycin (CLEOCIN) IVPB 600 mg     600 mg 100 mL/hr over 30 Minutes Intravenous  Once 09/22/16 1231 09/22/16 1634        Objective:   Vitals:   09/27/16 1845 09/27/16 2219 09/28/16 0601 09/28/16 1030  BP: 136/71 (!) 128/52 140/84 128/80  Pulse: 94 80 78 72  Resp: 18 18 18    Temp: 98 F (36.7 C) 98.3 F (36.8 C) 98.7 F (37.1 C)   TempSrc: Oral Oral Oral   SpO2: 97% 97% 95%   Weight:      Height:        Wt Readings from  Last 3 Encounters:  09/27/16 124.8 kg (275 lb 3.2 oz)  09/06/16 124.6 kg (274 lb 9.6 oz)  01/21/16 119.7 kg (264 lb)     Intake/Output Summary (Last 24 hours) at 09/28/16 1248 Last data filed at 09/28/16 1000  Gross per 24 hour  Intake           3376.7 ml  Output              950 ml  Net           2426.7 ml     Physical Exam  Awake Alert, Oriented X 3 Supple Neck,No JVD,   Symmetrical Chest wall movement, Good air movement bilaterally, CTAB RRR,No Gallops,Rubs or new  Murmurs, No Parasternal Heave +ve B.Sounds, Abd Soft, No rebound - guarding or rigidity, dressing in right lower quadrant with serosanguinous drainage No Cyanosis, Clubbing , No new Rash or bruise, +2 right lower extremity edema, +1 left lower extremity edema    Data Review:    CBC  Recent Labs Lab 09/22/16 1100  09/24/16 0540 09/25/16 0619 09/26/16 0537 09/27/16 0553 09/28/16 0534  WBC 12.6*  < > 12.3* 13.7* 14.5* 13.8* 11.7*  HGB 11.3*  < > 9.6* 10.9* 9.2* 9.4* 9.0*  HCT 33.4*  < > 28.3* 32.5* 26.5* 27.7* 26.2*  PLT 156  < > 155 198 190 215 153  MCV 88.8  < > 86.3 87.6 87.5 86.0 86.8  MCH 30.1  < > 29.3 29.4 30.4 29.2 29.8  MCHC 33.8  < > 33.9 33.5 34.7 33.9 34.4  RDW 13.6  < > 13.7 13.8 14.0 13.8 14.1  LYMPHSABS 0.6*  --   --   --   --   --   --   MONOABS 1.1*  --   --   --   --   --   --   EOSABS 0.0  --   --   --   --   --   --   BASOSABS 0.0  --   --   --   --   --   --   < > = values in this interval not displayed.  Chemistries   Recent Labs Lab 09/23/16 0547  09/24/16 1150 09/25/16 0619 09/26/16 0537 09/27/16 0553 09/28/16 0534  NA 137  --  136 138  --  139 139  K 3.5  --  3.5 3.7  --  3.7 3.6  CL 104  --  105 103  --  104 105  CO2 24  --  23 24  --  25 26  GLUCOSE 114*  --  143* 109*  --  99 97  BUN 27*  --  25* 19  --  14 10  CREATININE 2.12*  < > 2.29* 1.98*  2.07* 1.70* 1.65* 1.51*  CALCIUM 8.5*  --  8.7* 9.2  --  9.1 9.0  AST  --   --  20  --   --   --   --   ALT  --   --  29  --   --   --   --   ALKPHOS  --   --  49  --   --   --   --   BILITOT  --   --  0.6  --   --   --   --   < > = values in this interval not displayed. ------------------------------------------------------------------------------------------------------------------ No results for input(s): CHOL, HDL, LDLCALC, TRIG,  CHOLHDL, LDLDIRECT in the last 72 hours.  Lab Results  Component Value Date   HGBA1C 5.0 01/06/2016    ------------------------------------------------------------------------------------------------------------------ No results for input(s): TSH, T4TOTAL, T3FREE, THYROIDAB in the last 72 hours.  Invalid input(s): FREET3 ------------------------------------------------------------------------------------------------------------------ No results for input(s): VITAMINB12, FOLATE, FERRITIN, TIBC, IRON, RETICCTPCT in the last 72 hours.  Coagulation profile No results for input(s): INR, PROTIME in the last 168 hours.  No results for input(s): DDIMER in the last 72 hours.  Cardiac Enzymes No results for input(s): CKMB, TROPONINI, MYOGLOBIN in the last 168 hours.  Invalid input(s): CK ------------------------------------------------------------------------------------------------------------------    Component Value Date/Time   BNP 51.8 09/03/2016 2052    Inpatient Medications  Scheduled Meds: . bisacodyl  5 mg Oral Once  . cefTRIAXone (ROCEPHIN)  IV  1 g Intravenous Q24H  . doxycycline (VIBRAMYCIN) IV  100 mg Intravenous Q12H  . furosemide  40 mg Oral Daily  . metoprolol tartrate  12.5 mg Oral BID  . polyethylene glycol  34 g Oral Once  . senna-docusate  2 tablet Oral BID   Continuous Infusions: . heparin 1,300 Units/hr (09/28/16 1049)  . lactated ringers 75 mL/hr at 09/27/16 1855   PRN Meds:.acetaminophen **OR** acetaminophen, bisacodyl, diphenhydrAMINE, HYDROcodone-acetaminophen, morphine injection, ondansetron **OR** ondansetron (ZOFRAN) IV, phenol, polyethylene glycol  Micro Results Recent Results (from the past 240 hour(s))  Culture, Urine     Status: None   Collection Time: 09/24/16  9:40 AM  Result Value Ref Range Status   Specimen Description URINE, RANDOM  Final   Special Requests NONE  Final   Culture   Final    NO GROWTH Performed at [facility], [address], [place], [place] [address]    Report Status 09/25/2016 FINAL  Final  MRSA PCR Screening      Status: Abnormal   Collection Time: 09/27/16 10:28 AM  Result Value Ref Range Status   MRSA by PCR INVALID RESULTS, SPECIMEN SENT FOR CULTURE (A) NEGATIVE Final    Comment: RESULT CALLED TO, READ BACK BY AND VERIFIED WITH: PENNIX,A @ 1217 ON [id number] BY POTEAT,S        The GeneXpert MRSA Assay (FDA approved for NASAL specimens only), is one component of a comprehensive MRSA colonization surveillance program. It is not intended to diagnose MRSA infection nor to guide or monitor treatment for MRSA infections.   MRSA culture     Status: None (Preliminary result)   Collection Time: 09/27/16 10:28 AM  Result Value Ref Range Status   Specimen Description NOSE  Final   Special Requests NONE  Final   Culture   Final    NO GROWTH, REINCUBATED Performed at [facility], [place] [address], [place], [place] [address]    Report Status PENDING  Incomplete  Aerobic Culture (superficial specimen)     Status: None (Preliminary result)   Collection Time: 09/27/16  5:47 PM  Result Value Ref Range Status   Specimen Description WOUND  Final   Special Requests ABDOMINAL WALL ABSCESS  Final   Gram Stain   Final    ABUNDANT WBC PRESENT, PREDOMINANTLY PMN MODERATE GRAM POSITIVE COCCI IN PAIRS MODERATE GRAM POSITIVE RODS MODERATE GRAM NEGATIVE RODS    Culture   Final    NO GROWTH < 24 HOURS Performed at [facility], [place] [address], [place], [place] [address]    Report Status PENDING  Incomplete    Radiology Reports Dg Chest 2 View  Result Date: 09/03/2016 CLINICAL DATA:  Shortness of breath 1 month with cough and chest pain as well as wheezing. EXAM: CHEST  2 VIEW COMPARISON:  None. FINDINGS: Lungs are adequately inflated without focal consolidation or effusion. Cardiomediastinal silhouette is within normal. Remaining bones and soft tissues are within normal. IMPRESSION: No active cardiopulmonary disease. Electronically Signed   By: Marin Olp M.D.   On: 09/03/2016 21:33   Ct  Angio Chest Pe W And/or Wo Contrast  Result Date: 09/03/2016 CLINICAL DATA:  Acute onset of shortness of breath with exertion. Bilateral lower extremity swelling. Initial encounter. EXAM: CT ANGIOGRAPHY CHEST WITH CONTRAST TECHNIQUE: Multidetector CT imaging of the chest was performed using the standard protocol during bolus administration of intravenous contrast. Multiplanar CT image reconstructions and MIPs were obtained to evaluate the vascular anatomy. CONTRAST:  80 mL of Isovue 370 IV contrast COMPARISON:  Chest radiograph performed earlier today at 9:20 p.m. FINDINGS: Cardiovascular: There is pulmonary embolus within the right main pulmonary artery, extending into all lobes of the right lung. There is also segmental pulmonary embolus within a pulmonary artery to the left lower lobe. The RV/LV ratio is just above 0.9, corresponding to mild right heart strain and at least submassive pulmonary embolus. The heart is otherwise unremarkable in appearance. The thoracic aorta is unremarkable. No calcific atherosclerotic disease is seen. The vessels are grossly unremarkable. Mediastinum/Nodes: The mediastinum is otherwise unremarkable in appearance. No mediastinal lymphadenopathy is seen. No pericardial effusion is identified. A 2.4 cm exophytic nodule is seen arising at the inferior aspect of the left thyroid lobe. No axillary lymphadenopathy is seen. Lungs/Pleura: Minimal bibasilar atelectasis is noted. No pleural effusion or pneumothorax is seen. No masses are identified. Upper Abdomen: The visualized portions of the liver and spleen are grossly unremarkable. The visualized portions of the gallbladder, pancreas, right and kidneys are grossly unremarkable. A 2.2 cm left adrenal adenoma is noted. Nonspecific left-sided perinephric stranding is seen. Musculoskeletal: No acute osseous abnormalities are identified. The visualized musculature is unremarkable in appearance. Review of the MIP images confirms the above  findings. IMPRESSION: 1. Pulmonary embolus within the right main pulmonary artery, extending into all lobes of the right lung. Segmental pulmonary embolus within the pulmonary artery to the left lower lobe. CT evidence of right heart strain (RV/LV Ratio = 0.91) consistent with at least submassive (intermediate risk) PE. The presence of right heart strain has been associated with an increased risk of morbidity and mortality. Please activate Code PE by paging [phone]. 2. Minimal bibasilar atelectasis noted.  Lungs otherwise clear. 3. **An incidental finding of potential clinical significance has been found. 2.4 cm exophytic nodule arising at the inferior aspect of the left thyroid lobe. Recommend further evaluation with thyroid ultrasound. If patient is clinically hyperthyroid, consider nuclear medicine thyroid uptake and scan.** 4. 2.2 cm left adrenal adenoma noted. Critical Value/emergent results were called by telephone at the time of interpretation on 09/03/2016 at 11:22 pm to Dr. [name], who verbally acknowledged these results. Electronically Signed   By: Garald Balding M.D.   On: 09/03/2016 23:30   Ct Pelvis Wo Contrast  Result Date: 09/22/2016 CLINICAL DATA:  Red, warm, extremely painful region lower anterior abdominal wall EXAM: CT PELVIS WITHOUT CONTRAST TECHNIQUE: Multidetector CT imaging of the pelvis was performed following the standard protocol without intravenous contrast. COMPARISON:  None. FINDINGS: Urinary Tract:  Unremarkable. Bowel:  Unremarkable. Vascular/Lymphatic: Unremarkable. Reproductive:  Unremarkable. Other:  No free fluid. Musculoskeletal: 4.1 x 5.0 cm irregular soft tissue attenuating lesion is identified in the  lower right pannus with surrounding edema/ inflammation and overlying skin thickening. Lesion not fully evaluated given the lack of intravenous contrast material. Bone windows reveal no worrisome lytic or sclerotic osseous lesions. Patient is status post lower  lumbar fusion. IMPRESSION: 1. 4.1 x 5.0 cm irregular soft tissue attenuating lesion in the subcutaneous fat of the lower right pannus. Given clinical history and the overlying skin thickening and adjacent edema/inflammation, this is probably phlegmon/evolving abscess. Subcutaneous hematoma or subcutaneous metastatic lesion cannot be excluded by imaging alone. Electronically Signed   By: Misty Stanley M.D.   On: 09/22/2016 12:23   US Renal  Result Date: 09/24/2016 CLINICAL DATA:  Acute renal failure EXAM: RENAL / URINARY TRACT ULTRASOUND COMPLETE COMPARISON:  Ultrasound 08/04/2016 FINDINGS: Right Kidney: Length: 11.6 cm. Echogenicity within normal limits. No mass or hydronephrosis visualized. Left Kidney: Length: 12.0 cm. Echogenicity within normal limits. No mass or hydronephrosis visualized. Bladder: Appears normal for degree of bladder distention. IMPRESSION: Normal renal ultrasound. Electronically Signed   By: Suzy Bouchard M.D.   On: 09/24/2016 20:34   US Thyroid  Result Date: 09/21/2016 CLINICAL DATA:  Incidental on CT.  Left thyroid nodule noted on CT EXAM: THYROID ULTRASOUND TECHNIQUE: Ultrasound examination of the thyroid gland and adjacent soft tissues was performed. COMPARISON:  None. FINDINGS: Parenchymal Echotexture: Normal Isthmus: 0.4 cm Right lobe: 5.3 x 1.7 x 2.1 cm Left lobe: 5.2 x 1.4 x 2.0 cm _________________________________________________________ Estimated total number of nodules >/= 1 cm: 0 Number of spongiform nodules >/=  2 cm not described below (TR1): 0 Number of mixed cystic and solid nodules >/= 1.5 cm not described below (TR2): 0 _________________________________________________________ Small nodules and cysts measuring 0.5 cm or less are scattered throughout the left lobe and have a benign appearance. IMPRESSION: Tiny nodules and cysts in left lobe were present. None meet criteria for fine needle aspiration biopsy or annual follow-up. The above is in keeping with the ACR  TI-RADS recommendations - J Am Coll Radiol 2017;14:587-595. Electronically Signed   By: Marybelle Killings M.D.   On: 09/21/2016 15:28    Isidora Laham M.D on 09/28/2016 at 12:48 PM  Between 7am to 7pm - Pager - [phone]  After 7pm go to [email or web address] - password [facility]  [facility] -  Office  [phone]

## 2016-09-28 NOTE — Progress Notes (Signed)
Caitlin Taylor for IV heparin Indication: recent PE/DVT, on apixaban prior to admission.  No Known Allergies  Patient Measurements: Height: 5\' 3"  (160 cm) Weight: 275 lb 3.2 oz (124.8 kg) IBW/kg (Calculated) : 52.4 Heparin Dosing Weight: 82.7 kg  Vital Signs: Temp: 98.7 F (37.1 C) (03/20 0601) Temp Source: Oral (03/20 0601) BP: 140/84 (03/20 0601) Pulse Rate: 78 (03/20 0601)  Labs:  Recent Labs  09/25/16 1144  09/26/16 0537 09/27/16 0553 09/28/16 0534  HGB  --   < > 9.2* 9.4* 9.0*  HCT  --   --  26.5* 27.7* 26.2*  PLT  --   --  190 215 153  APTT 77*  --  95* 72*  --   HEPARINUNFRC 1.03*  --  0.83* 0.56 0.42  CREATININE  --   --  1.70* 1.65*  --   < > = values in this interval not displayed.  Estimated Creatinine Clearance: 55.3 mL/min (A) (by C-G formula based on SCr of 1.65 mg/dL (H)).   Medical History: Past Medical History:  Diagnosis Date  . Chronic back pain   . CKD (chronic kidney disease) stage 2, GFR 60-89 ml/min   . DVT (deep venous thrombosis) ([place])   . Hypertension   . Lumbar stenosis   . Pulmonary embolism ([place])    a. diagnosed in 08/2016. Started on Eliquis    Assessment: 55 yoF with PMH HTN, recent bilateral PE with DVT diagnosed 09/03/16 and currently on Eliquis, presents to [facility] with abdominal wall cellulitis with possible abscess. Transferred to [facility] with Surgery to evaluate for I&D. Converting Eliquis to heparin per pharmacy in anticipation of possible surgery.   Baseline heparin level> 2.2  Prior anticoagulation: Eliquis 5 mg bid, last dose 3/13 at 2000  AKI noted on admission with SCr 2.06 (baseline ~1.3)  Significant events:  3/19  Heparin level/aPTT therapeutic on heparin 1300 units/hr this morning  CBC: hgb low but stable, Plt wnl  No bleeding or infusion issues per RN-->off at 0800 for OR  Renal function improving Today, 3/20  0534 HL =0.42 at goal, no bleeding or infusion issues per  RN  Goal of Therapy: Heparin level 0.3-0.7 units/ml Monitor platelets by anticoagulation protocol: Yes  Plan:  Continue heparin drip at 1300 units/hr  Daily Heparin level & CBC   Monitor for signs of bleeding or thrombosis  F/U plans to resume Eliquis once no further interventions planned  Caitlin Taylor 09/28/2016, 6:37 AM

## 2016-09-28 NOTE — Progress Notes (Addendum)
1 Day Post-Op  Subjective: Had some emesis last evening but none since then. No nausea now. Tolerating clears. Pain ok.   Objective: Vital signs in last 24 hours: Temp:  [97.9 F (36.6 C)-98.7 F (37.1 C)] 98.7 F (37.1 C) (03/20 0601) Pulse Rate:  [67-94] 78 (03/20 0601) Resp:  [11-21] 18 (03/20 0601) BP: (124-185)/(52-102) 140/84 (03/20 0601) SpO2:  [95 %-100 %] 95 % (03/20 0601) Last BM Date: 09/28/16  Intake/Output from previous day: 03/19 0701 - 03/20 0700 In: 3556.7 [P.O.:222; I.V.:2534.7; IV Piggyback:800] Out: 950 [Urine:900; Blood:50] Intake/Output this shift: No intake/output data recorded.  Morbidly obese,  Soft, decreased TTP, dressing with serosang drainage  Lab Results:   Recent Labs  09/27/16 0553 09/28/16 0534  WBC 13.8* 11.7*  HGB 9.4* 9.0*  HCT 27.7* 26.2*  PLT 215 153   BMET  Recent Labs  09/27/16 0553 09/28/16 0534  NA 139 139  K 3.7 3.6  CL 104 105  CO2 25 26  GLUCOSE 99 97  BUN 14 10  CREATININE 1.65* 1.51*  CALCIUM 9.1 9.0   PT/INR No results for input(s): LABPROT, INR in the last 72 hours. ABG No results for input(s): PHART, HCO3 in the last 72 hours.  Invalid input(s): PCO2, PO2  Studies/Results: No results found.  Anti-infectives: Anti-infectives    Start     Dose/Rate Route Frequency Ordered Stop   09/24/16 1000  doxycycline (VIBRAMYCIN) 100 mg in dextrose 5 % 250 mL IVPB     100 mg 125 mL/hr over 120 Minutes Intravenous Every 12 hours 09/24/16 0744     09/24/16 1000  cefTRIAXone (ROCEPHIN) 1 g in dextrose 5 % 50 mL IVPB     1 g 100 mL/hr over 30 Minutes Intravenous Every 24 hours 09/24/16 0926     09/23/16 1000  vancomycin (VANCOCIN) 1,500 mg in sodium chloride 0.9 % 500 mL IVPB  Status:  Discontinued     1,500 mg 250 mL/hr over 120 Minutes Intravenous Every 24 hours 09/22/16 1955 09/24/16 0735   09/22/16 2000  vancomycin (VANCOCIN) IVPB 1000 mg/200 mL premix  Status:  Discontinued     1,000 mg 200 mL/hr over 60  Minutes Intravenous  Once 09/22/16 1950 09/22/16 1953   09/22/16 2000  vancomycin (VANCOCIN) 2,000 mg in sodium chloride 0.9 % 500 mL IVPB     2,000 mg 250 mL/hr over 120 Minutes Intravenous  Once 09/22/16 1955 09/22/16 2222   09/22/16 1245  clindamycin (CLEOCIN) IVPB 600 mg     600 mg 100 mL/hr over 30 Minutes Intravenous  Once 09/22/16 1231 09/22/16 1634      Assessment/Plan: s/p Procedure(s): IRRIGATION AND DEBRIDEMENT OF ABDOMINAL WALL ABSCESS (N/A) POD 1 Dr Redmond Pulling Recent PE  Cont IV heparin No sign of significant bleeding, hgb stable Will do dressing change later today Cont iv abx f/u cultures Ambulate   Dressing changed and site measures 8.5 x 2.5 x 2.5 cm. I took a picture but it did not save to [facility].  Redressed with Kerlix wet to dry.  It was very painful for her but she did fairly well.  Leighton Ruff. Redmond Pulling, MD, FACS General, Bariatric, & Minimally Invasive Surgery [facility], [place]     LOS: 6 days    Gayland Curry 09/28/2016

## 2016-09-28 NOTE — Care Management Note (Signed)
Case Management Note  Patient Details  Name: Caitlin Taylor MRN: [id number] Date of Birth: 01-17-1971  Subjective/Objective:      abd wall abcess with Iand D in O.R. [id number].                Action/Plan:  Will follow for any hhc needs for surgical site of the lower abdomen. Date:  September 28, 2016 Chart reviewed for concurrent status and case management needs. Will continue to follow patient progress. Discharge Planning: following for needs Expected discharge date: [id number] Velva Harman, BSN, [place], [place]   Expected Discharge Date:                  Expected Discharge Plan:  [place]  In-House Referral:     Discharge planning Services  CM Consult  Post Acute Care Choice:    Choice offered to:     DME Arranged:    DME Agency:     HH Arranged:    [place] Agency:     Status of Service:  In process, will continue to follow  If discussed at Long Length of Stay Meetings, dates discussed:    Additional Comments:  Leeroy Cha, RN 09/28/2016, 10:26 AM

## 2016-09-29 ENCOUNTER — Encounter (HOSPITAL_COMMUNITY): Payer: Self-pay | Admitting: General Surgery

## 2016-09-29 LAB — RENAL FUNCTION PANEL
ALBUMIN: 3.3 g/dL — AB (ref 3.5–5.0)
ANION GAP: 11 (ref 5–15)
BUN: 11 mg/dL (ref 6–20)
CHLORIDE: 105 mmol/L (ref 101–111)
CO2: 25 mmol/L (ref 22–32)
Calcium: 9.3 mg/dL (ref 8.9–10.3)
Creatinine, Ser: 1.63 mg/dL — ABNORMAL HIGH (ref 0.44–1.00)
GFR, EST AFRICAN AMERICAN: 43 mL/min — AB (ref >60)
GFR, EST NON AFRICAN AMERICAN: 37 mL/min — AB (ref >60)
Glucose, Bld: 106 mg/dL — ABNORMAL HIGH (ref 65–99)
PHOSPHORUS: 3.3 mg/dL (ref 2.5–4.6)
POTASSIUM: 4.1 mmol/L (ref 3.5–5.1)
Sodium: 141 mmol/L (ref 135–145)

## 2016-09-29 LAB — CBC
HEMATOCRIT: 28 % — AB (ref 36.0–46.0)
HEMOGLOBIN: 9.4 g/dL — AB (ref 12.0–15.0)
MCH: 29.1 pg (ref 26.0–34.0)
MCHC: 33.6 g/dL (ref 30.0–36.0)
MCV: 86.7 fL (ref 78.0–100.0)
Platelets: 230 10*3/uL (ref 150–400)
RBC: 3.23 MIL/uL — AB (ref 3.87–5.11)
RDW: 14.3 % (ref 11.5–15.5)
WBC: 13 10*3/uL — AB (ref 4.0–10.5)

## 2016-09-29 LAB — HEPARIN LEVEL (UNFRACTIONATED): Heparin Unfractionated: <0.1 IU/mL — ABNORMAL LOW (ref 0.30–0.70)

## 2016-09-29 LAB — GLUCOSE, CAPILLARY: Glucose-Capillary: 95 mg/dL (ref 65–99)

## 2016-09-29 MED ORDER — APIXABAN 5 MG PO TABS
5.0000 mg | ORAL_TABLET | Freq: Two times a day (BID) | ORAL | Status: DC
  Administered 2016-09-29 – 2016-09-30 (×3): 5 mg via ORAL
  Filled 2016-09-29 (×2): qty 1
  Filled 2016-09-29: qty 2

## 2016-09-29 MED ORDER — FUROSEMIDE 40 MG PO TABS
80.0000 mg | ORAL_TABLET | Freq: Every day | ORAL | Status: DC
  Administered 2016-09-29 – 2016-09-30 (×2): 80 mg via ORAL
  Filled 2016-09-29: qty 2

## 2016-09-29 NOTE — Progress Notes (Signed)
This medical record was accessed by this user to perform a medical record documentation audit.

## 2016-09-29 NOTE — Progress Notes (Signed)
2 Days Post-Op  Subjective: Dr. Wynelle Cleveland in the room with her also.  Working on discharge plans.  She reports no one to help with dressing change.  No growth on culture so far.  I will look at wound with nurse after they have a chance to premedicate her.  Objective: Vital signs in last 24 hours: Temp:  [98.5 F (36.9 C)-98.8 F (37.1 C)] 98.8 F (37.1 C) (03/21 0454) Pulse Rate:  [72-86] 72 (03/21 0454) Resp:  [18-20] 20 (03/21 0454) BP: (115-147)/(70-89) 147/89 (03/21 0454) SpO2:  [97 %-99 %] 97 % (03/21 0454) Weight:  [127.4 kg (280 lb 14.4 oz)] 127.4 kg (280 lb 14.4 oz) (03/21 0454) Last BM Date: 09/28/16 600 PO 1140 IV Voided x 2 Stool x 1 Afebrile, VSS Creatinine 1.63 WBC 13K No xrays  Intake/Output from previous day: 03/20 0701 - 03/21 0700 In: 1740.6 [P.O.:600; I.V.:840.6; IV Piggyback:300] Out: -  Intake/Output this shift: Total I/O In: -  Out: 700 [Urine:700]  General appearance: alert, cooperative, no distress and anxious about dressing change. GI: soft, non-tender; bowel sounds normal; no masses,  no organomegaly and open site looks good, and she did much better with the dressing change.  Lab Results:   Recent Labs  09/28/16 0534 09/29/16 0624  WBC 11.7* 13.0*  HGB 9.0* 9.4*  HCT 26.2* 28.0*  PLT 153 230    BMET  Recent Labs  09/28/16 0534 09/29/16 0624  NA 139 141  K 3.6 4.1  CL 105 105  CO2 26 25  GLUCOSE 97 106*  BUN 10 11  CREATININE 1.51* 1.63*  CALCIUM 9.0 9.3   PT/INR No results for input(s): LABPROT, INR in the last 72 hours.   Recent Labs Lab 09/24/16 1150 09/25/16 0619 09/27/16 0553 09/28/16 0534 09/29/16 0624  AST 20  --   --   --   --   ALT 29  --   --   --   --   ALKPHOS 49  --   --   --   --   BILITOT 0.6  --   --   --   --   PROT 6.6  --   --   --   --   ALBUMIN 3.3* 3.6 3.2* 2.8* 3.3*     Lipase  No results found for: LIPASE   Studies/Results: No results found. Prior to Admission medications   Medication  Sig Start Date End Date Taking? Authorizing Provider  acetaminophen (TYLENOL) 500 MG tablet Take 500 mg by mouth every 6 (six) hours as needed (pain).   Yes Historical Provider, MD  apixaban (ELIQUIS) 5 MG TABS tablet Take 1 tablet (5 mg total) by mouth 2 (two) times daily. Please take 10 mg twice a day for 7 days and then 5 mg twice a day Patient taking differently: Take 5 mg by mouth 2 (two) times daily.  09/12/16  Yes Dron Tanna Furry, MD  diphenhydramine-acetaminophen (TYLENOL PM) 25-500 MG TABS tablet Take 1 tablet by mouth at bedtime as needed (pain).   Yes Historical Provider, MD  furosemide (LASIX) 80 MG tablet Take 1 tablet (80 mg total) by mouth daily. 09/06/16  Yes Dron Tanna Furry, MD  lisinopril-hydrochlorothiazide (PRINZIDE,ZESTORETIC) 20-25 MG tablet Take 1 tablet by mouth daily. 08/10/16  Yes Historical Provider, MD  metoprolol tartrate (LOPRESSOR) 25 MG tablet Take 0.5 tablets (12.5 mg total) by mouth 2 (two) times daily. 09/06/16  Yes Dron Tanna Furry, MD  spironolactone (ALDACTONE) 25 MG tablet Take 25  mg by mouth daily. 08/26/16  Yes Historical Provider, MD  Vitamin D, Ergocalciferol, (DRISDOL) 50000 units CAPS capsule Take 50,000 Units by mouth every Monday. 08/09/16  Yes Historical Provider, MD    Medications: . bisacodyl  5 mg Oral Once  . cefTRIAXone (ROCEPHIN)  IV  1 g Intravenous Q24H  . doxycycline (VIBRAMYCIN) IV  100 mg Intravenous Q12H  . furosemide  40 mg Oral Daily  . metoprolol tartrate  12.5 mg Oral BID  . polyethylene glycol  34 g Oral Once  . senna-docusate  2 tablet Oral BID   . heparin 1,300 Units/hr (09/28/16 1634)  . lactated ringers 75 mL/hr at 09/28/16 2115   Specimen Description WOUND 09/27/16  Special Requests ABDOMINAL WALL ABSCESS   Gram Stain ABUNDANT WBC PRESENT, PREDOMINANTLY PMN  MODERATE GRAM POSITIVE COCCI IN PAIRS  MODERATE GRAM POSITIVE RODS  MODERATE GRAM NEGATIVE RODS      Culture NO GROWTH < 24 HOURS        Assessment/Plan  Right lower quadrant abdominal abscess, panniculitis s/p Incision and debridement of abdominal wall abscess with scalpel.  Post op incision is 8.5 x 2.5 x 2.5 cm Hx of PE on Eliquis at home Hypertension Hx of chronic diastolic CHF Stage III chronic kidney disease Body mass index is 49.7 Anemia FEN:  IV fluids/regular diet ID:  Vancomycin 3/13 + 3/14  Clindamycin x 1 -  3/14  Ceftriaxone/doxycycline 3/16 =>> day 6 DVT:  Heparin drip  Plan:  Restart Eliquis, let her shower before dressing change.  She is being transitioned to PO antibiotics.  Hopefully home tomorrow.  She is getting home health request.  She has no one at home to help.  Home on Wet to dry dressing change, leave penrose in and follow up in our office 2 weeks with Dr. Redmond Pulling.       LOS: 7 days    Caitlin Taylor 09/29/2016 [phone]

## 2016-09-29 NOTE — Progress Notes (Signed)
PROGRESS NOTE    Caitlin Taylor   XLK:[id number]  DOB: [birth year]  DOA: 09/22/2016 PCP: [name], MD   Brief Narrative:  46 y.o.femalewith medical history significant for hypertension and recent diagnosis of DVT and PE and now managed with Eliquis, she was admitted with abdominal wall cellulitis, imaging with possible abscess versus phlegmon, followed by surgery, patient with creatinine of 2 on admission, Nephrotoxic medication has been held, followed by renal for acute renal failure, renal  function improving slowly, she  for I&D by surgery on 3/19  Subjective: Pain with dressing changes is her main issues. No other complaints.   Assessment & Plan:  Abdominal wall cellulitis with possible abscess  - Pt presents with 1 wk of worsening tenderness in lower abd and a couple days of chills of malaise , mild leukocytosis and AKI on presentation, but no fever or tachycardia; CT findings suggest a developing abscess  - holding Eliquis, on heparin infusion for I&D, - I&D by Dr. Redmond Pulling on 3/19 - IV vancomycin on hold in the setting of acute kidney injury, transitioned to IV doxycycline and Rocephin - Gram stain significant for gram-positive cocci, gram-positive rods, gram-negative rods  - Wound care per surgical team. "Home on Wet to dry dressing change, leave penrose in and follow up in our office 2 weeks with Dr. Redmond Pulling." - [facility]  Acute kidney injury superimposed on CKD stage III  - SCr is 2.06 on admission, baseline 1.3-1.5, peaked at  2.41, currently improving  - Continue to Hold lisinopril, Aldactone, and HCTZ - Renal consultation appreciated, most likely in the setting of ATN due to infection/volume depletion/ACE inhibitor . - will increase lasix today as weight has been increasing  Hx of DVT and PE  - Pt was diagnosed with DVT and b/l PE in February 2018  - Eliquis   Chronic diastolic CHF  - TTE  (3/66/44) with EF 03-47%, grade 1 diastolic dysfunction, no WMA, and no significant valvular disease  - She is managed at home with Lopressor, lisinopril, Lasix, and Aldactone  - Lopressor is continued -   lisinopril, and Aldactone held on admission in light of AKI  - givien improving renal function, has been resumed on Lasix- increasing dose today due to increasing weight  Hypertension - Blood pressure is acceptable    finding of thyroid nodule/adrenal adenoma in imaging - Patient reported this has been worked up by her new PCP, thyroid ultrasound done recently in outpatient setting  Morbid obesity - Body mass index is 49.76 kg/m.   DVT prophylaxis: Eliquis Code Status: Full code Family Communication:  Disposition Plan: home tomorrow Consultants:   gen surgery Procedures:   I and D Antimicrobials:  Anti-infectives    Start     Dose/Rate Route Frequency Ordered Stop   09/24/16 1000  doxycycline (VIBRAMYCIN) 100 mg in dextrose 5 % 250 mL IVPB     100 mg 125 mL/hr over 120 Minutes Intravenous Every 12 hours 09/24/16 0744     09/24/16 1000  cefTRIAXone (ROCEPHIN) 1 g in dextrose 5 % 50 mL IVPB     1 g 100 mL/hr over 30 Minutes Intravenous Every 24 hours 09/24/16 0926     09/23/16 1000  vancomycin (VANCOCIN) 1,500 mg in sodium chloride 0.9 % 500 mL IVPB  Status:  Discontinued     1,500 mg 250 mL/hr over 120 Minutes Intravenous Every 24 hours 09/22/16 1955 09/24/16 0735   09/22/16 2000  vancomycin (VANCOCIN) IVPB 1000 mg/200 mL premix  Status:  Discontinued     1,000 mg 200 mL/hr over 60 Minutes Intravenous  Once 09/22/16 1950 09/22/16 1953   09/22/16 2000  vancomycin (VANCOCIN) 2,000 mg in sodium chloride 0.9 % 500 mL IVPB     2,000 mg 250 mL/hr over 120 Minutes Intravenous  Once 09/22/16 1955 09/22/16 2222   09/22/16 1245  clindamycin (CLEOCIN) IVPB 600 mg     600 mg 100 mL/hr over 30 Minutes Intravenous  Once 09/22/16 1231 09/22/16 1634       Objective: Vitals:    09/28/16 1413 09/28/16 2021 09/29/16 0454 09/29/16 1431  BP: 115/70 124/81 (!) 147/89 128/85  Pulse: 86 75 72 77  Resp: 18 18 20 20   Temp: 98.5 F (36.9 C) 98.5 F (36.9 C) 98.8 F (37.1 C) 98 F (36.7 C)  TempSrc: Oral Oral Oral Oral  SpO2: 99% 99% 97% 97%  Weight:   127.4 kg (280 lb 14.4 oz)   Height:        Intake/Output Summary (Last 24 hours) at 09/29/16 1551 Last data filed at 09/29/16 1431  Gross per 24 hour  Intake          1080.62 ml  Output             1600 ml  Net          -519.38 ml   Filed Weights   09/26/16 0531 09/27/16 0612 09/29/16 0454  Weight: 123.8 kg (273 lb) 124.8 kg (275 lb 3.2 oz) 127.4 kg (280 lb 14.4 oz)    Examination: General exam: Appears comfortable  HEENT: PERRLA, oral mucosa moist, no sclera icterus or thrush Respiratory system: Clear to auscultation. Respiratory effort normal. Cardiovascular system: S1 & S2 heard, RRR.  No murmurs  Gastrointestinal system: Abdomen soft, non-tender, nondistended. Normal bowel sound. No organomegaly Central nervous system: Alert and oriented. No focal neurological deficits. Extremities: No cyanosis, clubbing or edema Skin: large abdominal wound 8.5 x 2.5 x 2.5 cm with penrose drains Psychiatry:  Mood & affect appropriate.     Data Reviewed: I have personally reviewed following labs and imaging studies  CBC:  Recent Labs Lab 09/25/16 0619 09/26/16 0537 09/27/16 0553 09/28/16 0534 09/29/16 0624  WBC 13.7* 14.5* 13.8* 11.7* 13.0*  HGB 10.9* 9.2* 9.4* 9.0* 9.4*  HCT 32.5* 26.5* 27.7* 26.2* 28.0*  MCV 87.6 87.5 86.0 86.8 86.7  PLT 198 190 215 153 154   Basic Metabolic Panel:  Recent Labs Lab 09/24/16 1150 09/25/16 0619 09/26/16 0537 09/27/16 0553 09/28/16 0534 09/29/16 0624  NA 136 138  --  139 139 141  K 3.5 3.7  --  3.7 3.6 4.1  CL 105 103  --  104 105 105  CO2 23 24  --  25 26 25   GLUCOSE 143* 109*  --  99 97 106*  BUN 25* 19  --  14 10 11   CREATININE 2.29* 1.98*  2.07* 1.70* 1.65*  1.51* 1.63*  CALCIUM 8.7* 9.2  --  9.1 9.0 9.3  PHOS  --  2.8  --  2.9 3.4 3.3   GFR: Estimated Creatinine Clearance: 56.7 mL/min (A) (by C-G formula based on SCr of 1.63 mg/dL (H)). Liver Function Tests:  Recent Labs Lab 09/24/16 1150 09/25/16 0619 09/27/16 0553 09/28/16 0534 09/29/16 0624  AST 20  --   --   --   --   ALT 29  --   --   --   --  ALKPHOS 49  --   --   --   --   BILITOT 0.6  --   --   --   --   PROT 6.6  --   --   --   --   ALBUMIN 3.3* 3.6 3.2* 2.8* 3.3*   No results for input(s): LIPASE, AMYLASE in the last 168 hours. No results for input(s): AMMONIA in the last 168 hours. Coagulation Profile: No results for input(s): INR, PROTIME in the last 168 hours. Cardiac Enzymes:  Recent Labs Lab 09/24/16 1150  CKTOTAL 29*   BNP (last 3 results) No results for input(s): PROBNP in the last 8760 hours. HbA1C: No results for input(s): HGBA1C in the last 72 hours. CBG:  Recent Labs Lab 09/25/16 0746 09/26/16 0721 09/27/16 0733 09/28/16 0751 09/29/16 0804  GLUCAP 100* 81 84 87 95   Lipid Profile: No results for input(s): CHOL, HDL, LDLCALC, TRIG, CHOLHDL, LDLDIRECT in the last 72 hours. Thyroid Function Tests: No results for input(s): TSH, T4TOTAL, FREET4, T3FREE, THYROIDAB in the last 72 hours. Anemia Panel: No results for input(s): VITAMINB12, FOLATE, FERRITIN, TIBC, IRON, RETICCTPCT in the last 72 hours. Urine analysis:    Component Value Date/Time   COLORURINE YELLOW 09/23/2016 1015   APPEARANCEUR CLOUDY (A) 09/23/2016 1015   LABSPEC 1.011 09/23/2016 1015   PHURINE 5.0 09/23/2016 1015   GLUCOSEU NEGATIVE 09/23/2016 1015   HGBUR MODERATE (A) 09/23/2016 1015   BILIRUBINUR NEGATIVE 09/23/2016 1015   KETONESUR NEGATIVE 09/23/2016 1015   PROTEINUR NEGATIVE 09/23/2016 1015   NITRITE NEGATIVE 09/23/2016 1015   LEUKOCYTESUR SMALL (A) 09/23/2016 1015   Sepsis Labs: @LABRCNTIP (procalcitonin:4,lacticidven:4) ) Recent Results (from the past 240  hour(s))  Culture, Urine     Status: None   Collection Time: 09/24/16  9:40 AM  Result Value Ref Range Status   Specimen Description URINE, RANDOM  Final   Special Requests NONE  Final   Culture   Final    NO GROWTH Performed at [facility], [place] [address] [place], [place] [address]    Report Status 09/25/2016 FINAL  Final  MRSA PCR Screening     Status: Abnormal   Collection Time: 09/27/16 10:28 AM  Result Value Ref Range Status   MRSA by PCR INVALID RESULTS, SPECIMEN SENT FOR CULTURE (A) NEGATIVE Final    Comment: RESULT CALLED TO, READ BACK BY AND VERIFIED WITH: PENNIX,A @ 1217 ON [id number] BY POTEAT,S        The GeneXpert MRSA Assay (FDA approved for NASAL specimens only), is one component of a comprehensive MRSA colonization surveillance program. It is not intended to diagnose MRSA infection nor to guide or monitor treatment for MRSA infections.   MRSA culture     Status: None   Collection Time: 09/27/16 10:28 AM  Result Value Ref Range Status   Specimen Description NOSE  Final   Special Requests NONE  Final   Culture   Final    NO MRSA DETECTED Performed at [facility], [address], [place], [place] [address]    Report Status 09/28/2016 FINAL  Final  Aerobic Culture (superficial specimen)     Status: None (Preliminary result)   Collection Time: 09/27/16  5:47 PM  Result Value Ref Range Status   Specimen Description WOUND  Final   Special Requests ABDOMINAL WALL ABSCESS  Final   Gram Stain   Final    ABUNDANT WBC PRESENT, PREDOMINANTLY PMN MODERATE GRAM POSITIVE COCCI IN PAIRS MODERATE GRAM  POSITIVE RODS MODERATE GRAM NEGATIVE RODS    Culture   Final    NO GROWTH 2 DAYS Performed at [facility], [place] [address], [place], [place] [address]    Report Status PENDING  Incomplete         Radiology Studies: No results found.    Scheduled Meds: . apixaban  5 mg Oral BID  . bisacodyl  5 mg Oral Once  . cefTRIAXone (ROCEPHIN)  IV   1 g Intravenous Q24H  . doxycycline (VIBRAMYCIN) IV  100 mg Intravenous Q12H  . furosemide  80 mg Oral Daily  . metoprolol tartrate  12.5 mg Oral BID  . polyethylene glycol  34 g Oral Once  . senna-docusate  2 tablet Oral BID   Continuous Infusions:   LOS: 7 days    Time spent in minutes: 13    Baldwin, MD [facility] Pager: [email or web address] Password [facility] 09/29/2016, 3:51 PM

## 2016-09-29 NOTE — Progress Notes (Signed)
[place] for IV heparin-->apixaban Indication: recent PE/DVT  No Known Allergies  Patient Measurements: Height: 5\' 3"  (160 cm) Weight: 280 lb 14.4 oz (127.4 kg) IBW/kg (Calculated) : 52.4 Heparin Dosing Weight: 82.7 kg  Vital Signs: Temp: 98.8 F (37.1 C) (03/21 0454) Temp Source: Oral (03/21 0454) BP: 147/89 (03/21 0454) Pulse Rate: 72 (03/21 0454)  Labs:  Recent Labs  09/27/16 0553 09/28/16 0534 09/29/16 0624  HGB 9.4* 9.0* 9.4*  HCT 27.7* 26.2* 28.0*  PLT 215 153 230  APTT 72*  --   --   HEPARINUNFRC 0.56 0.42 <0.10*  CREATININE 1.65* 1.51* 1.63*    Estimated Creatinine Clearance: 56.7 mL/min (A) (by C-G formula based on SCr of 1.63 mg/dL (H)).   Medical History: Past Medical History:  Diagnosis Date  . Chronic back pain   . CKD (chronic kidney disease) stage 2, GFR 60-89 ml/min   . DVT (deep venous thrombosis) ([place])   . Hypertension   . Lumbar stenosis   . Pulmonary embolism ([place])    a. diagnosed in 08/2016. Started on Eliquis    Assessment: 26 yoF with PMH HTN, recent bilateral PE with DVT diagnosed 09/03/16 and currently on Eliquis, presents to [facility] with abdominal wall cellulitis with possible abscess. Transferred to [facility] with Surgery to evaluate for I&D. Converting Eliquis to heparin per pharmacy in anticipation of possible surgery.   Baseline heparin level> 2.2  Prior anticoagulation: Eliquis 5 mg bid, last dose 3/13 at 2000  AKI noted on admission with SCr 2.06 (baseline ~1.3)  Significant events: 3/19: sp I&D 3/21: Change IV heparin back to Eliquis  Today, 09/29/2016:  Heparin level sub-therapeutic on heparin 1300 units/hr this morning  CBC: hgb low but stable, Plt wnl  No bleeding or infusion issues per RN  Renal function improving  Goal of Therapy: Heparin level 0.3-0.7 units/ml Monitor platelets by anticoagulation protocol: Yes  Plan:  D/C Heparin & heparin labs  Resume Eliquis 5mg  PO  BID  No dose adjustments anticipated- pharmacy will sign off & monitor peripherally  Netta Cedars, PharmD, BCPS Pager: [phone] 09/29/2016, 10:20 AM

## 2016-09-30 DIAGNOSIS — L02211 Cutaneous abscess of abdominal wall: Secondary | ICD-10-CM

## 2016-09-30 DIAGNOSIS — I824Y1 Acute embolism and thrombosis of unspecified deep veins of right proximal lower extremity: Secondary | ICD-10-CM

## 2016-09-30 DIAGNOSIS — I82401 Acute embolism and thrombosis of unspecified deep veins of right lower extremity: Secondary | ICD-10-CM

## 2016-09-30 LAB — RENAL FUNCTION PANEL
ANION GAP: 9 (ref 5–15)
Albumin: 3 g/dL — ABNORMAL LOW (ref 3.5–5.0)
BUN: 11 mg/dL (ref 6–20)
CALCIUM: 9.1 mg/dL (ref 8.9–10.3)
CHLORIDE: 104 mmol/L (ref 101–111)
CO2: 29 mmol/L (ref 22–32)
Creatinine, Ser: 1.53 mg/dL — ABNORMAL HIGH (ref 0.44–1.00)
GFR calc non Af Amer: 40 mL/min — ABNORMAL LOW (ref >60)
GFR, EST AFRICAN AMERICAN: 46 mL/min — AB (ref >60)
Glucose, Bld: 94 mg/dL (ref 65–99)
Phosphorus: 4 mg/dL (ref 2.5–4.6)
Potassium: 4.1 mmol/L (ref 3.5–5.1)
Sodium: 142 mmol/L (ref 135–145)

## 2016-09-30 LAB — AEROBIC CULTURE W GRAM STAIN (SUPERFICIAL SPECIMEN): Culture: NO GROWTH

## 2016-09-30 LAB — CBC
HCT: 26 % — ABNORMAL LOW (ref 36.0–46.0)
HEMOGLOBIN: 8.9 g/dL — AB (ref 12.0–15.0)
MCH: 30.1 pg (ref 26.0–34.0)
MCHC: 34.2 g/dL (ref 30.0–36.0)
MCV: 87.8 fL (ref 78.0–100.0)
Platelets: 196 10*3/uL (ref 150–400)
RBC: 2.96 MIL/uL — AB (ref 3.87–5.11)
RDW: 14.4 % (ref 11.5–15.5)
WBC: 9.7 10*3/uL (ref 4.0–10.5)

## 2016-09-30 LAB — AEROBIC CULTURE  (SUPERFICIAL SPECIMEN)

## 2016-09-30 LAB — GLUCOSE, CAPILLARY: GLUCOSE-CAPILLARY: 93 mg/dL (ref 65–99)

## 2016-09-30 MED ORDER — HYDROCODONE-ACETAMINOPHEN 5-325 MG PO TABS: 1.0000 | ORAL_TABLET | ORAL | 0 refills | Status: DC | PRN

## 2016-09-30 MED ORDER — POLYETHYLENE GLYCOL 3350 17 G PO PACK: 17.0000 g | PACK | Freq: Every day | ORAL | 0 refills | Status: AC | PRN

## 2016-09-30 MED ORDER — BISACODYL 5 MG PO TBEC: 5.0000 mg | DELAYED_RELEASE_TABLET | Freq: Every day | ORAL | 0 refills | Status: DC | PRN

## 2016-09-30 NOTE — Progress Notes (Signed)
Patient ID: Caitlin Taylor, female   DOB: 1970/09/26, 46 y.o.   MRN: [id number]  [facility] Surgery Progress Note  3 Days Post-Op  Subjective: Feeling well this morning. Tolerating dressing changes well. Concerned with who will help dressing changes at home.   Objective: Vital signs in last 24 hours: Temp:  [98 F (36.7 C)-98.6 F (37 C)] 98.2 F (36.8 C) (03/22 0639) Pulse Rate:  [74-78] 78 (03/22 0639) Resp:  [18-20] 18 (03/22 0639) BP: (128-142)/(81-85) 137/81 (03/22 0639) SpO2:  [97 %-98 %] 98 % (03/22 0639) Weight:  [276 lb 6.4 oz (125.4 kg)] 276 lb 6.4 oz (125.4 kg) (03/22 0639) Last BM Date: 09/28/16  Intake/Output from previous day: 03/21 0701 - 03/22 0700 In: 800 [IV Piggyback:800] Out: 1600 [Urine:1600] Intake/Output this shift: Total I/O In: 240 [P.O.:240] Out: -   PE: Gen:  Alert, NAD, pleasant Pulm:  Effort normal Abd: soft, non-tender; bowel sounds normal; open site under right side of panus looks good with no purulent drainage, no bloody drainage  Lab Results:   Recent Labs  09/29/16 0624 09/30/16 0556  WBC 13.0* 9.7  HGB 9.4* 8.9*  HCT 28.0* 26.0*  PLT 230 196   BMET  Recent Labs  09/29/16 0624 09/30/16 0556  NA 141 142  K 4.1 4.1  CL 105 104  CO2 25 29  GLUCOSE 106* 94  BUN 11 11  CREATININE 1.63* 1.53*  CALCIUM 9.3 9.1   PT/INR No results for input(s): LABPROT, INR in the last 72 hours. CMP     Component Value Date/Time   NA 142 09/30/2016 0556   K 4.1 09/30/2016 0556   CL 104 09/30/2016 0556   CO2 29 09/30/2016 0556   GLUCOSE 94 09/30/2016 0556   BUN 11 09/30/2016 0556   CREATININE 1.53 (H) 09/30/2016 0556   CALCIUM 9.1 09/30/2016 0556   PROT 6.6 09/24/2016 1150   ALBUMIN 3.0 (L) 09/30/2016 0556   AST 20 09/24/2016 1150   ALT 29 09/24/2016 1150   ALKPHOS 49 09/24/2016 1150   BILITOT 0.6 09/24/2016 1150   GFRNONAA 40 (L) 09/30/2016 0556   GFRAA 46 (L) 09/30/2016 0556   Lipase  No results found for:  LIPASE     Studies/Results: No results found.  Anti-infectives: Anti-infectives    Start     Dose/Rate Route Frequency Ordered Stop   09/24/16 1000  doxycycline (VIBRAMYCIN) 100 mg in dextrose 5 % 250 mL IVPB     100 mg 125 mL/hr over 120 Minutes Intravenous Every 12 hours 09/24/16 0744     09/24/16 1000  cefTRIAXone (ROCEPHIN) 1 g in dextrose 5 % 50 mL IVPB     1 g 100 mL/hr over 30 Minutes Intravenous Every 24 hours 09/24/16 0926     09/23/16 1000  vancomycin (VANCOCIN) 1,500 mg in sodium chloride 0.9 % 500 mL IVPB  Status:  Discontinued     1,500 mg 250 mL/hr over 120 Minutes Intravenous Every 24 hours 09/22/16 1955 09/24/16 0735   09/22/16 2000  vancomycin (VANCOCIN) IVPB 1000 mg/200 mL premix  Status:  Discontinued     1,000 mg 200 mL/hr over 60 Minutes Intravenous  Once 09/22/16 1950 09/22/16 1953   09/22/16 2000  vancomycin (VANCOCIN) 2,000 mg in sodium chloride 0.9 % 500 mL IVPB     2,000 mg 250 mL/hr over 120 Minutes Intravenous  Once 09/22/16 1955 09/22/16 2222   09/22/16 1245  clindamycin (CLEOCIN) IVPB 600 mg     600 mg 100  mL/hr over 30 Minutes Intravenous  Once 09/22/16 1231 09/22/16 1634       Assessment/Plan Right lower quadrant abdominal abscess, panniculitis s/p Incision and debridement of abdominal wall abscess with scalpel.  Post op incision is 8.5 x 2.5 x 2.5 cm Hx of PE on Eliquis at home Hypertension Hx of chronic diastolic CHF Stage III chronic kidney disease Body mass index is 49.7 Anemia  Plan:  patient is ready for discharge from a surgical standpoint. Continue daily wet to dry dressing changes. Shower daily. Antibiotics per IM. She will follow-up in our office in about 2 weeks, but knows to call with concerns.   LOS: 8 days    Jerrye Beavers , [facility] Surgery 09/30/2016, 9:44 AM Pager: [phone] Consults: [phone] Mon-Fri 7:00 am-4:30 pm Sat-Sun 7:00 am-11:30 am

## 2016-09-30 NOTE — Progress Notes (Signed)
Patient given discharge instructions, and verbalized an understanding of all discharge instructions.  Patient agrees with discharge plan, and is being discharged in stable medical condition.  Patient instructed on how to change dressing at home, and verbalized an understanding of how to perform dressing change.

## 2016-09-30 NOTE — Progress Notes (Addendum)
Date:  September 30, 2016 Chart reviewed for concurrent status and case management needs. Will continue to follow patient progress. Discharge Planning:[facility]ed [facility] and [facility]-unable to take due to insurance provider and lack of staffing/tct-laShadaw Lennox Grumbles with [facility]/message with needs and request for call back. 1205 tcf-Lashawda with [facility]/will check and see if agency can handle and call back./tcf-Lashawda/agency is unable to handle due to staffing. Tct-Deb.Berline Lopes with [facility]/explained situation to callee on the phone will awaiting call back. tct-[facility]/DeDe/unable to take any more wound care cases at this time. Tct-[place]-[facility]rew/explained needs will call back/ 1521/spoke with patient/informed that CM is having diffiuclty finding a hhc to come out everyday.  Patient has 2 46 y.o. Children at home, does not have any family that can help/does not want to ask neighbors/states that she can do the dressing changes with the children assisting her.  Spoke to the floor RN Shanon Brow Bloick-informed him of the progress and the patients statement/will teach patient how to do wet to dry dressings/did inform the patient not to leave for home until I hear back from all the hhc agencies about status of visits. 1526/spoke to Dian Situ with [facility] agency will accept the case.  Patient and floor RN informed. Velva Harman, BSN, [place], [place]

## 2016-09-30 NOTE — Discharge Instructions (Addendum)
WOUND CARE: - dressing to be changed daily - supplies: sterile saline, kerlix, scissors, ABD pads, tape  - remove dressing and all packing carefully, moistening with sterile saline as needed to avoid packing/internal dressing sticking to the wound. - clean edges of skin around the wound with water/gauze, making sure there is no tape debris or leakage left on skin that could cause skin irritation or breakdown. - dampen and clean kerlix with sterile saline and pack wound from wound base to skin level, making sure to take note of any possible areas of wound tracking, tunneling and packing appropriately. Wound can be packed loosely. Trim kerlix to size if a whole kerlix is not required. - cover wound with a dry ABD pad and secure with tape.  - write the date/time on the dry dressing/tape to better track when the last dressing change occurred. - change dressing as needed if leakage occurs, wound gets contaminated, or patient requests to shower. - patient may shower daily with wound open and following the shower the wound should be dried and a clean dressing placed.   Please take all your medications with you for your next visit with your Primary MD. Please request your Primary MD to go over all hospital test results at the follow up. Please ask your Primary MD to get all Hospital records sent to his/her office.  If you experience worsening of your admission symptoms, develop shortness of breath, chest pain, suicidal or homicidal thoughts or a life threatening emergency, you must seek medical attention immediately by calling 911 or calling your MD.  Dennis Bast must read the complete instructions/literature along with all the possible adverse reactions/side effects for all the medicines you take including new medications that have been prescribed to you. Take new medicines after you have completely understood and accpet all the possible adverse reactions/side effects.   Do not drive when taking pain  medications or sedatives.    Do not take more than prescribed Pain, Sleep and Anxiety Medications  If you have smoked or chewed Tobacco in the last 2 yrs please stop. Stop any regular alcohol and or recreational drug use.  Wear Seat belts while driving.

## 2016-09-30 NOTE — Progress Notes (Signed)
Student nurse assessment

## 2016-09-30 NOTE — Discharge Summary (Signed)
Physician Discharge Summary  Caitlin Taylor KYH:[id number] DOB: 11-Sep-1970 DOA: 09/22/2016  PCP: [name], MD  Admit date: 09/22/2016 Discharge date: 09/30/2016  Admitted From: home  Disposition:  home   Recommendations for Outpatient Follow-up:  1. cont to hold Aldactone- daily weights- Bmet in 5 days by [facility] and PCP to then decide on resuming [place]: RN and Aid Equipment/Devices:      Discharge Condition:  stable   CODE STATUS:  Full code   Diet recommendation:  Low sodium heart healthy Consultations:  gen surgery    Discharge Diagnoses:  Principal Problem:   Abdominal wall abscess Active Problems:   History of pulmonary embolus (PE)   AKI (acute kidney injury) ([place])   Right leg DVT ([place])   Benign essential HTN   Normocytic anemia   Morbid obesity due to excess calories ([place])   CKD (chronic kidney disease), stage III   Chronic diastolic CHF (congestive heart failure) (H[facility])    Subjective: No complaints. We discussed weight loss today.   Brief Summary: 46 y.o.femalewith medical history significant for hypertension,  recent diagnosis of DVT and PE on Eliquis, chronic dCHF, normocytic anemia, morbid obesity admitted with abdominal wall cellulitis with possible abscess versus phlegmon on CT and mild AKI on CKD 3.    Hospital Course:  Abdominal wall cellulitis with possible abscess  - Pt presents with 1 wk of worsening tenderness in lower abd and a couple days of chills of malaise , mild leukocytosis and AKI on presentation, but no fever or tachycardia; CT findings suggest a developing abscess  - I&D by Dr. Redmond Pulling on 3/19 - Gram stain significant for gram-positive cocci, gram-positive rods, gram-negative rods  - initially given Clindamycin then IV vancomycin and then  IV doxycycline and Rocephin- tday is day 9 of antibiotics, wound is quite clean- will stop antibiotics today - Wound care per surgical team. "Home on Wet to dry dressing change, leave  penrose in and follow up in our office 2 weeks with Dr. Redmond Pulling." - severe pain with dressing changes, need Hydrocodone prior to it  [facility] for dressings   Acute kidney injury superimposed on CKD stage III  - SCr is 2.06 on admission, baseline 1.3-1.5, peaked at 2.41, currently improving  - Continue to Hold lisinopril, Aldactone, and HCTZ - Renal consultation appreciated, most likely in the setting of ATN due to infection/volume depletion/ACE inhibitor .  Hx of DVT and PE  - Pt was diagnosed with DVT and b/l PE in February 2018  - - transitioned to Heparin for I and D- resumed Eliquis 3/22    Chronic diastolic CHF  - TTE (1/51/76) with EF 16-07%, grade 1 diastolic dysfunction, no WMA, and no significant valvular disease  - She is managed at home with Lopressor, lisinopril, Lasix, and Aldactone  - Lopressor continued - Lasix, lisinopril, and Aldactone held on admission in light of AKI  - 3/18 givenimproving renal function, has been resumed on Lasix 40 mg daily  - 3/22 increased dose to home dose of 80 mg daily due to increasing weight- weight down to 125 kg today - Cr stable - will resume Lisinopril/ HCTZ  - cont to hold Aldactone- daily weights- Bmet in 5 days by [facility] and PCP to then decide on resuming Aldactone  Hypertension - Blood pressure is acceptable   Thyroid nodule/adrenal adenoma in imaging - Patient reported this has been worked up by her new PCP, thyroid ultrasound done recently in outpatient setting  Morbid obesity -  Body mass index is 49.76 kg/m. - discussed importance of losing weight and strategies she can use- she knows to cut back on fried food and chocolate   Discharge Instructions  Discharge Instructions    ([place]) Call MD:  Anytime you have any of the following symptoms: 1) 3 pound weight gain in 24 hours or 5 pounds in 1 week 2) shortness of breath, with or without a dry hacking cough 3) swelling in the hands, feet or stomach 4) if  you have to sleep on extra pillows at night in order to breathe.    Complete by:  As directed    Diet - low sodium heart healthy    Complete by:  As directed    Increase activity slowly    Complete by:  As directed      Allergies as of 09/30/2016   No Known Allergies     Medication List    STOP taking these medications   diphenhydramine-acetaminophen 25-500 MG Tabs tablet Commonly known as:  TYLENOL PM   spironolactone 25 MG tablet Commonly known as:  ALDACTONE     TAKE these medications   acetaminophen 500 MG tablet Commonly known as:  TYLENOL Take 500 mg by mouth every 6 (six) hours as needed (pain).   apixaban 5 MG Tabs tablet Commonly known as:  ELIQUIS Take 1 tablet (5 mg total) by mouth 2 (two) times daily. Please take 10 mg twice a day for 7 days and then 5 mg twice a day What changed:  additional instructions   bisacodyl 5 MG EC tablet Commonly known as:  DULCOLAX Take 1 tablet (5 mg total) by mouth daily as needed for moderate constipation.   furosemide 80 MG tablet Commonly known as:  LASIX Take 1 tablet (80 mg total) by mouth daily.   HYDROcodone-acetaminophen 5-325 MG tablet Commonly known as:  NORCO/VICODIN Take 1-2 tablets by mouth every 4 (four) hours as needed for moderate pain.   lisinopril-hydrochlorothiazide 20-25 MG tablet Commonly known as:  PRINZIDE,ZESTORETIC Take 1 tablet by mouth daily.   metoprolol tartrate 25 MG tablet Commonly known as:  LOPRESSOR Take 0.5 tablets (12.5 mg total) by mouth 2 (two) times daily.   polyethylene glycol packet Commonly known as:  MIRALAX / GLYCOLAX Take 17 g by mouth daily as needed for mild constipation.   Vitamin D (Ergocalciferol) 50000 units Caps capsule Commonly known as:  DRISDOL Take 50,000 Units by mouth every Monday.      Follow-up Information    Gayland Curry, MD Follow up.   Specialty:  General Surgery Why:  Call and make an appointment in 2 weeks, tell them you have a penrose drain in.   Call if you have problems before that. Contact information: [address] [place] [address] [phone]          No Known Allergies   Procedures/Studies:  I and D  Dg Chest 2 View  Result Date: 09/03/2016 CLINICAL DATA:  Shortness of breath 1 month with cough and chest pain as well as wheezing. EXAM: CHEST  2 VIEW COMPARISON:  None. FINDINGS: Lungs are adequately inflated without focal consolidation or effusion. Cardiomediastinal silhouette is within normal. Remaining bones and soft tissues are within normal. IMPRESSION: No active cardiopulmonary disease. Electronically Signed   By: Marin Olp M.D.   On: 09/03/2016 21:33   Ct Angio Chest Pe W And/or Wo Contrast  Result Date: 09/03/2016 CLINICAL DATA:  Acute onset of shortness of breath  with exertion. Bilateral lower extremity swelling. Initial encounter. EXAM: CT ANGIOGRAPHY CHEST WITH CONTRAST TECHNIQUE: Multidetector CT imaging of the chest was performed using the standard protocol during bolus administration of intravenous contrast. Multiplanar CT image reconstructions and MIPs were obtained to evaluate the vascular anatomy. CONTRAST:  80 mL of Isovue 370 IV contrast COMPARISON:  Chest radiograph performed earlier today at 9:20 p.m. FINDINGS: Cardiovascular: There is pulmonary embolus within the right main pulmonary artery, extending into all lobes of the right lung. There is also segmental pulmonary embolus within a pulmonary artery to the left lower lobe. The RV/LV ratio is just above 0.9, corresponding to mild right heart strain and at least submassive pulmonary embolus. The heart is otherwise unremarkable in appearance. The thoracic aorta is unremarkable. No calcific atherosclerotic disease is seen. The vessels are grossly unremarkable. Mediastinum/Nodes: The mediastinum is otherwise unremarkable in appearance. No mediastinal lymphadenopathy is seen. No pericardial effusion is identified. A 2.4 cm exophytic nodule is  seen arising at the inferior aspect of the left thyroid lobe. No axillary lymphadenopathy is seen. Lungs/Pleura: Minimal bibasilar atelectasis is noted. No pleural effusion or pneumothorax is seen. No masses are identified. Upper Abdomen: The visualized portions of the liver and spleen are grossly unremarkable. The visualized portions of the gallbladder, pancreas, right and kidneys are grossly unremarkable. A 2.2 cm left adrenal adenoma is noted. Nonspecific left-sided perinephric stranding is seen. Musculoskeletal: No acute osseous abnormalities are identified. The visualized musculature is unremarkable in appearance. Review of the MIP images confirms the above findings. IMPRESSION: 1. Pulmonary embolus within the right main pulmonary artery, extending into all lobes of the right lung. Segmental pulmonary embolus within the pulmonary artery to the left lower lobe. CT evidence of right heart strain (RV/LV Ratio = 0.91) consistent with at least submassive (intermediate risk) PE. The presence of right heart strain has been associated with an increased risk of morbidity and mortality. Please activate Code PE by paging [phone]. 2. Minimal bibasilar atelectasis noted.  Lungs otherwise clear. 3. **An incidental finding of potential clinical significance has been found. 2.4 cm exophytic nodule arising at the inferior aspect of the left thyroid lobe. Recommend further evaluation with thyroid ultrasound. If patient is clinically hyperthyroid, consider nuclear medicine thyroid uptake and scan.** 4. 2.2 cm left adrenal adenoma noted. Critical Value/emergent results were called by telephone at the time of interpretation on 09/03/2016 at 11:22 pm to Dr. [name], who verbally acknowledged these results. Electronically Signed   By: Garald Balding M.D.   On: 09/03/2016 23:30   Ct Pelvis Wo Contrast  Result Date: 09/22/2016 CLINICAL DATA:  Red, warm, extremely painful region lower anterior abdominal wall EXAM: CT  PELVIS WITHOUT CONTRAST TECHNIQUE: Multidetector CT imaging of the pelvis was performed following the standard protocol without intravenous contrast. COMPARISON:  None. FINDINGS: Urinary Tract:  Unremarkable. Bowel:  Unremarkable. Vascular/Lymphatic: Unremarkable. Reproductive:  Unremarkable. Other:  No free fluid. Musculoskeletal: 4.1 x 5.0 cm irregular soft tissue attenuating lesion is identified in the lower right pannus with surrounding edema/ inflammation and overlying skin thickening. Lesion not fully evaluated given the lack of intravenous contrast material. Bone windows reveal no worrisome lytic or sclerotic osseous lesions. Patient is status post lower lumbar fusion. IMPRESSION: 1. 4.1 x 5.0 cm irregular soft tissue attenuating lesion in the subcutaneous fat of the lower right pannus. Given clinical history and the overlying skin thickening and adjacent edema/inflammation, this is probably phlegmon/evolving abscess. Subcutaneous hematoma or subcutaneous metastatic lesion cannot be excluded by imaging alone.  Electronically Signed   By: Misty Stanley M.D.   On: 09/22/2016 12:23   US Renal  Result Date: 09/24/2016 CLINICAL DATA:  Acute renal failure EXAM: RENAL / URINARY TRACT ULTRASOUND COMPLETE COMPARISON:  Ultrasound 08/04/2016 FINDINGS: Right Kidney: Length: 11.6 cm. Echogenicity within normal limits. No mass or hydronephrosis visualized. Left Kidney: Length: 12.0 cm. Echogenicity within normal limits. No mass or hydronephrosis visualized. Bladder: Appears normal for degree of bladder distention. IMPRESSION: Normal renal ultrasound. Electronically Signed   By: Suzy Bouchard M.D.   On: 09/24/2016 20:34   US Thyroid  Result Date: 09/21/2016 CLINICAL DATA:  Incidental on CT.  Left thyroid nodule noted on CT EXAM: THYROID ULTRASOUND TECHNIQUE: Ultrasound examination of the thyroid gland and adjacent soft tissues was performed. COMPARISON:  None. FINDINGS: Parenchymal Echotexture: Normal Isthmus: 0.4  cm Right lobe: 5.3 x 1.7 x 2.1 cm Left lobe: 5.2 x 1.4 x 2.0 cm _________________________________________________________ Estimated total number of nodules >/= 1 cm: 0 Number of spongiform nodules >/=  2 cm not described below (TR1): 0 Number of mixed cystic and solid nodules >/= 1.5 cm not described below (TR2): 0 _________________________________________________________ Small nodules and cysts measuring 0.5 cm or less are scattered throughout the left lobe and have a benign appearance. IMPRESSION: Tiny nodules and cysts in left lobe were present. None meet criteria for fine needle aspiration biopsy or annual follow-up. The above is in keeping with the ACR TI-RADS recommendations - J Am Coll Radiol 2017;14:587-595. Electronically Signed   By: Marybelle Killings M.D.   On: 09/21/2016 15:28       Discharge Exam: Vitals:   09/29/16 2145 09/30/16 0639  BP: (!) 142/83 137/81  Pulse: 74 78  Resp: 18 18  Temp: 98.6 F (37 C) 98.2 F (36.8 C)   Vitals:   09/29/16 0454 09/29/16 1431 09/29/16 2145 09/30/16 0639  BP: (!) 147/89 128/85 (!) 142/83 137/81  Pulse: 72 77 74 78  Resp: 20 20 18 18   Temp: 98.8 F (37.1 C) 98 F (36.7 C) 98.6 F (37 C) 98.2 F (36.8 C)  TempSrc: Oral Oral Oral Oral  SpO2: 97% 97% 98% 98%  Weight: 127.4 kg (280 lb 14.4 oz)   125.4 kg (276 lb 6.4 oz)  Height:        General: Pt is alert, awake, not in acute distress Cardiovascular: RRR, S1/S2 +, no rubs, no gallops Respiratory: CTA bilaterally, no wheezing, no rhonchi Abdominal: Soft, NT, ND, bowel sounds + Extremities:   Edema b/l LE but R > L , no cyanosis Skin: large abdominal wound 8.5 x 2.5 x 2.5 cm with penrose drains   The results of significant diagnostics from this hospitalization (including imaging, microbiology, ancillary and laboratory) are listed below for reference.     Microbiology: Recent Results (from the past 240 hour(s))  Culture, Urine     Status: None   Collection Time: 09/24/16  9:40 AM   Result Value Ref Range Status   Specimen Description URINE, RANDOM  Final   Special Requests NONE  Final   Culture   Final    NO GROWTH Performed at [facility], [address] [place], [place] [address]    Report Status 09/25/2016 FINAL  Final  MRSA PCR Screening     Status: Abnormal   Collection Time: 09/27/16 10:28 AM  Result Value Ref Range Status   MRSA by PCR INVALID RESULTS, SPECIMEN SENT FOR CULTURE (A) NEGATIVE Final    Comment: RESULT CALLED TO,  READ BACK BY AND VERIFIED WITH: PENNIX,A @ 3086 ON [id number] BY POTEAT,S        The GeneXpert MRSA Assay (FDA approved for NASAL specimens only), is one component of a comprehensive MRSA colonization surveillance program. It is not intended to diagnose MRSA infection nor to guide or monitor treatment for MRSA infections.   MRSA culture     Status: None   Collection Time: 09/27/16 10:28 AM  Result Value Ref Range Status   Specimen Description NOSE  Final   Special Requests NONE  Final   Culture   Final    NO MRSA DETECTED Performed at [facility], [address] [place], [place] [address]    Report Status 09/28/2016 FINAL  Final  Aerobic Culture (superficial specimen)     Status: None (Preliminary result)   Collection Time: 09/27/16  5:47 PM  Result Value Ref Range Status   Specimen Description WOUND  Final   Special Requests ABDOMINAL WALL ABSCESS  Final   Gram Stain   Final    ABUNDANT WBC PRESENT, PREDOMINANTLY PMN MODERATE GRAM POSITIVE COCCI IN PAIRS MODERATE GRAM POSITIVE RODS MODERATE GRAM NEGATIVE RODS    Culture   Final    NO GROWTH 2 DAYS Performed at [facility], [place] [address], [place],  [address]    Report Status PENDING  Incomplete     Labs: BNP (last 3 results)  Recent Labs  09/03/16 2052  BNP 44.0   Basic Metabolic Panel:  Recent Labs Lab 09/25/16 0619 09/26/16 0537 09/27/16 0553 09/28/16 0534 09/29/16 0624 09/30/16 0556  NA 138  --  139 139 141 142   K 3.7  --  3.7 3.6 4.1 4.1  CL 103  --  104 105 105 104  CO2 24  --  25 26 25 29   GLUCOSE 109*  --  99 97 106* 94  BUN 19  --  14 10 11 11   CREATININE 1.98*  2.07* 1.70* 1.65* 1.51* 1.63* 1.53*  CALCIUM 9.2  --  9.1 9.0 9.3 9.1  PHOS 2.8  --  2.9 3.4 3.3 4.0   Liver Function Tests:  Recent Labs Lab 09/24/16 1150 09/25/16 0619 09/27/16 0553 09/28/16 0534 09/29/16 0624 09/30/16 0556  AST 20  --   --   --   --   --   ALT 29  --   --   --   --   --   ALKPHOS 49  --   --   --   --   --   BILITOT 0.6  --   --   --   --   --   PROT 6.6  --   --   --   --   --   ALBUMIN 3.3* 3.6 3.2* 2.8* 3.3* 3.0*   No results for input(s): LIPASE, AMYLASE in the last 168 hours. No results for input(s): AMMONIA in the last 168 hours. CBC:  Recent Labs Lab 09/26/16 0537 09/27/16 0553 09/28/16 0534 09/29/16 0624 09/30/16 0556  WBC 14.5* 13.8* 11.7* 13.0* 9.7  HGB 9.2* 9.4* 9.0* 9.4* 8.9*  HCT 26.5* 27.7* 26.2* 28.0* 26.0*  MCV 87.5 86.0 86.8 86.7 87.8  PLT 190 215 153 230 196   Cardiac Enzymes:  Recent Labs Lab 09/24/16 1150  CKTOTAL 29*   BNP: Invalid input(s): POCBNP CBG:  Recent Labs Lab 09/26/16 0721 09/27/16 0733 09/28/16 0751 09/29/16 0804 09/30/16 0733  GLUCAP 81 84 87 95 93  D-Dimer No results for input(s): DDIMER in the last 72 hours. Hgb A1c No results for input(s): HGBA1C in the last 72 hours. Lipid Profile No results for input(s): CHOL, HDL, LDLCALC, TRIG, CHOLHDL, LDLDIRECT in the last 72 hours. Thyroid function studies No results for input(s): TSH, T4TOTAL, T3FREE, THYROIDAB in the last 72 hours.  Invalid input(s): FREET3 Anemia work up No results for input(s): VITAMINB12, FOLATE, FERRITIN, TIBC, IRON, RETICCTPCT in the last 72 hours. Urinalysis    Component Value Date/Time   COLORURINE YELLOW 09/23/2016 1015   APPEARANCEUR CLOUDY (A) 09/23/2016 1015   LABSPEC 1.011 09/23/2016 1015   PHURINE 5.0 09/23/2016 1015   GLUCOSEU NEGATIVE 09/23/2016  1015   HGBUR MODERATE (A) 09/23/2016 1015   [place] NEGATIVE 09/23/2016 1015   KETONESUR NEGATIVE 09/23/2016 1015   PROTEINUR NEGATIVE 09/23/2016 1015   NITRITE NEGATIVE 09/23/2016 1015   LEUKOCYTESUR SMALL (A) 09/23/2016 1015   Sepsis Labs Invalid input(s): PROCALCITONIN,  WBC,  LACTICIDVEN Microbiology Recent Results (from the past 240 hour(s))  Culture, Urine     Status: None   Collection Time: 09/24/16  9:40 AM  Result Value Ref Range Status   Specimen Description URINE, RANDOM  Final   Special Requests NONE  Final   Culture   Final    NO GROWTH Performed at [facility], [place] [address], [place], [place] [address]    Report Status 09/25/2016 FINAL  Final  MRSA PCR Screening     Status: Abnormal   Collection Time: 09/27/16 10:28 AM  Result Value Ref Range Status   MRSA by PCR INVALID RESULTS, SPECIMEN SENT FOR CULTURE (A) NEGATIVE Final    Comment: RESULT CALLED TO, READ BACK BY AND VERIFIED WITH: PENNIX,A @ 1217 ON [id number] BY POTEAT,S        The GeneXpert MRSA Assay (FDA approved for NASAL specimens only), is one component of a comprehensive MRSA colonization surveillance program. It is not intended to diagnose MRSA infection nor to guide or monitor treatment for MRSA infections.   MRSA culture     Status: None   Collection Time: 09/27/16 10:28 AM  Result Value Ref Range Status   Specimen Description NOSE  Final   Special Requests NONE  Final   Culture   Final    NO MRSA DETECTED Performed at [facility], [address] [place], [place] [address]    Report Status 09/28/2016 FINAL  Final  Aerobic Culture (superficial specimen)     Status: None (Preliminary result)   Collection Time: 09/27/16  5:47 PM  Result Value Ref Range Status   Specimen Description WOUND  Final   Special Requests ABDOMINAL WALL ABSCESS  Final   Gram Stain   Final    ABUNDANT WBC PRESENT, PREDOMINANTLY PMN MODERATE GRAM POSITIVE COCCI IN PAIRS MODERATE GRAM POSITIVE  RODS MODERATE GRAM NEGATIVE RODS    Culture   Final    NO GROWTH 2 DAYS Performed at [facility], [place] [address] [place], [place] [address]    Report Status PENDING  Incomplete     Time coordinating discharge: Over 30 minutes  SIGNED:   Debbe Odea, MD  [facility] 09/30/2016, 10:37 AM Pager   If 7PM-7AM, please contact night-coverage [email or web address] Password [email or web address]

## 2016-10-07 MED FILL — ELIQUIS 5 MG TABLET: 5 | 30 days supply | Qty: 60 | Fill #0

## 2016-10-19 MED FILL — POTASSIUM CL ER 20 MEQ TABL: 20 | 30 days supply | Qty: 60 | Fill #0

## 2016-11-09 MED FILL — ELIQUIS 5 MG TABLET: 5 | 30 days supply | Qty: 60 | Fill #0

## 2016-11-09 MED FILL — FUROSEMIDE 80 MG TABLET: 80 | 30 days supply | Qty: 30 | Fill #0

## 2016-11-09 MED FILL — METOPROLOL TARTRATE 25 MG T: 25 | 30 days supply | Qty: 30 | Fill #0

## 2016-11-26 MED FILL — POTASSIUM CL ER 20 MEQ TABL: 20 | 30 days supply | Qty: 60 | Fill #1

## 2016-12-10 MED FILL — ELIQUIS 5 MG TABLET: 5 | 30 days supply | Qty: 60 | Fill #1

## 2016-12-12 ENCOUNTER — Emergency Department (HOSPITAL_BASED_OUTPATIENT_CLINIC_OR_DEPARTMENT_OTHER): Payer: 59

## 2016-12-12 ENCOUNTER — Emergency Department (HOSPITAL_BASED_OUTPATIENT_CLINIC_OR_DEPARTMENT_OTHER)
Admission: EM | Admit: 2016-12-12 | Discharge: 2016-12-13 | Disposition: A | Discharge status: Home / Self Care | Payer: 59 | Attending: Emergency Medicine | Admitting: Emergency Medicine

## 2016-12-12 ENCOUNTER — Encounter (HOSPITAL_BASED_OUTPATIENT_CLINIC_OR_DEPARTMENT_OTHER): Payer: Self-pay | Admitting: *Deleted

## 2016-12-12 DIAGNOSIS — M25562 Pain in left knee: Secondary | ICD-10-CM

## 2016-12-12 DIAGNOSIS — I130 Hypertensive heart and chronic kidney disease with heart failure and stage 1 through stage 4 chronic kidney disease, or unspecified chronic kidney disease: Secondary | ICD-10-CM | POA: Diagnosis not present

## 2016-12-12 DIAGNOSIS — Z7901 Long term (current) use of anticoagulants: Secondary | ICD-10-CM | POA: Diagnosis not present

## 2016-12-12 DIAGNOSIS — N183 Chronic kidney disease, stage 3 (moderate): Secondary | ICD-10-CM | POA: Insufficient documentation

## 2016-12-12 DIAGNOSIS — I82491 Acute embolism and thrombosis of other specified deep vein of right lower extremity: Secondary | ICD-10-CM | POA: Diagnosis not present

## 2016-12-12 DIAGNOSIS — Z79899 Other long term (current) drug therapy: Secondary | ICD-10-CM | POA: Diagnosis not present

## 2016-12-12 DIAGNOSIS — I5032 Chronic diastolic (congestive) heart failure: Secondary | ICD-10-CM | POA: Diagnosis not present

## 2016-12-12 DIAGNOSIS — I82409 Acute embolism and thrombosis of unspecified deep veins of unspecified lower extremity: Secondary | ICD-10-CM

## 2016-12-12 MED ORDER — OXYCODONE HCL 5 MG PO TABS
5.0000 mg | ORAL_TABLET | Freq: Once | ORAL | Status: AC
  Administered 2016-12-12: 5 mg via ORAL
  Filled 2016-12-12: qty 1

## 2016-12-12 MED ORDER — ACETAMINOPHEN 500 MG PO TABS
1000.0000 mg | ORAL_TABLET | Freq: Once | ORAL | Status: AC
  Administered 2016-12-12: 1000 mg via ORAL
  Filled 2016-12-12: qty 2

## 2016-12-12 MED ORDER — IBUPROFEN 400 MG PO TABS
600.0000 mg | ORAL_TABLET | Freq: Once | ORAL | Status: AC
  Administered 2016-12-12: 600 mg via ORAL
  Filled 2016-12-12: qty 1

## 2016-12-12 NOTE — ED Triage Notes (Signed)
Left knee pain with swelling x 2 days.  Hx of DVT.

## 2016-12-12 NOTE — Discharge Instructions (Signed)
Your leg ultrasound did not show a new clot or a clot in your left leg.  Your knee x-ray showed degenerative changes to your knee joint.   Please take ibuprofen or tylenol for knee pain.  Ice and rest for the next 48 hours to decrease inflammation.   Contact your primary care provider within 7 days if symptoms persist or worsen, he/she may recommend an MRI for further evaluation of your new knee pain.

## 2016-12-12 NOTE — ED Provider Notes (Signed)
[facility] DEPT [facility] Provider Note   CSN: [id number] Arrival date & time: 12/12/16  1709  By signing my name below, I, Marcello Moores, attest that this documentation has been prepared under the direction and in the presence of Carmon Sails, PA-C. Electronically Signed: Marcello Moores, ED Scribe. 12/12/16. 10:39 PM.  History   Chief Complaint Chief Complaint  Patient presents with  . Knee Pain   The history is provided by the patient. No language interpreter was used.   HPI Comments: Caitlin Taylor is a 46 y.o. female who presents to the Emergency Department complaining of sudden, gradually worsening left knee pain with associated difficulty ambulating that began yesterday. She notes that the knees "pop" when she climbs up stairs. Tylenol PMs provide mild relief of pain. Pt denies recent injury to the knee, falls or exertional activity prior to symptom onset. The pt denies recent prolonged travel. She does report surgery for abdominal wall cellulitis in 09/22/2016 where she was in bed for a few days. She additionally reports of a PMHx of PE and DVT in February 09/03/2016 where they prescribed her blood thinners, Eliquis (that she is currently prescribed and is taking).  Patient stopped taking estrogen-based medication in February. The pt has no associated fever, chest tightness, and SOB. She has chronic LE edema, symmetric without recent changes.  Past Medical History:  Diagnosis Date  . Chronic back pain   . CKD (chronic kidney disease) stage 2, GFR 60-89 ml/min   . DVT (deep venous thrombosis) ([place])   . Hypertension   . Lumbar stenosis   . Pulmonary embolism ([place])    a. diagnosed in 08/2016. Started on Eliquis    Patient Active Problem List   Diagnosis Date Noted  . Abdominal wall abscess 09/30/2016  . Right leg DVT ([place]) 09/30/2016  . AKI (acute kidney injury) ([place]) 09/22/2016  . Chronic diastolic CHF (congestive heart failure) ([place]) 09/22/2016  . Acute deep vein  thrombosis (DVT) of proximal vein of right lower extremity ([place])   . Edema 09/04/2016  . History of pulmonary embolus (PE) 09/03/2016  . Benign essential HTN   . Hypokalemia   . Itching   . Swelling   . CKD (chronic kidney disease), stage III   . Acute lower UTI   . Leukocytosis   . Lumbar myelopathy ([place]) 01/12/2016  . Low back pain   . Lumbar stenosis   . S/P lumbar fusion   . Post-operative pain   . Normocytic anemia   . Constipation due to pain medication   . Morbid obesity due to excess calories ([place])   . Back pain 01/05/2016  . Renal insufficiency 01/05/2016  . Hyperglycemia 01/05/2016  . Lumbar radiculopathy 01/05/2016  . UTI (urinary tract infection) 01/05/2016  . Essential hypertension 01/05/2016    Past Surgical History:  Procedure Laterality Date  . CESAREAN SECTION    . IRRIGATION AND DEBRIDEMENT ABSCESS N/A 09/27/2016   Procedure: IRRIGATION AND DEBRIDEMENT OF ABDOMINAL WALL ABSCESS;  Surgeon: Greer Pickerel, MD;  Location: [facility] ORS;  Service: General;  Laterality: N/A;  . MAXIMUM ACCESS (MAS)POSTERIOR LUMBAR INTERBODY FUSION (PLIF) 1 LEVEL N/A 01/06/2016   Procedure: FOR MAXIMUM ACCESS (MAS) POSTERIOR LUMBAR INTERBODY FUSION (PLIF) LUMBAR FOUR-FIVE;  Surgeon: Kevan Ny Ditty, MD;  Location: [facility]UR[facility] ORS;  Service: Neurosurgery;  Laterality: N/A;    OB History    No data available       Home Medications    Prior to Admission medications   Medication Sig Start  Date End Date Taking? Authorizing Provider  acetaminophen (TYLENOL) 500 MG tablet Take 500 mg by mouth every 6 (six) hours as needed (pain).    [provider]  apixaban (ELIQUIS) 5 MG TABS tablet Take 1 tablet (5 mg total) by mouth 2 (two) times daily. Please take 10 mg twice a day for 7 days and then 5 mg twice a day Patient taking differently: Take 5 mg by mouth 2 (two) times daily.  09/12/16   Rosita Fire, MD  bisacodyl (DULCOLAX) 5 MG EC tablet Take 1 tablet (5 mg total) by mouth  daily as needed for moderate constipation. 09/30/16   Debbe Odea, MD  furosemide (LASIX) 80 MG tablet Take 1 tablet (80 mg total) by mouth daily. 09/06/16   Rosita Fire, MD  HYDROcodone-acetaminophen (NORCO/VICODIN) 5-325 MG tablet Take 1-2 tablets by mouth every 4 (four) hours as needed for moderate pain. 09/30/16   Debbe Odea, MD  lisinopril-hydrochlorothiazide (PRINZIDE,ZESTORETIC) 20-25 MG tablet Take 1 tablet by mouth daily. 08/10/16   [provider]  metoprolol tartrate (LOPRESSOR) 25 MG tablet Take 0.5 tablets (12.5 mg total) by mouth 2 (two) times daily. 09/06/16   Rosita Fire, MD  polyethylene glycol [facility] / Floria Raveling) packet Take 17 g by mouth daily as needed for mild constipation. 09/30/16   Debbe Odea, MD  Vitamin D, Ergocalciferol, (DRISDOL) 50000 units CAPS capsule Take 50,000 Units by mouth every Monday. 08/09/16   [provider]    Family History Family History  Problem Relation Age of Onset  . Congestive Heart Failure Father     Social History Social History  Substance Use Topics  . Smoking status: Never Smoker  . Smokeless tobacco: Never Used  . Alcohol use No     Allergies   Patient has no known allergies.   Review of Systems Review of Systems  Constitutional: Negative for fever.  Respiratory: Negative for chest tightness and shortness of breath.   Cardiovascular: Positive for leg swelling.  Musculoskeletal: Positive for arthralgias, gait problem and joint swelling. Negative for myalgias.  Skin: Negative for color change.  Neurological: Negative for numbness.     Physical Exam Updated Vital Signs BP (!) 145/92 (BP Location: Right Arm)   Pulse (!) 56   Temp 98.2 F (36.8 C) (Oral)   Resp 18   SpO2 100%   Physical Exam  Constitutional: She is oriented to person, place, and time. She appears well-developed and well-nourished. No distress.  NAD.  HENT:  Head: Normocephalic and atraumatic.  Right Ear:  External ear normal.  Left Ear: External ear normal.  Nose: Nose normal.  Eyes: Conjunctivae and EOM are normal. No scleral icterus.  Neck: Normal range of motion. Neck supple.  Cardiovascular: Normal rate, regular rhythm and normal heart sounds.   No murmur heard. Pulses:      Popliteal pulses are 2+ on the right side, and 2+ on the left side.       Dorsalis pedis pulses are 2+ on the right side, and 2+ on the left side.       Posterior tibial pulses are 2+ on the right side, and 2+ on the left side.  Pulmonary/Chest: Effort normal and breath sounds normal. She has no wheezes.  Musculoskeletal: She exhibits no deformity.       Left knee: She exhibits decreased range of motion. Tenderness found.  +Pain with left knee flexion and extension. Calves are supple. No obvious deformity of knees including edema, erythema or effusion.  Full passive ROM of knees bilaterally with normal patellar J tracking bilaterally.  No medial or lateral joint line tenderness.   No bony tenderness over patella, fibular head or tibial tuberosity.   No tenderness over MCL, LCL, patellar tendon or quadriceps tendon.    Negative Lachman's. Negative posterior drawer test.  Negative McMurray's.  No varus or valgus laxity.  No crepitus with knee ROM.  Patient able to bear weight in ED (4+ steps)  Neurological: She is alert and oriented to person, place, and time.  5/5 strength with knee flexion and extension, bilaterally.  5/5 strength with ankle dorsiflexion and plantar flexion, bilaterally.  Sensation to light touch intact in the distribution of the obturator nerve, lateral cutaneous nerve, femoral nerve, common fibular nerve.  Feet: sensation to light touch intact in the distribution of the saphenous nerve, medial plantar nerve, lateral plantar nerve, bilaterally.   Skin: Skin is warm and dry. Capillary refill takes less than 2 seconds.  Psychiatric: She has a normal mood and affect. Her behavior is normal.  Judgment and thought content normal.  Nursing note and vitals reviewed.    ED Treatments / Results   DIAGNOSTIC STUDIES: Oxygen Saturation is 99% on RA, normal by my interpretation.   COORDINATION OF CARE: 10:28 PM-Discussed next steps with pt. Pt verbalized understanding and is agreeable with the plan.   Labs (all labs ordered are listed, but only abnormal results are displayed) Labs Reviewed - No data to display  EKG  EKG Interpretation None       Radiology US Venous Img Lower Bilateral  Result Date: 12/12/2016 CLINICAL DATA:  Bilateral calf swelling. Left worse over the last 2-3 weeks. EXAM: BILATERAL LOWER EXTREMITY VENOUS DOPPLER ULTRASOUND TECHNIQUE: Gray-scale sonography with graded compression, as well as color Doppler and duplex ultrasound were performed to evaluate the lower extremity deep venous systems from the level of the common femoral vein and including the common femoral, femoral, profunda femoral, popliteal and calf veins including the posterior tibial, peroneal and gastrocnemius veins when visible. The superficial great saphenous vein was also interrogated. Spectral Doppler was utilized to evaluate flow at rest and with distal augmentation maneuvers in the common femoral, femoral and popliteal veins. COMPARISON:  None. FINDINGS: RIGHT LOWER EXTREMITY Common Femoral Vein: No evidence of thrombus. Normal compressibility, respiratory phasicity and response to augmentation. Saphenofemoral Junction: No evidence of thrombus. Normal compressibility and flow on color Doppler imaging. Profunda Femoral Vein: No evidence of thrombus. Normal compressibility and flow on color Doppler imaging. Femoral Vein: No evidence of thrombus. Normal compressibility, respiratory phasicity and response to augmentation. Popliteal Vein: No evidence of thrombus. Normal compressibility, respiratory phasicity and response to augmentation. Calf Veins: Limited exam of the calf veins due to body habitus.  There is suggestion of a possible filling defect within the tibioperoneal trunk. Superficial Great Saphenous Vein: No evidence of thrombus. Normal compressibility and flow on color Doppler imaging. Venous Reflux:  None. Other Findings:  None. LEFT LOWER EXTREMITY Common Femoral Vein: No evidence of thrombus. Normal compressibility, respiratory phasicity and response to augmentation. Saphenofemoral Junction: No evidence of thrombus. Normal compressibility and flow on color Doppler imaging. Profunda Femoral Vein: No evidence of thrombus. Normal compressibility and flow on color Doppler imaging. Femoral Vein: No evidence of thrombus. Normal compressibility, respiratory phasicity and response to augmentation. Popliteal Vein: No evidence of thrombus. Normal compressibility, respiratory phasicity and response to augmentation. Calf Veins: No evidence of thrombus. Normal compressibility and flow on color Doppler imaging. Superficial Great Saphenous Vein:  No evidence of thrombus. Normal compressibility and flow on color Doppler imaging. Venous Reflux:  None. Other Findings:  None. IMPRESSION: Limited exam secondary to body habitus. There is suggestion of a thrombus within the right lower extremity calf veins at the level of the tibioperoneal trunk. No evidence for left lower extremity DVT. These results were called by telephone at the time of interpretation on 12/12/2016 at 6:59 pm to Dr. Marda Stalker , who verbally acknowledged these results. Electronically Signed   By: Lovey Newcomer M.D.   On: 12/12/2016 19:01   Dg Knee Complete 4 Views Left  Result Date: 12/12/2016 CLINICAL DATA:  Left knee pain and swelling for several hours EXAM: LEFT KNEE - COMPLETE 4+ VIEW COMPARISON:  None. FINDINGS: Tricompartmental degenerative changes are noted. No acute fracture or dislocation seen. Small joint effusion is noted. IMPRESSION: Degenerative change and small joint effusion. No acute bony abnormality is seen. Electronically  Signed   By: Inez Catalina M.D.   On: 12/12/2016 23:03    Procedures Procedures (including critical care time)  Medications Ordered in ED Medications  oxyCODONE (Oxy IR/ROXICODONE) immediate release tablet 5 mg (5 mg Oral Given 12/12/16 2241)  acetaminophen (TYLENOL) tablet 1,000 mg (1,000 mg Oral Given 12/12/16 2241)  ibuprofen (ADVIL,MOTRIN) tablet 600 mg (600 mg Oral Given 12/12/16 2241)     Initial Impression / Assessment and Plan / ED Course  I have reviewed the triage vital signs and the nursing notes.  Pertinent labs & imaging results that were available during my care of the patient were reviewed by me and considered in my medical decision making (see chart for details).  Clinical Course as of Jun 04 0002  Sun Dec 12, 2016  2319 IMPRESSION: Degenerative change and small joint effusion. No acute bony abnormality is seen.  [CG]    Clinical Course User Index [CG] Kinnie Feil, PA-C    46 yo female presents with sudden onset left knee pain, no trauma or falls. LLE is NVI.  X-ray suggestive of arthritis. No bony pathology. LE venous u/w showed DVT in RLE which was diagnosed months ago, patient is taking Eloquis. Today's U/S revealed NO new LLE DVT or baker's cyst. Pain likely from arthritis, cannot rule out soft tissue pathology without MRI. Will discharge patient with high dose NSAIDs, RICE and f/u with PCP for re-evaluation. Patient verbalized understanding and agreeable with plan.   Final Clinical Impressions(s) / ED Diagnoses   Final diagnoses:  DVT, lower extremity ([place])  Acute pain of left knee    New Prescriptions New Prescriptions   No medications on file   I personally performed the services described in this documentation, which was scribed in my presence. The recorded information has been reviewed and is accurate.     Kinnie Feil, PA-C 12/13/16 0002    Tegeler, Gwenyth Allegra, MD 12/13/16 [phone]

## 2016-12-13 MED FILL — METOPROLOL TARTRATE 25 MG T: 25 | 30 days supply | Qty: 30 | Fill #1

## 2017-01-10 MED FILL — METOPROLOL TARTRATE 25 MG T: 25 | 30 days supply | Qty: 30 | Fill #2

## 2017-01-10 MED FILL — ELIQUIS 5 MG TABLET: 5 | 30 days supply | Qty: 60 | Fill #2

## 2017-01-10 MED FILL — POTASSIUM CL ER 20 MEQ TABL: 20 | 30 days supply | Qty: 60 | Fill #2

## 2017-02-09 MED FILL — ELIQUIS 5 MG TABLET: 5 | 30 days supply | Qty: 60 | Fill #1

## 2017-02-09 MED FILL — METOPROLOL TARTRATE 25 MG T: 25 | 30 days supply | Qty: 30 | Fill #3

## 2017-02-09 MED FILL — FUROSEMIDE 80 MG TABLET: 80 | 30 days supply | Qty: 30 | Fill #1

## 2017-02-17 MED FILL — TORSEMIDE 100 MG TABLET: 100 | 30 days supply | Qty: 30 | Fill #0

## 2017-02-17 MED FILL — SPIRONOLACTONE 25 MG TABLET: 25 | 30 days supply | Qty: 30 | Fill #0

## 2017-03-09 MED FILL — ELIQUIS 5 MG TABLET: 5 | 30 days supply | Qty: 60 | Fill #0

## 2017-03-09 MED FILL — METOPROLOL TARTRATE 25 MG T: 25 | 30 days supply | Qty: 30 | Fill #0

## 2017-03-17 MED FILL — SPIRONOLACTONE 25 MG TABLET: 25 | 30 days supply | Qty: 30 | Fill #1

## 2017-03-17 MED FILL — TORSEMIDE 100 MG TABLET: 100 | 30 days supply | Qty: 30 | Fill #1

## 2017-04-08 MED FILL — ELIQUIS 5 MG TABLET: 5 | 30 days supply | Qty: 60 | Fill #1

## 2017-04-08 MED FILL — METOPROLOL TARTRATE 25 MG T: 25 | 30 days supply | Qty: 30 | Fill #1

## 2017-04-18 MED FILL — SPIRONOLACTONE 25 MG TABLET: 25 | 30 days supply | Qty: 30 | Fill #2

## 2017-04-18 MED FILL — TORSEMIDE 100 MG TABLET: 100 | 30 days supply | Qty: 30 | Fill #0 | Status: TO

## 2017-05-09 MED FILL — METOPROLOL TARTRATE 25 MG T: 25 | 30 days supply | Qty: 30 | Fill #2

## 2017-05-10 MED FILL — ELIQUIS 5 MG TABLET: 5 | 30 days supply | Qty: 60 | Fill #2

## 2017-05-18 ENCOUNTER — Other Ambulatory Visit: Payer: Self-pay | Admitting: Physician Assistant

## 2017-05-18 DIAGNOSIS — M4727 Other spondylosis with radiculopathy, lumbosacral region: Secondary | ICD-10-CM

## 2017-05-24 MED FILL — SPIRONOLACTONE 25 MG TABLET: 25 | 30 days supply | Qty: 30 | Fill #0 | Status: TO

## 2017-05-31 ENCOUNTER — Emergency Department (HOSPITAL_BASED_OUTPATIENT_CLINIC_OR_DEPARTMENT_OTHER)
Admission: EM | Admit: 2017-05-31 | Discharge: 2017-06-01 | Disposition: A | Discharge status: Home / Self Care | Payer: 59 | Attending: Emergency Medicine | Admitting: Emergency Medicine

## 2017-05-31 ENCOUNTER — Encounter (HOSPITAL_BASED_OUTPATIENT_CLINIC_OR_DEPARTMENT_OTHER): Payer: Self-pay | Admitting: *Deleted

## 2017-05-31 ENCOUNTER — Other Ambulatory Visit: Payer: Self-pay

## 2017-05-31 DIAGNOSIS — N182 Chronic kidney disease, stage 2 (mild): Secondary | ICD-10-CM | POA: Diagnosis not present

## 2017-05-31 DIAGNOSIS — M545 Low back pain: Secondary | ICD-10-CM | POA: Diagnosis present

## 2017-05-31 DIAGNOSIS — I5032 Chronic diastolic (congestive) heart failure: Secondary | ICD-10-CM | POA: Diagnosis not present

## 2017-05-31 DIAGNOSIS — M5432 Sciatica, left side: Secondary | ICD-10-CM | POA: Diagnosis not present

## 2017-05-31 DIAGNOSIS — I130 Hypertensive heart and chronic kidney disease with heart failure and stage 1 through stage 4 chronic kidney disease, or unspecified chronic kidney disease: Secondary | ICD-10-CM | POA: Insufficient documentation

## 2017-05-31 DIAGNOSIS — Z79899 Other long term (current) drug therapy: Secondary | ICD-10-CM | POA: Diagnosis not present

## 2017-05-31 DIAGNOSIS — Z7901 Long term (current) use of anticoagulants: Secondary | ICD-10-CM | POA: Insufficient documentation

## 2017-05-31 NOTE — ED Triage Notes (Signed)
Lower back pain with radiation into her left buttocks. Thinks she has sciatica.

## 2017-06-01 MED ORDER — PREDNISONE 20 MG PO TABS: 40.0000 mg | ORAL_TABLET | Freq: Every day | ORAL | 0 refills | Status: AC

## 2017-06-01 MED ORDER — ACETAMINOPHEN 325 MG PO TABS
650.0000 mg | ORAL_TABLET | Freq: Once | ORAL | Status: AC
  Administered 2017-06-01: 650 mg via ORAL
  Filled 2017-06-01: qty 2

## 2017-06-01 MED ORDER — PREDNISONE 50 MG PO TABS
60.0000 mg | ORAL_TABLET | Freq: Once | ORAL | Status: AC
  Administered 2017-06-01: 60 mg via ORAL
  Filled 2017-06-01: qty 1

## 2017-06-01 MED ORDER — CYCLOBENZAPRINE HCL 10 MG PO TABS: 10.0000 mg | ORAL_TABLET | Freq: Two times a day (BID) | ORAL | 0 refills | Status: DC | PRN

## 2017-06-01 NOTE — ED Provider Notes (Signed)
[facility] EMERGENCY DEPARTMENT Provider Note   CSN: [id number] Arrival date & time: 05/31/17  2000     History   Chief Complaint Chief Complaint  Patient presents with  . Back Pain    HPI  Caitlin Taylor is a 46 y.o. Female History of chronic back pain with previous L4-L5 fusion, hypertension, PE, and CKD, presents complaining of pain that starts in the left buttock, and occasionally shoots down the left leg. This pain has been present for the past 2-3 days. Patient reports she feels stiff, has been able to walk with some pain. Patient is concerned she might have sciatica. Patient describes pain as a dull ache with some burning, pain occasionally shoots down her leg with certain movements. Denies weakness, numbness, tingling, loss of bowel/bladder function or saddle anesthesia. History of IV drug use or cancer. Denies fevers or chills. Denies abdominal pain, or dysuria. Patient has been treating pain with bare back and body as well as Tylenol PM with some improvement.       Past Medical History:  Diagnosis Date  . Chronic back pain   . CKD (chronic kidney disease) stage 2, GFR 60-89 ml/min   . DVT (deep venous thrombosis) ([place])   . Hypertension   . Lumbar stenosis   . Pulmonary embolism ([place])    a. diagnosed in 08/2016. Started on Eliquis    Patient Active Problem List   Diagnosis Date Noted  . Abdominal wall abscess 09/30/2016  . Right leg DVT ([place]) 09/30/2016  . AKI (acute kidney injury) ([place]) 09/22/2016  . Chronic diastolic CHF (congestive heart failure) ([place]) 09/22/2016  . Acute deep vein thrombosis (DVT) of proximal vein of right lower extremity ([place])   . Edema 09/04/2016  . History of pulmonary embolus (PE) 09/03/2016  . Benign essential HTN   . Hypokalemia   . Itching   . Swelling   . CKD (chronic kidney disease), stage III ([place])   . Acute lower UTI   . Leukocytosis   . Lumbar myelopathy ([place]) 01/12/2016  . Low back pain   . Lumbar stenosis    . S/P lumbar fusion   . Post-operative pain   . Normocytic anemia   . Constipation due to pain medication   . Morbid obesity due to excess calories ([place])   . Back pain 01/05/2016  . Renal insufficiency 01/05/2016  . Hyperglycemia 01/05/2016  . Lumbar radiculopathy 01/05/2016  . UTI (urinary tract infection) 01/05/2016  . Essential hypertension 01/05/2016    Past Surgical History:  Procedure Laterality Date  . CESAREAN SECTION    . IRRIGATION AND DEBRIDEMENT ABSCESS N/A 09/27/2016   Procedure: IRRIGATION AND DEBRIDEMENT OF ABDOMINAL WALL ABSCESS;  Surgeon: Greer Pickerel, MD;  Location: [facility] ORS;  Service: General;  Laterality: N/A;  . MAXIMUM ACCESS (MAS)POSTERIOR LUMBAR INTERBODY FUSION (PLIF) 1 LEVEL N/A 01/06/2016   Procedure: FOR MAXIMUM ACCESS (MAS) POSTERIOR LUMBAR INTERBODY FUSION (PLIF) LUMBAR FOUR-FIVE;  Surgeon: Kevan Ny Ditty, MD;  Location: [facility];  Service: Neurosurgery;  Laterality: N/A;    OB History    No data available       Home Medications    Prior to Admission medications   Medication Sig Start Date End Date Taking? Authorizing Provider  acetaminophen (TYLENOL) 500 MG tablet Take 500 mg by mouth every 6 (six) hours as needed (pain).    [provider]  apixaban (ELIQUIS) 5 MG TABS tablet Take 1 tablet (5 mg total) by mouth 2 (two) times  daily. Please take 10 mg twice a day for 7 days and then 5 mg twice a day Patient taking differently: Take 5 mg by mouth 2 (two) times daily.  09/12/16   Rosita Fire, MD  bisacodyl (DULCOLAX) 5 MG EC tablet Take 1 tablet (5 mg total) by mouth daily as needed for moderate constipation. 09/30/16   Debbe Odea, MD  cyclobenzaprine (FLEXERIL) 10 MG tablet Take 1 tablet (10 mg total) by mouth 2 (two) times daily as needed for muscle spasms. 06/01/17   Jacqlyn Larsen, PA-C  furosemide (LASIX) 80 MG tablet Take 1 tablet (80 mg total) by mouth daily. 09/06/16   Rosita Fire, MD    HYDROcodone-acetaminophen (NORCO/VICODIN) 5-325 MG tablet Take 1-2 tablets by mouth every 4 (four) hours as needed for moderate pain. 09/30/16   Debbe Odea, MD  lisinopril-hydrochlorothiazide (PRINZIDE,ZESTORETIC) 20-25 MG tablet Take 1 tablet by mouth daily. 08/10/16   [provider]  metoprolol tartrate (LOPRESSOR) 25 MG tablet Take 0.5 tablets (12.5 mg total) by mouth 2 (two) times daily. 09/06/16   Rosita Fire, MD  polyethylene glycol [facility] / Floria Raveling) packet Take 17 g by mouth daily as needed for mild constipation. 09/30/16   Debbe Odea, MD  predniSONE (DELTASONE) 20 MG tablet Take 2 tablets (40 mg total) by mouth daily for 5 days. 06/01/17 06/06/17  Jacqlyn Larsen, PA-C  Vitamin D, Ergocalciferol, (DRISDOL) 50000 units CAPS capsule Take 50,000 Units by mouth every Monday. 08/09/16   [provider]    Family History Family History  Problem Relation Age of Onset  . Congestive Heart Failure Father     Social History Social History   Tobacco Use  . Smoking status: Never Smoker  . Smokeless tobacco: Never Used  Substance Use Topics  . Alcohol use: No  . Drug use: No     Allergies   Patient has no known allergies.   Review of Systems Review of Systems  Constitutional: Negative for chills and fever.  Eyes: Negative for visual disturbance.  Respiratory: Negative for chest tightness and shortness of breath.   Cardiovascular: Negative for chest pain.  Gastrointestinal: Negative for abdominal pain, constipation, nausea and vomiting.  Genitourinary: Negative for difficulty urinating and dysuria.  Musculoskeletal: Positive for back pain and myalgias. Negative for arthralgias, gait problem, joint swelling, neck pain and neck stiffness.  Skin: Negative for color change and rash.  Neurological: Negative for weakness and numbness.     Physical Exam Updated Vital Signs BP (!) 160/90   Pulse 75   Temp 99.7 F (37.6 C) (Oral)   Resp 18   Ht 5'  3" (1.6 m)   Wt 125.2 kg (276 lb)   LMP 05/17/2017   SpO2 99%   BMI 48.89 kg/m   Physical Exam  Constitutional: She appears well-developed and well-nourished. No distress.  HENT:  Head: Normocephalic and atraumatic.  Eyes: Right eye exhibits no discharge. Left eye exhibits no discharge.  Pulmonary/Chest: Effort normal. No respiratory distress.  Musculoskeletal:  Mild tenderness over the left buttock, L-spine nontender at midline, no tenderness over the right side of the low back, motion of the lower extremities causes ome increase in pain, primarily with raising the left leg.  Neurological: She is alert. Coordination normal.  Speech is clear, able to follow commands Normal strength in upper and lower extremities bilaterally, 5/5 strength in proximal and distal lower extremity, and plantar and dorsiflexion DTRs 2+ in bilateral lower extremities Sensation normal to light and  sharp touch Moves extremities without ataxia, coordination intact    Skin: Skin is warm and dry. Capillary refill takes less than 2 seconds. She is not diaphoretic.  Psychiatric: She has a normal mood and affect. Her behavior is normal.  Nursing note and vitals reviewed.    ED Treatments / Results  Labs (all labs ordered are listed, but only abnormal results are displayed) Labs Reviewed - No data to display  EKG  EKG Interpretation None       Radiology No results found.  Procedures Procedures (including critical care time)  Medications Ordered in ED Medications  acetaminophen (TYLENOL) tablet 650 mg (650 mg Oral Given 06/01/17 0022)  predniSONE (DELTASONE) tablet 60 mg (60 mg Oral Given 06/01/17 0022)     Initial Impression / Assessment and Plan / ED Course  I have reviewed the triage vital signs and the nursing notes.  Pertinent labs & imaging results that were available during my care of the patient were reviewed by me and considered in my medical decision making (see chart for  details).  Normal neurological exam, no evidence of urinary incontinence or retention. Patient can walk with minimal pain.  No loss of bowel or bladder control.  No concern for cauda equina.  No fever, night sweats, weight loss, h/o cancer, IVDU.  Pain treated here in the department with adequate improvement. RICE protocol and pain medicine indicated, will also give short course of steroids. Counseled pt on heat therapy as well as ROM exercises. I have also discussed reasons to return immediately to the ER.  Patient expresses understanding and agrees with plan.  Final Clinical Impressions(s) / ED Diagnoses   Final diagnoses:  Sciatica of left side    ED Discharge Orders        Ordered    predniSONE (DELTASONE) 20 MG tablet  Daily     06/01/17 0020    cyclobenzaprine (FLEXERIL) 10 MG tablet  2 times daily PRN     06/01/17 0020       Jacqlyn Larsen, PA-C 06/01/17 0054    Molpus, Jenny Reichmann, MD 06/01/17 [phone]

## 2017-06-01 NOTE — Discharge Instructions (Signed)
Continue using Tylenol for pain, you may also use Flexeril to help ease muscle spasms, this medication can make you drowsy so please take it at night. Please complete 5 day course of prednisone. If symptoms are not improving follow-up with your spine doctor. If you have numbness, weakness or tingling in your legs, difficulty walking or loss of bowel or bladder control return to the ED for reevaluation.

## 2017-06-06 ENCOUNTER — Emergency Department (HOSPITAL_BASED_OUTPATIENT_CLINIC_OR_DEPARTMENT_OTHER)
Admission: EM | Admit: 2017-06-06 | Discharge: 2017-06-06 | Disposition: A | Discharge status: Home / Self Care | Payer: 59 | Attending: Emergency Medicine | Admitting: Emergency Medicine

## 2017-06-06 ENCOUNTER — Ambulatory Visit
Admission: RE | Admit: 2017-06-06 | Discharge: 2017-06-06 | Disposition: A | Discharge status: Home / Self Care | Payer: 59 | Source: Ambulatory Visit | Attending: Physician Assistant | Admitting: Physician Assistant

## 2017-06-06 ENCOUNTER — Other Ambulatory Visit: Payer: Self-pay

## 2017-06-06 ENCOUNTER — Encounter (HOSPITAL_BASED_OUTPATIENT_CLINIC_OR_DEPARTMENT_OTHER): Payer: Self-pay

## 2017-06-06 DIAGNOSIS — Z7901 Long term (current) use of anticoagulants: Secondary | ICD-10-CM | POA: Insufficient documentation

## 2017-06-06 DIAGNOSIS — Z79899 Other long term (current) drug therapy: Secondary | ICD-10-CM | POA: Insufficient documentation

## 2017-06-06 DIAGNOSIS — M5442 Lumbago with sciatica, left side: Secondary | ICD-10-CM | POA: Diagnosis not present

## 2017-06-06 DIAGNOSIS — I130 Hypertensive heart and chronic kidney disease with heart failure and stage 1 through stage 4 chronic kidney disease, or unspecified chronic kidney disease: Secondary | ICD-10-CM | POA: Insufficient documentation

## 2017-06-06 DIAGNOSIS — E876 Hypokalemia: Secondary | ICD-10-CM | POA: Diagnosis not present

## 2017-06-06 DIAGNOSIS — M4727 Other spondylosis with radiculopathy, lumbosacral region: Secondary | ICD-10-CM

## 2017-06-06 DIAGNOSIS — I5032 Chronic diastolic (congestive) heart failure: Secondary | ICD-10-CM | POA: Diagnosis not present

## 2017-06-06 DIAGNOSIS — N182 Chronic kidney disease, stage 2 (mild): Secondary | ICD-10-CM | POA: Diagnosis not present

## 2017-06-06 DIAGNOSIS — R0602 Shortness of breath: Secondary | ICD-10-CM | POA: Diagnosis present

## 2017-06-06 LAB — CBC
HEMATOCRIT: 35.7 % — AB (ref 36.0–46.0)
Hemoglobin: 12.2 g/dL (ref 12.0–15.0)
MCH: 29.5 pg (ref 26.0–34.0)
MCHC: 34.2 g/dL (ref 30.0–36.0)
MCV: 86.4 fL (ref 78.0–100.0)
PLATELETS: 274 10*3/uL (ref 150–400)
RBC: 4.13 MIL/uL (ref 3.87–5.11)
RDW: 14.1 % (ref 11.5–15.5)
WBC: 19.2 10*3/uL — AB (ref 4.0–10.5)

## 2017-06-06 LAB — BASIC METABOLIC PANEL
ANION GAP: 10 (ref 5–15)
BUN: 30 mg/dL — ABNORMAL HIGH (ref 6–20)
CALCIUM: 8.3 mg/dL — AB (ref 8.9–10.3)
CO2: 31 mmol/L (ref 22–32)
Chloride: 99 mmol/L — ABNORMAL LOW (ref 101–111)
Creatinine, Ser: 1.71 mg/dL — ABNORMAL HIGH (ref 0.44–1.00)
GFR, EST AFRICAN AMERICAN: 40 mL/min — AB (ref >60)
GFR, EST NON AFRICAN AMERICAN: 35 mL/min — AB (ref >60)
Glucose, Bld: 118 mg/dL — ABNORMAL HIGH (ref 65–99)
POTASSIUM: 3 mmol/L — AB (ref 3.5–5.1)
Sodium: 140 mmol/L (ref 135–145)

## 2017-06-06 MED ORDER — POTASSIUM CHLORIDE ER 10 MEQ PO TBCR: 10.0000 meq | EXTENDED_RELEASE_TABLET | Freq: Every day | ORAL | 0 refills | Status: DC

## 2017-06-06 MED ORDER — GADOBENATE DIMEGLUMINE 529 MG/ML IV SOLN
10.0000 mL | Freq: Once | INTRAVENOUS | Status: AC | PRN
  Administered 2017-06-06: 10 mL via INTRAVENOUS

## 2017-06-06 NOTE — Discharge Instructions (Signed)
Continue to take all your at home medications as prescribed. Start taking potassium once a day for the next 10 days. Follow-up with your primary care doctor and your back doctor if your symptoms are not improving or if you feel you are unable to return to work. Return to the emergency room if you develop chest pain, worsening shortness of breath, difficulty breathing, worsening leg swelling, or any new or concerning symptoms.

## 2017-06-06 NOTE — ED Provider Notes (Signed)
[facility] EMERGENCY DEPARTMENT Provider Note   CSN: [id number] Arrival date & time: 06/06/17  1820     History   Chief Complaint Chief Complaint  Patient presents with  . Shortness of Breath    HPI Caitlin Taylor is a 46 y.o. female presenting to the emergency room for a work note.  Pt states that she was seen recently for back pain, and tx has been working. However, she states that she is still in a lot of pain, and feels she cannot go back to work. She was walking from the car quickly, and was having increased back pain when she first arrived. She states the RN was concerned about her DOE/SOB.  She has not felt SOB recently. She states that she would not have brought it up if not encouraged to by the RN or if she was not already at the hospital. She states she does not need any new medications or refills for her back pain. She is no a blood thinner from PE dx in January, and states she is taking it as prescribed. She denies increased leg swelling or pain, recent surgeries, recent travel, recent immobilization or h/o CA. She denies fevers, chills, congestion, ST, cough, CP, N/V, abd pain, urinary sxs or abnormal BMs. She has had no change in medications recently.   HPI  Past Medical History:  Diagnosis Date  . Chronic back pain   . CKD (chronic kidney disease) stage 2, GFR 60-89 ml/min   . DVT (deep venous thrombosis) ([place])   . Hypertension   . Lumbar stenosis   . Pulmonary embolism ([place])    a. diagnosed in 08/2016. Started on Eliquis    Patient Active Problem List   Diagnosis Date Noted  . Abdominal wall abscess 09/30/2016  . Right leg DVT ([place]) 09/30/2016  . AKI (acute kidney injury) ([place]) 09/22/2016  . Chronic diastolic CHF (congestive heart failure) ([place]) 09/22/2016  . Acute deep vein thrombosis (DVT) of proximal vein of right lower extremity ([place])   . Edema 09/04/2016  . History of pulmonary embolus (PE) 09/03/2016  . Benign essential HTN   .  Hypokalemia   . Itching   . Swelling   . CKD (chronic kidney disease), stage III ([place])   . Acute lower UTI   . Leukocytosis   . Lumbar myelopathy ([place]) 01/12/2016  . Low back pain   . Lumbar stenosis   . S/P lumbar fusion   . Post-operative pain   . Normocytic anemia   . Constipation due to pain medication   . Morbid obesity due to excess calories ([place])   . Back pain 01/05/2016  . Renal insufficiency 01/05/2016  . Hyperglycemia 01/05/2016  . Lumbar radiculopathy 01/05/2016  . UTI (urinary tract infection) 01/05/2016  . Essential hypertension 01/05/2016    Past Surgical History:  Procedure Laterality Date  . CESAREAN SECTION    . IRRIGATION AND DEBRIDEMENT ABSCESS N/A 09/27/2016   Procedure: IRRIGATION AND DEBRIDEMENT OF ABDOMINAL WALL ABSCESS;  Surgeon: Greer Pickerel, MD;  Location: [facility];  Service: General;  Laterality: N/A;  . MAXIMUM ACCESS (MAS)POSTERIOR LUMBAR INTERBODY FUSION (PLIF) 1 LEVEL N/A 01/06/2016   Procedure: FOR MAXIMUM ACCESS (MAS) POSTERIOR LUMBAR INTERBODY FUSION (PLIF) LUMBAR FOUR-FIVE;  Surgeon: Kevan Ny Ditty, MD;  Location: [facility];  Service: Neurosurgery;  Laterality: N/A;    OB History    No data available       Home Medications    Prior to Admission medications  Medication Sig Start Date End Date Taking? Authorizing Provider  acetaminophen (TYLENOL) 500 MG tablet Take 500 mg by mouth every 6 (six) hours as needed (pain).    [provider]  apixaban (ELIQUIS) 5 MG TABS tablet Take 1 tablet (5 mg total) by mouth 2 (two) times daily. Please take 10 mg twice a day for 7 days and then 5 mg twice a day Patient taking differently: Take 5 mg by mouth 2 (two) times daily.  09/12/16   Rosita Fire, MD  bisacodyl (DULCOLAX) 5 MG EC tablet Take 1 tablet (5 mg total) by mouth daily as needed for moderate constipation. 09/30/16   Debbe Odea, MD  cyclobenzaprine (FLEXERIL) 10 MG tablet Take 1 tablet (10 mg total) by mouth 2 (two)  times daily as needed for muscle spasms. 06/01/17   Jacqlyn Larsen, PA-C  furosemide (LASIX) 80 MG tablet Take 1 tablet (80 mg total) by mouth daily. 09/06/16   Rosita Fire, MD  HYDROcodone-acetaminophen (NORCO/VICODIN) 5-325 MG tablet Take 1-2 tablets by mouth every 4 (four) hours as needed for moderate pain. 09/30/16   Debbe Odea, MD  lisinopril-hydrochlorothiazide (PRINZIDE,ZESTORETIC) 20-25 MG tablet Take 1 tablet by mouth daily. 08/10/16   [provider]  metoprolol tartrate (LOPRESSOR) 25 MG tablet Take 0.5 tablets (12.5 mg total) by mouth 2 (two) times daily. 09/06/16   Rosita Fire, MD  polyethylene glycol [facility] / Floria Raveling) packet Take 17 g by mouth daily as needed for mild constipation. 09/30/16   Debbe Odea, MD  potassium chloride (K-DUR) 10 MEQ tablet Take 1 tablet (10 mEq total) by mouth daily for 10 days. 06/06/17 06/16/17  Jodelle Fausto, PA-C  Vitamin D, Ergocalciferol, (DRISDOL) 50000 units CAPS capsule Take 50,000 Units by mouth every Monday. 08/09/16   [provider]    Family History Family History  Problem Relation Age of Onset  . Congestive Heart Failure Father     Social History Social History   Tobacco Use  . Smoking status: Never Smoker  . Smokeless tobacco: Never Used  Substance Use Topics  . Alcohol use: No  . Drug use: No     Allergies   Patient has no known allergies.   Review of Systems Review of Systems  Constitutional: Negative for chills and fever.  HENT: Negative for congestion and sore throat.   Respiratory: Positive for shortness of breath (with exertion x1). Negative for cough and chest tightness.   Cardiovascular: Negative for chest pain, palpitations and leg swelling.  Gastrointestinal: Negative for abdominal pain, nausea and vomiting.  Musculoskeletal: Positive for back pain (unchanged).     Physical Exam Updated Vital Signs BP 105/72 (BP Location: Right Arm)   Pulse 95   Temp 98.3 F  (36.8 C) (Oral)   Resp 20   Wt 125 kg (275 lb 9.2 oz)   LMP 05/17/2017   SpO2 96%   BMI 48.82 kg/m   Physical Exam  Constitutional: She is oriented to person, place, and time. She appears well-developed and well-nourished. No distress.  HENT:  Head: Normocephalic and atraumatic.  Eyes: EOM are normal. Pupils are equal, round, and reactive to light.  Neck: Normal range of motion.  Cardiovascular: Normal rate, regular rhythm and intact distal pulses.  Pulmonary/Chest: Effort normal and breath sounds normal. No stridor. No respiratory distress. She has no wheezes. She has no rales. She exhibits no tenderness.  Abdominal: Soft. She exhibits no distension. There is no tenderness. There is no guarding.  Musculoskeletal:  TTP of L lower back. Pt ambulatory. Pedal pulses intact bilaterally. Bilateral leg edema, R>L, baseline per pt  Neurological: She is alert and oriented to person, place, and time.  Skin: Skin is warm and dry.  Psychiatric: She has a normal mood and affect.  Nursing note and vitals reviewed.    ED Treatments / Results  Labs (all labs ordered are listed, but only abnormal results are displayed) Labs Reviewed  CBC - Abnormal; Notable for the following components:      Result Value   WBC 19.2 (*)    HCT 35.7 (*)    All other components within normal limits  BASIC METABOLIC PANEL - Abnormal; Notable for the following components:   Potassium 3.0 (*)    Chloride 99 (*)    Glucose, Bld 118 (*)    BUN 30 (*)    Creatinine, Ser 1.71 (*)    Calcium 8.3 (*)    GFR calc non Af Amer 35 (*)    GFR calc Af Amer 40 (*)    All other components within normal limits    EKG  EKG Interpretation None       Radiology Ct Lumbar Spine Wo Contrast  Result Date: 06/06/2017 CLINICAL DATA:  Spondylosis with radiculopathy, lumbosacral. Spinal surgery 01/07/2016 EXAM: CT LUMBAR SPINE WITHOUT CONTRAST TECHNIQUE: Multidetector CT imaging of the lumbar spine was performed without  intravenous contrast administration. Multiplanar CT image reconstructions were also generated. COMPARISON:  Lumbar spine MRI 01/05/2016 FINDINGS: Segmentation: 5 lumbar type vertebrae. Alignment: Normal. Vertebrae: L4-5 discectomy with posterior rod and pedicle screw fixation. The intervertebral cage is flush against the posterior cortex of L4 and L5, stable from intraoperative fluoroscopy. Subsidence greater at the L5 superior endplate that is mild and stable from 2017. Bone graft about posterior elements without solid bridging bone. On coronal reformats there may be bony bridging through the intervertebral cage, with intervertebral arthrodesis not seen elsewhere. There is no evidence of hardware loosening. Negative for fracture. Paraspinal and other soft tissues: Negative. Disc levels: Postoperative L4-5 level as above. The spinal canal appears widely patent after posterior decompression. Patent foramina. No adjacent segment disease or impingement noted elsewhere. IMPRESSION: 1. L4-5 PLIF. No evidence of hardware failure or loosening; stable cage positioning and mild subsidence compared to 2017. On coronal reformats there is questionable bony bridging through the intervertebral cage. Solid arthrodesis is not seen elsewhere. 2. No adjacent segment disease or evidence of impingement. Electronically Signed   By: Monte Fantasia M.D.   On: 06/06/2017 19:45   Mr Lumbar Spine W Wo Contrast  Result Date: 06/06/2017 CLINICAL DATA:  Spondylosis with radiculopathy. Low back pain. Bilateral feet pain. EXAM: MRI LUMBAR SPINE WITHOUT AND WITH CONTRAST TECHNIQUE: Multiplanar and multiecho pulse sequences of the lumbar spine were obtained without and with intravenous contrast. Creatinine was obtained on site at [facility] at [address] Results: Creatinine 1.7 mg/dL. Patient has known chronic kidney disease. CONTRAST:  46mL MULTIHANCE GADOBENATE DIMEGLUMINE 529 MG/ML IV SOLN COMPARISON:  01/05/2016  FINDINGS: Segmentation:  Standard. Alignment:  Physiologic. Vertebrae:  No fracture, evidence of discitis, or bone lesion. Conus medullaris and cauda equina: Conus extends to the L2 level. Conus and cauda equina appear normal. Paraspinal and other soft tissues: Intramural hypointense masses in the uterus consistent with fibroids, up to 2.6 cm. Three fibroids are seen in the partially visualized uterus. Disc levels: T12- L1: Unremarkable. L1-L2: Unremarkable. L2-L3: Unremarkable. L3-L4: Minimal bilateral joint fluid without definite spurring (there is adjacent  bone graft based on CT). No herniation or impingement L4-L5: PLIF and posterior decompression. The spinal canal is widely patent. Perineural fat about the left foraminal L4 nerve is less well visualized than on the right. This is likely combination of artifact, bone graft seen on CT, and fibrosis. No compressive stenosis is suspected when correlated with prior CT. L5-S1:Unremarkable. IMPRESSION: 1. Diffusely patent spinal canal. 2. L4-5 interval PLIF. Degree of left foraminal narrowing but no compressive stenosis suspected when correlated with contemporaneous CT. 3. No interval adjacent segment disease. 4. Fibroid uterus. Electronically Signed   By: Monte Fantasia M.D.   On: 06/06/2017 20:57    Procedures Procedures (including critical care time)  Medications Ordered in ED Medications - No data to display   Initial Impression / Assessment and Plan / ED Course  I have reviewed the triage vital signs and the nursing notes.  Pertinent labs & imaging results that were available during my care of the patient were reviewed by me and considered in my medical decision making (see chart for details).     Patient presenting for work note due to continued back pain.  Incidentally, she was found to be short of breath when going from the parking lot to the ER.  She denies shortness of breath or dyspnea on exertion.  Physical exam reassuring, no new leg  pain or swelling, no cough, no chest pain.  Patient slightly tachycardic, which improved when she sat down and rested.  Patient is obese, and likely is deconditioned.  She is taking her blood thinners as prescribed, no new risk factors.  She states that she would not have come in to be seen for this shortness of breath.  Will obtain basic labs, but doubt PE, CHF exacerbation, ACS, or infection as cause of sxs.  Labs show pt with hypokalemia. Slight leukocytosis, possible viral illness. Will rx potassium, and give work note. Pt to f/u with neurosurgeon and PCP as needed. At this time, pt appears safe for discharge. Return precautions given. Pt states she understands and agrees to plan.    Final Clinical Impressions(s) / ED Diagnoses   Final diagnoses:  Left-sided low back pain with left-sided sciatica, unspecified chronicity  Hypokalemia    ED Discharge Orders        Ordered    potassium chloride (K-DUR) 10 MEQ tablet  Daily     06/06/17 2041       Franchot Heidelberg, PA-C 06/07/17 0111    [name], MD 06/08/17 1552

## 2017-06-06 NOTE — ED Triage Notes (Addendum)
C/o SOB started approx 3pm while pt was en route to imaging center to have studies for back pain-states she was coming here to get an extension of RTW for back pain that she was seen here for recently-no resp distress noted at this time-pt states she had DOE walking from parking lot

## 2017-06-07 MED FILL — ELIQUIS 5 MG TABLET: 5 | 30 days supply | Qty: 60 | Fill #3

## 2017-06-07 MED FILL — METOPROLOL TARTRATE 25 MG T: 25 | 30 days supply | Qty: 30 | Fill #3

## 2017-07-11 MED FILL — ELIQUIS 5 MG TABLET: 5 | 30 days supply | Qty: 60 | Fill #4

## 2017-07-11 MED FILL — METOPROLOL TARTRATE 25 MG T: 25 | 30 days supply | Qty: 30 | Fill #4

## 2017-07-14 DIAGNOSIS — G4730 Sleep apnea, unspecified: Secondary | ICD-10-CM | POA: Diagnosis not present

## 2017-07-15 DIAGNOSIS — Z23 Encounter for immunization: Secondary | ICD-10-CM | POA: Diagnosis not present

## 2017-08-01 DIAGNOSIS — G4733 Obstructive sleep apnea (adult) (pediatric): Secondary | ICD-10-CM | POA: Diagnosis not present

## 2017-08-08 DIAGNOSIS — R600 Localized edema: Secondary | ICD-10-CM | POA: Diagnosis not present

## 2017-08-08 DIAGNOSIS — M722 Plantar fascial fibromatosis: Secondary | ICD-10-CM | POA: Diagnosis not present

## 2017-08-09 MED FILL — ELIQUIS 5 MG TABLET: 5 | 30 days supply | Qty: 60 | Fill #5

## 2017-08-09 MED FILL — METOPROLOL TARTRATE 25 MG T: 25 | 30 days supply | Qty: 30 | Fill #5

## 2017-08-10 DIAGNOSIS — M24571 Contracture, right ankle: Secondary | ICD-10-CM | POA: Diagnosis not present

## 2017-08-10 DIAGNOSIS — M722 Plantar fascial fibromatosis: Secondary | ICD-10-CM | POA: Diagnosis not present

## 2017-08-10 DIAGNOSIS — M2041 Other hammer toe(s) (acquired), right foot: Secondary | ICD-10-CM | POA: Diagnosis not present

## 2017-08-19 ENCOUNTER — Encounter (HOSPITAL_BASED_OUTPATIENT_CLINIC_OR_DEPARTMENT_OTHER): Payer: Self-pay | Admitting: *Deleted

## 2017-08-19 ENCOUNTER — Other Ambulatory Visit: Payer: Self-pay

## 2017-08-19 ENCOUNTER — Emergency Department (HOSPITAL_BASED_OUTPATIENT_CLINIC_OR_DEPARTMENT_OTHER)
Admission: EM | Admit: 2017-08-19 | Discharge: 2017-08-19 | Disposition: A | Discharge status: Home / Self Care | Payer: 59 | Attending: Emergency Medicine | Admitting: Emergency Medicine

## 2017-08-19 ENCOUNTER — Emergency Department (HOSPITAL_BASED_OUTPATIENT_CLINIC_OR_DEPARTMENT_OTHER): Payer: 59

## 2017-08-19 DIAGNOSIS — Z79899 Other long term (current) drug therapy: Secondary | ICD-10-CM | POA: Diagnosis not present

## 2017-08-19 DIAGNOSIS — M79672 Pain in left foot: Secondary | ICD-10-CM | POA: Insufficient documentation

## 2017-08-19 DIAGNOSIS — Z7901 Long term (current) use of anticoagulants: Secondary | ICD-10-CM | POA: Diagnosis not present

## 2017-08-19 DIAGNOSIS — I130 Hypertensive heart and chronic kidney disease with heart failure and stage 1 through stage 4 chronic kidney disease, or unspecified chronic kidney disease: Secondary | ICD-10-CM | POA: Insufficient documentation

## 2017-08-19 DIAGNOSIS — N183 Chronic kidney disease, stage 3 (moderate): Secondary | ICD-10-CM | POA: Insufficient documentation

## 2017-08-19 DIAGNOSIS — I5032 Chronic diastolic (congestive) heart failure: Secondary | ICD-10-CM | POA: Diagnosis not present

## 2017-08-19 MED ORDER — HYDROCODONE-ACETAMINOPHEN 5-325 MG PO TABS: 1.0000 | ORAL_TABLET | Freq: Four times a day (QID) | ORAL | 0 refills | Status: DC | PRN

## 2017-08-19 NOTE — Discharge Instructions (Signed)
As discussed, take pain medications and follow up with Dr. Barbaraann Barthel and your primary care provider.   Return if symptoms worsen, fever, chills, increased swelling or other new concerning symptoms in the meantime.

## 2017-08-19 NOTE — ED Triage Notes (Signed)
Pain in her left foot for 2 weeks. Last week she had pain in her right foot.

## 2017-08-20 NOTE — ED Provider Notes (Signed)
[facility] EMERGENCY DEPARTMENT Provider Note   CSN: [id number] Arrival date & time: 08/19/17  1744     History   Chief Complaint Chief Complaint  Patient presents with  . Foot Pain    HPI Caitlin Taylor is a 47 y.o. female with past medical history significant for chronic back pain, CKD stage II, DVT of the right lower extremity and PE, lumbar stenosis and hypertension presenting with progressive onset 1 week of left foot pain mainly on the medial aspect of the first metatarsophalangeal joint.  Reports pain in the entire left foot whenever she bears weight on the left foot.  HPI  Past Medical History:  Diagnosis Date  . Chronic back pain   . CKD (chronic kidney disease) stage 2, GFR 60-89 ml/min   . DVT (deep venous thrombosis) ([place])   . Hypertension   . Lumbar stenosis   . Pulmonary embolism ([place])    a. diagnosed in 08/2016. Started on Eliquis    Patient Active Problem List   Diagnosis Date Noted  . Abdominal wall abscess 09/30/2016  . Right leg DVT ([place]) 09/30/2016  . AKI (acute kidney injury) ([place]) 09/22/2016  . Chronic diastolic CHF (congestive heart failure) ([place]) 09/22/2016  . Acute deep vein thrombosis (DVT) of proximal vein of right lower extremity ([place])   . Edema 09/04/2016  . History of pulmonary embolus (PE) 09/03/2016  . Benign essential HTN   . Hypokalemia   . Itching   . Swelling   . CKD (chronic kidney disease), stage III ([place])   . Acute lower UTI   . Leukocytosis   . Lumbar myelopathy ([place]) 01/12/2016  . Low back pain   . Lumbar stenosis   . S/P lumbar fusion   . Post-operative pain   . Normocytic anemia   . Constipation due to pain medication   . Morbid obesity due to excess calories ([place])   . Back pain 01/05/2016  . Renal insufficiency 01/05/2016  . Hyperglycemia 01/05/2016  . Lumbar radiculopathy 01/05/2016  . UTI (urinary tract infection) 01/05/2016  . Essential hypertension 01/05/2016    Past Surgical History:    Procedure Laterality Date  . CESAREAN SECTION    . IRRIGATION AND DEBRIDEMENT ABSCESS N/A 09/27/2016   Procedure: IRRIGATION AND DEBRIDEMENT OF ABDOMINAL WALL ABSCESS;  Surgeon: Greer Pickerel, MD;  Location: [facility];  Service: General;  Laterality: N/A;  . MAXIMUM ACCESS (MAS)POSTERIOR LUMBAR INTERBODY FUSION (PLIF) 1 LEVEL N/A 01/06/2016   Procedure: FOR MAXIMUM ACCESS (MAS) POSTERIOR LUMBAR INTERBODY FUSION (PLIF) LUMBAR FOUR-FIVE;  Surgeon: Kevan Ny Ditty, MD;  Location: [facility];  Service: Neurosurgery;  Laterality: N/A;    OB History    No data available       Home Medications    Prior to Admission medications   Medication Sig Start Date End Date Taking? Authorizing Provider  acetaminophen (TYLENOL) 500 MG tablet Take 500 mg by mouth every 6 (six) hours as needed (pain).    [provider]  apixaban (ELIQUIS) 5 MG TABS tablet Take 1 tablet (5 mg total) by mouth 2 (two) times daily. Please take 10 mg twice a day for 7 days and then 5 mg twice a day Patient taking differently: Take 5 mg by mouth 2 (two) times daily.  09/12/16   Rosita Fire, MD  bisacodyl (DULCOLAX) 5 MG EC tablet Take 1 tablet (5 mg total) by mouth daily as needed for moderate constipation. 09/30/16   Debbe Odea, MD  cyclobenzaprine (  FLEXERIL) 10 MG tablet Take 1 tablet (10 mg total) by mouth 2 (two) times daily as needed for muscle spasms. 06/01/17   Jacqlyn Larsen, PA-C  furosemide (LASIX) 80 MG tablet Take 1 tablet (80 mg total) by mouth daily. 09/06/16   Rosita Fire, MD  HYDROcodone-acetaminophen (NORCO/VICODIN) 5-325 MG tablet Take 1-2 tablets by mouth every 6 (six) hours as needed for severe pain. 08/19/17   Emeline General, PA-C  lisinopril-hydrochlorothiazide (PRINZIDE,ZESTORETIC) 20-25 MG tablet Take 1 tablet by mouth daily. 08/10/16   [provider]  metoprolol tartrate (LOPRESSOR) 25 MG tablet Take 0.5 tablets (12.5 mg total) by mouth 2 (two) times daily. 09/06/16    Rosita Fire, MD  polyethylene glycol [facility] / Floria Raveling) packet Take 17 g by mouth daily as needed for mild constipation. 09/30/16   Debbe Odea, MD  potassium chloride (K-DUR) 10 MEQ tablet Take 1 tablet (10 mEq total) by mouth daily for 10 days. 06/06/17 06/16/17  Caccavale, Sophia, PA-C  Vitamin D, Ergocalciferol, (DRISDOL) 50000 units CAPS capsule Take 50,000 Units by mouth every Monday. 08/09/16   [provider]    Family History Family History  Problem Relation Age of Onset  . Congestive Heart Failure Father     Social History Social History   Tobacco Use  . Smoking status: Never Smoker  . Smokeless tobacco: Never Used  Substance Use Topics  . Alcohol use: No  . Drug use: No     Allergies   Patient has no known allergies.   Review of Systems Review of Systems  Constitutional: Negative for chills, diaphoresis, fatigue and fever.  Respiratory: Negative for cough, choking, chest tightness, shortness of breath, wheezing and stridor.        Patient with chronic lower extremity edema.  Had DVT in the past currently on Eliquis.  Right leg with edema and recent DVT study last week negative.  Cardiovascular: Positive for leg swelling. Negative for chest pain and palpitations.  Gastrointestinal: Negative for abdominal pain, nausea and vomiting.  Genitourinary: Negative for dysuria, flank pain and hematuria.  Musculoskeletal: Positive for arthralgias and myalgias. Negative for back pain, gait problem, joint swelling, neck pain and neck stiffness.  Skin: Positive for color change. Negative for pallor, rash and wound.  Neurological: Negative for dizziness, weakness, light-headedness, numbness and headaches.  Psychiatric/Behavioral: Negative for behavioral problems.     Physical Exam Updated Vital Signs BP 135/90   Pulse 90   Temp 98.4 F (36.9 C) (Oral)   Resp 18   Ht 5\' 3"  (1.6 m)   Wt 124.7 kg (275 lb)   LMP 08/18/2017   SpO2 97%   BMI 48.71 kg/m     Physical Exam  Constitutional: She appears well-developed and well-nourished. No distress.  Afebrile, nontoxic-appearing, sitting comfortably in bed no acute distress.  HENT:  Head: Normocephalic and atraumatic.  Neck: Normal range of motion. Neck supple.  Cardiovascular: Normal rate, regular rhythm, normal heart sounds and intact distal pulses.  No murmur heard. Pulmonary/Chest: Effort normal and breath sounds normal. No stridor. No respiratory distress. She has no wheezes. She has no rales.  Abdominal: She exhibits no distension.  Musculoskeletal: Normal range of motion. She exhibits tenderness. She exhibits no edema or deformity.  Mild erythema to the first metatarsophalangeal joint of the left foot. Full rom. Tender to palpation medially at the first left metatarsophalangeal joint  Neurological: She is alert. No sensory deficit. She exhibits normal muscle tone.  Strong dorsalis pedis pulses.  5 out of 5 strength to flexion and extension of all toes, 5/5 strength to plantar flexion dorsiflexion. Neurovascularly intact.  Skin: Skin is warm and dry. No rash noted. She is not diaphoretic. No erythema. No pallor.  Mild erythema and warmth first metatarsophalangeal joint  Psychiatric: She has a normal mood and affect.  Nursing note and vitals reviewed.    ED Treatments / Results  Labs (all labs ordered are listed, but only abnormal results are displayed) Labs Reviewed - No data to display  EKG  EKG Interpretation None       Radiology Dg Foot Complete Left  Result Date: 08/19/2017 CLINICAL DATA:  Acute onset of left foot pain 6 or 7 days ago. No trauma. EXAM: LEFT FOOT - COMPLETE 3+ VIEW COMPARISON:  None. FINDINGS: There is no evidence of fracture or dislocation. There is no evidence of arthropathy or other focal bone abnormality. Soft tissues are unremarkable. IMPRESSION: Negative. Electronically Signed   By: Dorise Bullion III M.D   On: 08/19/2017 19:13     Procedures Procedures (including critical care time)  Medications Ordered in ED Medications - No data to display   Initial Impression / Assessment and Plan / ED Course  I have reviewed the triage vital signs and the nursing notes.  Pertinent labs & imaging results that were available during my care of the patient were reviewed by me and considered in my medical decision making (see chart for details).    Plain films negative for acute abnormality.  Low suspicion for septic arthritis. Patient has full rom, no joint swelling, no fever or systemic symptoms.  Patient's symptoms are suspicious for gout.  Patient discharged home with symptomatic relief and close follow-up with orthopedics and primary care provider.  Unable to use ibuprofen or naproxen due to interaction with Eliquis.  Return precautions discussed and understood.  Patient agrees with discharge plan.  Final Clinical Impressions(s) / ED Diagnoses   Final diagnoses:  Foot pain, left    ED Discharge Orders        Ordered    HYDROcodone-acetaminophen (NORCO/VICODIN) 5-325 MG tablet  Every 6 hours PRN     08/19/17 2104       Emeline General, PA-C 08/20/17 0310    Davonna Belling, MD 08/22/17 [phone]

## 2017-08-21 ENCOUNTER — Emergency Department (HOSPITAL_BASED_OUTPATIENT_CLINIC_OR_DEPARTMENT_OTHER): Admission: EM | Admit: 2017-08-21 | Payer: 59 | Source: Home / Self Care

## 2017-08-22 DIAGNOSIS — M109 Gout, unspecified: Secondary | ICD-10-CM | POA: Diagnosis not present

## 2017-08-22 DIAGNOSIS — I119 Hypertensive heart disease without heart failure: Secondary | ICD-10-CM | POA: Diagnosis not present

## 2017-10-06 DIAGNOSIS — I119 Hypertensive heart disease without heart failure: Secondary | ICD-10-CM | POA: Diagnosis not present

## 2017-10-06 DIAGNOSIS — N183 Chronic kidney disease, stage 3 (moderate): Secondary | ICD-10-CM | POA: Diagnosis not present

## 2017-10-06 DIAGNOSIS — Z0001 Encounter for general adult medical examination with abnormal findings: Secondary | ICD-10-CM | POA: Diagnosis not present

## 2017-10-06 DIAGNOSIS — Z86711 Personal history of pulmonary embolism: Secondary | ICD-10-CM | POA: Diagnosis not present

## 2017-10-26 DIAGNOSIS — M9902 Segmental and somatic dysfunction of thoracic region: Secondary | ICD-10-CM | POA: Diagnosis not present

## 2017-10-26 DIAGNOSIS — M5414 Radiculopathy, thoracic region: Secondary | ICD-10-CM | POA: Diagnosis not present

## 2017-10-26 DIAGNOSIS — M546 Pain in thoracic spine: Secondary | ICD-10-CM | POA: Diagnosis not present

## 2017-10-31 DIAGNOSIS — M546 Pain in thoracic spine: Secondary | ICD-10-CM | POA: Diagnosis not present

## 2017-10-31 DIAGNOSIS — M9902 Segmental and somatic dysfunction of thoracic region: Secondary | ICD-10-CM | POA: Diagnosis not present

## 2017-10-31 DIAGNOSIS — M5414 Radiculopathy, thoracic region: Secondary | ICD-10-CM | POA: Diagnosis not present

## 2017-11-02 DIAGNOSIS — M5414 Radiculopathy, thoracic region: Secondary | ICD-10-CM | POA: Diagnosis not present

## 2017-11-02 DIAGNOSIS — M546 Pain in thoracic spine: Secondary | ICD-10-CM | POA: Diagnosis not present

## 2017-11-02 DIAGNOSIS — M9902 Segmental and somatic dysfunction of thoracic region: Secondary | ICD-10-CM | POA: Diagnosis not present

## 2017-11-03 DIAGNOSIS — M546 Pain in thoracic spine: Secondary | ICD-10-CM | POA: Diagnosis not present

## 2017-11-03 DIAGNOSIS — M5414 Radiculopathy, thoracic region: Secondary | ICD-10-CM | POA: Diagnosis not present

## 2017-11-03 DIAGNOSIS — M9902 Segmental and somatic dysfunction of thoracic region: Secondary | ICD-10-CM | POA: Diagnosis not present

## 2017-11-08 DIAGNOSIS — M5414 Radiculopathy, thoracic region: Secondary | ICD-10-CM | POA: Diagnosis not present

## 2017-11-08 DIAGNOSIS — M9902 Segmental and somatic dysfunction of thoracic region: Secondary | ICD-10-CM | POA: Diagnosis not present

## 2017-11-08 DIAGNOSIS — M546 Pain in thoracic spine: Secondary | ICD-10-CM | POA: Diagnosis not present

## 2017-11-10 DIAGNOSIS — M546 Pain in thoracic spine: Secondary | ICD-10-CM | POA: Diagnosis not present

## 2017-11-10 DIAGNOSIS — M5414 Radiculopathy, thoracic region: Secondary | ICD-10-CM | POA: Diagnosis not present

## 2017-11-10 DIAGNOSIS — M9902 Segmental and somatic dysfunction of thoracic region: Secondary | ICD-10-CM | POA: Diagnosis not present

## 2017-11-14 DIAGNOSIS — M5414 Radiculopathy, thoracic region: Secondary | ICD-10-CM | POA: Diagnosis not present

## 2017-11-14 DIAGNOSIS — M9902 Segmental and somatic dysfunction of thoracic region: Secondary | ICD-10-CM | POA: Diagnosis not present

## 2017-11-14 DIAGNOSIS — M546 Pain in thoracic spine: Secondary | ICD-10-CM | POA: Diagnosis not present

## 2017-11-15 DIAGNOSIS — M546 Pain in thoracic spine: Secondary | ICD-10-CM | POA: Diagnosis not present

## 2017-11-15 DIAGNOSIS — M9902 Segmental and somatic dysfunction of thoracic region: Secondary | ICD-10-CM | POA: Diagnosis not present

## 2017-11-15 DIAGNOSIS — M5414 Radiculopathy, thoracic region: Secondary | ICD-10-CM | POA: Diagnosis not present

## 2017-11-17 ENCOUNTER — Other Ambulatory Visit: Payer: Self-pay

## 2017-11-17 ENCOUNTER — Encounter (HOSPITAL_BASED_OUTPATIENT_CLINIC_OR_DEPARTMENT_OTHER): Payer: Self-pay | Admitting: Emergency Medicine

## 2017-11-17 ENCOUNTER — Emergency Department (HOSPITAL_BASED_OUTPATIENT_CLINIC_OR_DEPARTMENT_OTHER)
Admission: EM | Admit: 2017-11-17 | Discharge: 2017-11-17 | Disposition: A | Discharge status: Home / Self Care | Payer: 59 | Attending: Emergency Medicine | Admitting: Emergency Medicine

## 2017-11-17 ENCOUNTER — Emergency Department (HOSPITAL_BASED_OUTPATIENT_CLINIC_OR_DEPARTMENT_OTHER): Payer: 59

## 2017-11-17 DIAGNOSIS — Z79899 Other long term (current) drug therapy: Secondary | ICD-10-CM | POA: Insufficient documentation

## 2017-11-17 DIAGNOSIS — K122 Cellulitis and abscess of mouth: Secondary | ICD-10-CM | POA: Diagnosis not present

## 2017-11-17 DIAGNOSIS — Z7901 Long term (current) use of anticoagulants: Secondary | ICD-10-CM | POA: Insufficient documentation

## 2017-11-17 DIAGNOSIS — J039 Acute tonsillitis, unspecified: Secondary | ICD-10-CM | POA: Insufficient documentation

## 2017-11-17 DIAGNOSIS — N182 Chronic kidney disease, stage 2 (mild): Secondary | ICD-10-CM | POA: Diagnosis not present

## 2017-11-17 DIAGNOSIS — J029 Acute pharyngitis, unspecified: Secondary | ICD-10-CM | POA: Diagnosis present

## 2017-11-17 DIAGNOSIS — J351 Hypertrophy of tonsils: Secondary | ICD-10-CM | POA: Diagnosis not present

## 2017-11-17 DIAGNOSIS — I129 Hypertensive chronic kidney disease with stage 1 through stage 4 chronic kidney disease, or unspecified chronic kidney disease: Secondary | ICD-10-CM | POA: Insufficient documentation

## 2017-11-17 HISTORY — DX: Morbid (severe) obesity due to excess calories: E66.01

## 2017-11-17 LAB — CBC WITH DIFFERENTIAL/PLATELET
BASOS PCT: 0 %
Basophils Absolute: 0 10*3/uL (ref 0.0–0.1)
EOS PCT: 0 %
Eosinophils Absolute: 0 10*3/uL (ref 0.0–0.7)
HEMATOCRIT: 35.7 % — AB (ref 36.0–46.0)
HEMOGLOBIN: 12.5 g/dL (ref 12.0–15.0)
Lymphocytes Relative: 6 %
Lymphs Abs: 0.7 10*3/uL (ref 0.7–4.0)
MCH: 30.7 pg (ref 26.0–34.0)
MCHC: 35 g/dL (ref 30.0–36.0)
MCV: 87.7 fL (ref 78.0–100.0)
MONOS PCT: 13 %
Monocytes Absolute: 1.5 10*3/uL — ABNORMAL HIGH (ref 0.1–1.0)
NEUTROS PCT: 81 %
Neutro Abs: 9.7 10*3/uL — ABNORMAL HIGH (ref 1.7–7.7)
Platelets: 176 10*3/uL (ref 150–400)
RBC: 4.07 MIL/uL (ref 3.87–5.11)
RDW: 14.9 % (ref 11.5–15.5)
WBC: 11.9 10*3/uL — AB (ref 4.0–10.5)

## 2017-11-17 LAB — BASIC METABOLIC PANEL
ANION GAP: 12 (ref 5–15)
BUN: 24 mg/dL — ABNORMAL HIGH (ref 6–20)
CHLORIDE: 104 mmol/L (ref 101–111)
CO2: 26 mmol/L (ref 22–32)
Calcium: 8.6 mg/dL — ABNORMAL LOW (ref 8.9–10.3)
Creatinine, Ser: 1.72 mg/dL — ABNORMAL HIGH (ref 0.44–1.00)
GFR calc Af Amer: 40 mL/min — ABNORMAL LOW (ref >60)
GFR, EST NON AFRICAN AMERICAN: 35 mL/min — AB (ref >60)
GLUCOSE: 131 mg/dL — AB (ref 65–99)
POTASSIUM: 3.3 mmol/L — AB (ref 3.5–5.1)
Sodium: 142 mmol/L (ref 135–145)

## 2017-11-17 LAB — RAPID STREP SCREEN (MED CTR MEBANE ONLY): Streptococcus, Group A Screen (Direct): NEGATIVE

## 2017-11-17 MED ORDER — CLINDAMYCIN HCL 150 MG PO CAPS: 600.0000 mg | ORAL_CAPSULE | Freq: Three times a day (TID) | ORAL | 0 refills | Status: AC

## 2017-11-17 MED ORDER — DEXAMETHASONE SODIUM PHOSPHATE 10 MG/ML IJ SOLN
10.0000 mg | Freq: Once | INTRAMUSCULAR | Status: AC
  Administered 2017-11-17: 10 mg via INTRAVENOUS
  Filled 2017-11-17: qty 1

## 2017-11-17 MED ORDER — KETOROLAC TROMETHAMINE 30 MG/ML IJ SOLN
30.0000 mg | Freq: Once | INTRAMUSCULAR | Status: AC
  Administered 2017-11-17: 30 mg via INTRAVENOUS
  Filled 2017-11-17: qty 1

## 2017-11-17 MED ORDER — SODIUM CHLORIDE 0.9 % IV BOLUS
1000.0000 mL | Freq: Once | INTRAVENOUS | Status: AC
  Administered 2017-11-17: 1000 mL via INTRAVENOUS

## 2017-11-17 MED ORDER — CLINDAMYCIN PHOSPHATE 600 MG/50ML IV SOLN
600.0000 mg | Freq: Once | INTRAVENOUS | Status: AC
  Administered 2017-11-17: 600 mg via INTRAVENOUS
  Filled 2017-11-17: qty 50

## 2017-11-17 MED ORDER — CLINDAMYCIN HCL 150 MG PO CAPS: 600.0000 mg | ORAL_CAPSULE | Freq: Three times a day (TID) | ORAL | 0 refills | Status: DC

## 2017-11-17 MED ORDER — IOPAMIDOL (ISOVUE-300) INJECTION 61%
60.0000 mL | Freq: Once | INTRAVENOUS | Status: AC | PRN
  Administered 2017-11-17: 60 mL via INTRAVENOUS

## 2017-11-17 MED FILL — CLINDAMYCIN HCL 300 MG CAP: 300 | 10 days supply | Qty: 60 | Fill #0

## 2017-11-17 NOTE — ED Notes (Signed)
Patient transported to CT 

## 2017-11-17 NOTE — ED Notes (Signed)
Ambulated on r/a  HR 100-110, RR 18-24, SpO2 >95%.  Denies increased DOE.

## 2017-11-17 NOTE — ED Notes (Signed)
Pt tolerating PO's well.  

## 2017-11-17 NOTE — ED Provider Notes (Signed)
TIME SEEN: 4:25 AM  CHIEF COMPLAINT: Sore throat  HPI: Patient is a 47 year old female with history of hypertension, CKD, DVT and PE on Eliquis who presents to the emergency department with 2 days of sore throat.  No known fever.  Is having a hard time opening her mouth, swallowing and speaking secondary to pain but no difficulty breathing.  Has had some nasal congestion.  Also complaining of right ear pain.  No known sick contacts.  ROS: See HPI Constitutional: no fever  Eyes: no drainage  ENT: no runny nose   Cardiovascular:  no chest pain  Resp: no SOB  GI: no vomiting GU: no dysuria Integumentary: no rash  Allergy: no hives  Musculoskeletal: no leg swelling  Neurological: no slurred speech ROS otherwise negative  PAST MEDICAL HISTORY/PAST SURGICAL HISTORY:  Past Medical History:  Diagnosis Date  . Chronic back pain   . CKD (chronic kidney disease) stage 2, GFR 60-89 ml/min   . DVT (deep venous thrombosis) ([place])   . Hypertension   . Lumbar stenosis   . Pulmonary embolism ([place])    a. diagnosed in 08/2016. Started on Eliquis    MEDICATIONS:  Prior to Admission medications   Medication Sig Start Date End Date Taking? Authorizing Provider  acetaminophen (TYLENOL) 500 MG tablet Take 500 mg by mouth every 6 (six) hours as needed (pain).    [provider]  apixaban (ELIQUIS) 5 MG TABS tablet Take 1 tablet (5 mg total) by mouth 2 (two) times daily. Please take 10 mg twice a day for 7 days and then 5 mg twice a day Patient taking differently: Take 5 mg by mouth 2 (two) times daily.  09/12/16   Rosita Fire, MD  bisacodyl (DULCOLAX) 5 MG EC tablet Take 1 tablet (5 mg total) by mouth daily as needed for moderate constipation. 09/30/16   Debbe Odea, MD  cyclobenzaprine (FLEXERIL) 10 MG tablet Take 1 tablet (10 mg total) by mouth 2 (two) times daily as needed for muscle spasms. 06/01/17   Jacqlyn Larsen, PA-C  furosemide (LASIX) 80 MG tablet Take 1 tablet (80 mg  total) by mouth daily. 09/06/16   Rosita Fire, MD  HYDROcodone-acetaminophen (NORCO/VICODIN) 5-325 MG tablet Take 1-2 tablets by mouth every 6 (six) hours as needed for severe pain. 08/19/17   Emeline General, PA-C  lisinopril-hydrochlorothiazide (PRINZIDE,ZESTORETIC) 20-25 MG tablet Take 1 tablet by mouth daily. 08/10/16   [provider]  metoprolol tartrate (LOPRESSOR) 25 MG tablet Take 0.5 tablets (12.5 mg total) by mouth 2 (two) times daily. 09/06/16   Rosita Fire, MD  polyethylene glycol [facility] / Floria Raveling) packet Take 17 g by mouth daily as needed for mild constipation. 09/30/16   Debbe Odea, MD  potassium chloride (K-DUR) 10 MEQ tablet Take 1 tablet (10 mEq total) by mouth daily for 10 days. 06/06/17 06/16/17  Caccavale, Sophia, PA-C  Vitamin D, Ergocalciferol, (DRISDOL) 50000 units CAPS capsule Take 50,000 Units by mouth every Monday. 08/09/16   [provider]    ALLERGIES:  No Known Allergies  SOCIAL HISTORY:  Social History   Tobacco Use  . Smoking status: Never Smoker  . Smokeless tobacco: Never Used  Substance Use Topics  . Alcohol use: No    FAMILY HISTORY: Family History  Problem Relation Age of Onset  . Congestive Heart Failure Father     EXAM: BP 125/84 (BP Location: Right Arm)   Pulse 100   Temp 99.1 F (37.3 C) (Oral)  Resp 18   Ht 5\' 3"  (1.6 m)   Wt 121.1 kg (267 lb)   SpO2 95%   BMI 47.30 kg/m  CONSTITUTIONAL: Alert and oriented and responds appropriately to questions.  Obese, appears uncomfortable, afebrile HEAD: Normocephalic EYES: Conjunctivae clear, pupils appear equal, EOMI ENT: normal nose; moist mucous membranes; TMs are clear bilaterally without erythema, purulence, bulging, perforation, effusion.  She does have a lot of cerumen in both auditory canals but it is not obstructing visualization of the TM completely. No sign of foreign body in the external auditory canal. No inflammation, erythema or drainage  from the external auditory canal. No signs of mastoiditis. No pain with manipulation of the pinna bilaterally.  Patient does have significant swelling of her uvula but no significant tonsillar hypertrophy or exudate seen on exam.  She does have some pharyngeal erythema noted without petechiae.  She has a hard time swallowing her own secretions and is spitting frequently.  Does have a small amount of trismus.  No sign of Ludwig angina.  No dental caries or dental abscess appreciated.  Slightly muffled voice.  No stridor. NECK: Supple, no meningismus, no nuchal rigidity, no LAD  CARD: RRR; S1 and S2 appreciated; no murmurs, no clicks, no rubs, no gallops RESP: Normal chest excursion without splinting or tachypnea; breath sounds clear and equal bilaterally; no wheezes, no rhonchi, no rales, no hypoxia or respiratory distress, speaking full sentences ABD/GI: Normal bowel sounds; non-distended; soft, non-tender, no rebound, no guarding, no peritoneal signs, no hepatosplenomegaly BACK:  The back appears normal and is non-tender to palpation, there is no CVA tenderness EXT: Normal ROM in all joints; non-tender to palpation; no edema; normal capillary refill; no cyanosis, no calf tenderness or swelling    SKIN: Normal color for age and race; warm; no rash NEURO: Moves all extremities equally PSYCH: The patient's mood and manner are appropriate. Grooming and personal hygiene are appropriate.  MEDICAL DECISION MAKING: Patient here with what appears to be uvulitis but exam is very limited given patient is unable to open her mouth fully and every time she does she gags and pulls away.  We have sent a strep swab and will obtain labs and a CT of her neck with contrast for further evaluation.  We will treat symptomatically with clindamycin, Decadron, Toradol and reassess.  ED PROGRESS: Labs unremarkable other than mild leukocytosis.  She does have an elevated creatinine which appears to be baseline.  Strep test is  negative.  CT scan shows asymmetric swelling of the right tonsil with edema within the right pharynx and mild inflammatory stranding but no abscess.  Findings consistent with acute tonsillitis.  She also has associated swelling and edema within the uvula in the right aspect of the epiglottis and right area epiglottic fold consistent with supraglottitis.  Discussed these findings with Dr. Redmond Baseman on call with ENT.  He thinks this is more likely due to dependent edema.  Given patient has improved significantly and states she is feeling much better and no longer has difficulty swallowing and her voice is almost back to normal, Dr. Redmond Baseman does not think it is unreasonable to let her go home.  We discussed this at length with patient and she is comfortable with plan for discharge after 2 more hours of monitoring in the emergency department.  We will make sure that she can drink, ambulate without difficulty.  She is very reliable, reasonable and feels comfortable with plan of watching her symptoms at home closely.  Dr.  Redmond Baseman does not feel that this is true epiglottitis and I agree.  Signed out to Dr. Leonette Monarch to reassess patient.   I reviewed all nursing notes, vitals, pertinent previous records, EKGs, lab and urine results, imaging (as available).      Patte Winkel, Delice Bison, DO 11/17/17 [phone]

## 2017-11-17 NOTE — ED Notes (Signed)
Report given to [place], [place]

## 2017-11-17 NOTE — ED Notes (Signed)
Patient states she has had a sore throat for the past 2-3 days.  Denies difficulty swallowing.  Also denies fever.  Upon evaluation uvula swollen,

## 2017-11-17 NOTE — ED Triage Notes (Signed)
Pt c/o sore throat x2 days 

## 2017-11-17 NOTE — ED Provider Notes (Signed)
I assumed care of this patient from Dr. Leonides Schanz at 0700.  Please see their note for further details of Hx, PE.  Briefly patient is a 47 y.o. female who presented with pharyngitis/tonsillitis with dependent edema.  Patient was treated with Decadron and antibiotics.  Case was discussed with Dr. Adonis Housekeeper from ENT who recommended monitoring for several hours.  If stable without respiratory distress and ability to tolerate oral intake, patient should be stable for discharge.  Patient was monitored for additional 2-1/2 hours without respiratory distress.  Patient was able to tolerate oral hydration and intake.   The patient is safe for discharge with strict return precautions.  Disposition: Discharge  Condition: Good  I have discussed the results, Dx and Tx plan with the patient who expressed understanding and agree(s) with the plan. Discharge instructions discussed at great length. The patient was given strict return precautions who verbalized understanding of the instructions. No further questions at time of discharge.    ED Discharge Orders        Ordered    clindamycin (CLEOCIN) 150 MG capsule  3 times daily     11/17/17 0945       Follow Up: Melida Quitter, [address] [place] [address] [phone]  Schedule an appointment as soon as possible for a visit  in 5-7 days, If symptoms do not improve or  worsen  [name], MD [address] [address] [phone]  Call  As needed        Fatima Blank, MD 11/17/17 [phone]

## 2017-11-18 IMAGING — MR MR LUMBAR SPINE W/O CM
4 of 5 series · 21 of 48 positions shown · non-contrast
Comparison: MRI of the lumbar spine December 26, 2015

CLINICAL DATA: Gradually worsening, acute on chronic constant back
pain for 1 month. Pain worsening for 4 days. Reported synovial cyst
of the spine. Numbness RIGHT leg. Recent falls.

EXAM:
MRI LUMBAR SPINE WITHOUT CONTRAST
TECHNIQUE: Multiplanar, multisequence MR imaging of the lumbar spine was
performed. No intravenous contrast was administered.

[Series 4: T2 · sagittal · 4.5mm · 0.59mm/px · 7 of 15 slices shown (1 of 2)]
[im 1/15]
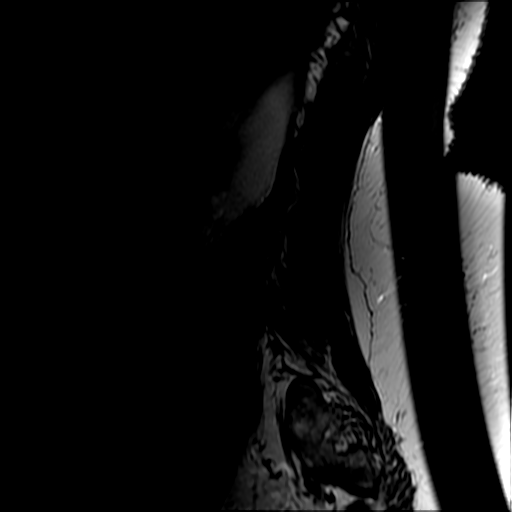
[im 3/15]
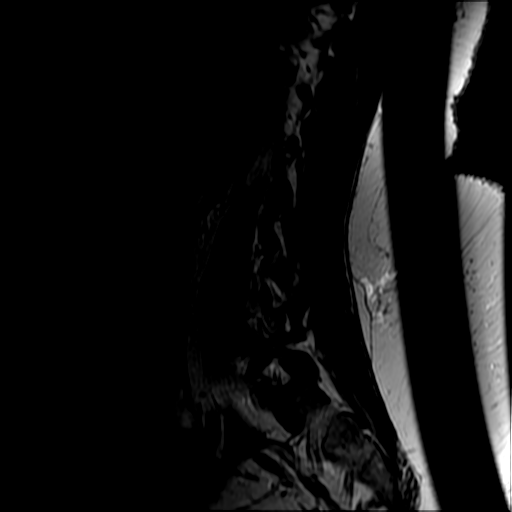
[im 5/15]
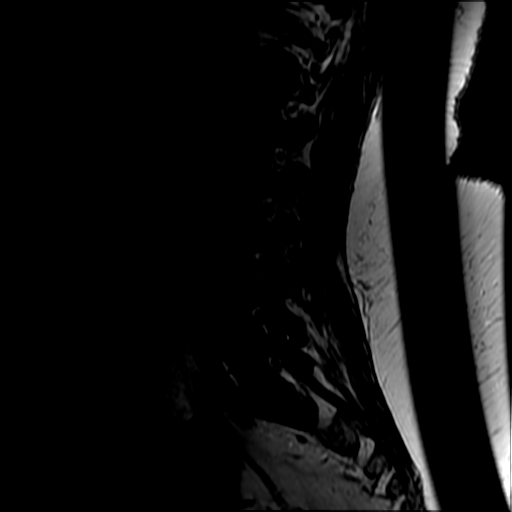
[im 8/15]
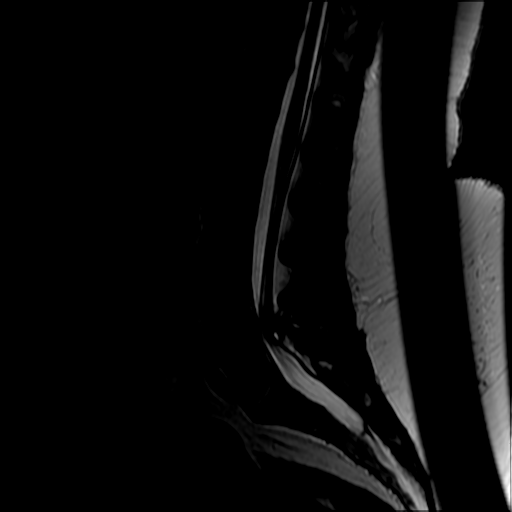
[im 10/15]
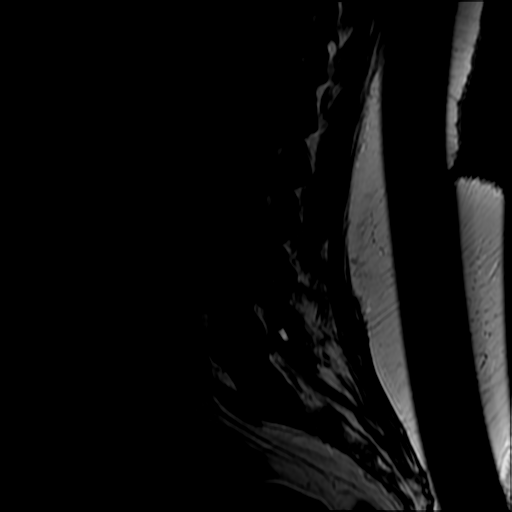
[im 12/15]
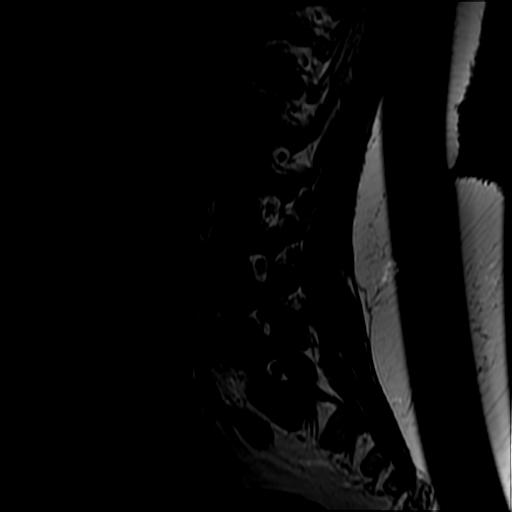
[im 15/15]
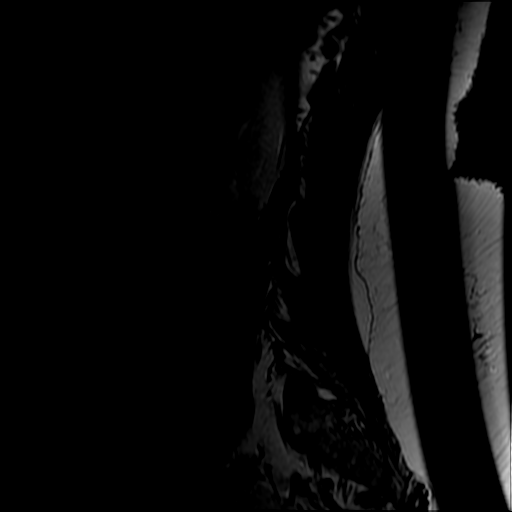

[Series 6: T1 · sagittal · 4.5mm · 0.59mm/px · 3 of 15 slices shown (1 of 2)]
[im 3/15]
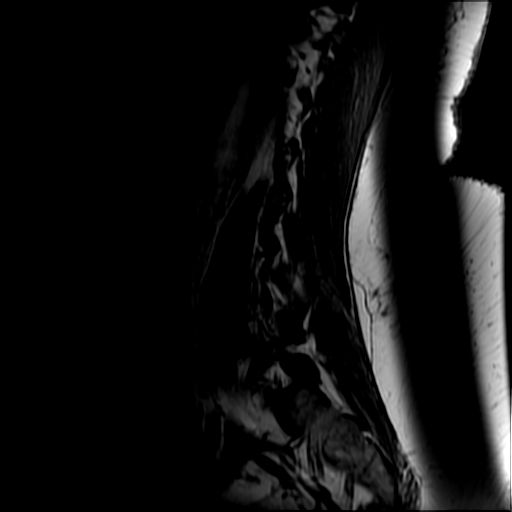
[im 9/15]
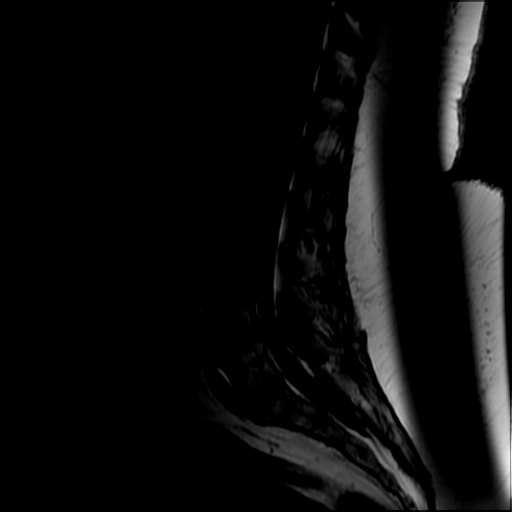
[im 15/15]
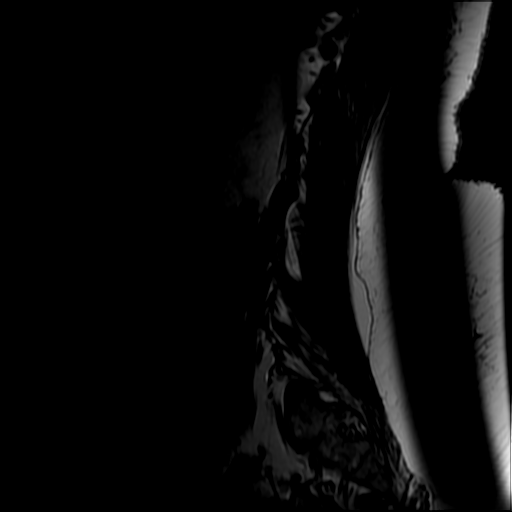

[Series 7: T2 · axial · 4.0mm · 0.43mm/px · z∈[-97,+96]mm · 8 of 33 slices shown (2 of 2)]
[im 1/33]
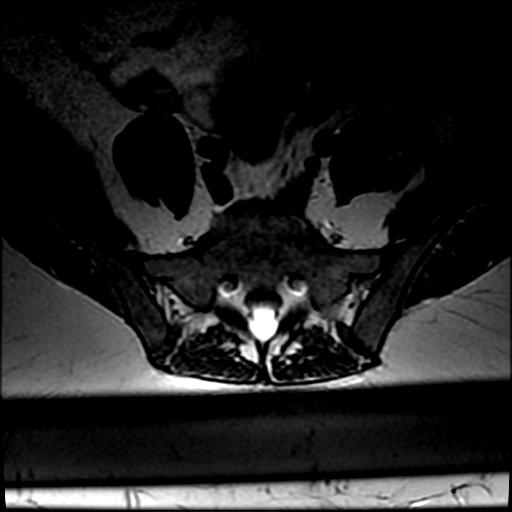
[im 5/33]
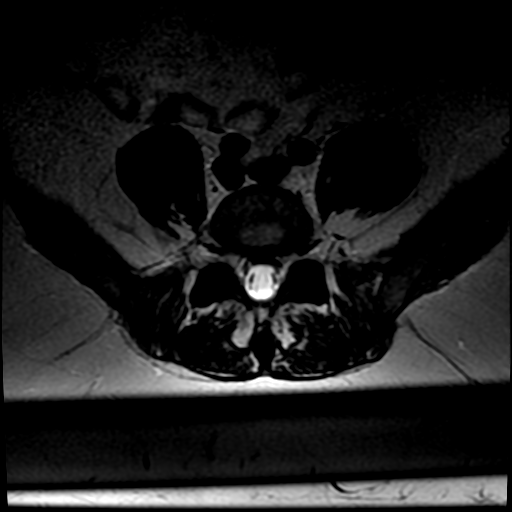
[im 10/33]
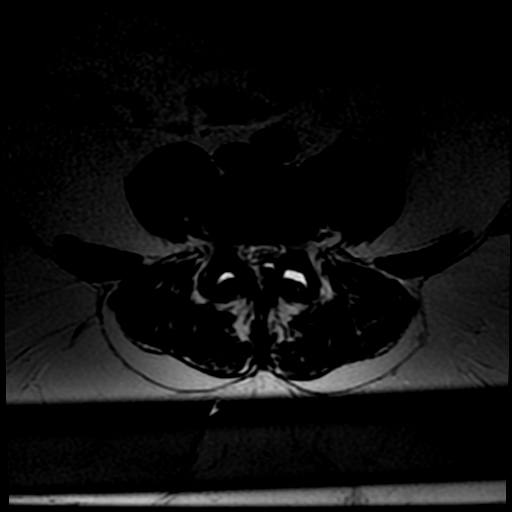
[im 15/33]
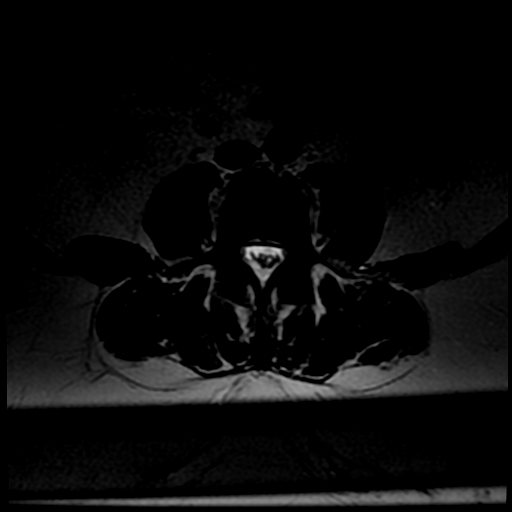
[im 18/33]
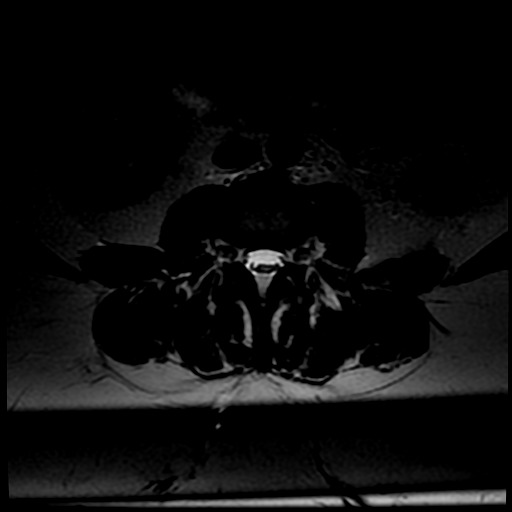
[im 23/33]
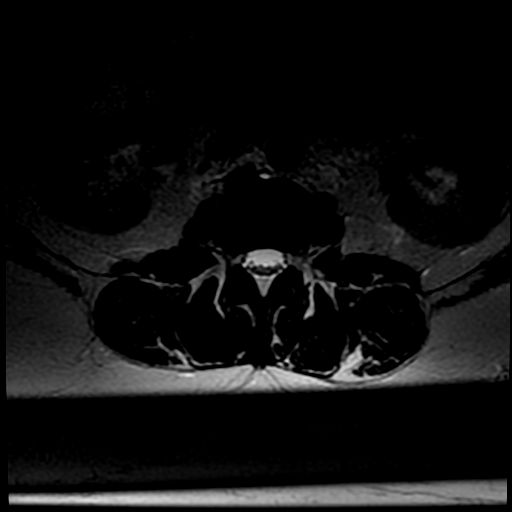
[im 28/33]
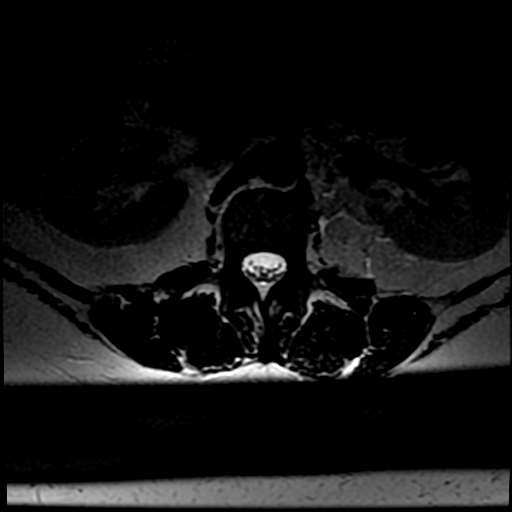
[im 33/33]
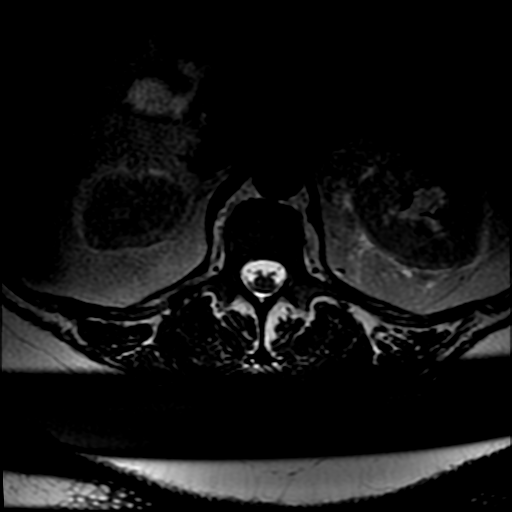

[Series 8: T1 · axial · 4.0mm · 0.86mm/px · z∈[-77,+71]mm · 3 of 33 slices shown (2 of 2)]
[im 5/33]
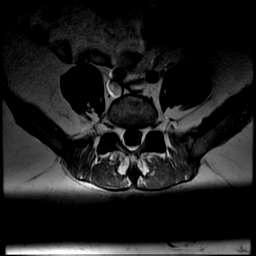
[im 18/33]
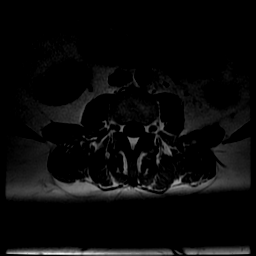
[im 28/33]
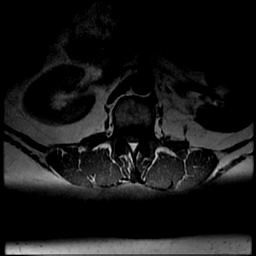

[21 of 48 positions shown; findings below may reference images not displayed]

FINDINGS: Large body habitus results [REDACTED]reased signal to noise ratio.

OSSEOUS STRUCTURES: Lumbar vertebral bodies are intact and aligned
with maintenance of lumbar lordosis. Intervertebral discs
demonstrate normal morphology and signal characteristics. No
abnormal bone marrow signal.

SPINAL CORD: Conus medullaris terminates at L1-2 and demonstrates
normal morphology and signal characteristics. Cauda equina is
normal.

SOFT TISSUES: Included prevertebral and paraspinal soft tissues are
normal.

LEVEL BY LEVEL EVALUATION:

L1-2 through L3-4:: No disc bulge, canal stenosis nor neural
foraminal narrowing.

L4-5: Severe facet arthropathy with increasing bilateral facet
effusions, measuring up to 3 mm on the LEFT. 7 x 7 x 7 mm LEFT facet
synovial cyst extending into the dorsal epidural space resulting in
mild thecal sac effacement and canal stenosis. No neural foraminal
narrowing.

L5-S1: No disc bulge, canal stenosis nor neural foraminal narrowing.
IMPRESSION: Severe L4-5 facet arthropathy with increasing facet effusions which
are likely reactive. Mild L4-5 facet widening can be seen with
dynamic instability. Similar 7 x 7 x 7 mm LEFT facet synovial cyst
within dorsal epidural space resulting in L4-5 mild canal stenosis.

No acute fracture or malalignment.

## 2017-11-19 LAB — CULTURE, GROUP A STREP (THRC)

## 2017-11-21 DIAGNOSIS — M546 Pain in thoracic spine: Secondary | ICD-10-CM | POA: Diagnosis not present

## 2017-11-21 DIAGNOSIS — M9902 Segmental and somatic dysfunction of thoracic region: Secondary | ICD-10-CM | POA: Diagnosis not present

## 2017-11-21 DIAGNOSIS — M5414 Radiculopathy, thoracic region: Secondary | ICD-10-CM | POA: Diagnosis not present

## 2017-11-22 DIAGNOSIS — M9902 Segmental and somatic dysfunction of thoracic region: Secondary | ICD-10-CM | POA: Diagnosis not present

## 2017-11-22 DIAGNOSIS — M5414 Radiculopathy, thoracic region: Secondary | ICD-10-CM | POA: Diagnosis not present

## 2017-11-22 DIAGNOSIS — M546 Pain in thoracic spine: Secondary | ICD-10-CM | POA: Diagnosis not present

## 2017-11-23 DIAGNOSIS — M9902 Segmental and somatic dysfunction of thoracic region: Secondary | ICD-10-CM | POA: Diagnosis not present

## 2017-11-23 DIAGNOSIS — M546 Pain in thoracic spine: Secondary | ICD-10-CM | POA: Diagnosis not present

## 2017-11-23 DIAGNOSIS — M5414 Radiculopathy, thoracic region: Secondary | ICD-10-CM | POA: Diagnosis not present

## 2017-11-28 DIAGNOSIS — M9902 Segmental and somatic dysfunction of thoracic region: Secondary | ICD-10-CM | POA: Diagnosis not present

## 2017-11-28 DIAGNOSIS — M546 Pain in thoracic spine: Secondary | ICD-10-CM | POA: Diagnosis not present

## 2017-11-28 DIAGNOSIS — M5414 Radiculopathy, thoracic region: Secondary | ICD-10-CM | POA: Diagnosis not present

## 2017-12-15 DIAGNOSIS — M546 Pain in thoracic spine: Secondary | ICD-10-CM | POA: Diagnosis not present

## 2017-12-15 DIAGNOSIS — M9902 Segmental and somatic dysfunction of thoracic region: Secondary | ICD-10-CM | POA: Diagnosis not present

## 2017-12-15 DIAGNOSIS — M5414 Radiculopathy, thoracic region: Secondary | ICD-10-CM | POA: Diagnosis not present

## 2018-01-09 DIAGNOSIS — I119 Hypertensive heart disease without heart failure: Secondary | ICD-10-CM | POA: Diagnosis not present

## 2018-01-09 DIAGNOSIS — E042 Nontoxic multinodular goiter: Secondary | ICD-10-CM | POA: Diagnosis not present

## 2018-01-09 DIAGNOSIS — N183 Chronic kidney disease, stage 3 (moderate): Secondary | ICD-10-CM | POA: Diagnosis not present

## 2018-03-18 DIAGNOSIS — Z0100 Encounter for examination of eyes and vision without abnormal findings: Secondary | ICD-10-CM | POA: Diagnosis not present

## 2018-04-13 DIAGNOSIS — E042 Nontoxic multinodular goiter: Secondary | ICD-10-CM | POA: Diagnosis not present

## 2018-04-13 DIAGNOSIS — I119 Hypertensive heart disease without heart failure: Secondary | ICD-10-CM | POA: Diagnosis not present

## 2018-04-13 DIAGNOSIS — N183 Chronic kidney disease, stage 3 (moderate): Secondary | ICD-10-CM | POA: Diagnosis not present

## 2018-06-07 DIAGNOSIS — M1712 Unilateral primary osteoarthritis, left knee: Secondary | ICD-10-CM | POA: Diagnosis not present

## 2018-06-07 DIAGNOSIS — M1711 Unilateral primary osteoarthritis, right knee: Secondary | ICD-10-CM | POA: Diagnosis not present

## 2018-06-07 DIAGNOSIS — M25561 Pain in right knee: Secondary | ICD-10-CM | POA: Diagnosis not present

## 2018-06-07 DIAGNOSIS — M25562 Pain in left knee: Secondary | ICD-10-CM | POA: Diagnosis not present

## 2018-06-26 ENCOUNTER — Encounter (HOSPITAL_BASED_OUTPATIENT_CLINIC_OR_DEPARTMENT_OTHER): Payer: Self-pay | Admitting: Emergency Medicine

## 2018-06-26 ENCOUNTER — Other Ambulatory Visit: Payer: Self-pay

## 2018-06-26 ENCOUNTER — Emergency Department (HOSPITAL_BASED_OUTPATIENT_CLINIC_OR_DEPARTMENT_OTHER)
Admission: EM | Admit: 2018-06-26 | Discharge: 2018-06-26 | Disposition: A | Discharge status: Home / Self Care | Payer: 59 | Attending: Emergency Medicine | Admitting: Emergency Medicine

## 2018-06-26 DIAGNOSIS — M5440 Lumbago with sciatica, unspecified side: Secondary | ICD-10-CM | POA: Diagnosis not present

## 2018-06-26 DIAGNOSIS — Z79899 Other long term (current) drug therapy: Secondary | ICD-10-CM | POA: Insufficient documentation

## 2018-06-26 DIAGNOSIS — Z7901 Long term (current) use of anticoagulants: Secondary | ICD-10-CM | POA: Diagnosis not present

## 2018-06-26 DIAGNOSIS — N183 Chronic kidney disease, stage 3 (moderate): Secondary | ICD-10-CM | POA: Insufficient documentation

## 2018-06-26 DIAGNOSIS — M5441 Lumbago with sciatica, right side: Secondary | ICD-10-CM | POA: Insufficient documentation

## 2018-06-26 DIAGNOSIS — I5032 Chronic diastolic (congestive) heart failure: Secondary | ICD-10-CM | POA: Insufficient documentation

## 2018-06-26 DIAGNOSIS — I130 Hypertensive heart and chronic kidney disease with heart failure and stage 1 through stage 4 chronic kidney disease, or unspecified chronic kidney disease: Secondary | ICD-10-CM | POA: Insufficient documentation

## 2018-06-26 DIAGNOSIS — M545 Low back pain: Secondary | ICD-10-CM | POA: Diagnosis present

## 2018-06-26 MED ORDER — IBUPROFEN 800 MG PO TABS
800.0000 mg | ORAL_TABLET | Freq: Once | ORAL | Status: AC
  Administered 2018-06-26: 800 mg via ORAL
  Filled 2018-06-26: qty 1

## 2018-06-26 MED ORDER — CYCLOBENZAPRINE HCL 10 MG PO TABS: 10.0000 mg | ORAL_TABLET | Freq: Three times a day (TID) | ORAL | 0 refills | Status: AC | PRN

## 2018-06-26 MED ORDER — IBUPROFEN 800 MG PO TABS: 800.0000 mg | ORAL_TABLET | Freq: Three times a day (TID) | ORAL | 0 refills | Status: AC

## 2018-06-26 NOTE — ED Provider Notes (Signed)
[facility] EMERGENCY DEPARTMENT Provider Note   CSN: [id number] Arrival date & time: 06/26/18  1928     History   Chief Complaint Chief Complaint  Patient presents with  . Back Pain    HPI Caitlin Taylor is a 47 y.o. female.  The history is provided by the patient.  Back Pain   This is a chronic problem. The current episode started more than 1 week ago. The problem occurs every several days. The problem has not changed since onset.The pain is associated with no known injury. The pain is present in the lumbar spine. The quality of the pain is described as shooting. The pain radiates to the right thigh. The pain is at a severity of 2/10. The pain is mild. The symptoms are aggravated by bending. The pain is the same all the time. Pertinent negatives include no chest pain, no fever, no numbness, no abdominal pain, no abdominal swelling, no bowel incontinence, no perianal numbness, no bladder incontinence, no dysuria, no leg pain, no paresthesias, no paresis, no tingling and no weakness. She has tried analgesics for the symptoms. The treatment provided mild relief. Risk factors include obesity.    Past Medical History:  Diagnosis Date  . Chronic back pain   . CKD (chronic kidney disease) stage 2, GFR 60-89 ml/min   . DVT (deep venous thrombosis) ([place])   . Hypertension   . Lumbar stenosis   . Morbid obesity ([facility])   . Pulmonary embolism ([place])    a. diagnosed in 08/2016. Started on Eliquis    Patient Active Problem List   Diagnosis Date Noted  . Abdominal wall abscess 09/30/2016  . Right leg DVT ([place]) 09/30/2016  . AKI (acute kidney injury) ([place]) 09/22/2016  . Chronic diastolic CHF (congestive heart failure) ([place]) 09/22/2016  . Acute deep vein thrombosis (DVT) of proximal vein of right lower extremity ([place])   . Edema 09/04/2016  . History of pulmonary embolus (PE) 09/03/2016  . Benign essential HTN   . Hypokalemia   . Itching   . Swelling   . CKD (chronic  kidney disease), stage III ([place])   . Acute lower UTI   . Leukocytosis   . Lumbar myelopathy ([place]) 01/12/2016  . Low back pain   . Lumbar stenosis   . S/P lumbar fusion   . Post-operative pain   . Normocytic anemia   . Constipation due to pain medication   . Morbid obesity due to excess calories ([place])   . Back pain 01/05/2016  . Renal insufficiency 01/05/2016  . Hyperglycemia 01/05/2016  . Lumbar radiculopathy 01/05/2016  . UTI (urinary tract infection) 01/05/2016  . Essential hypertension 01/05/2016    Past Surgical History:  Procedure Laterality Date  . CESAREAN SECTION    . IRRIGATION AND DEBRIDEMENT ABSCESS N/A 09/27/2016   Procedure: IRRIGATION AND DEBRIDEMENT OF ABDOMINAL WALL ABSCESS;  Surgeon: Greer Pickerel, MD;  Location: [facility] ORS;  Service: General;  Laterality: N/A;  . MAXIMUM ACCESS (MAS)POSTERIOR LUMBAR INTERBODY FUSION (PLIF) 1 LEVEL N/A 01/06/2016   Procedure: FOR MAXIMUM ACCESS (MAS) POSTERIOR LUMBAR INTERBODY FUSION (PLIF) LUMBAR FOUR-FIVE;  Surgeon: Kevan Ny Ditty, MD;  Location: [facility]UR[facility] ORS;  Service: Neurosurgery;  Laterality: N/A;     OB History   No obstetric history on file.      Home Medications    Prior to Admission medications   Medication Sig Start Date End Date Taking? Authorizing Provider  acetaminophen (TYLENOL) 500 MG tablet Take 500 mg by mouth  every 6 (six) hours as needed (pain).    [provider]  apixaban (ELIQUIS) 5 MG TABS tablet Take 1 tablet (5 mg total) by mouth 2 (two) times daily. Please take 10 mg twice a day for 7 days and then 5 mg twice a day Patient taking differently: Take 5 mg by mouth 2 (two) times daily.  09/12/16   Rosita Fire, MD  bisacodyl (DULCOLAX) 5 MG EC tablet Take 1 tablet (5 mg total) by mouth daily as needed for moderate constipation. 09/30/16   Debbe Odea, MD  cyclobenzaprine (FLEXERIL) 10 MG tablet Take 1 tablet (10 mg total) by mouth 3 (three) times daily as needed for up to 15 days for  muscle spasms. 06/26/18 07/11/18  Xavier Fournier, DO  furosemide (LASIX) 80 MG tablet Take 1 tablet (80 mg total) by mouth daily. 09/06/16   Rosita Fire, MD  HYDROcodone-acetaminophen (NORCO/VICODIN) 5-325 MG tablet Take 1-2 tablets by mouth every 6 (six) hours as needed for severe pain. 08/19/17   Emeline General, PA-C  ibuprofen (ADVIL,MOTRIN) 800 MG tablet Take 1 tablet (800 mg total) by mouth 3 (three) times daily for 30 doses. 06/26/18 07/06/18  Nana Hoselton, DO  lisinopril-hydrochlorothiazide (PRINZIDE,ZESTORETIC) 20-25 MG tablet Take 1 tablet by mouth daily. 08/10/16   [provider]  metoprolol tartrate (LOPRESSOR) 25 MG tablet Take 0.5 tablets (12.5 mg total) by mouth 2 (two) times daily. 09/06/16   Rosita Fire, MD  polyethylene glycol [facility] / Floria Raveling) packet Take 17 g by mouth daily as needed for mild constipation. 09/30/16   Debbe Odea, MD  potassium chloride (K-DUR) 10 MEQ tablet Take 1 tablet (10 mEq total) by mouth daily for 10 days. 06/06/17 06/16/17  Caccavale, Sophia, PA-C  Vitamin D, Ergocalciferol, (DRISDOL) 50000 units CAPS capsule Take 50,000 Units by mouth every Monday. 08/09/16   [provider]    Family History Family History  Problem Relation Age of Onset  . Congestive Heart Failure Father     Social History Social History   Tobacco Use  . Smoking status: Never Smoker  . Smokeless tobacco: Never Used  Substance Use Topics  . Alcohol use: No  . Drug use: No     Allergies   Patient has no known allergies.   Review of Systems Review of Systems  Constitutional: Negative for chills and fever.  HENT: Negative for ear pain and sore throat.   Eyes: Negative for pain and visual disturbance.  Respiratory: Negative for cough and shortness of breath.   Cardiovascular: Negative for chest pain and palpitations.  Gastrointestinal: Negative for abdominal pain, bowel incontinence and vomiting.  Genitourinary: Negative for  bladder incontinence, dysuria and hematuria.  Musculoskeletal: Positive for back pain. Negative for arthralgias.  Skin: Negative for color change and rash.  Neurological: Negative for tingling, seizures, syncope, weakness, numbness and paresthesias.  All other systems reviewed and are negative.    Physical Exam Updated Vital Signs BP 104/67 (BP Location: Right Arm)   Pulse 100   Temp 98.3 F (36.8 C) (Oral)   Resp 18   Ht 5\' 3"  (1.6 m)   Wt 125.2 kg   SpO2 94%   BMI 48.89 kg/m   Physical Exam Vitals signs and nursing note reviewed.  Constitutional:      General: She is not in acute distress.    Appearance: She is well-developed.  HENT:     Head: Normocephalic and atraumatic.     Nose: Nose normal.  Mouth/Throat:     Mouth: Mucous membranes are moist.  Eyes:     Extraocular Movements: Extraocular movements intact.     Conjunctiva/sclera: Conjunctivae normal.     Pupils: Pupils are equal, round, and reactive to light.  Neck:     Musculoskeletal: Normal range of motion and neck supple.  Cardiovascular:     Rate and Rhythm: Normal rate and regular rhythm.     Pulses: Normal pulses.     Heart sounds: Normal heart sounds. No murmur.  Pulmonary:     Effort: Pulmonary effort is normal. No respiratory distress.     Breath sounds: Normal breath sounds.  Abdominal:     Palpations: Abdomen is soft.     Tenderness: There is no abdominal tenderness.  Musculoskeletal: Normal range of motion.     Comments: No midline spinal pain, TTP in right piriformis muscles, TTP in paraspinal lumbar muscles  Skin:    General: Skin is warm and dry.     Capillary Refill: Capillary refill takes less than 2 seconds.  Neurological:     General: No focal deficit present.     Mental Status: She is alert.     Cranial Nerves: No cranial nerve deficit.     Sensory: No sensory deficit.     Motor: No weakness.     Coordination: Coordination normal.     Gait: Gait normal.     Comments: 5+/5  strength, normal sensation      ED Treatments / Results  Labs (all labs ordered are listed, but only abnormal results are displayed) Labs Reviewed - No data to display  EKG None  Radiology No results found.  Procedures Procedures (including critical care time)  Medications Ordered in ED Medications  ibuprofen (ADVIL,MOTRIN) tablet 800 mg (800 mg Oral Given 06/26/18 2244)     Initial Impression / Assessment and Plan / ED Course  I have reviewed the triage vital signs and the nursing notes.  Pertinent labs & imaging results that were available during my care of the patient were reviewed by me and considered in my medical decision making (see chart for details).     Caitlin Taylor is a 47 year old female with history of obesity, chronic back pain who presents to the ED with back pain.  Patient normal vitals.  No fever.  Patient states that she exacerbated her low back pain.  Has used Tylenol with some relief.  Has not used any anti-inflammatories.  Patient with no loss of bowel or bladder.  No midline spinal pain.  Has pain in the piriformis area.  Has some paraspinal tenderness in the lumbar region.  Patient likely with some sciatic pain versus muscle strain.  Given Motrin while in the ED.  Will give Motrin and Flexeril prescription for home.  Recommend continued use of Tylenol as well.  No concern for cord compression including cauda equina, epidural abscess. Normal neuro exam.  Likely acute on chronic pain.  Given home exercises and discharged from ED in good condition.  Recommend follow-up with primary care doctor.  Final Clinical Impressions(s) / ED Diagnoses   Final diagnoses:  Acute right-sided low back pain with sciatica, sciatica laterality unspecified    ED Discharge Orders         Ordered    ibuprofen (ADVIL,MOTRIN) 800 MG tablet  3 times daily     06/26/18 2221    cyclobenzaprine (FLEXERIL) 10 MG tablet  3 times daily PRN     06/26/18 2221  Lennice Sites, DO 06/27/18 [phone]

## 2018-06-26 NOTE — ED Triage Notes (Signed)
Pt c/o 10/10 lower back pain and right foot pain, pt denies any injury.

## 2018-08-27 ENCOUNTER — Encounter (HOSPITAL_BASED_OUTPATIENT_CLINIC_OR_DEPARTMENT_OTHER): Payer: Self-pay | Admitting: *Deleted

## 2018-08-27 ENCOUNTER — Other Ambulatory Visit: Payer: Self-pay

## 2018-08-27 ENCOUNTER — Emergency Department (HOSPITAL_BASED_OUTPATIENT_CLINIC_OR_DEPARTMENT_OTHER): Payer: 59

## 2018-08-27 ENCOUNTER — Inpatient Hospital Stay (HOSPITAL_BASED_OUTPATIENT_CLINIC_OR_DEPARTMENT_OTHER)
Admission: EM | Admit: 2018-08-27 | Discharge: 2018-09-11 | DRG: 580 | Disposition: A | Discharge status: Home Health | Payer: 59 | Attending: Internal Medicine | Admitting: Internal Medicine

## 2018-08-27 DIAGNOSIS — B9620 Unspecified Escherichia coli [E. coli] as the cause of diseases classified elsewhere: Secondary | ICD-10-CM | POA: Diagnosis not present

## 2018-08-27 DIAGNOSIS — R1110 Vomiting, unspecified: Secondary | ICD-10-CM | POA: Diagnosis present

## 2018-08-27 DIAGNOSIS — N183 Chronic kidney disease, stage 3 unspecified: Secondary | ICD-10-CM | POA: Diagnosis present

## 2018-08-27 DIAGNOSIS — D72829 Elevated white blood cell count, unspecified: Secondary | ICD-10-CM

## 2018-08-27 DIAGNOSIS — Z6841 Body Mass Index (BMI) 40.0 and over, adult: Secondary | ICD-10-CM | POA: Diagnosis not present

## 2018-08-27 DIAGNOSIS — L03314 Cellulitis of groin: Secondary | ICD-10-CM | POA: Diagnosis not present

## 2018-08-27 DIAGNOSIS — L0390 Cellulitis, unspecified: Secondary | ICD-10-CM | POA: Insufficient documentation

## 2018-08-27 DIAGNOSIS — M109 Gout, unspecified: Secondary | ICD-10-CM | POA: Diagnosis present

## 2018-08-27 DIAGNOSIS — E875 Hyperkalemia: Secondary | ICD-10-CM | POA: Diagnosis not present

## 2018-08-27 DIAGNOSIS — T368X5A Adverse effect of other systemic antibiotics, initial encounter: Secondary | ICD-10-CM | POA: Diagnosis present

## 2018-08-27 DIAGNOSIS — Z86711 Personal history of pulmonary embolism: Secondary | ICD-10-CM | POA: Diagnosis not present

## 2018-08-27 DIAGNOSIS — I130 Hypertensive heart and chronic kidney disease with heart failure and stage 1 through stage 4 chronic kidney disease, or unspecified chronic kidney disease: Secondary | ICD-10-CM | POA: Diagnosis not present

## 2018-08-27 DIAGNOSIS — L03116 Cellulitis of left lower limb: Secondary | ICD-10-CM | POA: Diagnosis present

## 2018-08-27 DIAGNOSIS — M549 Dorsalgia, unspecified: Secondary | ICD-10-CM | POA: Diagnosis not present

## 2018-08-27 DIAGNOSIS — L03311 Cellulitis of abdominal wall: Secondary | ICD-10-CM | POA: Diagnosis present

## 2018-08-27 DIAGNOSIS — I96 Gangrene, not elsewhere classified: Secondary | ICD-10-CM | POA: Diagnosis present

## 2018-08-27 DIAGNOSIS — S70322A Blister (nonthermal), left thigh, initial encounter: Secondary | ICD-10-CM | POA: Diagnosis not present

## 2018-08-27 DIAGNOSIS — Z23 Encounter for immunization: Secondary | ICD-10-CM

## 2018-08-27 DIAGNOSIS — Z86718 Personal history of other venous thrombosis and embolism: Secondary | ICD-10-CM

## 2018-08-27 DIAGNOSIS — I5032 Chronic diastolic (congestive) heart failure: Secondary | ICD-10-CM | POA: Diagnosis present

## 2018-08-27 DIAGNOSIS — L02215 Cutaneous abscess of perineum: Secondary | ICD-10-CM | POA: Diagnosis present

## 2018-08-27 DIAGNOSIS — Z981 Arthrodesis status: Secondary | ICD-10-CM

## 2018-08-27 DIAGNOSIS — L0291 Cutaneous abscess, unspecified: Secondary | ICD-10-CM | POA: Diagnosis present

## 2018-08-27 DIAGNOSIS — G8929 Other chronic pain: Secondary | ICD-10-CM | POA: Diagnosis not present

## 2018-08-27 DIAGNOSIS — L02416 Cutaneous abscess of left lower limb: Secondary | ICD-10-CM | POA: Diagnosis present

## 2018-08-27 DIAGNOSIS — B954 Other streptococcus as the cause of diseases classified elsewhere: Secondary | ICD-10-CM | POA: Diagnosis present

## 2018-08-27 DIAGNOSIS — Z8249 Family history of ischemic heart disease and other diseases of the circulatory system: Secondary | ICD-10-CM

## 2018-08-27 DIAGNOSIS — E279 Disorder of adrenal gland, unspecified: Secondary | ICD-10-CM | POA: Diagnosis not present

## 2018-08-27 DIAGNOSIS — L02214 Cutaneous abscess of groin: Secondary | ICD-10-CM | POA: Diagnosis not present

## 2018-08-27 DIAGNOSIS — Z79899 Other long term (current) drug therapy: Secondary | ICD-10-CM

## 2018-08-27 DIAGNOSIS — N179 Acute kidney failure, unspecified: Secondary | ICD-10-CM | POA: Diagnosis present

## 2018-08-27 DIAGNOSIS — I10 Essential (primary) hypertension: Secondary | ICD-10-CM | POA: Diagnosis present

## 2018-08-27 LAB — BASIC METABOLIC PANEL
Anion gap: 10 (ref 5–15)
BUN: 35 mg/dL — ABNORMAL HIGH (ref 6–20)
CALCIUM: 8.7 mg/dL — AB (ref 8.9–10.3)
CHLORIDE: 101 mmol/L (ref 98–111)
CO2: 24 mmol/L (ref 22–32)
CREATININE: 1.97 mg/dL — AB (ref 0.44–1.00)
GFR calc non Af Amer: 30 mL/min — ABNORMAL LOW (ref >60)
GFR, EST AFRICAN AMERICAN: 34 mL/min — AB (ref >60)
Glucose, Bld: 118 mg/dL — ABNORMAL HIGH (ref 70–99)
Potassium: 3.8 mmol/L (ref 3.5–5.1)
Sodium: 135 mmol/L (ref 135–145)

## 2018-08-27 LAB — CBC
HCT: 37.4 % (ref 36.0–46.0)
Hemoglobin: 12 g/dL (ref 12.0–15.0)
MCH: 30.9 pg (ref 26.0–34.0)
MCHC: 32.1 g/dL (ref 30.0–36.0)
MCV: 96.4 fL (ref 80.0–100.0)
Platelets: 142 10*3/uL — ABNORMAL LOW (ref 150–400)
RBC: 3.88 MIL/uL (ref 3.87–5.11)
RDW: 14.2 % (ref 11.5–15.5)
WBC: 15.9 10*3/uL — ABNORMAL HIGH (ref 4.0–10.5)
nRBC: 0 % (ref 0.0–0.2)

## 2018-08-27 LAB — CBC WITH DIFFERENTIAL/PLATELET
ABS IMMATURE GRANULOCYTES: 1.18 10*3/uL — AB (ref 0.00–0.07)
BASOS PCT: 0 %
Basophils Absolute: 0.1 10*3/uL (ref 0.0–0.1)
EOS ABS: 0 10*3/uL (ref 0.0–0.5)
Eosinophils Relative: 0 %
HEMATOCRIT: 37.8 % (ref 36.0–46.0)
HEMOGLOBIN: 12.1 g/dL (ref 12.0–15.0)
IMMATURE GRANULOCYTES: 7 %
LYMPHS ABS: 0.5 10*3/uL — AB (ref 0.7–4.0)
Lymphocytes Relative: 3 %
MCH: 30.2 pg (ref 26.0–34.0)
MCHC: 32 g/dL (ref 30.0–36.0)
MCV: 94.3 fL (ref 80.0–100.0)
MONO ABS: 1.9 10*3/uL — AB (ref 0.1–1.0)
MONOS PCT: 11 %
Neutro Abs: 12.6 10*3/uL — ABNORMAL HIGH (ref 1.7–7.7)
Neutrophils Relative %: 79 %
PLATELETS: 150 10*3/uL (ref 150–400)
RBC: 4.01 MIL/uL (ref 3.87–5.11)
RDW: 13.9 % (ref 11.5–15.5)
Smear Review: NORMAL
WBC Morphology: INCREASED
WBC: 16.3 10*3/uL — ABNORMAL HIGH (ref 4.0–10.5)
nRBC: 0 % (ref 0.0–0.2)

## 2018-08-27 LAB — CREATININE, SERUM
Creatinine, Ser: 1.91 mg/dL — ABNORMAL HIGH (ref 0.44–1.00)
GFR calc Af Amer: 36 mL/min — ABNORMAL LOW (ref >60)
GFR calc non Af Amer: 31 mL/min — ABNORMAL LOW (ref >60)

## 2018-08-27 LAB — HCG, SERUM, QUALITATIVE: Preg, Serum: NEGATIVE

## 2018-08-27 LAB — ALBUMIN: Albumin: 3.5 g/dL (ref 3.5–5.0)

## 2018-08-27 MED ORDER — ALLOPURINOL 100 MG PO TABS
100.0000 mg | ORAL_TABLET | Freq: Every day | ORAL | Status: DC
  Administered 2018-08-27 – 2018-09-11 (×15): 100 mg via ORAL
  Filled 2018-08-27 (×15): qty 1

## 2018-08-27 MED ORDER — POLYETHYLENE GLYCOL 3350 17 G PO PACK
17.0000 g | PACK | Freq: Every day | ORAL | Status: DC
  Administered 2018-08-27 – 2018-09-05 (×9): 17 g via ORAL
  Filled 2018-08-27 (×12): qty 1

## 2018-08-27 MED ORDER — ENOXAPARIN SODIUM 40 MG/0.4ML ~~LOC~~ SOLN
40.0000 mg | SUBCUTANEOUS | Status: DC
  Administered 2018-08-27 – 2018-09-10 (×15): 40 mg via SUBCUTANEOUS
  Filled 2018-08-27 (×15): qty 0.4

## 2018-08-27 MED ORDER — SODIUM CHLORIDE 0.9 % IV SOLN
2.0000 g | INTRAVENOUS | Status: DC
  Administered 2018-08-27 – 2018-08-29 (×3): 2 g via INTRAVENOUS
  Filled 2018-08-27 (×2): qty 2
  Filled 2018-08-27 (×2): qty 20

## 2018-08-27 MED ORDER — VANCOMYCIN HCL IN DEXTROSE 750-5 MG/150ML-% IV SOLN
750.0000 mg | INTRAVENOUS | Status: DC
  Filled 2018-08-27: qty 150

## 2018-08-27 MED ORDER — IOPAMIDOL (ISOVUE-300) INJECTION 61%
100.0000 mL | Freq: Once | INTRAVENOUS | Status: AC | PRN
  Administered 2018-08-27: 80 mL via INTRAVENOUS

## 2018-08-27 MED ORDER — METOPROLOL TARTRATE 25 MG PO TABS
12.5000 mg | ORAL_TABLET | Freq: Two times a day (BID) | ORAL | Status: DC
  Administered 2018-08-27 – 2018-09-11 (×30): 12.5 mg via ORAL
  Filled 2018-08-27 (×8): qty 1
  Filled 2018-08-27: qty 0.5
  Filled 2018-08-27 (×23): qty 1

## 2018-08-27 MED ORDER — SODIUM CHLORIDE 0.9 % IV BOLUS
1000.0000 mL | Freq: Once | INTRAVENOUS | Status: AC
  Administered 2018-08-27: 1000 mL via INTRAVENOUS

## 2018-08-27 MED ORDER — VANCOMYCIN HCL 10 G IV SOLR
2000.0000 mg | Freq: Once | INTRAVENOUS | Status: AC
  Administered 2018-08-27: 2000 mg via INTRAVENOUS
  Filled 2018-08-27: qty 2000

## 2018-08-27 MED ORDER — SODIUM CHLORIDE 0.9 % IV SOLN
1.0000 g | Freq: Once | INTRAVENOUS | Status: AC
  Administered 2018-08-27: 1 g via INTRAVENOUS
  Filled 2018-08-27: qty 10

## 2018-08-27 MED ORDER — LACTATED RINGERS IV SOLN
INTRAVENOUS | Status: DC
  Administered 2018-08-27 – 2018-08-28 (×2): via INTRAVENOUS

## 2018-08-27 MED ORDER — NORETHINDRONE 0.35 MG PO TABS: 1.0000 | ORAL_TABLET | Freq: Every day | ORAL | Status: DC

## 2018-08-27 MED ORDER — OXYCODONE HCL 5 MG PO TABS
5.0000 mg | ORAL_TABLET | ORAL | Status: DC | PRN
  Administered 2018-08-27 – 2018-09-04 (×25): 5 mg via ORAL
  Filled 2018-08-27 (×26): qty 1

## 2018-08-27 MED ORDER — ACETAMINOPHEN 325 MG PO TABS
650.0000 mg | ORAL_TABLET | Freq: Four times a day (QID) | ORAL | Status: DC | PRN
  Administered 2018-08-27 – 2018-08-31 (×6): 650 mg via ORAL
  Filled 2018-08-27 (×6): qty 2

## 2018-08-27 MED ORDER — ACETAMINOPHEN 650 MG RE SUPP: 650.0000 mg | Freq: Four times a day (QID) | RECTAL | Status: DC | PRN

## 2018-08-27 MED ORDER — COLCHICINE 0.6 MG PO TABS
0.6000 mg | ORAL_TABLET | Freq: Two times a day (BID) | ORAL | Status: DC
  Administered 2018-08-27 – 2018-09-07 (×24): 0.6 mg via ORAL
  Filled 2018-08-27 (×25): qty 1

## 2018-08-27 MED ORDER — VANCOMYCIN HCL IN DEXTROSE 1-5 GM/200ML-% IV SOLN: 1000.0000 mg | Freq: Once | INTRAVENOUS | Status: DC

## 2018-08-27 NOTE — H&P (Addendum)
**Note De-Identified vi Obfusction** History nd Physicl    DO KENDZIERSKI FTD:[id number] DOB: [birth year] DO: 08/27/2018  PCP: Jild Pnd, MD  Ptient coming from: home  I hve personlly briefly reviewed ptient's old medicl records in [place]  Chief Complint: L groin pin  HPI: Caitlin Taylor is  48 y.o. femle with medicl history significnt of DVT nd PE, hypertension, chronic kidney disese, obesity presenting with left groin pin nd concern for cellulitis nd  developing phlegmon on imging.  She notes tht her symptoms strted on Mondy or Tuesdy.  t tht point she thought mybe her period ws strting.  Her symptoms progressively got worse.  For tht reson she cme into the hospitl.  She describes L groin pins s shrp nd stbbing.  She describes left-sided bdominl pin s "just pin".  She denies ny fevers, chest pin, cough, chills, nuse, vomiting, dirrhe, constiption.  She notes some shortness of breth with exertion which is chronic.  She notes chronic lower extremity edem.  She notes tht her left leg feels wek becuse of the pin.  She hs similr presenttion in Mrch 2018 which required I&D by surgery.    ED Course: Lbs, imging, bx.  dmit to hospitlist.  Review of Systems: s per HPI otherwise 10 point review of systems negtive.   Pst Medicl History:  Dignosis Dte  . Chronic bck pin   . CKD (chronic kidney disese) stge 2, GFR 60-89 ml/min   . DVT (deep venous thrombosis) ([place])   . Hypertension   . Lumbr stenosis   . Morbid obesity ([place])   . Pulmonry embolism ([place])    . dignosed in 08/2016. Strted on Eliquis    Pst Surgicl History:  Procedure Lterlity Dte  . CESREN SECTION    . IRRIGTION ND DEBRIDEMENT BSCESS N/ 09/27/2016   Procedure: IRRIGTION ND DEBRIDEMENT OF BDOMINL WLL BSCESS;  Surgeon: Greer Pickerel, MD;  Loction: [facility];  Service: Generl;  Lterlity: N/;  . MXIMUM CCESS (MS)POSTERIOR LUMBR INTERBODY FUSION (PLIF)  1 LEVEL N/ 01/06/2016   Procedure: FOR MXIMUM CCESS (MS) POSTERIOR LUMBR INTERBODY FUSION (PLIF) LUMBR FOUR-FIVE;  Surgeon: Kevn Ny Ditty, MD;  Loction: [facility];  Service: Neurosurgery;  Lterlity: N/;     reports tht she hs never smoked. She hs never used smokeless tobcco. She reports tht she does not drink lcohol or use drugs.  No Known llergies  Fmily History  Problem Reltion ge of Onset  . Congestive Hert Filure Fther     Prior to dmission medictions   Mediction Sig Strt Dte End Dte Tking? uthorizing Provider  torsemide (DEMDEX) 20 MG tblet Tke 20 mg by mouth dily.   Yes Provider, Historicl, MD  cetminophen (TYLENOL) 500 MG tblet Tke 500 mg by mouth every 6 (six) hours s needed (pin).    Provider, Historicl, MD  metoprolol trtrte (LOPRESSOR) 25 MG tblet Tke 0.5 tblets (12.5 mg totl) by mouth 2 (two) times dily. 09/06/16   Rosit Fire, MD  polyethylene glycol [facility] / Flori Rveling) pcket Tke 17 g by mouth dily s needed for mild constiption. 09/30/16   Debbe Ode, MD  potssium chloride (K-DUR) 10 MEQ tblet Tke 1 tblet (10 mEq totl) by mouth dily for 10 dys. 06/06/17 06/16/17  Cccvle, Sophi, P-C  Vitmin D, Ergoclciferol, (DRISDOL) 50000 units CPS cpsule Tke 50,000 Units by mouth every Mondy. 08/09/16   Provider, Historicl, MD    Physicl Exm: Vitls:   08/27/18 0623 08/27/18 0530 08/27/18 7628 08/27/18 3151  BP: (!) 90/58 96/72 107/74 119/63  Pulse: (!) 105 (!) 112 (!) 108 (!) 105  Resp:    16  Temp:    98.4 F (36.9 C)  TempSrc:    Oral  SpO2: 95% 93% 96% 99%  Weight:      Height:        Constitutional: ND, calm, comfortable Vitals:   08/27/18 0509 08/27/18 0530 08/27/18 0545 08/27/18 0639  BP: (!) 90/58 96/72 107/74 119/63  Pulse: (!) 105 (!) 112 (!) 108 (!) 105  Resp:    16  Temp:    98.4 F (36.9 C)  TempSrc:    Oral  SpO2: 95% 93% 96% 99%  Weight:      Height:        Eyes: PERRL, lids and conjunctivae normal ENMT: Mucous membranes are moist. Posterior pharynx clear of any exudate or lesions.Normal dentition.  Neck: normal, supple, no masses, no thyromegaly Respiratory: clear to auscultation bilaterally, no wheezing, no crackles. Normal respiratory effort. No accessory muscle use.  Cardiovascular: Regular rate and rhythm, no murmurs / rubs / gallops. Chronic bilateral LE edema. bdomen: protuberant, soft, nontender.  L groin with area of induration and erythema.  TTP.  Musculoskeletal: no clubbing / cyanosis. No joint deformity upper and lower extremities. Good ROM, no contractures. Normal muscle tone.  Skin: no rashes, lesions, ulcers. No induration Neurologic: CN 2-12 grossly intact. Sensation intact.  Psychiatric: Normal judgment and insight. lert and oriented x 3. Normal mood.   Labs on dmission: I have personally reviewed following labs and imaging studies  CBC: Recent Labs  Lab 08/27/18 0305  WBC 16.3*  NEUTROBS 12.6*  HGB 12.1  HCT 37.8  MCV 94.3  PLT 185   Basic Metabolic Panel: Recent Labs  Lab 08/27/18 0305  N 135  K 3.8  CL 101  CO2 24  GLUCOSE 118*  BUN 35*  CRETININE 1.97*  CLCIUM 8.7*   GFR: Estimated Creatinine Clearance: 45.4 mL/min () (by C-G formula based on SCr of 1.97 mg/dL (H)). Liver Function Tests: No results for input(s): ST, LT, LKPHOS, BILITOT, PROT, LBUMIN in the last 168 hours. No results for input(s): LIPSE, MYLSE in the last 168 hours. No results for input(s): MMONI in the last 168 hours. Coagulation Profile: No results for input(s): INR, PROTIME in the last 168 hours. Cardiac Enzymes: No results for input(s): CKTOTL, CKMB, CKMBINDEX, TROPONINI in the last 168 hours. BNP (last 3 results) No results for input(s): PROBNP in the last 8760 hours. Hb1C: No results for input(s): HGB1C in the last 72 hours. CBG: No results for input(s): GLUCP in the last 168 hours. Lipid  Profile: No results for input(s): CHOL, HDL, LDLCLC, TRIG, CHOLHDL, LDLDIRECT in the last 72 hours. Thyroid Function Tests: No results for input(s): TSH, T4TOTL, FREET4, T3FREE, THYROIDB in the last 72 hours. nemia Panel: No results for input(s): VITMINB12, FOLTE, FERRITIN, TIBC, IRON, RETICCTPCT in the last 72 hours. Urine analysis:    Component Value Date/Time   COLORURINE YELLOW 09/23/2016 1015   PPERNCEUR CLOUDY () 09/23/2016 1015   LBSPEC 1.011 09/23/2016 1015   PHURINE 5.0 09/23/2016 1015   GLUCOSEU NEGTIVE 09/23/2016 1015   HGBUR MODERTE () 09/23/2016 1015   BILIRUBINUR NEGTIVE 09/23/2016 1015   KETONESUR NEGTIVE 09/23/2016 1015   PROTEINUR NEGTIVE 09/23/2016 1015   NITRITE NEGTIVE 09/23/2016 1015   LEUKOCYTESUR SMLL () 09/23/2016 1015    Radiological Exams on dmission: Ct bdomen Pelvis W Contrast  ddendum Date: 08/27/2018   DDENDUM **Note De-Identified vi Obfusction** REPORT: 08/27/2018 04:32 ADDENDUM: Add to IMPRESSION: Thickened urinry bldder wll,  finding felt to be indictive of  degree of cystitis. Electroniclly Signed   By: Lowell Grip III M.D.   On: 08/27/2018 04:32   Result Dte: 08/27/2018 CLINICAL DATA:  Abdominl pin, primrily left-sided EXAM: CT ABDOMEN AND PELVIS WITH CONTRAST TECHNIQUE: Multidetector CT imging of the bdomen nd pelvis ws performed using the stndrd protocol following bolus dministrtion of intrvenous contrst. CONTRAST:  74mL ISOVUE-300 IOPAMIDOL (ISOVUE-300) INJECTION 61% COMPARISON:  None. FINDINGS: Lower chest: There is bibsilr telecttic chnge. Heptobiliry: No focl liver lesions re evident. Gllbldder ppers contrcted. No biliry duct dilttion evident. Pncres: No pncretic mss or inflmmtory focus. Spleen: No splenic lesions re evident. Adrenls/Urinry Trct: Right drenl ppers norml. There is  mss rising from the left drenl mesuring 2.3 x 2.2 cm. The ttenution of this mss does not indicte tht it  represents n denom. There is  4 mm probble cyst in the upper pole left kidney. There is no evident hydronephrosis on either side. There is no evident renl or ureterl clculus on either side. The urinry bldder wll is thickened. Stomch/Bowel: The rectum is borderline distended with stool. There is no bowel wll or mesenteric thickening. No diverticulitis evident. There is no bowel obstruction. There is no evident free ir or portl venous ir. There is lipomtous infiltrtion of the ileocecl vlve. Vsculr/Lymphtic: There is no bdominl ortic neurysm. No vsculr lesions re pprecible. There is no denopthy in the bdomen or pelvis. Reproductive: The uterus is nteverted.  No pelvic mss is evident. Other: Appendix ppers unremrkble. No bscess or scites is evident in the peritoneum or retroperitoneum of the bdomen nd pelvis. There is  smll ventrl herni contining only ft. There is soft tissue strnding in the left inguinl region. There is ill-defined fluid in the subcutneous region of the left inguinl region this ill-defined collection mesures 3.8 x 3.6 x 3.3 cm. Musculoskeletl: There is postopertive chnge in the lumbr spine t L4 nd L5. There is degenertive chnge in the lower lumbr spine. There re no blstic or lytic bone lesions. No intrmusculr lesions re evident. IMPRESSION: 1. There is inflmmtory chnge with pprent developing phlegmon in the medil upper left thigh/left inguinl region. No ir is seen in this re. 2. No evident diverticulitis. No bowel obstruction or bowel wll thickening. No bscess within the peritoneum or retroperitoneum of the bdomen nd pelvis. Appendix ppers norml. 3. There is  left drenl mss mesuring 2.3 x 2.2 cm. Etiology uncertin. Nonemergent drenl MR to further evlute this mss pre nd post-contrst my well be dvisble. 4.  No evident renl or ureterl clculus.  No hydronephrosis. 5.  Postopertive chnge t L4 nd L5.  Electroniclly Signed: By: Lowell Grip III M.D. On: 08/27/2018 04:25    EKG: Independently reviewed. none  Assessment/Pln Active Problems:   Cellulitis  Cellulitis  Developing Phlegmon:  CT with inflmmtory chnge in upper L thigh/L inguinl region.  Per signout, discussed with surgery who noted s no bscess, no indiction for surgery t this time (this is not documented in EDP note).  She hs hx of phlegmon/evolving bscess in 09/2016 which required I&D by surgery.   Follow blood cx Continue vnc/ceftrixone Consider reimging/rediscussion with surgery bsed on exm  Left Adrenl Mss: needs evlution by drenl MR pre nd post contrst   AKI on CKD III: pt cretinine slightly elevted tody.  Will hold lsix/spiro.  IVF tody.  Follow.   Leukocytosis  Tchycrdi  SIRS: mild tachy, follow EKG, follow with IVF.  Pt does not appear septic, but likely 2/2 cellulitis/developing phlegmon above.  Imaging findings concerning for cystitis: on abx, follow urinalysis, no convincing sx  History of PE  RLE DVT: pt notes she's no longer on eliquis, states this was discontinued this past December.  PE and RLE DVT in 08/2016.    Hypertension: continue metoprolol at 12.5 mg BID.  She's also on spironolactone, but isn't sure what dose.  Hold torsemide.  Lower Extremity Edema: chronic, holding spironolactone and torsemide at this point.  Follow UA and albumin.    Gout: continue colchicine BID.  She's on allopurinol as well, not sure what dose (will start at 100 mg and follow med rec)  Birth control: tells me she's on norethindrone now, POP, follow med rec (prior to VTE, sounds like she was on COC)  Body mass index is 48.89 kg/m.  Med rec needs to be updated.  She's not sure of many of the doses of her meds.  She tells me she's on 5 medications.  Torsemide 20 mg.  Allopurinol, unsure of dose.  Mitigare (colchicine), BID, but unable to tell me dose.  Norethindrone.  And metoprolol 12.5  mg BID.  She relays that she's stopped the eliquis recently under her physicians directions.  DVT prophylaxis: lovenox  Code Status: full  Family Communication: none at bedside  Disposition Plan: pending improvement  Consults called: none  Admission status: observation    Fayrene Helper MD [facility] Pager [email or web address]  If 7PM-7AM, please contact night-coverage [email or web address] Password [email or web address]  08/27/2018, 8:00 AM

## 2018-08-27 NOTE — Progress Notes (Signed)
Pt's heart rate shows elevated on dinamap. However manual readings verified by 2 RN's with apical and radial readings. Manual readings continue to record HR of 88-90 and regular. Pt denies any symptoms. Will continue to monitor

## 2018-08-27 NOTE — ED Notes (Signed)
Report given to Barbara J, RN. Care link 

## 2018-08-27 NOTE — ED Notes (Signed)
Per radiology protocol, waiting on pregnancy test prior to performing CT  Pt to receive 80% IV dosage, due to low GFR.

## 2018-08-27 NOTE — ED Notes (Signed)
Single set of blood cultures drawn prior to administration of IV abx.

## 2018-08-27 NOTE — ED Notes (Signed)
EDP at bedside. Pt states she feels she may have "another infection in my abdomen". States she has burning pain "from the midsection of my bikini area to my hip". Also states "I'm swollen down there".  Denies dysuria or painful BMs. States she has foul odor with urination but no pain. Denies constipation. Last sexual contact 4-5 wks ago. Denies vaginal bleeding or discharge. Pt pinpoints pain to LLQ.

## 2018-08-27 NOTE — Plan of Care (Signed)
  Problem: Pain Goal: Understanding of pain management will improve Outcome: Progressing

## 2018-08-27 NOTE — ED Triage Notes (Signed)
Pt reports LLQ pain x several days. Reports that she is unsure if she is on her period or not-denies vaginal discharge. Reports a 'funky smell'.  Denies dysuria. Denies N/V.

## 2018-08-27 NOTE — ED Notes (Signed)
Pt up to bathroom to try to provide urine sample.

## 2018-08-27 NOTE — Progress Notes (Signed)
Pharmacy Antibiotic Note  Caitlin Taylor is a 48 y.o. female admitted on 08/27/2018 with cellulitis.  Pharmacy has been consulted for vancomycin dosing.  Pt is 48 year old female with PMH CKD, hx VTE, HTN - presenting left groin pain/cellulitis. Pharmacy consulted to dose vancomycin.  NKDA.  Today, 08/27/18  WBC 16,3 - elevated  Afebrile  SCr 1.91, CrCl ~47 mL/min. Baseline SCr appears to be ~1.7, so slightly elevated on admission  Plan:  Ceftriaxone 2 g IV daily per MD  Vancomycin 2000 mg loading dose followed by 750 mg IV q24h for estimated AUC 407  Goal AUC 400-500  Closely monitor renal function for any dose adjustments. Monitor culture data for ability to de-escalate if MRSA can be ruled out.   Check vancomycin levels at steady state if indicated  Height: 5\' 3"  (160 cm) Weight: 276 lb (125.2 kg) IBW/kg (Calculated) : 52.4  Temp (24hrs), Avg:99.3 F (37.4 C), Min:98.4 F (36.9 C), Max:99.8 F (37.7 C)  Recent Labs  Lab 08/27/18 0305 08/27/18 0832  WBC 16.3* 15.9*  CREATININE 1.97* 1.91*    Estimated Creatinine Clearance: 46.8 mL/min (A) (by C-G formula based on SCr of 1.91 mg/dL (H)).    No Known Allergies  Antimicrobials this admission: vancomycin 2/16 >>  ceftriaxone 2/16 >>   Dose adjustments this admission:  Microbiology results: 2/16 BCx: Sent  Thank you for allowing pharmacy to be a part of this patient's care.  Lenis Noon, PharmD 08/27/2018 12:26 PM

## 2018-08-27 NOTE — ED Provider Notes (Signed)
[facility] EMERGENCY DEPARTMENT Provider Note   CSN: [id number] Arrival date & time: 08/27/18  0235     History   Chief Complaint Chief Complaint  Patient presents with  . Abdominal Pain    HPI Caitlin Taylor is a 48 y.o. female.  Patient is a 48 year old female with history of morbid obesity, prior PE, DVT, chronic back pain.  She presents today with complaints of burning and pain in her left lower abdomen.  This has been worsening over the past week.  She denies any dysuria, changes in bowel habits, or vaginal discharge.  She denies any fevers or chills.  Feels similar to when she was admitted for an abdominal wall abscess in 2018.  This required intervention by general surgery and a drain placement.  The history is provided by the patient.  Abdominal Pain  Pain location:  LLQ Pain quality: burning   Pain radiates to:  Groin Pain severity:  Moderate Onset quality:  Gradual Duration:  1 week Timing:  Constant Progression:  Worsening Chronicity:  New Relieved by:  Nothing Worsened by:  Nothing Ineffective treatments:  None tried   Past Medical History:  Diagnosis Date  . Chronic back pain   . CKD (chronic kidney disease) stage 2, GFR 60-89 ml/min   . DVT (deep venous thrombosis) ([place])   . Hypertension   . Lumbar stenosis   . Morbid obesity ([place])   . Pulmonary embolism ([place])    a. diagnosed in 08/2016. Started on Eliquis    Patient Active Problem List   Diagnosis Date Noted  . Abdominal wall abscess 09/30/2016  . Right leg DVT ([place]) 09/30/2016  . AKI (acute kidney injury) ([place]) 09/22/2016  . Chronic diastolic CHF (congestive heart failure) ([place]) 09/22/2016  . Acute deep vein thrombosis (DVT) of proximal vein of right lower extremity ([place])   . Edema 09/04/2016  . History of pulmonary embolus (PE) 09/03/2016  . Benign essential HTN   . Hypokalemia   . Itching   . Swelling   . CKD (chronic kidney disease), stage III ([place])   . Acute lower UTI     . Leukocytosis   . Lumbar myelopathy ([place]) 01/12/2016  . Low back pain   . Lumbar stenosis   . S/P lumbar fusion   . Post-operative pain   . Normocytic anemia   . Constipation due to pain medication   . Morbid obesity due to excess calories ([place])   . Back pain 01/05/2016  . Renal insufficiency 01/05/2016  . Hyperglycemia 01/05/2016  . Lumbar radiculopathy 01/05/2016  . UTI (urinary tract infection) 01/05/2016  . Essential hypertension 01/05/2016    Past Surgical History:  Procedure Laterality Date  . CESAREAN SECTION    . IRRIGATION AND DEBRIDEMENT ABSCESS N/A 09/27/2016   Procedure: IRRIGATION AND DEBRIDEMENT OF ABDOMINAL WALL ABSCESS;  Surgeon: Greer Pickerel, MD;  Location: [facility];  Service: General;  Laterality: N/A;  . MAXIMUM ACCESS (MAS)POSTERIOR LUMBAR INTERBODY FUSION (PLIF) 1 LEVEL N/A 01/06/2016   Procedure: FOR MAXIMUM ACCESS (MAS) POSTERIOR LUMBAR INTERBODY FUSION (PLIF) LUMBAR FOUR-FIVE;  Surgeon: Kevan Ny Ditty, MD;  Location: [facility];  Service: Neurosurgery;  Laterality: N/A;     OB History   No obstetric history on file.      Home Medications    Prior to Admission medications   Medication Sig Start Date End Date Taking? Authorizing Provider  acetaminophen (TYLENOL) 500 MG tablet Take 500 mg by mouth every 6 (six) hours as needed (  pain).    [provider]  apixaban (ELIQUIS) 5 MG TABS tablet Take 1 tablet (5 mg total) by mouth 2 (two) times daily. Please take 10 mg twice a day for 7 days and then 5 mg twice a day Patient taking differently: Take 5 mg by mouth 2 (two) times daily.  09/12/16   Rosita Fire, MD  bisacodyl (DULCOLAX) 5 MG EC tablet Take 1 tablet (5 mg total) by mouth daily as needed for moderate constipation. 09/30/16   Debbe Odea, MD  furosemide (LASIX) 80 MG tablet Take 1 tablet (80 mg total) by mouth daily. 09/06/16   Rosita Fire, MD  HYDROcodone-acetaminophen (NORCO/VICODIN) 5-325 MG tablet Take 1-2 tablets  by mouth every 6 (six) hours as needed for severe pain. 08/19/17   Emeline General, PA-C  lisinopril-hydrochlorothiazide (PRINZIDE,ZESTORETIC) 20-25 MG tablet Take 1 tablet by mouth daily. 08/10/16   [provider]  metoprolol tartrate (LOPRESSOR) 25 MG tablet Take 0.5 tablets (12.5 mg total) by mouth 2 (two) times daily. 09/06/16   Rosita Fire, MD  polyethylene glycol [facility] / Floria Raveling) packet Take 17 g by mouth daily as needed for mild constipation. 09/30/16   Debbe Odea, MD  potassium chloride (K-DUR) 10 MEQ tablet Take 1 tablet (10 mEq total) by mouth daily for 10 days. 06/06/17 06/16/17  Caccavale, Sophia, PA-C  Vitamin D, Ergocalciferol, (DRISDOL) 50000 units CAPS capsule Take 50,000 Units by mouth every Monday. 08/09/16   [provider]    Family History Family History  Problem Relation Age of Onset  . Congestive Heart Failure Father     Social History Social History   Tobacco Use  . Smoking status: Never Smoker  . Smokeless tobacco: Never Used  Substance Use Topics  . Alcohol use: No  . Drug use: No     Allergies   Patient has no known allergies.   Review of Systems Review of Systems  Gastrointestinal: Positive for abdominal pain.  All other systems reviewed and are negative.    Physical Exam Updated Vital Signs BP 104/72 (BP Location: Right Arm)   Pulse (!) 127   Temp 99.8 F (37.7 C) (Oral)   Resp 20   Ht 5\' 3"  (1.6 m)   Wt 125.2 kg   SpO2 100%   BMI 48.89 kg/m   Physical Exam Vitals signs and nursing note reviewed.  Constitutional:      General: She is not in acute distress.    Appearance: She is well-developed. She is not diaphoretic.  HENT:     Head: Normocephalic and atraumatic.  Neck:     Musculoskeletal: Normal range of motion and neck supple.  Cardiovascular:     Rate and Rhythm: Normal rate and regular rhythm.     Heart sounds: No murmur. No friction rub. No gallop.   Pulmonary:     Effort: Pulmonary  effort is normal. No respiratory distress.     Breath sounds: Normal breath sounds. No wheezing.  Abdominal:     General: Bowel sounds are normal. There is no distension.     Palpations: Abdomen is soft.     Tenderness: There is no abdominal tenderness.  Musculoskeletal: Normal range of motion.  Skin:    General: Skin is warm and dry.     Comments: There is redness, warmth, induration, and fluctuance to the skin fold in the left lower quadrant.  This extends into the groin/suprapubic region.  Neurological:     Mental Status: She is alert  and oriented to person, place, and time.      ED Treatments / Results  Labs (all labs ordered are listed, but only abnormal results are displayed) Labs Reviewed  BASIC METABOLIC PANEL  CBC WITH DIFFERENTIAL/PLATELET  URINALYSIS, ROUTINE W REFLEX MICROSCOPIC  PREGNANCY, URINE    EKG None  Radiology No results found.  Procedures Procedures (including critical care time)  Medications Ordered in ED Medications - No data to display   Initial Impression / Assessment and Plan / ED Course  I have reviewed the triage vital signs and the nursing notes.  Pertinent labs & imaging results that were available during my care of the patient were reviewed by me and considered in my medical decision making (see chart for details).  Patient presents here with pain and swelling in her left lower abdomen.  She has an abdominal wall cellulitis with possible developing phlegmon on CT scan.  She will be given IV antibiotics.  Patient had a similar episode in 2018 that required general anesthesia with surgical debridement and drain placement.  For this reason, I feel as though aggressive treatment with IV antibiotics is indicated.  Patient was given vancomycin and Rocephin here and will be admitted to the hospitalist service at [facility] long under the care of Dr. Shanon Brow.  Final Clinical Impressions(s) / ED Diagnoses   Final diagnoses:  None    ED Discharge  Orders    None       Veryl Speak, MD 08/27/18 [phone]

## 2018-08-28 DIAGNOSIS — I96 Gangrene, not elsewhere classified: Secondary | ICD-10-CM | POA: Diagnosis present

## 2018-08-28 DIAGNOSIS — L02214 Cutaneous abscess of groin: Secondary | ICD-10-CM | POA: Diagnosis present

## 2018-08-28 DIAGNOSIS — L03116 Cellulitis of left lower limb: Secondary | ICD-10-CM | POA: Diagnosis present

## 2018-08-28 DIAGNOSIS — Z6841 Body Mass Index (BMI) 40.0 and over, adult: Secondary | ICD-10-CM | POA: Diagnosis not present

## 2018-08-28 DIAGNOSIS — G8929 Other chronic pain: Secondary | ICD-10-CM | POA: Diagnosis present

## 2018-08-28 DIAGNOSIS — L0291 Cutaneous abscess, unspecified: Secondary | ICD-10-CM | POA: Diagnosis not present

## 2018-08-28 DIAGNOSIS — E875 Hyperkalemia: Secondary | ICD-10-CM | POA: Diagnosis not present

## 2018-08-28 DIAGNOSIS — I10 Essential (primary) hypertension: Secondary | ICD-10-CM | POA: Diagnosis not present

## 2018-08-28 DIAGNOSIS — I130 Hypertensive heart and chronic kidney disease with heart failure and stage 1 through stage 4 chronic kidney disease, or unspecified chronic kidney disease: Secondary | ICD-10-CM | POA: Diagnosis present

## 2018-08-28 DIAGNOSIS — R1110 Vomiting, unspecified: Secondary | ICD-10-CM | POA: Diagnosis present

## 2018-08-28 DIAGNOSIS — L02416 Cutaneous abscess of left lower limb: Secondary | ICD-10-CM | POA: Diagnosis present

## 2018-08-28 DIAGNOSIS — T368X5A Adverse effect of other systemic antibiotics, initial encounter: Secondary | ICD-10-CM | POA: Diagnosis present

## 2018-08-28 DIAGNOSIS — I5032 Chronic diastolic (congestive) heart failure: Secondary | ICD-10-CM | POA: Diagnosis present

## 2018-08-28 DIAGNOSIS — L03314 Cellulitis of groin: Secondary | ICD-10-CM | POA: Diagnosis present

## 2018-08-28 DIAGNOSIS — N170 Acute kidney failure with tubular necrosis: Secondary | ICD-10-CM | POA: Diagnosis not present

## 2018-08-28 DIAGNOSIS — B9620 Unspecified Escherichia coli [E. coli] as the cause of diseases classified elsewhere: Secondary | ICD-10-CM | POA: Diagnosis present

## 2018-08-28 DIAGNOSIS — Z23 Encounter for immunization: Secondary | ICD-10-CM | POA: Diagnosis not present

## 2018-08-28 DIAGNOSIS — E279 Disorder of adrenal gland, unspecified: Secondary | ICD-10-CM | POA: Diagnosis present

## 2018-08-28 DIAGNOSIS — Z86711 Personal history of pulmonary embolism: Secondary | ICD-10-CM | POA: Diagnosis not present

## 2018-08-28 DIAGNOSIS — N179 Acute kidney failure, unspecified: Secondary | ICD-10-CM | POA: Diagnosis present

## 2018-08-28 DIAGNOSIS — L02215 Cutaneous abscess of perineum: Secondary | ICD-10-CM | POA: Diagnosis present

## 2018-08-28 DIAGNOSIS — L03311 Cellulitis of abdominal wall: Secondary | ICD-10-CM | POA: Diagnosis present

## 2018-08-28 DIAGNOSIS — B954 Other streptococcus as the cause of diseases classified elsewhere: Secondary | ICD-10-CM | POA: Diagnosis present

## 2018-08-28 DIAGNOSIS — S70322A Blister (nonthermal), left thigh, initial encounter: Secondary | ICD-10-CM | POA: Diagnosis present

## 2018-08-28 DIAGNOSIS — M549 Dorsalgia, unspecified: Secondary | ICD-10-CM | POA: Diagnosis present

## 2018-08-28 DIAGNOSIS — M109 Gout, unspecified: Secondary | ICD-10-CM | POA: Diagnosis present

## 2018-08-28 DIAGNOSIS — N183 Chronic kidney disease, stage 3 (moderate): Secondary | ICD-10-CM | POA: Diagnosis present

## 2018-08-28 DIAGNOSIS — L089 Local infection of the skin and subcutaneous tissue, unspecified: Secondary | ICD-10-CM | POA: Diagnosis not present

## 2018-08-28 LAB — COMPREHENSIVE METABOLIC PANEL
ALT: 38 U/L (ref 0–44)
AST: 20 U/L (ref 15–41)
Albumin: 2.9 g/dL — ABNORMAL LOW (ref 3.5–5.0)
Alkaline Phosphatase: 81 U/L (ref 38–126)
Anion gap: 11 (ref 5–15)
BUN: 37 mg/dL — ABNORMAL HIGH (ref 6–20)
CO2: 23 mmol/L (ref 22–32)
CREATININE: 2.4 mg/dL — AB (ref 0.44–1.00)
Calcium: 8.5 mg/dL — ABNORMAL LOW (ref 8.9–10.3)
Chloride: 103 mmol/L (ref 98–111)
GFR calc Af Amer: 27 mL/min — ABNORMAL LOW (ref >60)
GFR calc non Af Amer: 23 mL/min — ABNORMAL LOW (ref >60)
Glucose, Bld: 114 mg/dL — ABNORMAL HIGH (ref 70–99)
Potassium: 4.2 mmol/L (ref 3.5–5.1)
Sodium: 137 mmol/L (ref 135–145)
Total Bilirubin: 0.9 mg/dL (ref 0.3–1.2)
Total Protein: 5.9 g/dL — ABNORMAL LOW (ref 6.5–8.1)

## 2018-08-28 LAB — CBC
HCT: 32.7 % — ABNORMAL LOW (ref 36.0–46.0)
Hemoglobin: 10.5 g/dL — ABNORMAL LOW (ref 12.0–15.0)
MCH: 30.3 pg (ref 26.0–34.0)
MCHC: 32.1 g/dL (ref 30.0–36.0)
MCV: 94.2 fL (ref 80.0–100.0)
PLATELETS: 113 10*3/uL — AB (ref 150–400)
RBC: 3.47 MIL/uL — ABNORMAL LOW (ref 3.87–5.11)
RDW: 14.2 % (ref 11.5–15.5)
WBC: 14.8 10*3/uL — ABNORMAL HIGH (ref 4.0–10.5)
nRBC: 0 % (ref 0.0–0.2)

## 2018-08-28 LAB — URINALYSIS, ROUTINE W REFLEX MICROSCOPIC
Bilirubin Urine: NEGATIVE
Glucose, UA: NEGATIVE mg/dL
Ketones, ur: NEGATIVE mg/dL
Nitrite: NEGATIVE
Protein, ur: 30 mg/dL — AB
Specific Gravity, Urine: 1.023 (ref 1.005–1.030)
pH: 5 (ref 5.0–8.0)

## 2018-08-28 LAB — HIV ANTIBODY (ROUTINE TESTING W REFLEX): HIV Screen 4th Generation wRfx: NONREACTIVE

## 2018-08-28 MED ORDER — ONDANSETRON HCL 4 MG/2ML IJ SOLN
4.0000 mg | Freq: Four times a day (QID) | INTRAMUSCULAR | Status: DC | PRN
  Administered 2018-08-28 – 2018-09-10 (×2): 4 mg via INTRAVENOUS
  Filled 2018-08-28 (×3): qty 2

## 2018-08-28 MED ORDER — VANCOMYCIN VARIABLE DOSE PER UNSTABLE RENAL FUNCTION (PHARMACIST DOSING): Status: DC

## 2018-08-28 MED ORDER — LIDOCAINE-EPINEPHRINE 2 %-1:100000 IJ SOLN
30.0000 mL | Freq: Once | INTRAMUSCULAR | Status: AC
  Administered 2018-08-28: 30 mL via INTRADERMAL
  Filled 2018-08-28: qty 30

## 2018-08-28 MED ORDER — HYDROMORPHONE HCL 1 MG/ML IJ SOLN
1.0000 mg | Freq: Once | INTRAMUSCULAR | Status: AC
  Administered 2018-08-28: 0.5 mg via INTRAVENOUS
  Filled 2018-08-28: qty 1

## 2018-08-28 NOTE — Consult Note (Addendum)
[facility] Consult Note  Caitlin Taylor 02-04-1971  [id number].    Requesting MD: Wynelle Cleveland Chief Complaint/Reason for Consult: L groin abscess/cellulitis  HPI:  Patient is a 48 year old female who presented with L groin pain and swelling that has been going on for a few days. Feels burning and is worse when moving LLE. Patient reports noticing it felt hot and she noted more swelling yesterday. Patient notes some drainage today and some foul odor. Denies fever, chills, chest pain, increased SOB, nausea or vomiting, denies urinary symptoms. PMH significant for Hx of DVT/PE, SOB on exertion, HTN, CKD stage II, chronic back pain, morbid obesity. Patient had a similar abscess that was drained back in 09/2016. NKDA. Patient was on eliquis for PE/DVT but has been off for several months.   ROS: Review of Systems  Constitutional: Negative for chills, fever and malaise/fatigue.  Respiratory: Negative for shortness of breath.   Cardiovascular: Negative for chest pain, palpitations and orthopnea.  Gastrointestinal: Negative for constipation, diarrhea, nausea and vomiting.  Genitourinary: Negative for dysuria, frequency and urgency.       L groin pain and swelling with odor  Neurological: Negative for tingling.  All other systems reviewed and are negative.   Family History  Problem Relation Age of Onset  . Congestive Heart Failure Father     Past Medical History:  Diagnosis Date  . Chronic back pain   . CKD (chronic kidney disease) stage 2, GFR 60-89 ml/min   . DVT (deep venous thrombosis) ([place])   . Hypertension   . Lumbar stenosis   . Morbid obesity ([place])   . Pulmonary embolism ([place])    a. diagnosed in 08/2016. Started on Eliquis    Past Surgical History:  Procedure Laterality Date  . CESAREAN SECTION    . IRRIGATION AND DEBRIDEMENT ABSCESS N/A 09/27/2016   Procedure: IRRIGATION AND DEBRIDEMENT OF ABDOMINAL WALL ABSCESS;  Surgeon: Greer Pickerel, MD;  Location: [facility];   Service: General;  Laterality: N/A;  . MAXIMUM ACCESS (MAS)POSTERIOR LUMBAR INTERBODY FUSION (PLIF) 1 LEVEL N/A 01/06/2016   Procedure: FOR MAXIMUM ACCESS (MAS) POSTERIOR LUMBAR INTERBODY FUSION (PLIF) LUMBAR FOUR-FIVE;  Surgeon: Kevan Ny Ditty, MD;  Location: [facility];  Service: Neurosurgery;  Laterality: N/A;    Social History:  reports that she has never smoked. She has never used smokeless tobacco. She reports that she does not drink alcohol or use drugs.  Allergies: No Known Allergies  Medications Prior to Admission  Medication Sig Dispense Refill  . acetaminophen (TYLENOL) 325 MG tablet Take 975 mg by mouth every 6 (six) hours as needed for moderate pain.    Marland Kitchen allopurinol (ZYLOPRIM) 100 MG tablet Take 100 mg by mouth daily.    . metoprolol tartrate (LOPRESSOR) 25 MG tablet Take 0.5 tablets (12.5 mg total) by mouth 2 (two) times daily. 60 tablet 0  . MITIGARE 0.6 MG CAPS Take 0.6 mg by mouth 2 (two) times daily.    . norethindrone (MICRONOR,CAMILA,ERRIN) 0.35 MG tablet Take 1 tablet by mouth at bedtime.    . polyethylene glycol (MIRALAX / GLYCOLAX) packet Take 17 g by mouth daily as needed for mild constipation. 14 each 0  . spironolactone (ALDACTONE) 25 MG tablet Take 25 mg by mouth daily.    Marland Kitchen torsemide (DEMADEX) 20 MG tablet Take 20 mg by mouth daily.    . Vitamin D, Ergocalciferol, (DRISDOL) 50000 units CAPS capsule Take 50,000 Units by mouth every Sunday.     . potassium  chloride (K-DUR) 10 MEQ tablet Take 1 tablet (10 mEq total) by mouth daily for 10 days. (Patient not taking: Reported on 08/27/2018) 10 tablet 0    Blood pressure 104/67, pulse 86, temperature 98.3 F (36.8 C), temperature source Oral, resp. rate 16, height 5\' 3"  (1.6 m), weight 125.2 kg, SpO2 94 %. Physical Exam: Physical Exam Constitutional:      General: She is not in acute distress.    Appearance: She is obese. She is not toxic-appearing.  HENT:     Head: Normocephalic and atraumatic.     Right  Ear: External ear normal.     Left Ear: External ear normal.     Nose: Nose normal.     Mouth/Throat:     Lips: Pink.     Mouth: Mucous membranes are moist.  Eyes:     General: Lids are normal. No scleral icterus.    Extraocular Movements: Extraocular movements intact.     Conjunctiva/sclera: Conjunctivae normal.  Neck:     Musculoskeletal: Normal range of motion and neck supple.  Cardiovascular:     Rate and Rhythm: Normal rate and regular rhythm.     Pulses:          Radial pulses are 2+ on the right side and 2+ on the left side.       Dorsalis pedis pulses are 1+ on the right side and 1+ on the left side.  Pulmonary:     Effort: Pulmonary effort is normal.     Breath sounds: Normal breath sounds.  Abdominal:     General: There is no distension.     Palpations: Abdomen is soft.     Tenderness: There is no abdominal tenderness. There is no guarding or rebound.  Genitourinary:    Comments: L groin erythema with induration and central fluctuance, small amount of purulent drainage expressed, TTP Musculoskeletal:     Comments: ROM at L hip slightly limited by pain in L groin. ROM grossly intact in bilateral UEs and RLE  Skin:    General: Skin is warm.     Findings: No rash.  Neurological:     Mental Status: She is alert and oriented to person, place, and time.  Psychiatric:        Attention and Perception: Attention and perception normal.        Mood and Affect: Mood and affect normal.        Speech: Speech normal.        Behavior: Behavior is cooperative.     Results for orders placed or performed during the hospital encounter of 08/27/18 (from the past 48 hour(s))  Basic metabolic panel     Status: Abnormal   Collection Time: 08/27/18  3:05 AM  Result Value Ref Range   Sodium 135 135 - 145 mmol/L   Potassium 3.8 3.5 - 5.1 mmol/L   Chloride 101 98 - 111 mmol/L   CO2 24 22 - 32 mmol/L   Glucose, Bld 118 (H) 70 - 99 mg/dL   BUN 35 (H) 6 - 20 mg/dL   Creatinine, Ser 1.97  (H) 0.44 - 1.00 mg/dL   Calcium 8.7 (L) 8.9 - 10.3 mg/dL   GFR calc non Af Amer 30 (L) >60 mL/min   GFR calc Af Amer 34 (L) >60 mL/min   Anion gap 10 5 - 15    Comment: Performed at [facility], [address] [place], [place] [address]  CBC with Differential  Status: Abnormal   Collection Time: 08/27/18  3:05 AM  Result Value Ref Range   WBC 16.3 (H) 4.0 - 10.5 K/uL   RBC 4.01 3.87 - 5.11 MIL/uL   Hemoglobin 12.1 12.0 - 15.0 g/dL   HCT 37.8 36.0 - 46.0 %   MCV 94.3 80.0 - 100.0 fL   MCH 30.2 26.0 - 34.0 pg   MCHC 32.0 30.0 - 36.0 g/dL   RDW 13.9 11.5 - 15.5 %   Platelets 150 150 - 400 K/uL   nRBC 0.0 0.0 - 0.2 %   Neutrophils Relative % 79 %   Neutro Abs 12.6 (H) 1.7 - 7.7 K/uL   Lymphocytes Relative 3 %   Lymphs Abs 0.5 (L) 0.7 - 4.0 K/uL   Monocytes Relative 11 %   Monocytes Absolute 1.9 (H) 0.1 - 1.0 K/uL   Eosinophils Relative 0 %   Eosinophils Absolute 0.0 0.0 - 0.5 K/uL   Basophils Relative 0 %   Basophils Absolute 0.1 0.0 - 0.1 K/uL   WBC Morphology INCREASED BANDS (>20% BANDS)     Comment: MILD LEFT SHIFT (1-5% METAS, OCC MYELO, OCC BANDS) TOXIC GRANULATION VACUOLATED NEUTROPHILS    RBC Morphology MORPHOLOGY UNREMARKABLE    Smear Review Normal platelet morphology    Immature Granulocytes 7 %   Abs Immature Granulocytes 1.18 (H) 0.00 - 0.07 K/uL    Comment: Performed at [facility], [address] [place], [place] [address]  hCG, serum, qualitative     Status: None   Collection Time: 08/27/18  3:05 AM  Result Value Ref Range   Preg, Serum NEGATIVE NEGATIVE    Comment:        THE SENSITIVITY OF THIS METHODOLOGY IS >10 mIU/mL. Performed at [facility], [address] [place], [place] [address]   Culture, blood (single) w Reflex to ID Panel     Status: None (Preliminary result)   Collection Time: 08/27/18  5:30 AM  Result Value Ref Range   Specimen Description      BLOOD RIGHT ANTECUBITAL Performed at [facility], [address] [place], [place] [address]    Special Requests      BOTTLES DRAWN AEROBIC AND ANAEROBIC Blood Culture adequate volume Performed at [facility], [address] [place], [place] [address]    Culture      NO GROWTH 1 DAY Performed at [facility], [place] [address] [place], [place] [address]    Report Status PENDING   Urinalysis, Routine w reflex microscopic     Status: Abnormal   Collection Time: 08/27/18  8:11 AM  Result Value Ref Range   Color, Urine AMBER (A) YELLOW    Comment: BIOCHEMICALS MAY BE AFFECTED BY COLOR   APPearance CLOUDY (A) CLEAR   Specific Gravity, Urine 1.023 1.005 - 1.030   pH 5.0 5.0 - 8.0   Glucose, UA NEGATIVE NEGATIVE mg/dL   Hgb urine dipstick LARGE (A) NEGATIVE   Bilirubin Urine NEGATIVE NEGATIVE   Ketones, ur NEGATIVE NEGATIVE mg/dL   Protein, ur 30 (A) NEGATIVE mg/dL   Nitrite NEGATIVE NEGATIVE   Leukocytes,Ua SMALL (A) NEGATIVE   RBC / HPF 0-5 0 - 5 RBC/hpf   WBC, UA 6-10 0 - 5 WBC/hpf   Bacteria, UA RARE (A) NONE SEEN   Squamous Epithelial / LPF 0-5 0 - 5    Comment: Performed at [facility], [address]  [place], [place] [address]  CBC     Status: Abnormal   Collection Time: 08/27/18  8:32 AM  Result Value Ref Range   WBC 15.9 (H) 4.0 - 10.5 K/uL   RBC 3.88 3.87 - 5.11 MIL/uL   Hemoglobin 12.0 12.0 - 15.0 g/dL   HCT 37.4 36.0 - 46.0 %   MCV 96.4 80.0 - 100.0 fL   MCH 30.9 26.0 - 34.0 pg   MCHC 32.1 30.0 - 36.0 g/dL   RDW 14.2 11.5 - 15.5 %   Platelets 142 (L) 150 - 400 K/uL   nRBC 0.0 0.0 - 0.2 %    Comment: Performed at [facility], [place] [address], [place], [place] [address]  Creatinine, serum     Status: Abnormal   Collection Time: 08/27/18  8:32 AM  Result Value Ref Range   Creatinine, Ser 1.91 (H) 0.44 - 1.00 mg/dL   GFR calc non Af Amer 31 (L) >60 mL/min   GFR calc Af Amer 36 (L) >60 mL/min    Comment: Performed at [facility],  [place] [address] [place], [place] [address]  Albumin     Status: None   Collection Time: 08/27/18  8:32 AM  Result Value Ref Range   Albumin 3.5 3.5 - 5.0 g/dL    Comment: Performed at [facility], [place] [address] [place], [place] [address]  Comprehensive metabolic panel     Status: Abnormal   Collection Time: 08/28/18  3:57 AM  Result Value Ref Range   Sodium 137 135 - 145 mmol/L   Potassium 4.2 3.5 - 5.1 mmol/L   Chloride 103 98 - 111 mmol/L   CO2 23 22 - 32 mmol/L   Glucose, Bld 114 (H) 70 - 99 mg/dL   BUN 37 (H) 6 - 20 mg/dL   Creatinine, Ser 2.40 (H) 0.44 - 1.00 mg/dL   Calcium 8.5 (L) 8.9 - 10.3 mg/dL   Total Protein 5.9 (L) 6.5 - 8.1 g/dL   Albumin 2.9 (L) 3.5 - 5.0 g/dL   AST 20 15 - 41 U/L   ALT 38 0 - 44 U/L   Alkaline Phosphatase 81 38 - 126 U/L   Total Bilirubin 0.9 0.3 - 1.2 mg/dL   GFR calc non Af Amer 23 (L) >60 mL/min   GFR calc Af Amer 27 (L) >60 mL/min   Anion gap 11 5 - 15    Comment: Performed at [facility], [place] [address] [place], [place] [address]  CBC     Status: Abnormal   Collection Time: 08/28/18  3:57 AM  Result Value Ref Range   WBC 14.8 (H) 4.0 - 10.5 K/uL   RBC 3.47 (L) 3.87 - 5.11 MIL/uL   Hemoglobin 10.5 (L) 12.0 - 15.0 g/dL   HCT 32.7 (L) 36.0 - 46.0 %   MCV 94.2 80.0 - 100.0 fL   MCH 30.3 26.0 - 34.0 pg   MCHC 32.1 30.0 - 36.0 g/dL   RDW 14.2 11.5 - 15.5 %   Platelets 113 (L) 150 - 400 K/uL    Comment: REPEATED TO VERIFY PLATELET COUNT CONFIRMED BY SMEAR SPECIMEN CHECKED FOR CLOTS Immature Platelet Fraction may be clinically indicated, consider ordering this additional test [id number]    nRBC 0.0 0.0 - 0.2 %    Comment: Performed at [facility], [place] [address], [place], [place] [address]   Ct Abdomen Pelvis W Contrast  Addendum Date: 08/27/2018   ADDENDUM REPORT:  08/27/2018 04:32 ADDENDUM: Add to IMPRESSION: Thickened urinary bladder wall, a finding felt to be indicative  of a degree of cystitis. Electronically Signed   By: Lowella Grip III M.D.   On: 08/27/2018 04:32   Result Date: 08/27/2018 CLINICAL DATA:  Abdominal pain, primarily left-sided EXAM: CT ABDOMEN AND PELVIS WITH CONTRAST TECHNIQUE: Multidetector CT imaging of the abdomen and pelvis was performed using the standard protocol following bolus administration of intravenous contrast. CONTRAST:  49mL ISOVUE-300 IOPAMIDOL (ISOVUE-300) INJECTION 61% COMPARISON:  None. FINDINGS: Lower chest: There is bibasilar atelectatic change. Hepatobiliary: No focal liver lesions are evident. Gallbladder appears contracted. No biliary duct dilatation evident. Pancreas: No pancreatic mass or inflammatory focus. Spleen: No splenic lesions are evident. Adrenals/Urinary Tract: Right adrenal appears normal. There is a mass arising from the left adrenal measuring 2.3 x 2.2 cm. The attenuation of this mass does not indicate that it represents an adenoma. There is a 4 mm probable cyst in the upper pole left kidney. There is no evident hydronephrosis on either side. There is no evident renal or ureteral calculus on either side. The urinary bladder wall is thickened. Stomach/Bowel: The rectum is borderline distended with stool. There is no bowel wall or mesenteric thickening. No diverticulitis evident. There is no bowel obstruction. There is no evident free air or portal venous air. There is lipomatous infiltration of the ileocecal valve. Vascular/Lymphatic: There is no abdominal aortic aneurysm. No vascular lesions are appreciable. There is no adenopathy in the abdomen or pelvis. Reproductive: The uterus is anteverted.  No pelvic mass is evident. Other: Appendix appears unremarkable. No abscess or ascites is evident in the peritoneum or retroperitoneum of the abdomen and pelvis. There is a small ventral hernia containing only fat. There is soft tissue stranding in the left inguinal region. There is ill-defined fluid in the subcutaneous region  of the left inguinal region this ill-defined collection measures 3.8 x 3.6 x 3.3 cm. Musculoskeletal: There is postoperative change in the lumbar spine at L4 and L5. There is degenerative change in the lower lumbar spine. There are no blastic or lytic bone lesions. No intramuscular lesions are evident. IMPRESSION: 1. There is inflammatory change with apparent developing phlegmon in the medial upper left thigh/left inguinal region. No air is seen in this area. 2. No evident diverticulitis. No bowel obstruction or bowel wall thickening. No abscess within the peritoneum or retroperitoneum of the abdomen and pelvis. Appendix appears normal. 3. There is a left adrenal mass measuring 2.3 x 2.2 cm. Etiology uncertain. Nonemergent adrenal MR to further evaluate this mass pre and post-contrast may well be advisable. 4.  No evident renal or ureteral calculus.  No hydronephrosis. 5.  Postoperative change at L4 and L5. Electronically Signed: By: Lowella Grip III M.D. On: 08/27/2018 04:25      Assessment/Plan Hx of PE/DVT HTN CKD stage II Morbid Obesity Chronic back pain   L groin cellulitis/abscess - aerobic culture sent - await results - WBC 14.8 from 15.9, Tmax 100.7 yesterday, afebrile today - plan bedside I&D today   Brigid Re, [facility] 08/28/2018, 1:25 PM Pager: [phone] Consults: [phone]

## 2018-08-28 NOTE — Progress Notes (Signed)
Pharmacy Antibiotic Note  Caitlin Taylor is a 48 y.o. female admitted on 08/27/2018 with cellulitis.  Pharmacy has been consulted for vancomycin dosing.  Pt is 48 year old female with PMH CKD, hx VTE, HTN - presenting left groin pain/cellulitis. Pharmacy consulted to dose vancomycin.  NKDA.  Today, 08/28/18  WBC 14.8 - elevated  Afebrile  SCr 2.40 trending up , CrCl ~ 37 mL/min. Baseline SCr appears to be ~1.7, so slightly elevated on admission, but has started to trend up   Plan:  Ceftriaxone 2 g IV daily per MD  Hold vancomycin dose this AM due to rising SCr  SCr and vancomycin level with AM labs to help guide further therapy decisions  Closely monitor renal function for any dose adjustments. Monitor culture data for ability to de-escalate if MRSA can be ruled out.   Check vancomycin levels at steady state if indicated  Height: 5\' 3"  (160 cm) Weight: 276 lb (125.2 kg) IBW/kg (Calculated) : 52.4  Temp (24hrs), Avg:98.8 F (37.1 C), Min:97.4 F (36.3 C), Max:100.7 F (38.2 C)  Recent Labs  Lab 08/27/18 0305 08/27/18 0832 08/28/18 0357  WBC 16.3* 15.9* 14.8*  CREATININE 1.97* 1.91* 2.40*    Estimated Creatinine Clearance: 37.3 mL/min (A) (by C-G formula based on SCr of 2.4 mg/dL (H)).    No Known Allergies  Antimicrobials this admission: vancomycin 2/16 >>  ceftriaxone 2/16 >>   Dose adjustments this admission:  Microbiology results: 2/16 BCx: Sent  Thank you for allowing pharmacy to be a part of this patient's care.   Royetta Asal, PharmD, BCPS Pager [phone] 08/28/2018 10:47 AM

## 2018-08-28 NOTE — Procedures (Signed)
Incision and Drainage Procedure Note  Pre-operative Diagnosis: left groin abscess  Post-operative Diagnosis: same  Indications: as above  Anesthesia: 2% lidocaine with epinephrine  Procedure Details  The procedure, risks and complications have been discussed in detail (including, but not limited to infection, bleeding) with the patient, and the patient has verbally consented to the procedure.  The skin was sterilely prepped in the usual fashion. After adequate local anesthesia, I&D with a #11 blade was performed on the left mons. Purulent drainage: present The patient was observed until stable.  Findings: Abscess cavity with tracking medially and laterally and small amount purulent drainage  EBL: <10 cc's  Drains: none  Condition: Tolerated procedure well and Stable   Complications: none.  Wound packed with 1/4" iodoform and covered with dry gauze and ABD pad. Dressing to be changed once daily.   Brigid Re , [facility] 08/28/2018, 2:47 PM Pager: [phone]

## 2018-08-28 NOTE — Plan of Care (Signed)
  Problem: Education: Goal: Knowledge of General Education information will improve Description Including pain rating scale, medication(s)/side effects and non-pharmacologic comfort measures Outcome: Progressing   Problem: Health Behavior/Discharge Planning: Goal: Ability to manage health-related needs will improve Outcome: Progressing   Problem: Clinical Measurements: Goal: Ability to maintain clinical measurements within normal limits will improve Outcome: Progressing Goal: Will remain free from infection Outcome: Progressing Goal: Diagnostic test results will improve Outcome: Progressing Goal: Respiratory complications will improve Outcome: Progressing Goal: Cardiovascular complication will be avoided Outcome: Progressing   Problem: Activity: Goal: Risk for activity intolerance will decrease Outcome: Progressing   Problem: Nutrition: Goal: Adequate nutrition will be maintained Outcome: Progressing   Problem: Coping: Goal: Level of anxiety will decrease Outcome: Progressing   Problem: Elimination: Goal: Will not experience complications related to bowel motility Outcome: Progressing Goal: Will not experience complications related to urinary retention Outcome: Progressing   Problem: Pain Managment: Goal: General experience of comfort will improve Outcome: Progressing   Problem: Safety: Goal: Ability to remain free from injury will improve Outcome: Progressing   Problem: Skin Integrity: Goal: Risk for impaired skin integrity will decrease Outcome: Progressing   Problem: Pain Goal: Understanding of pain management will improve Outcome: Progressing

## 2018-08-28 NOTE — Progress Notes (Signed)
PROGRESS NOTE    MIKIYA NEBERGALL   CZY:[id number]  DOB: 12-17-1970  DOA: 08/27/2018 PCP: [name], MD   Brief Narrative:  Caitlin Taylor is a 48 y.o. female with medical history significant of DVT and PE, hypertension, chronic kidney disease, obesity presenting with left groin pain and cellulitis.    Subjective: She feels the pain is getting better but continues to have a great deal of swelling in her left groin.     Assessment & Plan:   Principal Problem:   Cellulitis and abscess of left groin with sepsis - it appears that the area has begun to drain and is draining quite a lot at this point. Drainage is yellowish grey and foul smelling- will request a culture and will also request a surgery consult to ensure she does not have any residual pockets of pus - cont antibiotics  Active Problems:     AKI on CKD (chronic kidney disease), stage III - Cr is rising- ? Due to Vancomycin- I will d/c Vancomycin and change to Doxycycline    HTN -cont Metoprolol- Spironolactone and Torsemide on hold  Left adrenal mass - noted incidentally on CT- will need a f/u MRI   Time spent in minutes: 35 DVT prophylaxis: Lovenox Code Status: Full code Family Communication:  Disposition Plan: home when infection improved and renal function returns to baseline Consultants:   General surgery Procedures:   none Antimicrobials:  Anti-infectives (From admission, onward)   Start     Dose/Rate Route Frequency Ordered Stop   08/28/18 1100  vancomycin (VANCOCIN) IVPB 750 mg/150 ml premix  Status:  Discontinued     750 mg 150 mL/hr over 60 Minutes Intravenous Every 24 hours 08/27/18 1225 08/28/18 1039   08/28/18 1045  vancomycin variable dose per unstable renal function (pharmacist dosing)      Does not apply See admin instructions 08/28/18 1045     08/27/18 1800  cefTRIAXone (ROCEPHIN) 2 g in sodium chloride 0.9 % 100 mL IVPB     2 g 200 mL/hr over 30 Minutes Intravenous Every 24 hours  08/27/18 0759     08/27/18 0900  vancomycin (VANCOCIN) 2,000 mg in sodium chloride 0.9 % 500 mL IVPB     2,000 mg 250 mL/hr over 120 Minutes Intravenous  Once 08/27/18 0811 08/27/18 1111   08/27/18 0515  cefTRIAXone (ROCEPHIN) 1 g in sodium chloride 0.9 % 100 mL IVPB     1 g 200 mL/hr over 30 Minutes Intravenous  Once 08/27/18 0502 08/27/18 0603   08/27/18 0500  vancomycin (VANCOCIN) IVPB 1000 mg/200 mL premix  Status:  Discontinued     1,000 mg 200 mL/hr over 60 Minutes Intravenous  Once 08/27/18 0446 08/27/18 0810       Objective: Vitals:   08/27/18 1657 08/27/18 2114 08/27/18 2243 08/28/18 0523  BP:  (!) 121/99 (!) 89/57 104/67  Pulse: 90 95 95 86  Resp:  16 16 16   Temp:  98.2 F (36.8 C) 98.5 F (36.9 C) 98.3 F (36.8 C)  TempSrc:  Oral Oral Oral  SpO2:  94% 95% 94%  Weight:      Height:        Intake/Output Summary (Last 24 hours) at 08/28/2018 1317 Last data filed at 08/28/2018 0900 Gross per 24 hour  Intake 2119.36 ml  Output 0 ml  Net 2119.36 ml   Filed Weights   08/27/18 0243  Weight: 125.2 kg    Examination: General exam: Appears comfortable  HEENT: PERRLA,  oral mucosa moist, no sclera icterus or thrush Respiratory system: Clear to auscultation. Respiratory effort normal. Cardiovascular system: S1 & S2 heard, RRR.   Gastrointestinal system: Abdomen soft, Bowel sounds +, left groin is indurated,tender and has foul smelling drainag. Central nervous system: Alert and oriented. No focal neurological deficits. Extremities: No cyanosis, clubbing or edema Skin: No rashes or ulcers Psychiatry:  Mood & affect appropriate.     Data Reviewed: I have personally reviewed following labs and imaging studies  CBC: Recent Labs  Lab 08/27/18 0305 08/27/18 0832 08/28/18 0357  WBC 16.3* 15.9* 14.8*  NEUTROABS 12.6*  --   --   HGB 12.1 12.0 10.5*  HCT 37.8 37.4 32.7*  MCV 94.3 96.4 94.2  PLT 150 142* 277*   Basic Metabolic Panel: Recent Labs  Lab  08/27/18 0305 08/27/18 0832 08/28/18 0357  NA 135  --  137  K 3.8  --  4.2  CL 101  --  103  CO2 24  --  23  GLUCOSE 118*  --  114*  BUN 35*  --  37*  CREATININE 1.97* 1.91* 2.40*  CALCIUM 8.7*  --  8.5*   GFR: Estimated Creatinine Clearance: 37.3 mL/min (A) (by C-G formula based on SCr of 2.4 mg/dL (H)). Liver Function Tests: Recent Labs  Lab 08/27/18 0832 08/28/18 0357  AST  --  20  ALT  --  38  ALKPHOS  --  81  BILITOT  --  0.9  PROT  --  5.9*  ALBUMIN 3.5 2.9*   No results for input(s): LIPASE, AMYLASE in the last 168 hours. No results for input(s): AMMONIA in the last 168 hours. Coagulation Profile: No results for input(s): INR, PROTIME in the last 168 hours. Cardiac Enzymes: No results for input(s): CKTOTAL, CKMB, CKMBINDEX, TROPONINI in the last 168 hours. BNP (last 3 results) No results for input(s): PROBNP in the last 8760 hours. HbA1C: No results for input(s): HGBA1C in the last 72 hours. CBG: No results for input(s): GLUCAP in the last 168 hours. Lipid Profile: No results for input(s): CHOL, HDL, LDLCALC, TRIG, CHOLHDL, LDLDIRECT in the last 72 hours. Thyroid Function Tests: No results for input(s): TSH, T4TOTAL, FREET4, T3FREE, THYROIDAB in the last 72 hours. Anemia Panel: No results for input(s): VITAMINB12, FOLATE, FERRITIN, TIBC, IRON, RETICCTPCT in the last 72 hours. Urine analysis:    Component Value Date/Time   COLORURINE AMBER (A) 08/27/2018 0811   APPEARANCEUR CLOUDY (A) 08/27/2018 0811   LABSPEC 1.023 08/27/2018 0811   PHURINE 5.0 08/27/2018 0811   GLUCOSEU NEGATIVE 08/27/2018 0811   HGBUR LARGE (A) 08/27/2018 0811   BILIRUBINUR NEGATIVE 08/27/2018 0811   KETONESUR NEGATIVE 08/27/2018 0811   PROTEINUR 30 (A) 08/27/2018 0811   NITRITE NEGATIVE 08/27/2018 0811   LEUKOCYTESUR SMALL (A) 08/27/2018 0811   Sepsis Labs: @LABRCNTIP (procalcitonin:4,lacticidven:4) ) Recent Results (from the past 240 hour(s))  Culture, blood (single) w Reflex  to ID Panel     Status: None (Preliminary result)   Collection Time: 08/27/18  5:30 AM  Result Value Ref Range Status   Specimen Description   Final    BLOOD RIGHT ANTECUBITAL Performed at [facility], [address] [place], [place] [address]    Special Requests   Final    BOTTLES DRAWN AEROBIC AND ANAEROBIC Blood Culture adequate volume Performed at [facility], [address] [place], [place] [address]    Culture   Final    NO GROWTH 1 DAY  Performed at [facility], [place] [address] [place], [place] [address]    Report Status PENDING  Incomplete         Radiology Studies: Ct Abdomen Pelvis W Contrast  Addendum Date: 08/27/2018   ADDENDUM REPORT: 08/27/2018 04:32 ADDENDUM: Add to IMPRESSION: Thickened urinary bladder wall, a finding felt to be indicative of a degree of cystitis. Electronically Signed   By: Lowella Grip III M.D.   On: 08/27/2018 04:32   Result Date: 08/27/2018 CLINICAL DATA:  Abdominal pain, primarily left-sided EXAM: CT ABDOMEN AND PELVIS WITH CONTRAST TECHNIQUE: Multidetector CT imaging of the abdomen and pelvis was performed using the standard protocol following bolus administration of intravenous contrast. CONTRAST:  26mL ISOVUE-300 IOPAMIDOL (ISOVUE-300) INJECTION 61% COMPARISON:  None. FINDINGS: Lower chest: There is bibasilar atelectatic change. Hepatobiliary: No focal liver lesions are evident. Gallbladder appears contracted. No biliary duct dilatation evident. Pancreas: No pancreatic mass or inflammatory focus. Spleen: No splenic lesions are evident. Adrenals/Urinary Tract: Right adrenal appears normal. There is a mass arising from the left adrenal measuring 2.3 x 2.2 cm. The attenuation of this mass does not indicate that it represents an adenoma. There is a 4 mm probable cyst in the upper pole left kidney. There is no evident hydronephrosis on either side. There is no evident renal or ureteral calculus on either side. The  urinary bladder wall is thickened. Stomach/Bowel: The rectum is borderline distended with stool. There is no bowel wall or mesenteric thickening. No diverticulitis evident. There is no bowel obstruction. There is no evident free air or portal venous air. There is lipomatous infiltration of the ileocecal valve. Vascular/Lymphatic: There is no abdominal aortic aneurysm. No vascular lesions are appreciable. There is no adenopathy in the abdomen or pelvis. Reproductive: The uterus is anteverted.  No pelvic mass is evident. Other: Appendix appears unremarkable. No abscess or ascites is evident in the peritoneum or retroperitoneum of the abdomen and pelvis. There is a small ventral hernia containing only fat. There is soft tissue stranding in the left inguinal region. There is ill-defined fluid in the subcutaneous region of the left inguinal region this ill-defined collection measures 3.8 x 3.6 x 3.3 cm. Musculoskeletal: There is postoperative change in the lumbar spine at L4 and L5. There is degenerative change in the lower lumbar spine. There are no blastic or lytic bone lesions. No intramuscular lesions are evident. IMPRESSION: 1. There is inflammatory change with apparent developing phlegmon in the medial upper left thigh/left inguinal region. No air is seen in this area. 2. No evident diverticulitis. No bowel obstruction or bowel wall thickening. No abscess within the peritoneum or retroperitoneum of the abdomen and pelvis. Appendix appears normal. 3. There is a left adrenal mass measuring 2.3 x 2.2 cm. Etiology uncertain. Nonemergent adrenal MR to further evaluate this mass pre and post-contrast may well be advisable. 4.  No evident renal or ureteral calculus.  No hydronephrosis. 5.  Postoperative change at L4 and L5. Electronically Signed: By: Lowella Grip III M.D. On: 08/27/2018 04:25      Scheduled Meds: . allopurinol  100 mg Oral Daily  . colchicine  0.6 mg Oral BID  . enoxaparin (LOVENOX) injection   40 mg Subcutaneous Q24H  . metoprolol tartrate  12.5 mg Oral BID  . norethindrone  1 tablet Oral QHS  . polyethylene glycol  17 g Oral Daily  . vancomycin variable dose per unstable renal function (pharmacist dosing)   Does not apply See admin instructions  Continuous Infusions: . cefTRIAXone (ROCEPHIN)  IV Stopped (08/27/18 1800)     LOS: 1 day      Debbe Odea, MD [facility] Pager: [email or web address] Password [email or web address] 08/28/2018, 1:17 PM

## 2018-08-28 NOTE — Progress Notes (Signed)
bp 89/57, patient asymptomatic, was sleeping prior to vitals being taken and had also had a narcotic pain pill recently. Will continue to montior.

## 2018-08-29 LAB — BASIC METABOLIC PANEL
Anion gap: 11 (ref 5–15)
BUN: 35 mg/dL — ABNORMAL HIGH (ref 6–20)
CO2: 25 mmol/L (ref 22–32)
Calcium: 8.6 mg/dL — ABNORMAL LOW (ref 8.9–10.3)
Chloride: 100 mmol/L (ref 98–111)
Creatinine, Ser: 2.19 mg/dL — ABNORMAL HIGH (ref 0.44–1.00)
GFR, EST AFRICAN AMERICAN: 30 mL/min — AB (ref >60)
GFR, EST NON AFRICAN AMERICAN: 26 mL/min — AB (ref >60)
Glucose, Bld: 88 mg/dL (ref 70–99)
Potassium: 3.7 mmol/L (ref 3.5–5.1)
SODIUM: 136 mmol/L (ref 135–145)

## 2018-08-29 LAB — CBC
HCT: 33.3 % — ABNORMAL LOW (ref 36.0–46.0)
Hemoglobin: 10.8 g/dL — ABNORMAL LOW (ref 12.0–15.0)
MCH: 31.1 pg (ref 26.0–34.0)
MCHC: 32.4 g/dL (ref 30.0–36.0)
MCV: 96 fL (ref 80.0–100.0)
Platelets: 124 10*3/uL — ABNORMAL LOW (ref 150–400)
RBC: 3.47 MIL/uL — ABNORMAL LOW (ref 3.87–5.11)
RDW: 14.3 % (ref 11.5–15.5)
WBC: 11.6 10*3/uL — AB (ref 4.0–10.5)
nRBC: 0 % (ref 0.0–0.2)

## 2018-08-29 LAB — VANCOMYCIN, RANDOM: Vancomycin Rm: 9

## 2018-08-29 MED ORDER — SODIUM CHLORIDE 0.9 % IV SOLN
INTRAVENOUS | Status: DC | PRN
  Administered 2018-08-29 – 2018-09-10 (×3): 250 mL via INTRAVENOUS

## 2018-08-29 NOTE — Progress Notes (Signed)
    CC:  Left groin abscess  Subjective: Dressing was just change and looks fine.  She still has a lot of cellulitis.  She did get in the shower with the wound open early this AM.    Objective: Vital signs in last 24 hours: Temp:  [97.6 F (36.4 C)-99.5 F (37.5 C)] 99.5 F (37.5 C) (02/18 0552) Pulse Rate:  [89-97] 92 (02/18 0552) Resp:  [16] 16 (02/17 2135) BP: (114-135)/(67-86) 125/80 (02/18 0552) SpO2:  [91 %-98 %] 91 % (02/18 0552) Weight:  [130.2 kg-134.1 kg] 130.2 kg (02/18 0500) Last BM Date: 08/27/18 1310 PO 100 IV Urine x 2 emesis  450 noted yesterday Afebrile, VSS Creatinine 2.19  Intake/Output from previous day: 02/17 0701 - 02/18 0700 In: 1410 [P.O.:1310; IV Piggyback:100] Out: 450 [Emesis/NG output:450] Intake/Output this shift: No intake/output data recorded.  General appearance: alert, cooperative and no distress Skin: Skin color, texture, turgor normal. No rashes or lesions or she still has a lot of cellulitis around the site and a blister lateral left thigh.    Lab Results:  Recent Labs    08/28/18 0357 08/29/18 0343  WBC 14.8* 11.6*  HGB 10.5* 10.8*  HCT 32.7* 33.3*  PLT 113* 124*    BMET Recent Labs    08/28/18 0357 08/29/18 0343  NA 137 136  K 4.2 3.7  CL 103 100  CO2 23 25  GLUCOSE 114* 88  BUN 37* 35*  CREATININE 2.40* 2.19*  CALCIUM 8.5* 8.6*   PT/INR No results for input(s): LABPROT, INR in the last 72 hours.  Recent Labs  Lab 08/27/18 0832 08/28/18 0357  AST  --  20  ALT  --  38  ALKPHOS  --  81  BILITOT  --  0.9  PROT  --  5.9*  ALBUMIN 3.5 2.9*     Lipase  No results found for: LIPASE   Medications: . allopurinol  100 mg Oral Daily  . colchicine  0.6 mg Oral BID  . enoxaparin (LOVENOX) injection  40 mg Subcutaneous Q24H  . metoprolol tartrate  12.5 mg Oral BID  . norethindrone  1 tablet Oral QHS  . polyethylene glycol  17 g Oral Daily   . cefTRIAXone (ROCEPHIN)  IV Stopped (08/28/18 2125)   Gram  stain wound culture:   Gram Stain FEW WBC PRESENT,BOTH PMN AND MONONUCLEAR  FEW GRAM POSITIVE COCCI  RARE GRAM VARIABLE ROD     Assessment/Plan Hypertension Acute kidney injury1.97>>2.40>>2.19 today Left adrenal mass Morbid obesity - BMI 50.8 WBC 16.3>>14.8>>11.6   Left groin abscess/cellulitis I&D left groin abscess, 08/28/18  FEN:  Heart healthy/carb mod CB:JSEGBTDV 2/16 >> day 3 DVT:  Lovenox Follow up:  [facility]  Plan:  Continue IV ABx, and await culture, ongoing TID dressing changes.      LOS: 2 days    Blanchie Zeleznik 08/29/2018 [phone]

## 2018-08-29 NOTE — Progress Notes (Signed)
PROGRESS NOTE    Caitlin Taylor   YJE:[id number]  DOB: [birth year]  DOA: 08/27/2018 PCP: [name], MD   Brief Narrative:  Caitlin Taylor is a 48 y.o. female with medical history significant of DVT and PE, hypertension, chronic kidney disease, obesity presenting with left groin pain and cellulitis.    Subjective: Pain in swelling are improving but have not resolved.     Assessment & Plan:   Principal Problem:   Cellulitis and abscess of left groin with sepsis - it appears that the area has begun to drain- I and D done at bedside yesterday by General surgery - she is still quite indurated in her mons pubis and lateral thigh - f/u on wound culture-cont Ceftriaxone and Doxycycline  Active Problems:     AKI on CKD (chronic kidney disease), stage III - Cr is rising- ? Due to Vancomycin- I d/c'd Vancomycin on 2/17 and changed to Doxycycline - Cr is improved today   HTN -cont Metoprolol- Spironolactone and Torsemide on hold  Left adrenal mass - noted incidentally on CT- will need a f/u MRI   Time spent in minutes: 35 DVT prophylaxis: Lovenox Code Status: Full code Family Communication:  Disposition Plan: home when infection improved and renal function returns to baseline Consultants:   General surgery Procedures:   none Antimicrobials:  Anti-infectives (From admission, onward)   Start     Dose/Rate Route Frequency Ordered Stop   08/28/18 1100  vancomycin (VANCOCIN) IVPB 750 mg/150 ml premix  Status:  Discontinued     750 mg 150 mL/hr over 60 Minutes Intravenous Every 24 hours 08/27/18 1225 08/28/18 1039   08/28/18 1045  vancomycin variable dose per unstable renal function (pharmacist dosing)  Status:  Discontinued      Does not apply See admin instructions 08/28/18 1045 08/28/18 1331   08/27/18 1800  cefTRIAXone (ROCEPHIN) 2 g in sodium chloride 0.9 % 100 mL IVPB     2 g 200 mL/hr over 30 Minutes Intravenous Every 24 hours 08/27/18 0759     08/27/18 0900   vancomycin (VANCOCIN) 2,000 mg in sodium chloride 0.9 % 500 mL IVPB     2,000 mg 250 mL/hr over 120 Minutes Intravenous  Once 08/27/18 0811 08/27/18 1111   08/27/18 0515  cefTRIAXone (ROCEPHIN) 1 g in sodium chloride 0.9 % 100 mL IVPB     1 g 200 mL/hr over 30 Minutes Intravenous  Once 08/27/18 0502 08/27/18 0603   08/27/18 0500  vancomycin (VANCOCIN) IVPB 1000 mg/200 mL premix  Status:  Discontinued     1,000 mg 200 mL/hr over 60 Minutes Intravenous  Once 08/27/18 0446 08/27/18 0810       Objective: Vitals:   08/28/18 1528 08/28/18 2135 08/29/18 0500 08/29/18 0552  BP: 135/86 114/67  125/80  Pulse: 89 97  92  Resp: 16 16    Temp: 97.9 F (36.6 C) 98.3 F (36.8 C)  99.5 F (37.5 C)  TempSrc: Oral Oral  Oral  SpO2: 95% 95%  91%  Weight: 134.1 kg  130.2 kg   Height: 5\' 3"  (1.6 m)       Intake/Output Summary (Last 24 hours) at 08/29/2018 1215 Last data filed at 08/29/2018 0600 Gross per 24 hour  Intake 1170 ml  Output 450 ml  Net 720 ml   Filed Weights   08/27/18 0243 08/28/18 1528 08/29/18 0500  Weight: 125.2 kg 134.1 kg 130.2 kg    Examination: General exam: Appears comfortable  HEENT: PERRLA, oral  mucosa moist, no sclera icterus or thrush Respiratory system: Clear to auscultation. Respiratory effort normal. Cardiovascular system: S1 & S2 heard,  No murmurs  Gastrointestinal system: Abdomen soft, nondistended. Normal bowel sounds. Still has induration/ edema, tenderness in left groin with drainage from wound Central nervous system: Alert and oriented. No focal neurological deficits. Extremities: No cyanosis, clubbing or edema Skin: No rashes or ulcers Psychiatry:  Mood & affect appropriate.     Data Reviewed: I have personally reviewed following labs and imaging studies  CBC: Recent Labs  Lab 08/27/18 0305 08/27/18 0832 08/28/18 0357 08/29/18 0343  WBC 16.3* 15.9* 14.8* 11.6*  NEUTROABS 12.6*  --   --   --   HGB 12.1 12.0 10.5* 10.8*  HCT 37.8 37.4 32.7*  33.3*  MCV 94.3 96.4 94.2 96.0  PLT 150 142* 113* 812*   Basic Metabolic Panel: Recent Labs  Lab 08/27/18 0305 08/27/18 0832 08/28/18 0357 08/29/18 0343  NA 135  --  137 136  K 3.8  --  4.2 3.7  CL 101  --  103 100  CO2 24  --  23 25  GLUCOSE 118*  --  114* 88  BUN 35*  --  37* 35*  CREATININE 1.97* 1.91* 2.40* 2.19*  CALCIUM 8.7*  --  8.5* 8.6*   GFR: Estimated Creatinine Clearance: 41.9 mL/min (A) (by C-G formula based on SCr of 2.19 mg/dL (H)). Liver Function Tests: Recent Labs  Lab 08/27/18 0832 08/28/18 0357  AST  --  20  ALT  --  38  ALKPHOS  --  81  BILITOT  --  0.9  PROT  --  5.9*  ALBUMIN 3.5 2.9*   No results for input(s): LIPASE, AMYLASE in the last 168 hours. No results for input(s): AMMONIA in the last 168 hours. Coagulation Profile: No results for input(s): INR, PROTIME in the last 168 hours. Cardiac Enzymes: No results for input(s): CKTOTAL, CKMB, CKMBINDEX, TROPONINI in the last 168 hours. BNP (last 3 results) No results for input(s): PROBNP in the last 8760 hours. HbA1C: No results for input(s): HGBA1C in the last 72 hours. CBG: No results for input(s): GLUCAP in the last 168 hours. Lipid Profile: No results for input(s): CHOL, HDL, LDLCALC, TRIG, CHOLHDL, LDLDIRECT in the last 72 hours. Thyroid Function Tests: No results for input(s): TSH, T4TOTAL, FREET4, T3FREE, THYROIDAB in the last 72 hours. Anemia Panel: No results for input(s): VITAMINB12, FOLATE, FERRITIN, TIBC, IRON, RETICCTPCT in the last 72 hours. Urine analysis:    Component Value Date/Time   COLORURINE AMBER (A) 08/27/2018 0811   APPEARANCEUR CLOUDY (A) 08/27/2018 0811   LABSPEC 1.023 08/27/2018 0811   PHURINE 5.0 08/27/2018 0811   GLUCOSEU NEGATIVE 08/27/2018 0811   HGBUR LARGE (A) 08/27/2018 0811   BILIRUBINUR NEGATIVE 08/27/2018 0811   KETONESUR NEGATIVE 08/27/2018 0811   PROTEINUR 30 (A) 08/27/2018 0811   NITRITE NEGATIVE 08/27/2018 0811   LEUKOCYTESUR SMALL (A)  08/27/2018 0811   Sepsis Labs: @LABRCNTIP (procalcitonin:4,lacticidven:4) ) Recent Results (from the past 240 hour(s))  Culture, blood (single) w Reflex to ID Panel     Status: None (Preliminary result)   Collection Time: 08/27/18  5:30 AM  Result Value Ref Range Status   Specimen Description   Final    BLOOD RIGHT ANTECUBITAL Performed at [facility], [address] [place], [place] [address]    Special Requests   Final    BOTTLES DRAWN AEROBIC AND ANAEROBIC Blood Culture adequate volume Performed at [facility]  [address], [address], [place], [place] [address]    Culture   Final    NO GROWTH 2 DAYS Performed at [facility], [place] [address], [place], [place] [address]    Report Status PENDING  Incomplete  Aerobic Culture (superficial specimen)     Status: None (Preliminary result)   Collection Time: 08/28/18  1:23 PM  Result Value Ref Range Status   Specimen Description   Final    GROIN LEFT Performed at [facility], [place] [address] [place], [place] [address]    Special Requests   Final    NONE Performed at [facility], [place] [address] [place], [place] [address]    Gram Stain   Final    FEW WBC PRESENT,BOTH PMN AND MONONUCLEAR FEW GRAM POSITIVE COCCI RARE GRAM VARIABLE ROD Performed at [facility], [place] [address], [place], [place] [address]    Culture PENDING  Incomplete   Report Status PENDING  Incomplete         Radiology Studies: No results found.    Scheduled Meds: . allopurinol  100 mg Oral Daily  . colchicine  0.6 mg Oral BID  . enoxaparin (LOVENOX) injection  40 mg Subcutaneous Q24H  . metoprolol tartrate  12.5 mg Oral BID  . norethindrone  1 tablet Oral QHS  . polyethylene glycol  17 g Oral Daily   Continuous Infusions: . cefTRIAXone (ROCEPHIN)  IV Stopped (08/28/18 2125)     LOS: 2 days      Debbe Odea, MD [facility] Pager: [email or web address] Password [email or web address] 08/29/2018,  12:15 PM

## 2018-08-30 DIAGNOSIS — N183 Chronic kidney disease, stage 3 (moderate): Secondary | ICD-10-CM

## 2018-08-30 DIAGNOSIS — N179 Acute kidney failure, unspecified: Secondary | ICD-10-CM | POA: Diagnosis present

## 2018-08-30 LAB — BASIC METABOLIC PANEL
Anion gap: 11 (ref 5–15)
BUN: 26 mg/dL — ABNORMAL HIGH (ref 6–20)
CHLORIDE: 103 mmol/L (ref 98–111)
CO2: 24 mmol/L (ref 22–32)
Calcium: 8.6 mg/dL — ABNORMAL LOW (ref 8.9–10.3)
Creatinine, Ser: 1.86 mg/dL — ABNORMAL HIGH (ref 0.44–1.00)
GFR calc Af Amer: 37 mL/min — ABNORMAL LOW (ref >60)
GFR calc non Af Amer: 32 mL/min — ABNORMAL LOW (ref >60)
Glucose, Bld: 124 mg/dL — ABNORMAL HIGH (ref 70–99)
POTASSIUM: 4.2 mmol/L (ref 3.5–5.1)
Sodium: 138 mmol/L (ref 135–145)

## 2018-08-30 LAB — AEROBIC CULTURE W GRAM STAIN (SUPERFICIAL SPECIMEN)

## 2018-08-30 MED ORDER — SODIUM CHLORIDE 0.9 % IV SOLN
INTRAVENOUS | Status: DC
  Administered 2018-08-30 – 2018-08-31 (×2): via INTRAVENOUS

## 2018-08-30 MED ORDER — PIPERACILLIN-TAZOBACTAM 3.375 G IVPB
3.3750 g | Freq: Three times a day (TID) | INTRAVENOUS | Status: DC
  Administered 2018-08-30 – 2018-09-05 (×19): 3.375 g via INTRAVENOUS
  Filled 2018-08-30 (×21): qty 50

## 2018-08-30 MED ORDER — INFLUENZA VAC SPLIT QUAD 0.5 ML IM SUSY
0.5000 mL | PREFILLED_SYRINGE | INTRAMUSCULAR | Status: AC
  Administered 2018-09-08: 0.5 mL via INTRAMUSCULAR
  Filled 2018-08-30: qty 0.5

## 2018-08-30 MED ORDER — SODIUM CHLORIDE 0.9 % IV SOLN
100.0000 mg | Freq: Two times a day (BID) | INTRAVENOUS | Status: DC
  Administered 2018-08-30 – 2018-09-01 (×4): 100 mg via INTRAVENOUS
  Filled 2018-08-30 (×5): qty 100

## 2018-08-30 NOTE — Progress Notes (Signed)
CC: Groin abscess  Subjective: Took the dressing down she still has a lot of cellulitis persisting.  The wound was partially packed with iodoform.  It remains extremely purulent.  The cellulitis in her thigh and vulvar remain.  She is taking a fair amount of oxycodone for pain relief and dressing changes.  Objective: Vital signs in last 24 hours: Temp:  [98.2 F (36.8 C)-99.3 F (37.4 C)] 99.3 F (37.4 C) (02/19 0426) Pulse Rate:  [89-106] 97 (02/19 0426) Resp:  [17-20] 20 (02/19 0426) BP: (102-126)/(59-74) 109/74 (02/19 0426) SpO2:  [96 %-97 %] 97 % (02/19 0426) Last BM Date: 08/28/18 1060 p.o. 164 IV Urine x4 Afebrile vital signs are stable Creatinine improving down to 1.86 Intake/Output from previous day: 02/18 0701 - 02/19 0700 In: 1224 [P.O.:1060; I.V.:64; IV Piggyback:100] Out: -  Intake/Output this shift: No intake/output data recorded.  General appearance: alert, cooperative and no distress Resp: clear to auscultation bilaterally Skin: Open wound is clean but extremely purulent some necrosis at the base.  Cellulitis remains and does not show improvement at this point.  Lab Results:  Recent Labs    08/28/18 0357 08/29/18 0343  WBC 14.8* 11.6*  HGB 10.5* 10.8*  HCT 32.7* 33.3*  PLT 113* 124*    BMET Recent Labs    08/29/18 0343 08/30/18 0351  NA 136 138  K 3.7 4.2  CL 100 103  CO2 25 24  GLUCOSE 88 124*  BUN 35* 26*  CREATININE 2.19* 1.86*  CALCIUM 8.6* 8.6*   PT/INR No results for input(s): LABPROT, INR in the last 72 hours.  Recent Labs  Lab 08/27/18 0832 08/28/18 0357  AST  --  20  ALT  --  38  ALKPHOS  --  81  BILITOT  --  0.9  PROT  --  5.9*  ALBUMIN 3.5 2.9*     Lipase  No results found for: LIPASE   Medications: . allopurinol  100 mg Oral Daily  . colchicine  0.6 mg Oral BID  . enoxaparin (LOVENOX) injection  40 mg Subcutaneous Q24H  . metoprolol tartrate  12.5 mg Oral BID  . norethindrone  1 tablet Oral QHS  .  polyethylene glycol  17 g Oral Daily   Anti-infectives (From admission, onward)   Start     Dose/Rate Route Frequency Ordered Stop   08/28/18 1100  vancomycin (VANCOCIN) IVPB 750 mg/150 ml premix  Status:  Discontinued     750 mg 150 mL/hr over 60 Minutes Intravenous Every 24 hours 08/27/18 1225 08/28/18 1039   08/28/18 1045  vancomycin variable dose per unstable renal function (pharmacist dosing)  Status:  Discontinued      Does not apply See admin instructions 08/28/18 1045 08/28/18 1331   08/27/18 1800  cefTRIAXone (ROCEPHIN) 2 g in sodium chloride 0.9 % 100 mL IVPB     2 g 200 mL/hr over 30 Minutes Intravenous Every 24 hours 08/27/18 0759     08/27/18 0900  vancomycin (VANCOCIN) 2,000 mg in sodium chloride 0.9 % 500 mL IVPB     2,000 mg 250 mL/hr over 120 Minutes Intravenous  Once 08/27/18 0811 08/27/18 1111   08/27/18 0515  cefTRIAXone (ROCEPHIN) 1 g in sodium chloride 0.9 % 100 mL IVPB     1 g 200 mL/hr over 30 Minutes Intravenous  Once 08/27/18 0502 08/27/18 0603   08/27/18 0500  vancomycin (VANCOCIN) IVPB 1000 mg/200 mL premix  Status:  Discontinued     1,000 mg 200  mL/hr over 60 Minutes Intravenous  Once 08/27/18 0446 08/27/18 0810     Culture FEW ESCHERICHIA COLI  SUSCEPTIBILITIES TO FOLLOW     Assessment/Plan  Hypertension Acute kidney injury1.97>>2.40>>2.19 today Left adrenal mass Morbid obesity - BMI 50.8 WBC 16.3>>14.8>>11.6   Left groin abscess/cellulitis I&D left groin abscess, 08/28/18  FEN:  Heart healthy/carb mod ID: Vancomycin 2/16-17/20, Rocephin 2/16 >> day 4 DVT:  Lovenox Follow up:  [facility]  Plan: Continue local wound care.  Going to switch her over to Zosyn, and see if this helps and covers the organism better.  Cultures are still pending.  LOS: 3 days    Lyncoln Maskell 08/30/2018 [phone]

## 2018-08-30 NOTE — Progress Notes (Signed)
Pharmacy Antibiotic Note  Caitlin Taylor is a 48 y.o. female admitted on 08/27/2018 with cellulitis.  Pharmacy has been consulted for zosyn dosing.  Plan: Zosyn 3.375g IV Q8H infused over 4hrs. Pharmacy will sign off and follow peripherally for renal adjustment  Height: 5\' 3"  (160 cm) Weight: 287 lb (130.2 kg) IBW/kg (Calculated) : 52.4  Temp (24hrs), Avg:98.9 F (37.2 C), Min:98.2 F (36.8 C), Max:99.3 F (37.4 C)  Recent Labs  Lab 08/27/18 0305 08/27/18 0832 08/28/18 0357 08/29/18 0343 08/30/18 0351  WBC 16.3* 15.9* 14.8* 11.6*  --   CREATININE 1.97* 1.91* 2.40* 2.19* 1.86*  VANCORANDOM  --   --   --  9  --     Estimated Creatinine Clearance: 49.3 mL/min (A) (by C-G formula based on SCr of 1.86 mg/dL (H)).    No Known Allergies  Antimicrobials this admission: 2/16 ctx >>2/19 2/19 ZEI >> Dose adjustments this admission:   Microbiology results: 2/16 BCx: ngtd 2/17 groin: few EColi  Thank you for allowing pharmacy to be a part of this patient's care.  Dolly Rias RPh 08/30/2018, 10:01 AM Pager [phone]

## 2018-08-30 NOTE — Progress Notes (Signed)
PROGRESS NOTE    Caitlin Taylor  KPT:[id number] DOB: Oct 04, 1970 DOA: 08/27/2018 PCP: [name], MD    Brief Narrative:  Caitlin Taylor is a 48 y.o.femalewith medical history significant ofDVT and PE, hypertension, chronic kidney disease, obesity presenting with left groin pain and cellulitis.     Assessment & Plan:   Principal Problem:   Cellulitis of left groin Active Problems:   CKD (chronic kidney disease), stage III (HCC)   History of pulmonary embolus (PE)   Cellulitis   Abscess   Acute renal failure superimposed on stage 3 chronic kidney disease ([place])  1 left groin abscess/cellulitis Patient with no significant improvement with left groin abscess and cellulitis still with significant pain, mons pubis is swollen, left upper thigh with ongoing cellulitis and pain to palpation.  Patient status post bedside I&D left groin abscess 08/28/2018 with cultures growing E. coli.  Abscess site with some purulent drainage.  IV antibiotics have been changed to IV Zosyn per general surgery.  General surgery planning to wash the left groin incision in the OR tomorrow and I and D of the left lateral thigh as well.  General surgery following and appreciate input and recommendations.  2.  Acute kidney injury on chronic kidney disease stage III Questionable etiology.  Creatinine was rising and as such IV vancomycin discontinued.  Creatinine went up as high as 2.40.  Creatinine trending down and currently at 1.86.  Blood pressure is borderline.  Place on IV fluids and monitor for volume overload.  3.  Hypertension Blood pressure borderline.  Spironolactone and torsemide on hold.  Continue metoprolol.  4.  Left adrenal mass Incidentally noted on CT.  Will need outpatient follow-up MRI.   DVT prophylaxis: Lovenox Code Status: Full Family Communication: Updated patient.  No family at bedside. Disposition Plan: Home when clinically improved and medically stable and per general  surgery.   Consultants:   General surgery: Dr. Ninfa Linden 08/28/2018  Procedures:   CT abdomen and pelvis 08/27/2018  Bedside I&D 08/28/2018 per general surgery--Kelly Rayburn, PA general surgery  Antimicrobials:   IV Zosyn  08/30/2018  IV Rocephin 08/27/2018>>> 08/30/2018  IV vancomycin 08/27/2018>>>> 08/28/2018   Subjective: Patient laying in bed.  Still with complaints in the groin region.  No chest pain.  No shortness of breath.  Objective: Vitals:   08/29/18 1426 08/29/18 2118 08/30/18 0426 08/30/18 1429  BP: (!) 102/59 126/70 109/74 (!) 103/57  Pulse: 89 (!) 106 97 83  Resp: 17 20 20 16   Temp: 98.2 F (36.8 C) 99.2 F (37.3 C) 99.3 F (37.4 C) 98.9 F (37.2 C)  TempSrc: Oral Oral Oral Oral  SpO2: 96% 96% 97% 99%  Weight:      Height:        Intake/Output Summary (Last 24 hours) at 08/30/2018 1956 Last data filed at 08/30/2018 1902 Gross per 24 hour  Intake 813.99 ml  Output -  Net 813.99 ml   Filed Weights   08/27/18 0243 08/28/18 1528 08/29/18 0500  Weight: 125.2 kg 134.1 kg 130.2 kg    Examination:  General exam: Appears calm and comfortable  Respiratory system: Clear to auscultation. Respiratory effort normal. Cardiovascular system: S1 & S2 heard, RRR. No JVD, murmurs, rubs, gallops or clicks. No pedal edema. Gastrointestinal system: Abdomen is nondistended, soft and nontender. No organomegaly or masses felt. Normal bowel sounds heard. Central nervous system: Alert and oriented. No focal neurological deficits. Extremities: Symmetric 5 x 5 power. Genitourinary: Mons pubis swollen, tender to palpation,  left groin with some purulent drainage and left upper thigh with erythema, swelling, significant tenderness to palpation. Skin: No rashes, lesions or ulcers Psychiatry: Judgement and insight appear normal. Mood & affect appropriate.     Data Reviewed: I have personally reviewed following labs and imaging studies  CBC: Recent Labs  Lab 08/27/18 0305  08/27/18 0832 08/28/18 0357 08/29/18 0343  WBC 16.3* 15.9* 14.8* 11.6*  NEUTROABS 12.6*  --   --   --   HGB 12.1 12.0 10.5* 10.8*  HCT 37.8 37.4 32.7* 33.3*  MCV 94.3 96.4 94.2 96.0  PLT 150 142* 113* 478*   Basic Metabolic Panel: Recent Labs  Lab 08/27/18 0305 08/27/18 0832 08/28/18 0357 08/29/18 0343 08/30/18 0351  NA 135  --  137 136 138  K 3.8  --  4.2 3.7 4.2  CL 101  --  103 100 103  CO2 24  --  23 25 24   GLUCOSE 118*  --  114* 88 124*  BUN 35*  --  37* 35* 26*  CREATININE 1.97* 1.91* 2.40* 2.19* 1.86*  CALCIUM 8.7*  --  8.5* 8.6* 8.6*   GFR: Estimated Creatinine Clearance: 49.3 mL/min (A) (by C-G formula based on SCr of 1.86 mg/dL (H)). Liver Function Tests: Recent Labs  Lab 08/27/18 0832 08/28/18 0357  AST  --  20  ALT  --  38  ALKPHOS  --  81  BILITOT  --  0.9  PROT  --  5.9*  ALBUMIN 3.5 2.9*   No results for input(s): LIPASE, AMYLASE in the last 168 hours. No results for input(s): AMMONIA in the last 168 hours. Coagulation Profile: No results for input(s): INR, PROTIME in the last 168 hours. Cardiac Enzymes: No results for input(s): CKTOTAL, CKMB, CKMBINDEX, TROPONINI in the last 168 hours. BNP (last 3 results) No results for input(s): PROBNP in the last 8760 hours. HbA1C: No results for input(s): HGBA1C in the last 72 hours. CBG: No results for input(s): GLUCAP in the last 168 hours. Lipid Profile: No results for input(s): CHOL, HDL, LDLCALC, TRIG, CHOLHDL, LDLDIRECT in the last 72 hours. Thyroid Function Tests: No results for input(s): TSH, T4TOTAL, FREET4, T3FREE, THYROIDAB in the last 72 hours. Anemia Panel: No results for input(s): VITAMINB12, FOLATE, FERRITIN, TIBC, IRON, RETICCTPCT in the last 72 hours. Sepsis Labs: No results for input(s): PROCALCITON, LATICACIDVEN in the last 168 hours.  Recent Results (from the past 240 hour(s))  Culture, blood (single) w Reflex to ID Panel     Status: None (Preliminary result)   Collection Time:  08/27/18  5:30 AM  Result Value Ref Range Status   Specimen Description   Final    BLOOD RIGHT ANTECUBITAL Performed at [facility], [address] [place], [place] [address]    Special Requests   Final    BOTTLES DRAWN AEROBIC AND ANAEROBIC Blood Culture adequate volume Performed at [facility], [address] [place], [place] [address]    Culture   Final    NO GROWTH 3 DAYS Performed at [facility], [place] [address] [place], [place] [address]    Report Status PENDING  Incomplete  Aerobic Culture (superficial specimen)     Status: None   Collection Time: 08/28/18  1:23 PM  Result Value Ref Range Status   Specimen Description   Final    GROIN LEFT Performed at [facility], [place] [address], [place], [place] [address]    Special Requests  Final    NONE Performed at [facility], [place] [address], [place], [place] [address]    Gram Stain   Final    FEW WBC PRESENT,BOTH PMN AND MONONUCLEAR FEW GRAM POSITIVE COCCI RARE GRAM VARIABLE ROD Performed at [facility], [place] [address] [place],  [address]    Culture FEW ESCHERICHIA COLI FEW STREPTOCOCCUS GROUP G   Final   Report Status 08/30/2018 FINAL  Final   Organism ID, Bacteria ESCHERICHIA COLI  Final      Susceptibility   Escherichia coli - MIC*    AMPICILLIN >=32 RESISTANT Resistant     CEFAZOLIN 8 SENSITIVE Sensitive     CEFEPIME <=1 SENSITIVE Sensitive     CEFTAZIDIME <=1 SENSITIVE Sensitive     CEFTRIAXONE <=1 SENSITIVE Sensitive     CIPROFLOXACIN <=0.25 SENSITIVE Sensitive     GENTAMICIN <=1 SENSITIVE Sensitive     IMIPENEM <=0.25 SENSITIVE Sensitive     TRIMETH/SULFA <=20 SENSITIVE Sensitive     AMPICILLIN/SULBACTAM >=32 RESISTANT Resistant     PIP/TAZO <=4 SENSITIVE Sensitive     Extended ESBL NEGATIVE Sensitive     * FEW ESCHERICHIA COLI         Radiology Studies: No results found.      Scheduled Meds: . allopurinol  100 mg Oral  Daily  . colchicine  0.6 mg Oral BID  . enoxaparin (LOVENOX) injection  40 mg Subcutaneous Q24H  . [START ON 08/31/2018] Influenza vac split quadrivalent PF  0.5 mL Intramuscular Tomorrow-1000  . metoprolol tartrate  12.5 mg Oral BID  . norethindrone  1 tablet Oral QHS  . polyethylene glycol  17 g Oral Daily   Continuous Infusions: . sodium chloride 250 mL (08/30/18 1237)  . sodium chloride    . doxycycline (VIBRAMYCIN) IV    . piperacillin-tazobactam (ZOSYN)  IV 3.375 g (08/30/18 1857)     LOS: 3 days    Time spent: 35 minutes    Irine Seal, MD [facility]  If 7PM-7AM, please contact night-coverage [email or web address] 08/30/2018, 7:56 PM

## 2018-08-31 ENCOUNTER — Inpatient Hospital Stay (HOSPITAL_COMMUNITY): Payer: 59 | Admitting: Certified Registered"

## 2018-08-31 ENCOUNTER — Encounter (HOSPITAL_COMMUNITY)
Admission: EM | Disposition: A | Discharge status: Home Health | Payer: Self-pay | Source: Home / Self Care | Attending: Internal Medicine

## 2018-08-31 ENCOUNTER — Encounter (HOSPITAL_COMMUNITY): Payer: Self-pay

## 2018-08-31 HISTORY — PX: IRRIGATION AND DEBRIDEMENT ABSCESS: SHX5252

## 2018-08-31 LAB — CBC WITH DIFFERENTIAL/PLATELET
Abs Immature Granulocytes: 2.02 10*3/uL — ABNORMAL HIGH (ref 0.00–0.07)
BASOS ABS: 0.1 10*3/uL (ref 0.0–0.1)
Basophils Relative: 1 %
EOS PCT: 0 %
Eosinophils Absolute: 0 10*3/uL (ref 0.0–0.5)
HCT: 32.6 % — ABNORMAL LOW (ref 36.0–46.0)
Hemoglobin: 10.4 g/dL — ABNORMAL LOW (ref 12.0–15.0)
Immature Granulocytes: 15 %
Lymphocytes Relative: 5 %
Lymphs Abs: 0.7 10*3/uL (ref 0.7–4.0)
MCH: 30.7 pg (ref 26.0–34.0)
MCHC: 31.9 g/dL (ref 30.0–36.0)
MCV: 96.2 fL (ref 80.0–100.0)
MONO ABS: 1.3 10*3/uL — AB (ref 0.1–1.0)
Monocytes Relative: 9 %
Neutro Abs: 9.7 10*3/uL — ABNORMAL HIGH (ref 1.7–7.7)
Neutrophils Relative %: 70 %
Platelets: 127 10*3/uL — ABNORMAL LOW (ref 150–400)
RBC: 3.39 MIL/uL — AB (ref 3.87–5.11)
RDW: 14.5 % (ref 11.5–15.5)
WBC: 13.8 10*3/uL — AB (ref 4.0–10.5)
nRBC: 0 % (ref 0.0–0.2)

## 2018-08-31 LAB — BASIC METABOLIC PANEL
Anion gap: 8 (ref 5–15)
BUN: 19 mg/dL (ref 6–20)
CO2: 25 mmol/L (ref 22–32)
Calcium: 8.2 mg/dL — ABNORMAL LOW (ref 8.9–10.3)
Chloride: 105 mmol/L (ref 98–111)
Creatinine, Ser: 1.75 mg/dL — ABNORMAL HIGH (ref 0.44–1.00)
GFR calc Af Amer: 39 mL/min — ABNORMAL LOW (ref >60)
GFR calc non Af Amer: 34 mL/min — ABNORMAL LOW (ref >60)
Glucose, Bld: 124 mg/dL — ABNORMAL HIGH (ref 70–99)
Potassium: 4.5 mmol/L (ref 3.5–5.1)
Sodium: 138 mmol/L (ref 135–145)

## 2018-08-31 LAB — MAGNESIUM: Magnesium: 2.3 mg/dL (ref 1.7–2.4)

## 2018-08-31 SURGERY — IRRIGATION AND DEBRIDEMENT ABSCESS
Anesthesia: General | Laterality: Left

## 2018-08-31 MED ORDER — BUPIVACAINE HCL (PF) 0.25 % IJ SOLN
INTRAMUSCULAR | Status: DC | PRN
  Administered 2018-08-31: 30 mL

## 2018-08-31 MED ORDER — DEXAMETHASONE SODIUM PHOSPHATE 10 MG/ML IJ SOLN
INTRAMUSCULAR | Status: AC
  Filled 2018-08-31: qty 1

## 2018-08-31 MED ORDER — MIDAZOLAM HCL 2 MG/2ML IJ SOLN
INTRAMUSCULAR | Status: DC | PRN
  Administered 2018-08-31: 1 mg via INTRAVENOUS

## 2018-08-31 MED ORDER — LIDOCAINE 2% (20 MG/ML) 5 ML SYRINGE
INTRAMUSCULAR | Status: DC | PRN
  Administered 2018-08-31: 60 mg via INTRAVENOUS

## 2018-08-31 MED ORDER — LIDOCAINE 2% (20 MG/ML) 5 ML SYRINGE
INTRAMUSCULAR | Status: AC
  Filled 2018-08-31: qty 5

## 2018-08-31 MED ORDER — MORPHINE SULFATE (PF) 2 MG/ML IV SOLN
1.0000 mg | INTRAVENOUS | Status: DC | PRN
  Administered 2018-09-01 (×3): 2 mg via INTRAVENOUS
  Administered 2018-09-01: 4 mg via INTRAVENOUS
  Administered 2018-09-02: 2 mg via INTRAVENOUS
  Administered 2018-09-02 – 2018-09-03 (×4): 4 mg via INTRAVENOUS
  Administered 2018-09-03: 2 mg via INTRAVENOUS
  Administered 2018-09-03: 4 mg via INTRAVENOUS
  Administered 2018-09-04: 2 mg via INTRAVENOUS
  Administered 2018-09-04 – 2018-09-06 (×6): 4 mg via INTRAVENOUS
  Administered 2018-09-08 – 2018-09-09 (×3): 2 mg via INTRAVENOUS
  Administered 2018-09-09 (×2): 4 mg via INTRAVENOUS
  Administered 2018-09-10: 2 mg via INTRAVENOUS
  Administered 2018-09-10 – 2018-09-11 (×2): 4 mg via INTRAVENOUS
  Filled 2018-08-31: qty 2
  Filled 2018-08-31: qty 1
  Filled 2018-08-31 (×6): qty 2
  Filled 2018-08-31 (×3): qty 1
  Filled 2018-08-31 (×5): qty 2
  Filled 2018-08-31 (×5): qty 1
  Filled 2018-08-31: qty 2
  Filled 2018-08-31: qty 1
  Filled 2018-08-31 (×5): qty 2

## 2018-08-31 MED ORDER — LACTATED RINGERS IV SOLN
INTRAVENOUS | Status: DC | PRN
  Administered 2018-08-31: 10:00:00 via INTRAVENOUS

## 2018-08-31 MED ORDER — PROPOFOL 10 MG/ML IV BOLUS
INTRAVENOUS | Status: DC | PRN
  Administered 2018-08-31: 200 ug via INTRAVENOUS
  Administered 2018-08-31: 50 ug via INTRAVENOUS

## 2018-08-31 MED ORDER — DEXAMETHASONE SODIUM PHOSPHATE 10 MG/ML IJ SOLN
INTRAMUSCULAR | Status: DC | PRN
  Administered 2018-08-31: 4 mg via INTRAVENOUS

## 2018-08-31 MED ORDER — ONDANSETRON HCL 4 MG/2ML IJ SOLN
INTRAMUSCULAR | Status: AC
  Filled 2018-08-31: qty 2

## 2018-08-31 MED ORDER — 0.9 % SODIUM CHLORIDE (POUR BTL) OPTIME
TOPICAL | Status: DC | PRN
  Administered 2018-08-31: 1000 mL

## 2018-08-31 MED ORDER — SUGAMMADEX SODIUM 200 MG/2ML IV SOLN
INTRAVENOUS | Status: AC
  Filled 2018-08-31: qty 2

## 2018-08-31 MED ORDER — ONDANSETRON HCL 4 MG/2ML IJ SOLN
INTRAMUSCULAR | Status: DC | PRN
  Administered 2018-08-31: 4 mg via INTRAVENOUS

## 2018-08-31 MED ORDER — PROPOFOL 10 MG/ML IV BOLUS
INTRAVENOUS | Status: AC
  Filled 2018-08-31: qty 40

## 2018-08-31 MED ORDER — ONDANSETRON HCL 4 MG/2ML IJ SOLN: 4.0000 mg | Freq: Once | INTRAMUSCULAR | Status: DC | PRN

## 2018-08-31 MED ORDER — PHENYLEPHRINE 40 MCG/ML (10ML) SYRINGE FOR IV PUSH (FOR BLOOD PRESSURE SUPPORT)
PREFILLED_SYRINGE | INTRAVENOUS | Status: AC
  Filled 2018-08-31: qty 10

## 2018-08-31 MED ORDER — FENTANYL CITRATE (PF) 100 MCG/2ML IJ SOLN
INTRAMUSCULAR | Status: DC | PRN
  Administered 2018-08-31 (×2): 25 ug via INTRAVENOUS

## 2018-08-31 MED ORDER — MIDAZOLAM HCL 2 MG/2ML IJ SOLN
INTRAMUSCULAR | Status: AC
  Filled 2018-08-31: qty 2

## 2018-08-31 MED ORDER — PHENYLEPHRINE 40 MCG/ML (10ML) SYRINGE FOR IV PUSH (FOR BLOOD PRESSURE SUPPORT)
PREFILLED_SYRINGE | INTRAVENOUS | Status: DC | PRN
  Administered 2018-08-31: 40 ug via INTRAVENOUS

## 2018-08-31 MED ORDER — BUPIVACAINE HCL (PF) 0.25 % IJ SOLN
INTRAMUSCULAR | Status: AC
  Filled 2018-08-31: qty 30

## 2018-08-31 MED ORDER — FENTANYL CITRATE (PF) 100 MCG/2ML IJ SOLN
INTRAMUSCULAR | Status: AC
  Filled 2018-08-31: qty 2

## 2018-08-31 MED ORDER — LIDOCAINE-EPINEPHRINE (PF) 1 %-1:200000 IJ SOLN
INTRAMUSCULAR | Status: AC
  Filled 2018-08-31: qty 30

## 2018-08-31 MED ORDER — FENTANYL CITRATE (PF) 100 MCG/2ML IJ SOLN: 25.0000 ug | INTRAMUSCULAR | Status: DC | PRN

## 2018-08-31 SURGICAL SUPPLY — 33 items
BLADE HEX COATED 2.75 (ELECTRODE) ×3 IMPLANT
BLADE SURG SZ10 CARB STEEL (BLADE) ×3 IMPLANT
BNDG GAUZE ELAST 4 BULKY (GAUZE/BANDAGES/DRESSINGS) ×3 IMPLANT
COVER SURGICAL LIGHT HANDLE (MISCELLANEOUS) ×3 IMPLANT
COVER WAND RF STERILE (DRAPES) ×3 IMPLANT
DECANTER SPIKE VIAL GLASS SM (MISCELLANEOUS) ×3 IMPLANT
DERMABOND ADVANCED (GAUZE/BANDAGES/DRESSINGS)
DERMABOND ADVANCED .7 DNX12 (GAUZE/BANDAGES/DRESSINGS) IMPLANT
DRAPE LAPAROSCOPIC ABDOMINAL (DRAPES) ×3 IMPLANT
DRAPE LAPAROTOMY T 102X78X121 (DRAPES) IMPLANT
DRAPE LAPAROTOMY TRNSV 102X78 (DRAPE) ×3 IMPLANT
DRAPE SHEET LG 3/4 BI-LAMINATE (DRAPES) IMPLANT
DRSG PAD ABDOMINAL 8X10 ST (GAUZE/BANDAGES/DRESSINGS) ×3 IMPLANT
ELECT PENCIL ROCKER SW 15FT (MISCELLANEOUS) ×3 IMPLANT
ELECT REM PT RETURN 15FT ADLT (MISCELLANEOUS) ×3 IMPLANT
GAUZE SPONGE 4X4 12PLY STRL (GAUZE/BANDAGES/DRESSINGS) ×3 IMPLANT
GLOVE BIOGEL PI IND STRL 6.5 (GLOVE) ×1 IMPLANT
GLOVE BIOGEL PI INDICATOR 6.5 (GLOVE) ×2
GLOVE SURG SIGNA 7.5 PF LTX (GLOVE) ×3 IMPLANT
GLOVE SURG SS PI 6.0 STRL IVOR (GLOVE) ×6 IMPLANT
GOWN STRL REUS W/ TWL XL LVL3 (GOWN DISPOSABLE) ×1 IMPLANT
GOWN STRL REUS W/TWL LRG LVL3 (GOWN DISPOSABLE) ×6 IMPLANT
GOWN STRL REUS W/TWL XL LVL3 (GOWN DISPOSABLE) ×5 IMPLANT
HOVERMATT SINGLE USE (MISCELLANEOUS) ×3 IMPLANT
KIT BASIN OR (CUSTOM PROCEDURE TRAY) ×3 IMPLANT
MARKER SKIN DUAL TIP RULER LAB (MISCELLANEOUS) ×3 IMPLANT
NEEDLE HYPO 25X1 1.5 SAFETY (NEEDLE) ×3 IMPLANT
PACK GENERAL/GYN (CUSTOM PROCEDURE TRAY) ×3 IMPLANT
STAPLER VISISTAT 35W (STAPLE) IMPLANT
SUT MNCRL AB 4-0 PS2 18 (SUTURE) ×3 IMPLANT
SUT VIC AB 3-0 SH 18 (SUTURE) IMPLANT
TOWEL OR 17X26 10 PK STRL BLUE (TOWEL DISPOSABLE) ×3 IMPLANT
YANKAUER SUCT BULB TIP 10FT TU (MISCELLANEOUS) ×3 IMPLANT

## 2018-08-31 NOTE — Transfer of Care (Signed)
Immediate Anesthesia Transfer of Care Note  Patient: Caitlin Taylor  Procedure(s) Performed: IRRIGATION AND DEBRIDEMENT ABSCESS THIGH/GROIN LEFT (Left )  Patient Location: PACU  Anesthesia Type:General  Level of Consciousness: awake, alert  and oriented  Airway & Oxygen Therapy: Patient Spontanous Breathing and Patient connected to face mask oxygen  Post-op Assessment: Report given to RN and Post -op Vital signs reviewed and stable  Post vital signs: Reviewed and stable  Last Vitals:  Vitals Value Taken Time  BP 121/86 08/31/2018 10:41 AM  Temp    Pulse 83 08/31/2018 10:42 AM  Resp 17 08/31/2018 10:42 AM  SpO2 100 % 08/31/2018 10:42 AM  Vitals shown include unvalidated device data.  Last Pain:  Vitals:   08/31/18 0930  TempSrc:   PainSc: 0-No pain      Patients Stated Pain Goal: 3 (79/48/01 6553)  Complications: No apparent anesthesia complications

## 2018-08-31 NOTE — Progress Notes (Signed)
PROGRESS NOTE    Caitlin DEGAN  Taylor:[id number] DOB: 09-18-1970 DOA: 08/27/2018 PCP: [name], MD    Brief Narrative:  Caitlin Taylor is a 48 y.o.femalewith medical history significant ofDVT and PE, hypertension, chronic kidney disease, obesity presenting with left groin pain and cellulitis.     Assessment & Plan:   Principal Problem:   Cellulitis of left groin Active Problems:   CKD (chronic kidney disease), stage III (HCC)   History of pulmonary embolus (PE)   Cellulitis   Abscess   Acute renal failure superimposed on stage 3 chronic kidney disease ([place])  1 left groin abscess/cellulitis Patient with no significant improvement with left groin abscess and cellulitis still with significant pain, mons pubis is swollen, left upper thigh with ongoing cellulitis and pain to palpation as of 08/30/2018.  Patient status post bedside I&D left groin abscess 08/28/2018 with cultures growing E. coli.  Abscess site with some purulent drainage.  IV antibiotics have been changed to IV Zosyn per general surgery.  Patient being followed by general surgery and patient underwent incision and drainage of left thigh abscess and left groin abscess with cultures sent off.  Initial cultures from bedside incision and drainage with E. coli.  Continue IV Zosyn. General surgery following and appreciate input and recommendations.  2.  Acute kidney injury on chronic kidney disease stage III Questionable etiology.  Creatinine was rising and as such IV vancomycin discontinued.  Creatinine went up as high as 2.40.  Creatinine trending down and currently at 1.75 from 1.86.  Blood pressure has been borderline.  Continue IV fluids and monitor closely for volume overload.    3.  Hypertension Blood pressure was borderline however improving.  Continue IV fluids for another 24 hours.  Spironolactone and torsemide on hold.  Continue metoprolol.  4.  Left adrenal mass Incidentally noted on CT.  Will need  outpatient follow-up MRI.   DVT prophylaxis: Lovenox Code Status: Full Family Communication: Updated patient.  No family at bedside. Disposition Plan: Home when clinically improved and medically stable and per general surgery.   Consultants:   General surgery: Dr. Ninfa Linden 08/28/2018  Procedures:   CT abdomen and pelvis 08/27/2018  Bedside I&D 08/28/2018 per general surgery--Kelly Rayburn, PA general surgery  Incision and drainage abscess left thigh and left groin per Dr. Ninfa Linden 08/31/2018  Antimicrobials:   IV Zosyn  08/30/2018  IV Rocephin 08/27/2018>>> 08/30/2018  IV vancomycin 08/27/2018>>>> 08/28/2018   Subjective: Patient returning from operating room after I&D of left thigh abscess.  Patient denies any chest pain no shortness of breath.   Objective: Vitals:   08/31/18 1100 08/31/18 1115 08/31/18 1130 08/31/18 1138  BP: (!) 130/93 124/87 132/71 125/84  Pulse: 76 74 78 77  Resp: 14 15 16 12   Temp:      TempSrc:      SpO2: 100% 100% 94% 96%  Weight:      Height:        Intake/Output Summary (Last 24 hours) at 08/31/2018 1314 Last data filed at 08/31/2018 1138 Gross per 24 hour  Intake 2474.98 ml  Output 50 ml  Net 2424.98 ml   Filed Weights   08/29/18 0500 08/31/18 0500 08/31/18 0930  Weight: 130.2 kg 130.8 kg 130.8 kg    Examination:  General exam: NAD Respiratory system: Lungs clear to auscultation bilaterally.  No wheezes, no crackles, no rhonchi.  Respiratory effort normal. Cardiovascular system: Regular rate rhythm no murmurs rubs or gallops.  No JVD.  No lower  extremity edema.  Gastrointestinal system: Abdomen is soft, nontender, nondistended, positive bowel sounds.  No rebound.  No guarding.   Central nervous system: Alert and oriented. No focal neurological deficits. Extremities: Symmetric 5 x 5 power. Genitourinary: Mons pubis swollen, tender to palpation, left groin with some purulent drainage and left upper thigh with erythema, swelling,  significant tenderness to palpation. Skin: No rashes, lesions or ulcers Psychiatry: Judgement and insight appear normal. Mood & affect appropriate.     Data Reviewed: I have personally reviewed following labs and imaging studies  CBC: Recent Labs  Lab 08/27/18 0305 08/27/18 0832 08/28/18 0357 08/29/18 0343 08/31/18 0350  WBC 16.3* 15.9* 14.8* 11.6* 13.8*  NEUTROABS 12.6*  --   --   --  9.7*  HGB 12.1 12.0 10.5* 10.8* 10.4*  HCT 37.8 37.4 32.7* 33.3* 32.6*  MCV 94.3 96.4 94.2 96.0 96.2  PLT 150 142* 113* 124* 275*   Basic Metabolic Panel: Recent Labs  Lab 08/27/18 0305 08/27/18 0832 08/28/18 0357 08/29/18 0343 08/30/18 0351 08/31/18 0350  NA 135  --  137 136 138 138  K 3.8  --  4.2 3.7 4.2 4.5  CL 101  --  103 100 103 105  CO2 24  --  23 25 24 25   GLUCOSE 118*  --  114* 88 124* 124*  BUN 35*  --  37* 35* 26* 19  CREATININE 1.97* 1.91* 2.40* 2.19* 1.86* 1.75*  CALCIUM 8.7*  --  8.5* 8.6* 8.6* 8.2*  MG  --   --   --   --   --  2.3   GFR: Estimated Creatinine Clearance: 52.6 mL/min (A) (by C-G formula based on SCr of 1.75 mg/dL (H)). Liver Function Tests: Recent Labs  Lab 08/27/18 0832 08/28/18 0357  AST  --  20  ALT  --  38  ALKPHOS  --  81  BILITOT  --  0.9  PROT  --  5.9*  ALBUMIN 3.5 2.9*   No results for input(s): LIPASE, AMYLASE in the last 168 hours. No results for input(s): AMMONIA in the last 168 hours. Coagulation Profile: No results for input(s): INR, PROTIME in the last 168 hours. Cardiac Enzymes: No results for input(s): CKTOTAL, CKMB, CKMBINDEX, TROPONINI in the last 168 hours. BNP (last 3 results) No results for input(s): PROBNP in the last 8760 hours. HbA1C: No results for input(s): HGBA1C in the last 72 hours. CBG: No results for input(s): GLUCAP in the last 168 hours. Lipid Profile: No results for input(s): CHOL, HDL, LDLCALC, TRIG, CHOLHDL, LDLDIRECT in the last 72 hours. Thyroid Function Tests: No results for input(s): TSH,  T4TOTAL, FREET4, T3FREE, THYROIDAB in the last 72 hours. Anemia Panel: No results for input(s): VITAMINB12, FOLATE, FERRITIN, TIBC, IRON, RETICCTPCT in the last 72 hours. Sepsis Labs: No results for input(s): PROCALCITON, LATICACIDVEN in the last 168 hours.  Recent Results (from the past 240 hour(s))  Culture, blood (single) w Reflex to ID Panel     Status: None (Preliminary result)   Collection Time: 08/27/18  5:30 AM  Result Value Ref Range Status   Specimen Description   Final    BLOOD RIGHT ANTECUBITAL Performed at [facility], [address] [place], [place] [address]    Special Requests   Final    BOTTLES DRAWN AEROBIC AND ANAEROBIC Blood Culture adequate volume Performed at [facility], [address] [place], [place] [address]    Culture   Final  NO GROWTH 4 DAYS Performed at [facility], [place] [address] [place], [place] [address]    Report Status PENDING  Incomplete  Aerobic Culture (superficial specimen)     Status: None   Collection Time: 08/28/18  1:23 PM  Result Value Ref Range Status   Specimen Description   Final    GROIN LEFT Performed at [facility], [place] [address] [place], [place] [address]    Special Requests   Final    NONE Performed at [facility], [place] [address], [place], [place] [address]    Gram Stain   Final    FEW WBC PRESENT,BOTH PMN AND MONONUCLEAR FEW GRAM POSITIVE COCCI RARE GRAM VARIABLE ROD Performed at [facility], [place] [address], [place], [place] [address]    Culture FEW ESCHERICHIA COLI FEW STREPTOCOCCUS GROUP G   Final   Report Status 08/30/2018 FINAL  Final   Organism ID, Bacteria ESCHERICHIA COLI  Final      Susceptibility   Escherichia coli - MIC*    AMPICILLIN >=32 RESISTANT Resistant     CEFAZOLIN 8 SENSITIVE Sensitive     CEFEPIME <=1 SENSITIVE Sensitive     CEFTAZIDIME <=1 SENSITIVE Sensitive     CEFTRIAXONE <=1 SENSITIVE Sensitive     CIPROFLOXACIN  <=0.25 SENSITIVE Sensitive     GENTAMICIN <=1 SENSITIVE Sensitive     IMIPENEM <=0.25 SENSITIVE Sensitive     TRIMETH/SULFA <=20 SENSITIVE Sensitive     AMPICILLIN/SULBACTAM >=32 RESISTANT Resistant     PIP/TAZO <=4 SENSITIVE Sensitive     Extended ESBL NEGATIVE Sensitive     * FEW ESCHERICHIA COLI         Radiology Studies: No results found.      Scheduled Meds: . allopurinol  100 mg Oral Daily  . colchicine  0.6 mg Oral BID  . enoxaparin (LOVENOX) injection  40 mg Subcutaneous Q24H  . Influenza vac split quadrivalent PF  0.5 mL Intramuscular Tomorrow-1000  . metoprolol tartrate  12.5 mg Oral BID  . norethindrone  1 tablet Oral QHS  . polyethylene glycol  17 g Oral Daily   Continuous Infusions: . sodium chloride Stopped (08/31/18 0229)  . sodium chloride 100 mL/hr at 08/31/18 1211  . doxycycline (VIBRAMYCIN) IV 100 mg (08/31/18 0840)  . piperacillin-tazobactam (ZOSYN)  IV 3.375 g (08/31/18 0229)     LOS: 4 days    Time spent: 89 minutes    Irine Seal, MD [facility]  If 7PM-7AM, please contact night-coverage [email or web address] 08/31/2018, 1:14 PM

## 2018-08-31 NOTE — Anesthesia Procedure Notes (Signed)
Procedure Name: LMA Insertion Date/Time: 08/31/2018 10:08 AM Performed by: Eben Burow, CRNA Pre-anesthesia Checklist: Patient identified, Emergency Drugs available, Suction available, Patient being monitored and Timeout performed Patient Re-evaluated:Patient Re-evaluated prior to induction Oxygen Delivery Method: Circle system utilized Preoxygenation: Pre-oxygenation with 100% oxygen Induction Type: IV induction Ventilation: Mask ventilation without difficulty LMA: LMA inserted LMA Size: 4.0 Number of attempts: 1 Tube secured with: Tape Dental Injury: Teeth and Oropharynx as per pre-operative assessment

## 2018-08-31 NOTE — Anesthesia Preprocedure Evaluation (Addendum)
Anesthesia Evaluation  Patient identified by MRN, date of birth, ID band Patient awake    Reviewed: Allergy & Precautions, NPO status , Patient's Chart, lab work & pertinent test results  Airway Mallampati: II  TM Distance: >3 FB Neck ROM: Full    Dental  (+) Dental Advisory Given   Pulmonary neg pulmonary ROS,    breath sounds clear to auscultation       Cardiovascular hypertension, Pt. on medications and Pt. on home beta blockers +CHF   Rhythm:Regular Rate:Normal     Neuro/Psych  Neuromuscular disease    GI/Hepatic negative GI ROS, Neg liver ROS,   Endo/Other  Morbid obesity  Renal/GU Renal disease     Musculoskeletal   Abdominal   Peds  Hematology  (+) anemia ,   Anesthesia Other Findings   Reproductive/Obstetrics                             Lab Results  Component Value Date   WBC 13.8 (H) 08/31/2018   HGB 10.4 (L) 08/31/2018   HCT 32.6 (L) 08/31/2018   MCV 96.2 08/31/2018   PLT 127 (L) 08/31/2018   Lab Results  Component Value Date   CREATININE 1.75 (H) 08/31/2018   BUN 19 08/31/2018   NA 138 08/31/2018   K 4.5 08/31/2018   CL 105 08/31/2018   CO2 25 08/31/2018    Anesthesia Physical Anesthesia Plan  ASA: III  Anesthesia Plan: General   Post-op Pain Management:    Induction: Intravenous  PONV Risk Score and Plan: 3 and Dexamethasone, Ondansetron and Treatment may vary due to age or medical condition  Airway Management Planned: LMA  Additional Equipment:   Intra-op Plan:   Post-operative Plan: Extubation in OR  Informed Consent: I have reviewed the patients History and Physical, chart, labs and discussed the procedure including the risks, benefits and alternatives for the proposed anesthesia with the patient or authorized representative who has indicated his/her understanding and acceptance.     Dental advisory given  Plan Discussed with:  CRNA  Anesthesia Plan Comments:        Anesthesia Quick Evaluation

## 2018-08-31 NOTE — Progress Notes (Signed)
Patient ID: [name], female   DOB: 1970-07-31, 48 y.o.   MRN: [id number]   Pre Procedure note for inpatients:   CHANNA HAZELETT has been scheduled for Procedure(s): IRRIGATION AND DEBRIDEMENT ABSCESS THIGH/GROIN LEFT (Left) today. The various methods of treatment have been discussed with the patient. After consideration of the risks, benefits and treatment options the patient has consented to the planned procedure.   The patient has been seen and labs reviewed. There are no changes in the patient's condition to prevent proceeding with the planned procedure today.  Recent labs:  Lab Results  Component Value Date   WBC 13.8 (H) 08/31/2018   HGB 10.4 (L) 08/31/2018   HCT 32.6 (L) 08/31/2018   PLT 127 (L) 08/31/2018   GLUCOSE 124 (H) 08/31/2018   ALT 38 08/28/2018   AST 20 08/28/2018   NA 138 08/31/2018   K 4.5 08/31/2018   CL 105 08/31/2018   CREATININE 1.75 (H) 08/31/2018   BUN 19 08/31/2018   CO2 25 08/31/2018   TSH 0.451 09/03/2016   HGBA1C 5.0 01/06/2016    Coralie Keens, MD 08/31/2018 9:36 AM

## 2018-08-31 NOTE — Op Note (Signed)
   [name] 08/31/2018   Pre-op Diagnosis: left groin / thigh abcess     Post-op Diagnosis: same  Procedure(s): INCISION AND DRAINAGE ABSCESS THIGH/GROIN LEFT  Surgeon(s): Coralie Keens, MD  Anesthesia: General  Staff:  Circulator: Anderson Malta, RN Relief Circulator: Ignacia Palma, RN Scrub Person: Lucy Antigua Circulator Assistant: Caprice Beaver, April C, CST  Estimated Blood Loss: Minimal               CULTURES SENT  Findings: There was extensive abscess in the anterior and lateral left thigh.  This did communicate with the left groin abscess.  Procedure: The patient was brought to the operating room and identified as the correct patient.  She is placed upon the operating table and general anesthesia was induced.  Her left groin and thigh were then prepped and draped in usual sterile fashion.  I had removed the packing from the left groin.  The patient had 3 draining areas on the left thigh.  I anesthetized skin with Marcaine and then made a large incision transversely with a scalpel.  There was a lot of dishwasher and purulent fluid.  Cultures were obtained.  There was no grossly necrotic tissue.  I probed multiple directions.  I then achieved hemostasis with the cautery.  I anesthetized the wound for with Marcaine.  I then irrigated extensively with saline.  I then packed both wounds with wet-to-dry saline soaked gauze.  Dry gauze and ABDs were then applied.  The patient tolerated the procedure well.  All the counts were correct at the end of the procedure.  The patient was then extubated in the operating room and taken a stable addition to the recovery room.          Coralie Keens   Date: 08/31/2018  Time: 10:32 AM

## 2018-09-01 ENCOUNTER — Inpatient Hospital Stay (HOSPITAL_COMMUNITY): Payer: 59

## 2018-09-01 ENCOUNTER — Encounter (HOSPITAL_COMMUNITY): Payer: Self-pay | Admitting: Surgery

## 2018-09-01 DIAGNOSIS — Z8719 Personal history of other diseases of the digestive system: Secondary | ICD-10-CM

## 2018-09-01 DIAGNOSIS — L02416 Cutaneous abscess of left lower limb: Secondary | ICD-10-CM

## 2018-09-01 DIAGNOSIS — L03116 Cellulitis of left lower limb: Secondary | ICD-10-CM

## 2018-09-01 DIAGNOSIS — B9620 Unspecified Escherichia coli [E. coli] as the cause of diseases classified elsewhere: Secondary | ICD-10-CM

## 2018-09-01 DIAGNOSIS — B954 Other streptococcus as the cause of diseases classified elsewhere: Secondary | ICD-10-CM

## 2018-09-01 DIAGNOSIS — L089 Local infection of the skin and subcutaneous tissue, unspecified: Secondary | ICD-10-CM

## 2018-09-01 DIAGNOSIS — L03314 Cellulitis of groin: Secondary | ICD-10-CM

## 2018-09-01 LAB — BASIC METABOLIC PANEL
ANION GAP: 9 (ref 5–15)
BUN: 18 mg/dL (ref 6–20)
CO2: 22 mmol/L (ref 22–32)
Calcium: 8.2 mg/dL — ABNORMAL LOW (ref 8.9–10.3)
Chloride: 108 mmol/L (ref 98–111)
Creatinine, Ser: 1.75 mg/dL — ABNORMAL HIGH (ref 0.44–1.00)
GFR calc non Af Amer: 34 mL/min — ABNORMAL LOW (ref >60)
GFR, EST AFRICAN AMERICAN: 39 mL/min — AB (ref >60)
Glucose, Bld: 148 mg/dL — ABNORMAL HIGH (ref 70–99)
Potassium: 4.8 mmol/L (ref 3.5–5.1)
Sodium: 139 mmol/L (ref 135–145)

## 2018-09-01 LAB — URINALYSIS, ROUTINE W REFLEX MICROSCOPIC
Bilirubin Urine: NEGATIVE
Glucose, UA: NEGATIVE mg/dL
Ketones, ur: NEGATIVE mg/dL
Nitrite: NEGATIVE
Protein, ur: 30 mg/dL — AB
Specific Gravity, Urine: 1.016 (ref 1.005–1.030)
pH: 5 (ref 5.0–8.0)

## 2018-09-01 LAB — CBC WITH DIFFERENTIAL/PLATELET
Abs Immature Granulocytes: 0 10*3/uL (ref 0.00–0.07)
BASOS ABS: 0 10*3/uL (ref 0.0–0.1)
Band Neutrophils: 7 %
Basophils Relative: 0 %
Eosinophils Absolute: 0 10*3/uL (ref 0.0–0.5)
Eosinophils Relative: 0 %
HCT: 33.1 % — ABNORMAL LOW (ref 36.0–46.0)
Hemoglobin: 10.4 g/dL — ABNORMAL LOW (ref 12.0–15.0)
Lymphocytes Relative: 7 %
Lymphs Abs: 1.4 10*3/uL (ref 0.7–4.0)
MCH: 30.6 pg (ref 26.0–34.0)
MCHC: 31.4 g/dL (ref 30.0–36.0)
MCV: 97.4 fL (ref 80.0–100.0)
Monocytes Absolute: 1.6 10*3/uL — ABNORMAL HIGH (ref 0.1–1.0)
Monocytes Relative: 8 %
NEUTROS PCT: 78 %
Neutro Abs: 17.1 10*3/uL — ABNORMAL HIGH (ref 1.7–7.7)
Platelets: 145 10*3/uL — ABNORMAL LOW (ref 150–400)
RBC: 3.4 MIL/uL — ABNORMAL LOW (ref 3.87–5.11)
RDW: 14.6 % (ref 11.5–15.5)
WBC: 20.1 10*3/uL — ABNORMAL HIGH (ref 4.0–10.5)
nRBC: 0 % (ref 0.0–0.2)

## 2018-09-01 LAB — CULTURE, BLOOD (SINGLE)
Culture: NO GROWTH
Special Requests: ADEQUATE

## 2018-09-01 NOTE — Progress Notes (Signed)
1 Day Post-Op    CC:  cellulitis  Subjective: Pt OK after I&D in OR.  Very painful both sites, dressing changed. More swelling of the vulva and left thigh.    Objective: Vital signs in last 24 hours: Temp:  [97.8 F (36.6 C)-98.8 F (37.1 C)] 98.8 F (37.1 C) (02/21 0614) Pulse Rate:  [74-86] 84 (02/21 0614) Resp:  [12-20] 20 (02/21 0614) BP: (101-139)/(60-93) 139/92 (02/21 0614) SpO2:  [94 %-100 %] 96 % (02/21 0614) Weight:  [130.8 kg-135.2 kg] 135.2 kg (02/21 0614) Last BM Date: 08/31/18 360 Po 1600 IV Urine x 4 Bm x 1 Afebrile, VSS WBC up to 20.1    Intake/Output from previous day: 02/20 0701 - 02/21 0700 In: 2051.5 [P.O.:360; I.V.:1416.7; IV Piggyback:274.8] Out: 50 [Blood:50] Intake/Output this shift: No intake/output data recorded.  General appearance: alert, cooperative, moderate distress and dressing change is very painful even with 4 mg IV Morphine Skin: the skin over the vulva and lateral left lower thigh buttocks with celluslitis and almost feels like bubble wrap.    She has open site that looks good, but area to the left of incision is still swollen and worrisome.   Thigh site, with some early skin necrosis, It is not pictured here but thigh towards lateral buttocks also swollen and cellulitis persisting   Lab Results:  Recent Labs    08/31/18 0350 09/01/18 0340  WBC 13.8* 20.1*  HGB 10.4* 10.4*  HCT 32.6* 33.1*  PLT 127* 145*    BMET Recent Labs    08/31/18 0350 09/01/18 0340  NA 138 139  K 4.5 4.8  CL 105 108  CO2 25 22  GLUCOSE 124* 148*  BUN 19 18  CREATININE 1.75* 1.75*  CALCIUM 8.2* 8.2*   PT/INR No results for input(s): LABPROT, INR in the last 72 hours.  Recent Labs  Lab 08/27/18 0832 08/28/18 0357  AST  --  20  ALT  --  38  ALKPHOS  --  81  BILITOT  --  0.9  PROT  --  5.9*  ALBUMIN 3.5 2.9*     Lipase  No results found for: LIPASE   Medications: . allopurinol  100 mg Oral Daily  . colchicine  0.6 mg Oral  BID  . enoxaparin (LOVENOX) injection  40 mg Subcutaneous Q24H  . Influenza vac split quadrivalent PF  0.5 mL Intramuscular Tomorrow-1000  . metoprolol tartrate  12.5 mg Oral BID  . norethindrone  1 tablet Oral QHS  . polyethylene glycol  17 g Oral Daily   Culture 08/28/18.  FEW ESCHERICHIA COLI  FEW STREPTOCOCCUS GROUP G    Gram Stain 08/31/18 FEW WBC PRESENT, PREDOMINANTLY PMN  NO ORGANISMS SEEN       Assessment/Plan Hypertension Acute kidney injury1.97>>2.40>>2.19 today Left adrenal mass Morbid obesity - BMI 50.8 WBC 16.3>>14.8>>11.6>>13.8>>20.1 today   Left groin abscess/cellulitis I&D left groin abscess, 08/28/18 INCISION AND DRAINAGE ABSCESS THIGH/GROIN LEFT, 08/31/18, Dr. Ninfa Linden   FEN: Heart healthy/carb mod ID: Vancomycin 2/16-17/20, Rocephin 2/16 -2/18; doxycycline 2/19 >>day 3; Zosyn 2/19>> day 3 DVT: Lovenox Follow up: TBD   Plan:  Dressing changes with IV pain meds, Will look at it tomorrow and Keep her NPO after MN, just in case she needs to be taken back to OR.  Continue antibiotics.    LOS: 5 days    Jerard Bays 09/01/2018 [phone]

## 2018-09-01 NOTE — Anesthesia Postprocedure Evaluation (Signed)
Anesthesia Post Note  Patient: Caitlin Taylor  Procedure(s) Performed: IRRIGATION AND DEBRIDEMENT ABSCESS THIGH/GROIN LEFT (Left )     Patient location during evaluation: PACU Anesthesia Type: General Level of consciousness: awake and alert Pain management: pain level controlled Vital Signs Assessment: post-procedure vital signs reviewed and stable Respiratory status: spontaneous breathing, nonlabored ventilation, respiratory function stable and patient connected to nasal cannula oxygen Cardiovascular status: blood pressure returned to baseline and stable Postop Assessment: no apparent nausea or vomiting Anesthetic complications: no    Last Vitals:  Vitals:   08/31/18 2254 09/01/18 0614  BP: 120/85 (!) 139/92  Pulse: 81 84  Resp: 20 20  Temp: 36.7 C 37.1 C  SpO2: 97% 96%    Last Pain:  Vitals:   09/01/18 0614  TempSrc: Oral  PainSc:    Pain Goal: Patients Stated Pain Goal: 3 (08/30/18 2300)                 Tiajuana Amass

## 2018-09-01 NOTE — Consult Note (Signed)
[facility] for Infect[facility]    Date of Admission:  08/27/2018           Day 6 doxycycline        Day 3 piperacillin tazobactam       Reason for Consult: Polymicrobial back left groin and thigh abscess with cellulitis    Referring Provider: Dr. Irine Seal  Assessment: She has a polymicrobial soft tissue infection with multiple abscesses and cellulitis.  This is probably a mixed aerobic/anaerobic infection.  I recommend continuing piperacillin tazobactam alone for now.  If she continues to improve and does not need any further surgery I would consider conversion to oral therapy with trimethoprim sulfamethoxazole and amoxicillin clavulanate in the next 2 to 3 days and plan on 10 to 14 days of total therapy.  Plan: 1. Continue piperacillin tazobactam for now 2. Discontinue doxycycline 3. Please call Dr. Lita Mains [phone]) for any infectious disease questions this weekend  Principal Problem:   Abscess Active Problems:   Cellulitis of left groin   Essential hypertension   Morbid obesity due to excess calories (HCC)   CKD (chronic kidney disease), stage III ([place])   History of pulmonary embolus (PE)   Acute renal failure superimposed on stage 3 chronic kidney disease (HCC)   Scheduled Meds: . allopurinol  100 mg Oral Daily  . colchicine  0.6 mg Oral BID  . enoxaparin (LOVENOX) injection  40 mg Subcutaneous Q24H  . Influenza vac split quadrivalent PF  0.5 mL Intramuscular Tomorrow-1000  . metoprolol tartrate  12.5 mg Oral BID  . norethindrone  1 tablet Oral QHS  . polyethylene glycol  17 g Oral Daily   Continuous Infusions: . sodium chloride Stopped (08/31/18 0229)  . doxycycline (VIBRAMYCIN) IV 100 mg (09/01/18 0800)  . piperacillin-tazobactam (ZOSYN)  IV 3.375 g (09/01/18 1244)   PRN Meds:.sodium chloride, acetaminophen **OR** acetaminophen, morphine injection, ondansetron (ZOFRAN) IV, oxyCODONE  HPI: Caitlin Taylor is a 48 y.o. female  with obesity who had sudden onset of sharp left groin pain 5 days ago leading to admission.  She had a low-grade fever of 100.7 degrees.  CT scan revealed left thigh/groin phlegmon without obvious abscess.  She underwent bedside I&D on 08/28/2017.  Abscess Gram stain showed gram-positive cocci and gram variable rods.  Cultures have grown E. coli and group G strep.  She was initially on ceftriaxone and doxycycline.  Ceftriaxone was changed to piperacillin tazobactam 3 days ago.  She was not showing much improvement so she underwent repeat I&D in the OR yesterday.  No organisms were seen on Gram stain.  Culture is growing E. coli again.  She has been afebrile since admission.  She is feeling a little bit better.  She was admitted 2 years ago with a right abdominal wall abscess.  She denies a history of other soft tissue infections.   Review of Systems: Review of Systems  Constitutional: Negative for chills, diaphoresis and fever.  Genitourinary:       Pain, swelling and redness in her left groin wrapping around to her left buttock.    Past Medical History:  Diagnosis Date  . Chronic back pain   . CKD (chronic kidney disease) stage 2, GFR 60-89 ml/min   . DVT (deep venous thrombosis) ([place])   . Hypertension   . Lumbar stenosis   . Morbid obesity ([place])   . Pulmonary embolism ([place])    a. diagnosed in 08/2016. Started on  Eliquis    Social History   Tobacco Use  . Smoking status: Never Smoker  . Smokeless tobacco: Never Used  Substance Use Topics  . Alcohol use: No  . Drug use: No    Family History  Problem Relation Age of Onset  . Congestive Heart Failure Father    No Known Allergies  OBJECTIVE: Blood pressure 111/72, pulse 94, temperature 98.2 F (36.8 C), temperature source Oral, resp. rate 16, height 5\' 3"  (1.6 m), weight 135.2 kg, SpO2 99 %.  Physical Exam Constitutional:      Comments: She is alert and pleasant sitting on the side of her bed.  Cardiovascular:     Rate and  Rhythm: Normal rate and regular rhythm.     Heart sounds: No murmur.  Pulmonary:     Effort: Pulmonary effort is normal.     Breath sounds: Normal breath sounds.  Abdominal:     Palpations: Abdomen is soft.     Tenderness: There is no abdominal tenderness.     Comments: Her left groin and left anterior thigh wounds are packed.  There is some serosanguineous drainage on the dressings.  She still has extensive induration and erythema over her left buttock.  There is no obvious fluctuance.     Lab Results Lab Results  Component Value Date   WBC 20.1 (H) 09/01/2018   HGB 10.4 (L) 09/01/2018   HCT 33.1 (L) 09/01/2018   MCV 97.4 09/01/2018   PLT 145 (L) 09/01/2018    Lab Results  Component Value Date   CREATININE 1.75 (H) 09/01/2018   BUN 18 09/01/2018   NA 139 09/01/2018   K 4.8 09/01/2018   CL 108 09/01/2018   CO2 22 09/01/2018    Lab Results  Component Value Date   ALT 38 08/28/2018   AST 20 08/28/2018   ALKPHOS 81 08/28/2018   BILITOT 0.9 08/28/2018     Microbiology: Recent Results (from the past 240 hour(s))  Culture, blood (single) w Reflex to ID Panel     Status: None   Collection Time: 08/27/18  5:30 AM  Result Value Ref Range Status   Specimen Description BLOOD RIGHT ANTECUBITAL  Final   Special Requests   Final    BOTTLES DRAWN AEROBIC AND ANAEROBIC Blood Culture adequate volume Performed at [facility], [address] [place], [place] [address]    Culture NO GROWTH 5 DAYS  Final   Report Status 09/01/2018 FINAL  Final  Aerobic Culture (superficial specimen)     Status: None   Collection Time: 08/28/18  1:23 PM  Result Value Ref Range Status   Specimen Description   Final    GROIN LEFT Performed at [facility], [place] [address] [place], [place] [address]    Special Requests   Final    NONE Performed at [facility], [place] [address], [place], [place] [address]    Gram Stain   Final    FEW WBC PRESENT,BOTH PMN AND  MONONUCLEAR FEW GRAM POSITIVE COCCI RARE GRAM VARIABLE ROD Performed at [facility], [place] [address] [place], [place] [address]    Culture FEW ESCHERICHIA COLI FEW STREPTOCOCCUS GROUP G   Final   Report Status 08/30/2018 FINAL  Final   Organism ID, Bacteria ESCHERICHIA COLI  Final      Susceptibility   Escherichia coli - MIC*    AMPICILLIN >=32 RESISTANT Resistant     CEFAZOLIN 8 SENSITIVE Sensitive  CEFEPIME <=1 SENSITIVE Sensitive     CEFTAZIDIME <=1 SENSITIVE Sensitive     CEFTRIAXONE <=1 SENSITIVE Sensitive     CIPROFLOXACIN <=0.25 SENSITIVE Sensitive     GENTAMICIN <=1 SENSITIVE Sensitive     IMIPENEM <=0.25 SENSITIVE Sensitive     TRIMETH/SULFA <=20 SENSITIVE Sensitive     AMPICILLIN/SULBACTAM >=32 RESISTANT Resistant     PIP/TAZO <=4 SENSITIVE Sensitive     Extended ESBL NEGATIVE Sensitive     * FEW ESCHERICHIA COLI  Aerobic/Anaerobic Culture (surgical/deep wound)     Status: None (Preliminary result)   Collection Time: 08/31/18 10:20 AM  Result Value Ref Range Status   Specimen Description   Final    SFT TISS OTH Performed at [facility] [address], [place], [place] [address]    Special Requests   Final    NONE Performed at [facility], [place] [address], [place], [place] [address]    Gram Stain   Final    FEW WBC PRESENT, PREDOMINANTLY PMN NO ORGANISMS SEEN Performed at [facility], [place] [address] [place], [place] [address]    Culture FEW ESCHERICHIA COLI  Final   Report Status PENDING  Incomplete  Culture, blood (Routine X 2) w Reflex to ID Panel     Status: None (Preliminary result)   Collection Time: 09/01/18  9:30 AM  Result Value Ref Range Status   Specimen Description BLOOD LEFT HAND  Final   Special Requests   Final    BOTTLES DRAWN AEROBIC ONLY Blood Culture results may not be optimal due to an inadequate volume of blood received in culture bottles Performed at [facility],  [place] [address] [place], [place] [address]    Culture NO GROWTH < 12 HOURS  Final   Report Status PENDING  Incomplete  Culture, blood (Routine X 2) w Reflex to ID Panel     Status: None (Preliminary result)   Collection Time: 09/01/18  9:32 AM  Result Value Ref Range Status   Specimen Description BLOOD RIGHT HAND  Final   Special Requests   Final    BOTTLES DRAWN AEROBIC ONLY Blood Culture adequate volume Performed at [facility] [address], [place], [place] [address]    Culture NO GROWTH < 12 HOURS  Final   Report Status PENDING  Incomplete    Michel Bickers, MD [facility] [place] [phone] pager   [phone] cell 09/01/2018, 3:50 PM

## 2018-09-01 NOTE — Progress Notes (Addendum)
PROGRESS NOTE    Caitlin Taylor  GQQ:[id number] DOB: [birth year] DOA: 08/27/2018 PCP: [name], MD    Brief Narrative:  Caitlin Taylor is a 48 y.o.femalewith medical history significant ofDVT and PE, hypertension, chronic kidney disease, obesity presenting with left groin pain and cellulitis.     Assessment & Plan:   Principal Problem:   Abscess Active Problems:   Essential hypertension   Morbid obesity due to excess calories (HCC)   CKD (chronic kidney disease), stage III (HCC)   History of pulmonary embolus (PE)   Cellulitis of left groin   Acute renal failure superimposed on stage 3 chronic kidney disease ([place])  1 left groin abscess/cellulitis Patient with no significant improvement with left groin abscess and cellulitis still with significant pain, mons pubis is swollen, left upper thigh with ongoing cellulitis and pain to palpation as of 08/30/2018.  Patient status post bedside I&D left groin abscess 08/28/2018 with cultures growing E. coli.  Abscess site with some purulent drainage.  IV antibiotics have been changed to IV Zosyn per general surgery.  Patient being followed by general surgery and patient underwent incision and drainage of left thigh abscess and left groin abscess with cultures sent off on 08/31/2018..  Initial cultures from bedside incision and drainage with E. coli and Streptococcus group G.  Cultures from I&D done on 08/31/2018 with preliminary findings with few E. coli.  Due to extensive nature of patient's cellulitis left thigh abscess and groin abscess will consult with ID for antibiotic recommendations and duration.  Continue IV Zosyn.  Patient being followed by general surgery and patient may need to be taken back to the OR tomorrow per general surgery.  General surgery following and appreciate input and recommendations.  2.  Acute kidney injury on chronic kidney disease stage III Questionable etiology.  Creatinine was rising and as such IV vancomycin  discontinued.  Creatinine went up as high as 2.40.  Creatinine trending down and currently at 1.75 from 1.75 from 1.86.  Blood pressure has been borderline.  Saline lock IV fluids.  Follow.    3.  Hypertension Blood pressure was borderline however improved.  IV fluids have been saline locked.  Spironolactone and torsemide on hold.  Continue metoprolol.    4.  Left adrenal mass Incidentally noted on CT.  Will need outpatient follow-up MRI.  5.  Leukocytosis Patient noted with a worsening leukocytosis today with a white count up to 20 from 13.8.  May be secondary to problem #1.  Will repeat blood cultures x2, check a chest x-ray, check a UA with cultures and sensitivities.  Continue empiric IV Zosyn.  ID has been consulted.   DVT prophylaxis: Lovenox Code Status: Full Family Communication: Updated patient.  No family at bedside. Disposition Plan: Home when clinically improved and medically stable and per general surgery.   Consultants:   General surgery: Dr. Ninfa Linden 08/28/2018  Infectious disease: Dr. Megan Salon 09/01/2018  Procedures:   CT abdomen and pelvis 08/27/2018  Bedside I&D 08/28/2018 per general surgery--Kelly Rayburn, PA general surgery  Incision and drainage abscess left thigh and left groin per Dr. Ninfa Linden 08/31/2018  Antimicrobials:   IV Zosyn  08/30/2018  IV Rocephin 08/27/2018>>> 08/30/2018  IV vancomycin 08/27/2018>>>> 08/28/2018   Subjective: Patient laying in bed.  Per RN significant pain with dressing changes.  No chest pain.  No shortness of breath.  Patient states has been having bowel movements.   Objective: Vitals:   08/31/18 1425 08/31/18 2254 09/01/18 6712 09/01/18 1351  BP: 116/75 120/85 (!) 139/92 111/72  Pulse: 78 81 84 94  Resp: 16 20 20 16   Temp: 98.2 F (36.8 C) 98 F (36.7 C) 98.8 F (37.1 C) 98.2 F (36.8 C)  TempSrc: Oral Oral Oral Oral  SpO2: 97% 97% 96% 99%  Weight:   135.2 kg   Height:        Intake/Output Summary (Last 24 hours)  at 09/01/2018 2009 Last data filed at 09/01/2018 1522 Gross per 24 hour  Intake 1517.57 ml  Output 800 ml  Net 717.57 ml   Filed Weights   08/31/18 0500 08/31/18 0930 09/01/18 0614  Weight: 130.8 kg 130.8 kg 135.2 kg    Examination:  General exam: NAD Respiratory system: CTA B.  No wheezes, no crackles, no rhonchi.  No use of accessory muscles of respiration.  Speaking in full sentences.  Normal respiratory effort.  Cardiovascular system: RRR no murmurs rubs or gallops.  No JVD.  No lower extremity edema.  Gastrointestinal system: Abdomen is nontender, distended, soft, positive bowel sounds.  No rebound.  No guarding.  Central nervous system: Alert and oriented. No focal neurological deficits. Extremities: Symmetric 5 x 5 power. Genitourinary: Mons pubis swollen, tender to palpation with some induration, left groin with packing, left upper thigh erythema slightly less with less swelling however significant tenderness to palpation.  Skin: No rashes, lesions or ulcers Psychiatry: Judgement and insight appear normal. Mood & affect appropriate.     Data Reviewed: I have personally reviewed following labs and imaging studies  CBC: Recent Labs  Lab 08/27/18 0305 08/27/18 0832 08/28/18 0357 08/29/18 0343 08/31/18 0350 09/01/18 0340  WBC 16.3* 15.9* 14.8* 11.6* 13.8* 20.1*  NEUTROABS 12.6*  --   --   --  9.7* 17.1*  HGB 12.1 12.0 10.5* 10.8* 10.4* 10.4*  HCT 37.8 37.4 32.7* 33.3* 32.6* 33.1*  MCV 94.3 96.4 94.2 96.0 96.2 97.4  PLT 150 142* 113* 124* 127* 169*   Basic Metabolic Panel: Recent Labs  Lab 08/28/18 0357 08/29/18 0343 08/30/18 0351 08/31/18 0350 09/01/18 0340  NA 137 136 138 138 139  K 4.2 3.7 4.2 4.5 4.8  CL 103 100 103 105 108  CO2 23 25 24 25 22   GLUCOSE 114* 88 124* 124* 148*  BUN 37* 35* 26* 19 18  CREATININE 2.40* 2.19* 1.86* 1.75* 1.75*  CALCIUM 8.5* 8.6* 8.6* 8.2* 8.2*  MG  --   --   --  2.3  --    GFR: Estimated Creatinine Clearance: 53.6 mL/min  (A) (by C-G formula based on SCr of 1.75 mg/dL (H)). Liver Function Tests: Recent Labs  Lab 08/27/18 0832 08/28/18 0357  AST  --  20  ALT  --  38  ALKPHOS  --  81  BILITOT  --  0.9  PROT  --  5.9*  ALBUMIN 3.5 2.9*   No results for input(s): LIPASE, AMYLASE in the last 168 hours. No results for input(s): AMMONIA in the last 168 hours. Coagulation Profile: No results for input(s): INR, PROTIME in the last 168 hours. Cardiac Enzymes: No results for input(s): CKTOTAL, CKMB, CKMBINDEX, TROPONINI in the last 168 hours. BNP (last 3 results) No results for input(s): PROBNP in the last 8760 hours. HbA1C: No results for input(s): HGBA1C in the last 72 hours. CBG: No results for input(s): GLUCAP in the last 168 hours. Lipid Profile: No results for input(s): CHOL, HDL, LDLCALC, TRIG, CHOLHDL, LDLDIRECT in the last 72 hours. Thyroid Function Tests: No results for input(s):  TSH, T4TOTAL, FREET4, T3FREE, THYROIDAB in the last 72 hours. Anemia Panel: No results for input(s): VITAMINB12, FOLATE, FERRITIN, TIBC, IRON, RETICCTPCT in the last 72 hours. Sepsis Labs: No results for input(s): PROCALCITON, LATICACIDVEN in the last 168 hours.  Recent Results (from the past 240 hour(s))  Culture, blood (single) w Reflex to ID Panel     Status: None   Collection Time: 08/27/18  5:30 AM  Result Value Ref Range Status   Specimen Description BLOOD RIGHT ANTECUBITAL  Final   Special Requests   Final    BOTTLES DRAWN AEROBIC AND ANAEROBIC Blood Culture adequate volume Performed at [facility], [place] [place], [place] [address]    Culture NO GROWTH 5 DAYS  Final   Report Status 09/01/2018 FINAL  Final  Aerobic Culture (superficial specimen)     Status: None   Collection Time: 08/28/18  1:23 PM  Result Value Ref Range Status   Specimen Description   Final    GROIN LEFT Performed at [facility], [place] [address] [place], [place] [address]    Special Requests   Final     NONE Performed at [facility], [place] [address], [place], [place] [address]    Gram Stain   Final    FEW WBC PRESENT,BOTH PMN AND MONONUCLEAR FEW GRAM POSITIVE COCCI RARE GRAM VARIABLE ROD Performed at [facility], [place] [address], [place], [place] [address]    Culture FEW ESCHERICHIA COLI FEW STREPTOCOCCUS GROUP G   Final   Report Status 08/30/2018 FINAL  Final   Organism ID, Bacteria ESCHERICHIA COLI  Final      Susceptibility   Escherichia coli - MIC*    AMPICILLIN >=32 RESISTANT Resistant     CEFAZOLIN 8 SENSITIVE Sensitive     CEFEPIME <=1 SENSITIVE Sensitive     CEFTAZIDIME <=1 SENSITIVE Sensitive     CEFTRIAXONE <=1 SENSITIVE Sensitive     CIPROFLOXACIN <=0.25 SENSITIVE Sensitive     GENTAMICIN <=1 SENSITIVE Sensitive     IMIPENEM <=0.25 SENSITIVE Sensitive     TRIMETH/SULFA <=20 SENSITIVE Sensitive     AMPICILLIN/SULBACTAM >=32 RESISTANT Resistant     PIP/TAZO <=4 SENSITIVE Sensitive     Extended ESBL NEGATIVE Sensitive     * FEW ESCHERICHIA COLI  Aerobic/Anaerobic Culture (surgical/deep wound)     Status: None (Preliminary result)   Collection Time: 08/31/18 10:20 AM  Result Value Ref Range Status   Specimen Description   Final    SFT TISS OTH Performed at [place] [address] [place], [place] [address]    Special Requests   Final    NONE Performed at [facility], [place] [address], [place], [place] [address]    Gram Stain   Final    FEW WBC PRESENT, PREDOMINANTLY PMN NO ORGANISMS SEEN Performed at [facility], [place] [address] [place], [place] [address]    Culture FEW ESCHERICHIA COLI  Final   Report Status PENDING  Incomplete  Culture, blood (Routine X 2) w Reflex to ID Panel     Status: None (Preliminary result)   Collection Time: 09/01/18  9:30 AM  Result Value Ref Range Status   Specimen Description BLOOD LEFT HAND  Final   Special Requests   Final    BOTTLES DRAWN AEROBIC ONLY  Blood Culture results may not be optimal due to an inadequate volume of blood received in culture bottles Performed at [facility]  [place] [address] [place], [place] [address]    Culture NO GROWTH < 12 HOURS  Final   Report Status PENDING  Incomplete  Culture, blood (Routine X 2) w Reflex to ID Panel     Status: None (Preliminary result)   Collection Time: 09/01/18  9:32 AM  Result Value Ref Range Status   Specimen Description BLOOD RIGHT HAND  Final   Special Requests   Final    BOTTLES DRAWN AEROBIC ONLY Blood Culture adequate volume Performed at [place] [address] [place], [place] [address]    Culture NO GROWTH < 12 HOURS  Final   Report Status PENDING  Incomplete         Radiology Studies: Dg Chest 2 View  Result Date: 09/01/2018 CLINICAL DATA:  Shortness of breath.  Leukocytosis. EXAM: CHEST - 2 VIEW COMPARISON:  Chest CT 09/03/2016 FINDINGS: Cardiomediastinal silhouette is normal. Mediastinal contours appear intact. There is no evidence of focal airspace consolidation, pleural effusion or pneumothorax. Osseous structures are without acute abnormality. Soft tissues are grossly normal. IMPRESSION: No active cardiopulmonary disease. Electronically Signed   By: Fidela Salisbury M.D.   On: 09/01/2018 11:12        Scheduled Meds: . allopurinol  100 mg Oral Daily  . colchicine  0.6 mg Oral BID  . enoxaparin (LOVENOX) injection  40 mg Subcutaneous Q24H  . Influenza vac split quadrivalent PF  0.5 mL Intramuscular Tomorrow-1000  . metoprolol tartrate  12.5 mg Oral BID  . norethindrone  1 tablet Oral QHS  . polyethylene glycol  17 g Oral Daily   Continuous Infusions: . sodium chloride Stopped (08/31/18 0229)  . piperacillin-tazobactam (ZOSYN)  IV 3.375 g (09/01/18 1705)     LOS: 5 days    Time spent: 35 minutes    Irine Seal, MD [facility]  If 7PM-7AM, please contact  night-coverage [email or web address] 09/01/2018, 8:09 PM

## 2018-09-02 LAB — BASIC METABOLIC PANEL
Anion gap: 6 (ref 5–15)
BUN: 18 mg/dL (ref 6–20)
CO2: 23 mmol/L (ref 22–32)
Calcium: 8.4 mg/dL — ABNORMAL LOW (ref 8.9–10.3)
Chloride: 111 mmol/L (ref 98–111)
Creatinine, Ser: 1.6 mg/dL — ABNORMAL HIGH (ref 0.44–1.00)
GFR calc Af Amer: 44 mL/min — ABNORMAL LOW (ref >60)
GFR calc non Af Amer: 38 mL/min — ABNORMAL LOW (ref >60)
Glucose, Bld: 92 mg/dL (ref 70–99)
Potassium: 5.3 mmol/L — ABNORMAL HIGH (ref 3.5–5.1)
Sodium: 140 mmol/L (ref 135–145)

## 2018-09-02 LAB — CBC WITH DIFFERENTIAL/PLATELET
ABS IMMATURE GRANULOCYTES: 1.8 10*3/uL — AB (ref 0.00–0.07)
Basophils Absolute: 0.1 10*3/uL (ref 0.0–0.1)
Basophils Relative: 1 %
Eosinophils Absolute: 0 10*3/uL (ref 0.0–0.5)
Eosinophils Relative: 0 %
HCT: 31.7 % — ABNORMAL LOW (ref 36.0–46.0)
HEMOGLOBIN: 10 g/dL — AB (ref 12.0–15.0)
Immature Granulocytes: 9 %
LYMPHS ABS: 1.1 10*3/uL (ref 0.7–4.0)
LYMPHS PCT: 6 %
MCH: 30.6 pg (ref 26.0–34.0)
MCHC: 31.5 g/dL (ref 30.0–36.0)
MCV: 96.9 fL (ref 80.0–100.0)
Monocytes Absolute: 1 10*3/uL (ref 0.1–1.0)
Monocytes Relative: 5 %
Neutro Abs: 15.8 10*3/uL — ABNORMAL HIGH (ref 1.7–7.7)
Neutrophils Relative %: 79 %
Platelets: 177 10*3/uL (ref 150–400)
RBC: 3.27 MIL/uL — ABNORMAL LOW (ref 3.87–5.11)
RDW: 14.6 % (ref 11.5–15.5)
WBC: 19.8 10*3/uL — ABNORMAL HIGH (ref 4.0–10.5)
nRBC: 0 % (ref 0.0–0.2)

## 2018-09-02 LAB — POTASSIUM: Potassium: 4.4 mmol/L (ref 3.5–5.1)

## 2018-09-02 NOTE — Progress Notes (Signed)
2 Days Post-Op   Subjective/Chief Complaint: Complains of pain in groin   Objective: Vital signs in last 24 hours: Temp:  [97.9 F (36.6 C)-98.7 F (37.1 C)] 98.7 F (37.1 C) (02/22 0510) Pulse Rate:  [86-94] 87 (02/22 0510) Resp:  [16-18] 17 (02/22 0510) BP: (111-142)/(72-81) 142/81 (02/22 0510) SpO2:  [96 %-100 %] 96 % (02/22 0510) Last BM Date: 08/31/18  Intake/Output from previous day: 02/21 0701 - 02/22 0700 In: 525.4 [P.O.:360; IV Piggyback:165.4] Out: 800 [Urine:800] Intake/Output this shift: No intake/output data recorded.  General appearance: alert and cooperative Resp: clear to auscultation bilaterally Cardio: regular rate and rhythm GI: soft, non-tender; bowel sounds normal; no masses,  no organomegaly Skin: open wound left groin clean with moderate drainage  Lab Results:  Recent Labs    09/01/18 0340 09/02/18 0443  WBC 20.1* 19.8*  HGB 10.4* 10.0*  HCT 33.1* 31.7*  PLT 145* 177   BMET Recent Labs    09/01/18 0340 09/02/18 0443  NA 139 140  K 4.8 5.3*  CL 108 111  CO2 22 23  GLUCOSE 148* 92  BUN 18 18  CREATININE 1.75* 1.60*  CALCIUM 8.2* 8.4*   PT/INR No results for input(s): LABPROT, INR in the last 72 hours. ABG No results for input(s): PHART, HCO3 in the last 72 hours.  Invalid input(s): PCO2, PO2  Studies/Results: Dg Chest 2 View  Result Date: 09/01/2018 CLINICAL DATA:  Shortness of breath.  Leukocytosis. EXAM: CHEST - 2 VIEW COMPARISON:  Chest CT 09/03/2016 FINDINGS: Cardiomediastinal silhouette is normal. Mediastinal contours appear intact. There is no evidence of focal airspace consolidation, pleural effusion or pneumothorax. Osseous structures are without acute abnormality. Soft tissues are grossly normal. IMPRESSION: No active cardiopulmonary disease. Electronically Signed   By: Fidela Salisbury M.D.   On: 09/01/2018 11:12    Anti-infectives: Anti-infectives (From admission, onward)   Start     Dose/Rate Route Frequency  Ordered Stop   08/30/18 2000  doxycycline (VIBRAMYCIN) 100 mg in sodium chloride 0.9 % 250 mL IVPB  Status:  Discontinued     100 mg 125 mL/hr over 120 Minutes Intravenous Every 12 hours 08/30/18 1944 09/01/18 1600   08/30/18 1000  piperacillin-tazobactam (ZOSYN) IVPB 3.375 g     3.375 g 12.5 mL/hr over 240 Minutes Intravenous Every 8 hours 08/30/18 0928     08/28/18 1100  vancomycin (VANCOCIN) IVPB 750 mg/150 ml premix  Status:  Discontinued     750 mg 150 mL/hr over 60 Minutes Intravenous Every 24 hours 08/27/18 1225 08/28/18 1039   08/28/18 1045  vancomycin variable dose per unstable renal function (pharmacist dosing)  Status:  Discontinued      Does not apply See admin instructions 08/28/18 1045 08/28/18 1331   08/27/18 1800  cefTRIAXone (ROCEPHIN) 2 g in sodium chloride 0.9 % 100 mL IVPB  Status:  Discontinued     2 g 200 mL/hr over 30 Minutes Intravenous Every 24 hours 08/27/18 0759 08/30/18 0923   08/27/18 0900  vancomycin (VANCOCIN) 2,000 mg in sodium chloride 0.9 % 500 mL IVPB     2,000 mg 250 mL/hr over 120 Minutes Intravenous  Once 08/27/18 0811 08/27/18 1111   08/27/18 0515  cefTRIAXone (ROCEPHIN) 1 g in sodium chloride 0.9 % 100 mL IVPB     1 g 200 mL/hr over 30 Minutes Intravenous  Once 08/27/18 0502 08/27/18 0603   08/27/18 0500  vancomycin (VANCOCIN) IVPB 1000 mg/200 mL premix  Status:  Discontinued     1,000  mg 200 mL/hr over 60 Minutes Intravenous  Once 08/27/18 0446 08/27/18 0810      Assessment/Plan: s/p Procedure(s): IRRIGATION AND DEBRIDEMENT ABSCESS THIGH/GROIN LEFT (Left) continue abx and dressing changes  Pt requests air bed Hypertension Acute kidney injury1.97>>2.40>>2.19 today Left adrenal mass Morbid obesity - BMI 50.8 WBC 16.3>>14.8>>11.6>>13.8>>20.1 today   Left groin abscess/cellulitis I&D left groin abscess, 08/28/18 INCISION AND DRAINAGEABSCESS THIGH/GROIN LEFT, 08/31/18, Dr. Ninfa Linden   FEN: Heart healthy/carb mod [id number]  2/16-17/20,Rocephin 2/16 -2/18; doxycycline 2/19 >>day 3; Zosyn 2/19>> day 3 DVT: Lovenox Follow up: TBD   Plan:  Dressing changes with IV pain meds, Will look at it tomorrow and Keep her NPO after MN, just in case she needs to be taken back to OR.  Continue antibiotics.  LOS: 6 days    Caitlin Taylor 09/02/2018

## 2018-09-02 NOTE — Progress Notes (Signed)
CRITICAL VALUE ALERT  Critical Value:  Blood culture - neg Gram stain,  ecoli  Date & Time Notied:  09/02/18 , 1700  Provider Notified: Irine Seal , MD  Orders Received/Actions taken: YES

## 2018-09-02 NOTE — Progress Notes (Addendum)
PROGRESS NOTE    Caitlin Taylor  SWF:[id number] DOB: 16-Dec-1970 DOA: 08/27/2018 PCP: [name], MD    Brief Narrative:  Caitlin Taylor is a 48 y.o.femalewith medical history significant ofDVT and PE, hypertension, chronic kidney disease, obesity presenting with left groin pain and cellulitis.     Assessment & Plan:   Principal Problem:   Abscess Active Problems:   Essential hypertension   Morbid obesity due to excess calories (HCC)   CKD (chronic kidney disease), stage III (HCC)   History of pulmonary embolus (PE)   Cellulitis of left groin   Acute renal failure superimposed on stage 3 chronic kidney disease ([place])  1 left groin abscess/cellulitis Patient with no significant improvement with left groin abscess and cellulitis still with significant pain, mons pubis is swollen, left upper thigh with ongoing cellulitis and pain to palpation as of 08/30/2018.  Patient status post bedside I&D left groin abscess 08/28/2018 with cultures growing E. coli.  Abscess site with some purulent drainage.  IV antibiotics have been changed to IV Zosyn per general surgery.  Patient being followed by general surgery and patient underwent incision and drainage of left thigh abscess and left groin abscess with cultures sent off on 08/31/2018..  Initial cultures from bedside incision and drainage with E. coli and Streptococcus group G.  Cultures from I&D done on 08/31/2018 with preliminary findings with few E. coli.  Due to extensive nature of patient's cellulitis left thigh abscess and groin abscess ID was consulted for antibiotic recommendations and duration.  Continue IV Zosyn for now.  Per ID if patient continues to improve and not needing any further surgery would consider conversion to oral therapy with Bactrim and Augmentin with a plan of a total course of 10 to 14 days.  Patient being followed by general surgery and patient may need to be taken back to the OR tomorrow per general surgery.  General  surgery following and appreciate input and recommendations.  2.  Acute kidney injury on chronic kidney disease stage III Questionable etiology.  Creatinine was rising and as such IV vancomycin discontinued.  Creatinine went up as high as 2.40.  Creatinine trending down and currently at currently at 1.60 from 1.75 from 1.75 from 1.86.  Blood pressure was borderline however has improved with hydration.  IV fluids have been saline locked.  Follow.   3.  Hypertension Blood pressure was borderline however improved.  IV fluids have been saline locked.  Spironolactone and torsemide on hold.  Continue metoprolol.    4.  Left adrenal mass Incidentally noted on CT.  Will need outpatient follow-up MRI.  5.  Leukocytosis Patient noted with a worsening leukocytosis on 09/01/2018 with a white count up to 20 from 13.8.  May be secondary to problem #1.  Chest x-ray negative for any acute infiltrate.  Urinalysis nitrite -0-5 WBCs.  Urine cultures pending.  Blood cultures repeated and are pending.  Continue empiric IV Zosyn for now.  ID following.  6.  Hyperkalemia Repeat potassium levels.   DVT prophylaxis: Lovenox Code Status: Full Family Communication: Updated patient.  No family at bedside. Disposition Plan: Home when clinically improved and medically stable and per general surgery.   Consultants:   General surgery: Dr. Ninfa Linden 08/28/2018  Infectious disease: Dr. Megan Salon 09/01/2018  Procedures:   CT abdomen and pelvis 08/27/2018  Bedside I&D 08/28/2018 per general surgery--Kelly Rayburn, PA general surgery  Incision and drainage abscess left thigh and left groin per Dr. Ninfa Linden 08/31/2018   Chest  x-ray 09/01/2018  Antimicrobials:   IV Zosyn  08/30/2018  IV Rocephin 08/27/2018>>> 08/30/2018  IV vancomycin 08/27/2018>>>> 08/28/2018   Subjective: Patient in bed.  Still with some pain with dressing changes.  Denies any chest pain or shortness of breath.  Objective: Vitals:   09/01/18 0614  09/01/18 1351 09/01/18 2107 09/02/18 0510  BP: (!) 139/92 111/72 132/77 (!) 142/81  Pulse: 84 94 86 87  Resp: 20 16 18 17   Temp: 98.8 F (37.1 C) 98.2 F (36.8 C) 97.9 F (36.6 C) 98.7 F (37.1 C)  TempSrc: Oral Oral Oral Oral  SpO2: 96% 99% 100% 96%  Weight: 135.2 kg     Height:        Intake/Output Summary (Last 24 hours) at 09/02/2018 1216 Last data filed at 09/02/2018 0700 Gross per 24 hour  Intake 525.43 ml  Output 800 ml  Net -274.57 ml   Filed Weights   08/31/18 0500 08/31/18 0930 09/01/18 0614  Weight: 130.8 kg 130.8 kg 135.2 kg    Examination:  General exam: NAD Respiratory system: Lungs clear to auscultation bilaterally.  No wheezes, no crackles, no rhonchi.  No use of accessory muscles of respiration.  Speaking in full sentences. Cardiovascular system: Regular rate rhythm no murmurs rubs or gallops.  No JVD.  No lower extremity edema.  Gastrointestinal system: Abdomen is distended, soft, nontender to palpation, positive bowel sounds.  No rebound.  No guarding.  Central nervous system: Alert and oriented. No focal neurological deficits. Extremities: Symmetric 5 x 5 power. Genitourinary: Mons pubis swollen, some tender to palpation with some induration, left groin with packing, left upper thigh erythema improving with less swelling however still significantly tender to palpation. Skin: No rashes, lesions or ulcers Psychiatry: Judgement and insight appear normal. Mood & affect appropriate.     Data Reviewed: I have personally reviewed following labs and imaging studies  CBC: Recent Labs  Lab 08/27/18 0305  08/28/18 0357 08/29/18 0343 08/31/18 0350 09/01/18 0340 09/02/18 0443  WBC 16.3*   < > 14.8* 11.6* 13.8* 20.1* 19.8*  NEUTROABS 12.6*  --   --   --  9.7* 17.1* 15.8*  HGB 12.1   < > 10.5* 10.8* 10.4* 10.4* 10.0*  HCT 37.8   < > 32.7* 33.3* 32.6* 33.1* 31.7*  MCV 94.3   < > 94.2 96.0 96.2 97.4 96.9  PLT 150   < > 113* 124* 127* 145* 177   < > = values  in this interval not displayed.   Basic Metabolic Panel: Recent Labs  Lab 08/29/18 0343 08/30/18 0351 08/31/18 0350 09/01/18 0340 09/02/18 0443 09/02/18 0943  NA 136 138 138 139 140  --   K 3.7 4.2 4.5 4.8 5.3* 4.4  CL 100 103 105 108 111  --   CO2 25 24 25 22 23   --   GLUCOSE 88 124* 124* 148* 92  --   BUN 35* 26* 19 18 18   --   CREATININE 2.19* 1.86* 1.75* 1.75* 1.60*  --   CALCIUM 8.6* 8.6* 8.2* 8.2* 8.4*  --   MG  --   --  2.3  --   --   --    GFR: Estimated Creatinine Clearance: 58.7 mL/min (A) (by C-G formula based on SCr of 1.6 mg/dL (H)). Liver Function Tests: Recent Labs  Lab 08/27/18 0832 08/28/18 0357  AST  --  20  ALT  --  38  ALKPHOS  --  81  BILITOT  --  0.9  PROT  --  5.9*  ALBUMIN 3.5 2.9*   No results for input(s): LIPASE, AMYLASE in the last 168 hours. No results for input(s): AMMONIA in the last 168 hours. Coagulation Profile: No results for input(s): INR, PROTIME in the last 168 hours. Cardiac Enzymes: No results for input(s): CKTOTAL, CKMB, CKMBINDEX, TROPONINI in the last 168 hours. BNP (last 3 results) No results for input(s): PROBNP in the last 8760 hours. HbA1C: No results for input(s): HGBA1C in the last 72 hours. CBG: No results for input(s): GLUCAP in the last 168 hours. Lipid Profile: No results for input(s): CHOL, HDL, LDLCALC, TRIG, CHOLHDL, LDLDIRECT in the last 72 hours. Thyroid Function Tests: No results for input(s): TSH, T4TOTAL, FREET4, T3FREE, THYROIDAB in the last 72 hours. Anemia Panel: No results for input(s): VITAMINB12, FOLATE, FERRITIN, TIBC, IRON, RETICCTPCT in the last 72 hours. Sepsis Labs: No results for input(s): PROCALCITON, LATICACIDVEN in the last 168 hours.  Recent Results (from the past 240 hour(s))  Culture, blood (single) w Reflex to ID Panel     Status: None   Collection Time: 08/27/18  5:30 AM  Result Value Ref Range Status   Specimen Description BLOOD RIGHT ANTECUBITAL  Final   Special Requests    Final    BOTTLES DRAWN AEROBIC AND ANAEROBIC Blood Culture adequate volume Performed at [facility], [address] [place], [place] [address]    Culture NO GROWTH 5 DAYS  Final   Report Status 09/01/2018 FINAL  Final  Aerobic Culture (superficial specimen)     Status: None   Collection Time: 08/28/18  1:23 PM  Result Value Ref Range Status   Specimen Description   Final    GROIN LEFT Performed at [facility], [place] [address], [place], [place] [address]    Special Requests   Final    NONE Performed at [facility], [place] [address] [place], [place] [address]    Gram Stain   Final    FEW WBC PRESENT,BOTH PMN AND MONONUCLEAR FEW GRAM POSITIVE COCCI RARE GRAM VARIABLE ROD Performed at [facility], [place] [address] [place], [place] [address]    Culture FEW ESCHERICHIA COLI FEW STREPTOCOCCUS GROUP G   Final   Report Status 08/30/2018 FINAL  Final   Organism ID, Bacteria ESCHERICHIA COLI  Final      Susceptibility   Escherichia coli - MIC*    AMPICILLIN >=32 RESISTANT Resistant     CEFAZOLIN 8 SENSITIVE Sensitive     CEFEPIME <=1 SENSITIVE Sensitive     CEFTAZIDIME <=1 SENSITIVE Sensitive     CEFTRIAXONE <=1 SENSITIVE Sensitive     CIPROFLOXACIN <=0.25 SENSITIVE Sensitive     GENTAMICIN <=1 SENSITIVE Sensitive     IMIPENEM <=0.25 SENSITIVE Sensitive     TRIMETH/SULFA <=20 SENSITIVE Sensitive     AMPICILLIN/SULBACTAM >=32 RESISTANT Resistant     PIP/TAZO <=4 SENSITIVE Sensitive     Extended ESBL NEGATIVE Sensitive     * FEW ESCHERICHIA COLI  Aerobic/Anaerobic Culture (surgical/deep wound)     Status: None (Preliminary result)   Collection Time: 08/31/18 10:20 AM  Result Value Ref Range Status   Specimen Description   Final    SFT TISS OTH Performed at [place] [address], [place], [place] [address]    Special Requests   Final    NONE Performed at [facility], [place] [address],  [place], [place] [address]    Gram Stain  Final    FEW WBC PRESENT, PREDOMINANTLY PMN NO ORGANISMS SEEN Performed at [place] [address], [place], [place] [address]    Culture FEW ESCHERICHIA COLI  Final   Report Status PENDING  Incomplete  Culture, blood (Routine X 2) w Reflex to ID Panel     Status: None (Preliminary result)   Collection Time: 09/01/18  9:30 AM  Result Value Ref Range Status   Specimen Description BLOOD LEFT HAND  Final   Special Requests   Final    BOTTLES DRAWN AEROBIC ONLY Blood Culture results may not be optimal due to an inadequate volume of blood received in culture bottles Performed at [facility], [place] [address] [place], [place] [address]    Culture NO GROWTH < 12 HOURS  Final   Report Status PENDING  Incomplete  Culture, blood (Routine X 2) w Reflex to ID Panel     Status: None (Preliminary result)   Collection Time: 09/01/18  9:32 AM  Result Value Ref Range Status   Specimen Description BLOOD RIGHT HAND  Final   Special Requests   Final    BOTTLES DRAWN AEROBIC ONLY Blood Culture adequate volume Performed at [place] [address] [place], [place] [address]    Culture NO GROWTH < 12 HOURS  Final   Report Status PENDING  Incomplete         Radiology Studies: Dg Chest 2 View  Result Date: 09/01/2018 CLINICAL DATA:  Shortness of breath.  Leukocytosis. EXAM: CHEST - 2 VIEW COMPARISON:  Chest CT 09/03/2016 FINDINGS: Cardiomediastinal silhouette is normal. Mediastinal contours appear intact. There is no evidence of focal airspace consolidation, pleural effusion or pneumothorax. Osseous structures are without acute abnormality. Soft tissues are grossly normal. IMPRESSION: No active cardiopulmonary disease. Electronically Signed   By: Fidela Salisbury M.D.   On: 09/01/2018 11:12        Scheduled Meds: . allopurinol  100 mg Oral Daily  . colchicine  0.6 mg Oral BID  . enoxaparin (LOVENOX)  injection  40 mg Subcutaneous Q24H  . Influenza vac split quadrivalent PF  0.5 mL Intramuscular Tomorrow-1000  . metoprolol tartrate  12.5 mg Oral BID  . norethindrone  1 tablet Oral QHS  . polyethylene glycol  17 g Oral Daily   Continuous Infusions: . sodium chloride Stopped (08/31/18 0229)  . piperacillin-tazobactam (ZOSYN)  IV 3.375 g (09/02/18 0945)     LOS: 6 days    Time spent: 35 minutes    Irine Seal, MD [facility]  If 7PM-7AM, please contact night-coverage [email or web address] 09/02/2018, 12:16 PM

## 2018-09-03 LAB — CBC WITH DIFFERENTIAL/PLATELET
Abs Immature Granulocytes: 1.59 10*3/uL — ABNORMAL HIGH (ref 0.00–0.07)
BASOS PCT: 0 %
Basophils Absolute: 0.1 10*3/uL (ref 0.0–0.1)
Eosinophils Absolute: 0 10*3/uL (ref 0.0–0.5)
Eosinophils Relative: 0 %
HCT: 32.4 % — ABNORMAL LOW (ref 36.0–46.0)
Hemoglobin: 10.3 g/dL — ABNORMAL LOW (ref 12.0–15.0)
Immature Granulocytes: 9 %
Lymphocytes Relative: 6 %
Lymphs Abs: 1.1 10*3/uL (ref 0.7–4.0)
MCH: 30.6 pg (ref 26.0–34.0)
MCHC: 31.8 g/dL (ref 30.0–36.0)
MCV: 96.1 fL (ref 80.0–100.0)
Monocytes Absolute: 0.6 10*3/uL (ref 0.1–1.0)
Monocytes Relative: 3 %
Neutro Abs: 14.9 10*3/uL — ABNORMAL HIGH (ref 1.7–7.7)
Neutrophils Relative %: 82 %
Platelets: 187 10*3/uL (ref 150–400)
RBC: 3.37 MIL/uL — ABNORMAL LOW (ref 3.87–5.11)
RDW: 14.6 % (ref 11.5–15.5)
WBC: 18.3 10*3/uL — ABNORMAL HIGH (ref 4.0–10.5)
nRBC: 0 % (ref 0.0–0.2)

## 2018-09-03 LAB — URINE CULTURE: Culture: 20000 — AB

## 2018-09-03 LAB — BASIC METABOLIC PANEL
Anion gap: 8 (ref 5–15)
BUN: 16 mg/dL (ref 6–20)
CALCIUM: 8.3 mg/dL — AB (ref 8.9–10.3)
CO2: 22 mmol/L (ref 22–32)
Chloride: 110 mmol/L (ref 98–111)
Creatinine, Ser: 1.34 mg/dL — ABNORMAL HIGH (ref 0.44–1.00)
GFR calc Af Amer: 55 mL/min — ABNORMAL LOW (ref >60)
GFR calc non Af Amer: 47 mL/min — ABNORMAL LOW (ref >60)
Glucose, Bld: 96 mg/dL (ref 70–99)
Potassium: 4.7 mmol/L (ref 3.5–5.1)
Sodium: 140 mmol/L (ref 135–145)

## 2018-09-03 MED ORDER — TORSEMIDE 20 MG PO TABS
20.0000 mg | ORAL_TABLET | Freq: Every day | ORAL | Status: DC
  Administered 2018-09-03 – 2018-09-07 (×5): 20 mg via ORAL
  Filled 2018-09-03 (×6): qty 1

## 2018-09-03 MED ORDER — SPIRONOLACTONE 25 MG PO TABS
25.0000 mg | ORAL_TABLET | Freq: Every day | ORAL | Status: DC
  Administered 2018-09-03 – 2018-09-06 (×4): 25 mg via ORAL
  Filled 2018-09-03 (×4): qty 1

## 2018-09-03 NOTE — Progress Notes (Addendum)
3 Days Post-Op   Subjective/Chief Complaint: Still complains of pain in left groin   Objective: Vital signs in last 24 hours: Temp:  [97.6 F (36.4 C)-99.4 F (37.4 C)] 98.8 F (37.1 C) (02/23 0611) Pulse Rate:  [80-91] 86 (02/23 0611) Resp:  [16-18] 17 (02/23 0611) BP: (114-138)/(76-92) 138/92 (02/23 0611) SpO2:  [98 %-100 %] 98 % (02/23 0611) Last BM Date: 08/31/18  Intake/Output from previous day: 02/22 0701 - 02/23 0700 In: 600 [P.O.:600] Out: -  Intake/Output this shift: No intake/output data recorded.  General appearance: alert and cooperative Resp: clear to auscultation bilaterally Cardio: regular rate and rhythm GI: soft, non-tender; bowel sounds normal; no masses,  no organomegaly Skin: open wound left groin clean. less drainage and lateral skin and hip area seem softer and less red  Lab Results:  Recent Labs    09/02/18 0443 09/03/18 0515  WBC 19.8* 18.3*  HGB 10.0* 10.3*  HCT 31.7* 32.4*  PLT 177 187   BMET Recent Labs    09/02/18 0443 09/02/18 0943 09/03/18 0515  NA 140  --  140  K 5.3* 4.4 4.7  CL 111  --  110  CO2 23  --  22  GLUCOSE 92  --  96  BUN 18  --  16  CREATININE 1.60*  --  1.34*  CALCIUM 8.4*  --  8.3*   PT/INR No results for input(s): LABPROT, INR in the last 72 hours. ABG No results for input(s): PHART, HCO3 in the last 72 hours.  Invalid input(s): PCO2, PO2  Studies/Results: Dg Chest 2 View  Result Date: 09/01/2018 CLINICAL DATA:  Shortness of breath.  Leukocytosis. EXAM: CHEST - 2 VIEW COMPARISON:  Chest CT 09/03/2016 FINDINGS: Cardiomediastinal silhouette is normal. Mediastinal contours appear intact. There is no evidence of focal airspace consolidation, pleural effusion or pneumothorax. Osseous structures are without acute abnormality. Soft tissues are grossly normal. IMPRESSION: No active cardiopulmonary disease. Electronically Signed   By: Fidela Salisbury M.D.   On: 09/01/2018 11:12     Anti-infectives: Anti-infectives (From admission, onward)   Start     Dose/Rate Route Frequency Ordered Stop   08/30/18 2000  doxycycline (VIBRAMYCIN) 100 mg in sodium chloride 0.9 % 250 mL IVPB  Status:  Discontinued     100 mg 125 mL/hr over 120 Minutes Intravenous Every 12 hours 08/30/18 1944 09/01/18 1600   08/30/18 1000  piperacillin-tazobactam (ZOSYN) IVPB 3.375 g     3.375 g 12.5 mL/hr over 240 Minutes Intravenous Every 8 hours 08/30/18 0928     08/28/18 1100  vancomycin (VANCOCIN) IVPB 750 mg/150 ml premix  Status:  Discontinued     750 mg 150 mL/hr over 60 Minutes Intravenous Every 24 hours 08/27/18 1225 08/28/18 1039   08/28/18 1045  vancomycin variable dose per unstable renal function (pharmacist dosing)  Status:  Discontinued      Does not apply See admin instructions 08/28/18 1045 08/28/18 1331   08/27/18 1800  cefTRIAXone (ROCEPHIN) 2 g in sodium chloride 0.9 % 100 mL IVPB  Status:  Discontinued     2 g 200 mL/hr over 30 Minutes Intravenous Every 24 hours 08/27/18 0759 08/30/18 0923   08/27/18 0900  vancomycin (VANCOCIN) 2,000 mg in sodium chloride 0.9 % 500 mL IVPB     2,000 mg 250 mL/hr over 120 Minutes Intravenous  Once 08/27/18 0811 08/27/18 1111   08/27/18 0515  cefTRIAXone (ROCEPHIN) 1 g in sodium chloride 0.9 % 100 mL IVPB     1  g 200 mL/hr over 30 Minutes Intravenous  Once 08/27/18 0502 08/27/18 0603   08/27/18 0500  vancomycin (VANCOCIN) IVPB 1000 mg/200 mL premix  Status:  Discontinued     1,000 mg 200 mL/hr over 60 Minutes Intravenous  Once 08/27/18 0446 08/27/18 0810      Assessment/Plan: s/p Procedure(s): IRRIGATION AND DEBRIDEMENT ABSCESS THIGH/GROIN LEFT (Left) Advance diet  Continue IV abx  continue abx and dressing changes  Pt requests air bed Hypertension Acute kidney injury1.7>>1.6>>1.3 today. Improving Left adrenal mass Morbid obesity - BMI 50.8 WBC 20>>19>>18   Left groin abscess/cellulitis I&D left groin abscess,  08/28/18 INCISION AND DRAINAGEABSCESS THIGH/GROIN LEFT, 08/31/18, Dr. Ninfa Linden   FEN: Heart healthy/carb mod MV:[id number] 2/16-17/20,Rocephin 2/16-2/18; doxycycline 2/19 >>day 3; Zosyn 2/19>> day 3 DVT: Lovenox Follow up:TBD   Plan: Dressing changes with IV pain meds, Will look at it tomorrow and Keep her NPO after MN, just in case she needs to be taken back to OR. Continue antibiotics.  LOS: 7 days    Caitlin Taylor 09/03/2018

## 2018-09-03 NOTE — Progress Notes (Addendum)
PROGRESS NOTE    Caitlin Taylor  BPJ:[id number] DOB: 06-Apr-1971 DOA: 08/27/2018 PCP: [name], MD    Brief Narrative:  Caitlin Taylor is a 48 y.o.femalewith medical history significant ofDVT and PE, hypertension, chronic kidney disease, obesity presenting with left groin pain and cellulitis.     Assessment & Plan:   Principal Problem:   Abscess Active Problems:   Essential hypertension   Morbid obesity due to excess calories (HCC)   CKD (chronic kidney disease), stage III (HCC)   History of pulmonary embolus (PE)   Cellulitis of left groin   Acute renal failure superimposed on stage 3 chronic kidney disease ([place])  1 left groin abscess/cellulitis Patient with no significant improvement with left groin abscess and cellulitis still with significant pain, mons pubis is swollen, left upper thigh with ongoing cellulitis and pain to palpation as of 08/30/2018.  Patient status post bedside I&D left groin abscess 08/28/2018 with cultures growing E. coli.  Abscess site with some purulent drainage.  IV antibiotics have been changed to IV Zosyn per general surgery.  Patient being followed by general surgery and patient underwent incision and drainage of left thigh abscess and left groin abscess with cultures sent off on 08/31/2018..  Initial cultures from bedside incision and drainage with E. coli and Streptococcus group G.  Cultures from I&D done on 08/31/2018 with preliminary findings with few E. coli.  Due to extensive nature of patient's cellulitis left thigh abscess and groin abscess ID was consulted for antibiotic recommendations and duration.  Continue IV Zosyn for now.  Per ID if patient continues to improve and not needing any further surgery would consider conversion to oral therapy with Bactrim and Augmentin with a plan of a total course of 10 to 14 days.  Patient being followed by general surgery and patient may need to be taken back to the OR tomorrow per general surgery.  General  surgery following and appreciate input and recommendations.  2.  Acute kidney injury on chronic kidney disease stage III Questionable etiology.  Creatinine was rising and as such IV vancomycin discontinued.  Creatinine went up as high as 2.40.  Creatinine trending down and currently at currently at 3 4 from 1.60 from 1.75 from 1.75 from 1.86.  Blood pressure was borderline however has improved with hydration.  IV fluids have been saline locked.  Follow.   3.  Hypertension Blood pressure was borderline however improved.  IV fluids have been saline locked.  Spironolactone and torsemide on hold.  Continue metoprolol.  Resume home regimen spironolactone and torsemide.  4.  Left adrenal mass Incidentally noted on CT.  Will need outpatient follow-up MRI.  5.  Leukocytosis Patient noted with a worsening leukocytosis on 09/01/2018 with a white count up to 20 from 13.8.  May be secondary to problem #1.  WBC trending down currently at 18.3.  Chest x-ray negative for any acute infiltrate.  Urinalysis nitrite -0-5 WBCs.  Urine cultures with 20,000 colonies of Enterobacter aerogenes which is not significant.  Patient denies any urinary symptoms.  Blood cultures repeated and are pending.  Continue empiric IV Zosyn for now.   6.  Hyperkalemia Resolved.    DVT prophylaxis: Lovenox Code Status: Full Family Communication: Updated patient.  No family at bedside. Disposition Plan: Home when clinically improved and medically stable and per general surgery.   Consultants:   General surgery: Dr. Ninfa Linden 08/28/2018  Infectious disease: Dr. Megan Salon 09/01/2018  Procedures:   CT abdomen and pelvis 08/27/2018  Bedside I&D 08/28/2018 per general surgery--Kelly Rayburn, PA general surgery  Incision and drainage abscess left thigh and left groin per Dr. Ninfa Linden 08/31/2018   Chest x-ray 09/01/2018  Antimicrobials:   IV Zosyn  08/30/2018  IV Rocephin 08/27/2018>>> 08/30/2018  IV vancomycin 08/27/2018>>>>  08/28/2018   Subjective: Patient in bed.  Denies any chest pain or shortness of breath.  States she is feeling a little bit better today.  Still with some pain in the left groin and left upper thigh.  Tolerating current diet.    Objective: Vitals:   09/02/18 0510 09/02/18 1406 09/02/18 2119 09/03/18 0611  BP: (!) 142/81 130/76 114/77 (!) 138/92  Pulse: 87 80 91 86  Resp: 17 16 18 17   Temp: 98.7 F (37.1 C) 97.6 F (36.4 C) 99.4 F (37.4 C) 98.8 F (37.1 C)  TempSrc: Oral Oral Oral Oral  SpO2: 96% 99% 100% 98%  Weight:      Height:        Intake/Output Summary (Last 24 hours) at 09/03/2018 1237 Last data filed at 09/03/2018 0940 Gross per 24 hour  Intake 250 ml  Output -  Net 250 ml   Filed Weights   08/31/18 0500 08/31/18 0930 09/01/18 0614  Weight: 130.8 kg 130.8 kg 135.2 kg    Examination:  General exam: NAD. Respiratory system: CTA B.  No wheezes, no crackles, no rhonchi.  Speaking in full sentences.  No use of accessory muscles of respiration.   Cardiovascular system: RRR no murmurs rubs or gallops.  No JVD.  No lower extremity pitting edema.  Gastrointestinal system: Abdomen is soft, less distended, nontender to palpation, positive bowel sounds.  No rebound.  No guarding.   Central nervous system: Alert and oriented. No focal neurological deficits. Extremities: Symmetric 5 x 5 power. Genitourinary: Mons pubis less swollen, some tender to palpation with some induration, left groin with packing, left upper thigh less erythematous, less tender to palpation, less swelling.  Open wound of the left groin clean, less drainage and slight necrotic skin at the edges.  Skin: No rashes, lesions or ulcers Psychiatry: Judgement and insight appear normal. Mood & affect appropriate.     Data Reviewed: I have personally reviewed following labs and imaging studies  CBC: Recent Labs  Lab 08/29/18 0343 08/31/18 0350 09/01/18 0340 09/02/18 0443 09/03/18 0515  WBC 11.6* 13.8*  20.1* 19.8* 18.3*  NEUTROABS  --  9.7* 17.1* 15.8* 14.9*  HGB 10.8* 10.4* 10.4* 10.0* 10.3*  HCT 33.3* 32.6* 33.1* 31.7* 32.4*  MCV 96.0 96.2 97.4 96.9 96.1  PLT 124* 127* 145* 177 010   Basic Metabolic Panel: Recent Labs  Lab 08/30/18 0351 08/31/18 0350 09/01/18 0340 09/02/18 0443 09/02/18 0943 09/03/18 0515  NA 138 138 139 140  --  140  K 4.2 4.5 4.8 5.3* 4.4 4.7  CL 103 105 108 111  --  110  CO2 24 25 22 23   --  22  GLUCOSE 124* 124* 148* 92  --  96  BUN 26* 19 18 18   --  16  CREATININE 1.86* 1.75* 1.75* 1.60*  --  1.34*  CALCIUM 8.6* 8.2* 8.2* 8.4*  --  8.3*  MG  --  2.3  --   --   --   --    GFR: Estimated Creatinine Clearance: 70.1 mL/min (A) (by C-G formula based on SCr of 1.34 mg/dL (H)). Liver Function Tests: Recent Labs  Lab 08/28/18 0357  AST 20  ALT 38  ALKPHOS 81  BILITOT 0.9  PROT 5.9*  ALBUMIN 2.9*   No results for input(s): LIPASE, AMYLASE in the last 168 hours. No results for input(s): AMMONIA in the last 168 hours. Coagulation Profile: No results for input(s): INR, PROTIME in the last 168 hours. Cardiac Enzymes: No results for input(s): CKTOTAL, CKMB, CKMBINDEX, TROPONINI in the last 168 hours. BNP (last 3 results) No results for input(s): PROBNP in the last 8760 hours. HbA1C: No results for input(s): HGBA1C in the last 72 hours. CBG: No results for input(s): GLUCAP in the last 168 hours. Lipid Profile: No results for input(s): CHOL, HDL, LDLCALC, TRIG, CHOLHDL, LDLDIRECT in the last 72 hours. Thyroid Function Tests: No results for input(s): TSH, T4TOTAL, FREET4, T3FREE, THYROIDAB in the last 72 hours. Anemia Panel: No results for input(s): VITAMINB12, FOLATE, FERRITIN, TIBC, IRON, RETICCTPCT in the last 72 hours. Sepsis Labs: No results for input(s): PROCALCITON, LATICACIDVEN in the last 168 hours.  Recent Results (from the past 240 hour(s))  Culture, blood (single) w Reflex to ID Panel     Status: None   Collection Time: 08/27/18  5:30  AM  Result Value Ref Range Status   Specimen Description BLOOD RIGHT ANTECUBITAL  Final   Special Requests   Final    BOTTLES DRAWN AEROBIC AND ANAEROBIC Blood Culture adequate volume Performed at [facility], [place] [place], [place] [address]    Culture NO GROWTH 5 DAYS  Final   Report Status 09/01/2018 FINAL  Final  Aerobic Culture (superficial specimen)     Status: None   Collection Time: 08/28/18  1:23 PM  Result Value Ref Range Status   Specimen Description   Final    GROIN LEFT Performed at [facility], [place] [address], [place], [place] [address]    Special Requests   Final    NONE Performed at [facility], [place] [name] [place], [place] [address]    Gram Stain   Final    FEW WBC PRESENT,BOTH PMN AND MONONUCLEAR FEW GRAM POSITIVE COCCI RARE GRAM VARIABLE ROD Performed at [facility], [place] [address], [place], [place] [address]    Culture FEW ESCHERICHIA COLI FEW STREPTOCOCCUS GROUP G   Final   Report Status 08/30/2018 FINAL  Final   Organism ID, Bacteria ESCHERICHIA COLI  Final      Susceptibility   Escherichia coli - MIC*    AMPICILLIN >=32 RESISTANT Resistant     CEFAZOLIN 8 SENSITIVE Sensitive     CEFEPIME <=1 SENSITIVE Sensitive     CEFTAZIDIME <=1 SENSITIVE Sensitive     CEFTRIAXONE <=1 SENSITIVE Sensitive     CIPROFLOXACIN <=0.25 SENSITIVE Sensitive     GENTAMICIN <=1 SENSITIVE Sensitive     IMIPENEM <=0.25 SENSITIVE Sensitive     TRIMETH/SULFA <=20 SENSITIVE Sensitive     AMPICILLIN/SULBACTAM >=32 RESISTANT Resistant     PIP/TAZO <=4 SENSITIVE Sensitive     Extended ESBL NEGATIVE Sensitive     * FEW ESCHERICHIA COLI  Aerobic/Anaerobic Culture (surgical/deep wound)     Status: None (Preliminary result)   Collection Time: 08/31/18 10:20 AM  Result Value Ref Range Status   Specimen Description   Final    SFT TISS OTH Performed at [place] [address], [place], [place]  [address]    Special Requests   Final    NONE Performed at [facility], [place] [address] [place], [place] [address]    Gram Stain  Final    FEW WBC PRESENT, PREDOMINANTLY PMN NO ORGANISMS SEEN    Culture   Final    FEW ESCHERICHIA COLI CRITICAL RESULT CALLED TO, READ BACK BY AND VERIFIED WITH: C. HUGHEY, RN ([facility]) CONCERNING CULTURE GROWTH AT 1750 ON 09/02/18 BY C. JESSUP, MLT. Performed at [facility], [place] [address] [place], [place] [address]    Report Status PENDING  Incomplete   Organism ID, Bacteria ESCHERICHIA COLI  Final      Susceptibility   Escherichia coli - MIC*    AMPICILLIN >=32 RESISTANT Resistant     CEFAZOLIN 8 SENSITIVE Sensitive     CEFEPIME <=1 SENSITIVE Sensitive     CEFTAZIDIME <=1 SENSITIVE Sensitive     CEFTRIAXONE <=1 SENSITIVE Sensitive     CIPROFLOXACIN <=0.25 SENSITIVE Sensitive     GENTAMICIN <=1 SENSITIVE Sensitive     IMIPENEM <=0.25 SENSITIVE Sensitive     TRIMETH/SULFA <=20 SENSITIVE Sensitive     AMPICILLIN/SULBACTAM >=32 RESISTANT Resistant     PIP/TAZO 8 SENSITIVE Sensitive     Extended ESBL NEGATIVE Sensitive     * FEW ESCHERICHIA COLI  Culture, blood (Routine X 2) w Reflex to ID Panel     Status: None (Preliminary result)   Collection Time: 09/01/18  9:30 AM  Result Value Ref Range Status   Specimen Description BLOOD LEFT HAND  Final   Special Requests   Final    BOTTLES DRAWN AEROBIC ONLY Blood Culture results may not be optimal due to an inadequate volume of blood received in culture bottles Performed at [place] [address], [place], [place] [address]    Culture NO GROWTH 2 DAYS  Final   Report Status PENDING  Incomplete  Culture, blood (Routine X 2) w Reflex to ID Panel     Status: None (Preliminary result)   Collection Time: 09/01/18  9:32 AM  Result Value Ref Range Status   Specimen Description BLOOD RIGHT HAND  Final   Special Requests   Final    BOTTLES DRAWN AEROBIC ONLY Blood  Culture adequate volume Performed at [place] [address], [place], [place] [address]    Culture NO GROWTH 2 DAYS  Final   Report Status PENDING  Incomplete  Culture, Urine     Status: Abnormal   Collection Time: 09/01/18  3:23 PM  Result Value Ref Range Status   Specimen Description   Final    URINE, CLEAN CATCH Performed at [facility], [place] [address], [place], [place] [address]    Special Requests   Final    NONE Performed at [facility], [place] [address], [place], [place] [address]    Culture 20,000 COLONIES/mL ENTEROBACTER AEROGENES (A)  Final   Report Status 09/03/2018 FINAL  Final   Organism ID, Bacteria ENTEROBACTER AEROGENES (A)  Final      Susceptibility   Enterobacter aerogenes - MIC*    CEFAZOLIN >=64 RESISTANT Resistant     CEFTRIAXONE 16 INTERMEDIATE Intermediate     CIPROFLOXACIN <=0.25 SENSITIVE Sensitive     GENTAMICIN <=1 SENSITIVE Sensitive     IMIPENEM <=0.25 SENSITIVE Sensitive     NITROFURANTOIN 128 RESISTANT Resistant     TRIMETH/SULFA <=20 SENSITIVE Sensitive     PIP/TAZO >=128 RESISTANT Resistant     * 20,000 COLONIES/mL ENTEROBACTER AEROGENES         Radiology Studies: No results found.      Scheduled Meds: . allopurinol  100 mg Oral Daily  . colchicine  0.6 mg Oral BID  . enoxaparin (LOVENOX) injection  40 mg Subcutaneous Q24H  . Influenza vac split quadrivalent PF  0.5 mL Intramuscular Tomorrow-1000  . metoprolol tartrate  12.5 mg Oral BID  . norethindrone  1 tablet Oral QHS  . polyethylene glycol  17 g Oral Daily   Continuous Infusions: . sodium chloride Stopped (08/31/18 0229)  . piperacillin-tazobactam (ZOSYN)  IV 3.375 g (09/03/18 0940)     LOS: 7 days    Time spent: 35 minutes    Irine Seal, MD [facility]  If 7PM-7AM, please contact night-coverage [email or web address] 09/03/2018, 12:37 PM

## 2018-09-04 DIAGNOSIS — L0291 Cutaneous abscess, unspecified: Secondary | ICD-10-CM

## 2018-09-04 DIAGNOSIS — I10 Essential (primary) hypertension: Secondary | ICD-10-CM

## 2018-09-04 DIAGNOSIS — N183 Chronic kidney disease, stage 3 (moderate): Secondary | ICD-10-CM

## 2018-09-04 DIAGNOSIS — Z86711 Personal history of pulmonary embolism: Secondary | ICD-10-CM

## 2018-09-04 DIAGNOSIS — N179 Acute kidney failure, unspecified: Secondary | ICD-10-CM

## 2018-09-04 LAB — CBC WITH DIFFERENTIAL/PLATELET
Abs Immature Granulocytes: 1.37 10*3/uL — ABNORMAL HIGH (ref 0.00–0.07)
Basophils Absolute: 0.1 10*3/uL (ref 0.0–0.1)
Basophils Relative: 0 %
Eosinophils Absolute: 0 10*3/uL (ref 0.0–0.5)
Eosinophils Relative: 0 %
HEMATOCRIT: 31.4 % — AB (ref 36.0–46.0)
Hemoglobin: 10 g/dL — ABNORMAL LOW (ref 12.0–15.0)
Immature Granulocytes: 7 %
Lymphocytes Relative: 5 %
Lymphs Abs: 1 10*3/uL (ref 0.7–4.0)
MCH: 30.7 pg (ref 26.0–34.0)
MCHC: 31.8 g/dL (ref 30.0–36.0)
MCV: 96.3 fL (ref 80.0–100.0)
Monocytes Absolute: 0.6 10*3/uL (ref 0.1–1.0)
Monocytes Relative: 3 %
Neutro Abs: 16 10*3/uL — ABNORMAL HIGH (ref 1.7–7.7)
Neutrophils Relative %: 85 %
Platelets: 204 10*3/uL (ref 150–400)
RBC: 3.26 MIL/uL — ABNORMAL LOW (ref 3.87–5.11)
RDW: 14.6 % (ref 11.5–15.5)
WBC: 19.1 10*3/uL — ABNORMAL HIGH (ref 4.0–10.5)
nRBC: 0 % (ref 0.0–0.2)

## 2018-09-04 LAB — BASIC METABOLIC PANEL
Anion gap: 8 (ref 5–15)
BUN: 16 mg/dL (ref 6–20)
CO2: 23 mmol/L (ref 22–32)
Calcium: 8.2 mg/dL — ABNORMAL LOW (ref 8.9–10.3)
Chloride: 109 mmol/L (ref 98–111)
Creatinine, Ser: 1.62 mg/dL — ABNORMAL HIGH (ref 0.44–1.00)
GFR calc Af Amer: 43 mL/min — ABNORMAL LOW (ref >60)
GFR calc non Af Amer: 37 mL/min — ABNORMAL LOW (ref >60)
Glucose, Bld: 94 mg/dL (ref 70–99)
Potassium: 4.6 mmol/L (ref 3.5–5.1)
Sodium: 140 mmol/L (ref 135–145)

## 2018-09-04 NOTE — Progress Notes (Signed)
PROGRESS NOTE    Caitlin Taylor  ZOX:[id number] DOB: 1971-07-06 DOA: 08/27/2018 PCP: [name], MD    Brief Narrative:  Caitlin Taylor is a 48 y.o.femalewith medical history significant ofDVT and PE, hypertension, chronic kidney disease, obesity presenting with left groin pain and cellulitis.     Assessment & Plan:   Principal Problem:   Abscess Active Problems:   Essential hypertension   Morbid obesity due to excess calories (HCC)   CKD (chronic kidney disease), stage III (HCC)   History of pulmonary embolus (PE)   Cellulitis of left groin   Acute renal failure superimposed on stage 3 chronic kidney disease ([place])  1 left groin abscess/cellulitis Patient with no significant improvement with left groin abscess and cellulitis still with significant pain, mons pubis is swollen, left upper thigh with ongoing cellulitis and pain to palpation as of 08/30/2018.  Patient status post bedside I&D left groin abscess 08/28/2018 with cultures growing E. coli.  Abscess site with some purulent drainage.  IV antibiotics have been changed to IV Zosyn per general surgery.  Patient being followed by general surgery and patient underwent incision and drainage of left thigh abscess and left groin abscess with cultures sent off on 08/31/2018..  Initial cultures from bedside incision and drainage with E. coli and Streptococcus group G.  Cultures from I&D done on 08/31/2018 with E. coli and Streptococcus group F.  Due to extensive nature of patient's cellulitis left thigh abscess and groin abscess ID was consulted for antibiotic recommendations and duration.  Continue IV Zosyn for now.  Per ID if patient continues to improve and not needing any further surgery would consider conversion to oral therapy with Bactrim and Augmentin with a plan of a total course of 10 to 14 days. General surgery following and appreciate input and recommendations.  2.  Acute kidney injury on chronic kidney disease stage  III Questionable etiology.  Creatinine was rising and as such IV vancomycin discontinued.  Creatinine went up as high as 2.40.  Creatinine trending down and currently at currently at 1.62 from 1.34 from 1.60 from 1.75 from 1.75 from 1.86.  Blood pressure was borderline however has improved with hydration.  IV fluids have been saline locked.  Patient spironolactone and torsemide have been resumed.  Monitor renal function.   3.  Hypertension Blood pressure was borderline however improved.  IV fluids have been saline locked.  Spironolactone and torsemide held and have been resumed.  Continue metoprolol.   4.  Left adrenal mass Incidentally noted on CT.  Will need outpatient follow-up MRI.  5.  Leukocytosis Patient noted with a worsening leukocytosis on 09/01/2018 with a white count up to 20 from 13.8.  May be secondary to problem #1.  WBC fluctuating and currently at 19.1 from 18.3.  Patient currently afebrile. Chest x-ray negative for any acute infiltrate.  Urinalysis nitrite -0-5 WBCs.  Urine cultures with 20,000 colonies of Enterobacter aerogenes which is not significant and likely colonization.  Patient denies any urinary symptoms.  Blood cultures repeated and are pending.  Continue empiric IV Zosyn for now.   6.  Hyperkalemia Resolved.    DVT prophylaxis: Lovenox Code Status: Full Family Communication: Updated patient.  No family at bedside. Disposition Plan: Home when clinically improved and medically stable and per general surgery.   Consultants:   General surgery: Dr. Ninfa Linden 08/28/2018  Infectious disease: Dr. Megan Salon 09/01/2018  Procedures:   CT abdomen and pelvis 08/27/2018  Bedside I&D 08/28/2018 per general surgery--Kelly Rayburn,  PA general surgery  Incision and drainage abscess left thigh and left groin per Dr. Ninfa Linden 08/31/2018   Chest x-ray 09/01/2018  Antimicrobials:   IV Zosyn  08/30/2018  IV Rocephin 08/27/2018>>> 08/30/2018  IV vancomycin 08/27/2018>>>>  08/28/2018   Subjective: Patient sitting up at the side of the bed.  Still with complaints of pain in the left groin slowly improving however still very painful with dressing changes.  No chest pain.  No shortness of breath.  Good urine output.  Tolerating current diet.   Objective: Vitals:   09/03/18 1648 09/03/18 2136 09/04/18 0500 09/04/18 0648  BP: 139/77 125/86  118/79  Pulse: 83 99  75  Resp: 20 16  19   Temp: 98.2 F (36.8 C) 97.7 F (36.5 C)  98 F (36.7 C)  TempSrc: Oral Oral  Oral  SpO2: 99% 99%  100%  Weight:   133.6 kg   Height:        Intake/Output Summary (Last 24 hours) at 09/04/2018 1204 Last data filed at 09/03/2018 1330 Gross per 24 hour  Intake 350 ml  Output -  Net 350 ml   Filed Weights   08/31/18 0930 09/01/18 0614 09/04/18 0500  Weight: 130.8 kg 135.2 kg 133.6 kg    Examination:  General exam: No acute distress. Respiratory system: Lungs are to auscultation bilaterally.  No wheezes, no crackles, no rhonchi.  Normal respiratory effort.  Cardiovascular system: Regular rate rhythm no murmurs rubs or gallops.  No JVD. No lower extremity pitting edema.  Gastrointestinal system: Abdomen is soft, nontender, mildly distended, positive bowel sounds.  No rebound.  No guarding.   Central nervous system: Alert and oriented. No focal neurological deficits. Extremities: Symmetric 5 x 5 power. Genitourinary: Mons pubis less swollen, some tender to palpation with some induration, left groin with open wound with no purulent drainage, decreasing surrounding erythema and swelling. Skin: No rashes, lesions or ulcers Psychiatry: Judgement and insight appear normal. Mood & affect appropriate.     Data Reviewed: I have personally reviewed following labs and imaging studies  CBC: Recent Labs  Lab 08/31/18 0350 09/01/18 0340 09/02/18 0443 09/03/18 0515 09/04/18 0558  WBC 13.8* 20.1* 19.8* 18.3* 19.1*  NEUTROABS 9.7* 17.1* 15.8* 14.9* 16.0*  HGB 10.4* 10.4* 10.0*  10.3* 10.0*  HCT 32.6* 33.1* 31.7* 32.4* 31.4*  MCV 96.2 97.4 96.9 96.1 96.3  PLT 127* 145* 177 187 233   Basic Metabolic Panel: Recent Labs  Lab 08/31/18 0350 09/01/18 0340 09/02/18 0443 09/02/18 0943 09/03/18 0515 09/04/18 0558  NA 138 139 140  --  140 140  K 4.5 4.8 5.3* 4.4 4.7 4.6  CL 105 108 111  --  110 109  CO2 25 22 23   --  22 23  GLUCOSE 124* 148* 92  --  96 94  BUN 19 18 18   --  16 16  CREATININE 1.75* 1.75* 1.60*  --  1.34* 1.62*  CALCIUM 8.2* 8.2* 8.4*  --  8.3* 8.2*  MG 2.3  --   --   --   --   --    GFR: Estimated Creatinine Clearance: 57.5 mL/min (A) (by C-G formula based on SCr of 1.62 mg/dL (H)). Liver Function Tests: No results for input(s): AST, ALT, ALKPHOS, BILITOT, PROT, ALBUMIN in the last 168 hours. No results for input(s): LIPASE, AMYLASE in the last 168 hours. No results for input(s): AMMONIA in the last 168 hours. Coagulation Profile: No results for input(s): INR, PROTIME in the last 168 hours.  Cardiac Enzymes: No results for input(s): CKTOTAL, CKMB, CKMBINDEX, TROPONINI in the last 168 hours. BNP (last 3 results) No results for input(s): PROBNP in the last 8760 hours. HbA1C: No results for input(s): HGBA1C in the last 72 hours. CBG: No results for input(s): GLUCAP in the last 168 hours. Lipid Profile: No results for input(s): CHOL, HDL, LDLCALC, TRIG, CHOLHDL, LDLDIRECT in the last 72 hours. Thyroid Function Tests: No results for input(s): TSH, T4TOTAL, FREET4, T3FREE, THYROIDAB in the last 72 hours. Anemia Panel: No results for input(s): VITAMINB12, FOLATE, FERRITIN, TIBC, IRON, RETICCTPCT in the last 72 hours. Sepsis Labs: No results for input(s): PROCALCITON, LATICACIDVEN in the last 168 hours.  Recent Results (from the past 240 hour(s))  Culture, blood (single) w Reflex to ID Panel     Status: None   Collection Time: 08/27/18  5:30 AM  Result Value Ref Range Status   Specimen Description BLOOD RIGHT ANTECUBITAL  Final   Special  Requests   Final    BOTTLES DRAWN AEROBIC AND ANAEROBIC Blood Culture adequate volume Performed at [facility], [place] [place], [place] [address]    Culture NO GROWTH 5 DAYS  Final   Report Status 09/01/2018 FINAL  Final  Aerobic Culture (superficial specimen)     Status: None   Collection Time: 08/28/18  1:23 PM  Result Value Ref Range Status   Specimen Description   Final    GROIN LEFT Performed at [facility], [place] [address], [place], [place] [address]    Special Requests   Final    NONE Performed at [facility], [place] [address], [place], [place] [address]    Gram Stain   Final    FEW WBC PRESENT,BOTH PMN AND MONONUCLEAR FEW GRAM POSITIVE COCCI RARE GRAM VARIABLE ROD Performed at [facility], [place] [address], [place], [place] [address]    Culture FEW ESCHERICHIA COLI FEW STREPTOCOCCUS GROUP G   Final   Report Status 08/30/2018 FINAL  Final   Organism ID, Bacteria ESCHERICHIA COLI  Final      Susceptibility   Escherichia coli - MIC*    AMPICILLIN >=32 RESISTANT Resistant     CEFAZOLIN 8 SENSITIVE Sensitive     CEFEPIME <=1 SENSITIVE Sensitive     CEFTAZIDIME <=1 SENSITIVE Sensitive     CEFTRIAXONE <=1 SENSITIVE Sensitive     CIPROFLOXACIN <=0.25 SENSITIVE Sensitive     GENTAMICIN <=1 SENSITIVE Sensitive     IMIPENEM <=0.25 SENSITIVE Sensitive     TRIMETH/SULFA <=20 SENSITIVE Sensitive     AMPICILLIN/SULBACTAM >=32 RESISTANT Resistant     PIP/TAZO <=4 SENSITIVE Sensitive     Extended ESBL NEGATIVE Sensitive     * FEW ESCHERICHIA COLI  Aerobic/Anaerobic Culture (surgical/deep wound)     Status: None (Preliminary result)   Collection Time: 08/31/18 10:20 AM  Result Value Ref Range Status   Specimen Description   Final    SFT TISS OTH Performed at [place] [address], [place],  [address]    Special Requests   Final    NONE Performed at [facility], [place]  [address] [place],  [address]    Gram Stain   Final    FEW WBC PRESENT, PREDOMINANTLY PMN NO ORGANISMS SEEN    Culture   Final    FEW ESCHERICHIA COLI CRITICAL RESULT CALLED TO, READ BACK BY AND VERIFIED WITH: C. HUGHEY, RN ([facility]) CONCERNING CULTURE GROWTH AT  1750 ON 09/02/18 BY C. JESSUP, MLT. HOLDING FOR POSSIBLE ANAEROBE Performed at [facility], [place] [address] [place], [place] [address]    Report Status PENDING  Incomplete   Organism ID, Bacteria ESCHERICHIA COLI  Final      Susceptibility   Escherichia coli - MIC*    AMPICILLIN >=32 RESISTANT Resistant     CEFAZOLIN 8 SENSITIVE Sensitive     CEFEPIME <=1 SENSITIVE Sensitive     CEFTAZIDIME <=1 SENSITIVE Sensitive     CEFTRIAXONE <=1 SENSITIVE Sensitive     CIPROFLOXACIN <=0.25 SENSITIVE Sensitive     GENTAMICIN <=1 SENSITIVE Sensitive     IMIPENEM <=0.25 SENSITIVE Sensitive     TRIMETH/SULFA <=20 SENSITIVE Sensitive     AMPICILLIN/SULBACTAM >=32 RESISTANT Resistant     PIP/TAZO 8 SENSITIVE Sensitive     Extended ESBL NEGATIVE Sensitive     * FEW ESCHERICHIA COLI  Culture, blood (Routine X 2) w Reflex to ID Panel     Status: None (Preliminary result)   Collection Time: 09/01/18  9:30 AM  Result Value Ref Range Status   Specimen Description BLOOD LEFT HAND  Final   Special Requests   Final    BOTTLES DRAWN AEROBIC ONLY Blood Culture results may not be optimal due to an inadequate volume of blood received in culture bottles Performed at [place] [address] [place], [place] [address]    Culture NO GROWTH 2 DAYS  Final   Report Status PENDING  Incomplete  Culture, blood (Routine X 2) w Reflex to ID Panel     Status: None (Preliminary result)   Collection Time: 09/01/18  9:32 AM  Result Value Ref Range Status   Specimen Description BLOOD RIGHT HAND  Final   Special Requests   Final    BOTTLES DRAWN AEROBIC ONLY Blood Culture adequate volume Performed at [place] [address], [place], [place] [address]    Culture NO GROWTH 2 DAYS  Final   Report Status PENDING  Incomplete  Culture, Urine     Status: Abnormal   Collection Time: 09/01/18  3:23 PM  Result Value Ref Range Status   Specimen Description   Final    URINE, CLEAN CATCH Performed at [facility], [place] [address], [place], [place] [address]    Special Requests   Final    NONE Performed at [facility], [place] [address] [place], [place] [address]    Culture 20,000 COLONIES/mL ENTEROBACTER AEROGENES (A)  Final   Report Status 09/03/2018 FINAL  Final   Organism ID, Bacteria ENTEROBACTER AEROGENES (A)  Final      Susceptibility   Enterobacter aerogenes - MIC*    CEFAZOLIN >=64 RESISTANT Resistant     CEFTRIAXONE 16 INTERMEDIATE Intermediate     CIPROFLOXACIN <=0.25 SENSITIVE Sensitive     GENTAMICIN <=1 SENSITIVE Sensitive     IMIPENEM <=0.25 SENSITIVE Sensitive     NITROFURANTOIN 128 RESISTANT Resistant     TRIMETH/SULFA <=20 SENSITIVE Sensitive     PIP/TAZO >=128 RESISTANT Resistant     * 20,000 COLONIES/mL ENTEROBACTER AEROGENES         Radiology Studies: No results found.      Scheduled Meds: . allopurinol  100 mg Oral Daily  . colchicine  0.6 mg Oral BID  . enoxaparin (LOVENOX) injection  40 mg Subcutaneous Q24H  . Influenza vac split quadrivalent PF  0.5 mL Intramuscular Tomorrow-1000  . metoprolol tartrate  12.5 mg Oral BID  . polyethylene glycol  17 g Oral Daily  . spironolactone  25 mg Oral Daily  . torsemide  20 mg Oral Daily   Continuous Infusions: . sodium chloride Stopped (08/31/18 0229)  . piperacillin-tazobactam (ZOSYN)  IV 3.375 g (09/04/18 0130)     LOS: 8 days    Time spent: 35 minutes    Irine Seal, MD [facility]  If 7PM-7AM, please contact night-coverage [email or web address] 09/04/2018, 12:04 PM

## 2018-09-04 NOTE — Progress Notes (Signed)
Patient ID: Caitlin Taylor, female   DOB: [birth year], 48 y.o.   MRN: [id number]         [facility]  Date of Admission:  08/27/2018   Total days of antibiotics 9        Day 6 piperacillin tazobactam         ASSESSMENT: She is improving slowly on therapy for polymicrobial (E. coli, group F strep, group G strep and anaerobes) abscesses of her left groin and thigh.  She is only on day 6 of effective therapy.  She grew Enterobacter from urine culture but this represents asymptomatic colonization and does not need treatment.  PLAN: 1. Continue piperacillin tazobactam for now  Principal Problem:   Abscess Active Problems:   Cellulitis of left groin   Essential hypertension   Morbid obesity due to excess calories (HCC)   CKD (chronic kidney disease), stage III (HCC)   History of pulmonary embolus (PE)   Acute renal failure superimposed on stage 3 chronic kidney disease (HCC)   Scheduled Meds: . allopurinol  100 mg Oral Daily  . colchicine  0.6 mg Oral BID  . enoxaparin (LOVENOX) injection  40 mg Subcutaneous Q24H  . Influenza vac split quadrivalent PF  0.5 mL Intramuscular Tomorrow-1000  . metoprolol tartrate  12.5 mg Oral BID  . polyethylene glycol  17 g Oral Daily  . spironolactone  25 mg Oral Daily  . torsemide  20 mg Oral Daily   Continuous Infusions: . sodium chloride Stopped (08/31/18 0229)  . piperacillin-tazobactam (ZOSYN)  IV 3.375 g (09/04/18 1209)   PRN Meds:.sodium chloride, acetaminophen **OR** acetaminophen, morphine injection, ondansetron (ZOFRAN) IV, oxyCODONE   SUBJECTIVE: Her pain is under a little bit better control.  She feels like she is improving slowly.  Review of Systems: Review of Systems  Constitutional: Negative for chills, diaphoresis and fever.  Gastrointestinal: Negative for abdominal pain, diarrhea, nausea and vomiting.    No Known Allergies  OBJECTIVE: Vitals:   09/03/18 2136 09/04/18 0500 09/04/18 0648  09/04/18 1357  BP: 125/86  118/79 126/79  Pulse: 99  75 73  Resp: 16  19 16   Temp: 97.7 F (36.5 C)  98 F (36.7 C) 97.8 F (36.6 C)  TempSrc: Oral  Oral Oral  SpO2: 99%  100% 96%  Weight:  133.6 kg    Height:       Body mass index is 52.17 kg/m.  Physical Exam Constitutional:      Comments: She is resting quietly in bed.  Her nurse just changed her left groin and thigh dressings.  Musculoskeletal:     Comments: Her dressings are clean and dry.  The erythema and induration extending laterally from her wounds around to her buttocks have decreased.  Psychiatric:        Mood and Affect: Mood normal.     Lab Results Lab Results  Component Value Date   WBC 19.1 (H) 09/04/2018   HGB 10.0 (L) 09/04/2018   HCT 31.4 (L) 09/04/2018   MCV 96.3 09/04/2018   PLT 204 09/04/2018    Lab Results  Component Value Date   CREATININE 1.62 (H) 09/04/2018   BUN 16 09/04/2018   NA 140 09/04/2018   K 4.6 09/04/2018   CL 109 09/04/2018   CO2 23 09/04/2018    Lab Results  Component Value Date   ALT 38 08/28/2018   AST 20 08/28/2018   ALKPHOS 81 08/28/2018   BILITOT 0.9 08/28/2018  Microbiology: Recent Results (from the past 240 hour(s))  Culture, blood (single) w Reflex to ID Panel     Status: None   Collection Time: 08/27/18  5:30 AM  Result Value Ref Range Status   Specimen Description BLOOD RIGHT ANTECUBITAL  Final   Special Requests   Final    BOTTLES DRAWN AEROBIC AND ANAEROBIC Blood Culture adequate volume Performed at [facility], [address] [place], [place] [address]    Culture NO GROWTH 5 DAYS  Final   Report Status 09/01/2018 FINAL  Final  Aerobic Culture (superficial specimen)     Status: None   Collection Time: 08/28/18  1:23 PM  Result Value Ref Range Status   Specimen Description   Final    GROIN LEFT Performed at [facility], [place] [address], [place], [place] [address]    Special Requests   Final    NONE Performed at [facility], [place] [address] [place], [place] [address]    Gram Stain   Final    FEW WBC PRESENT,BOTH PMN AND MONONUCLEAR FEW GRAM POSITIVE COCCI RARE GRAM VARIABLE ROD Performed at [facility], [place] [address] [place], [place] [address]    Culture FEW ESCHERICHIA COLI FEW STREPTOCOCCUS GROUP G   Final   Report Status 08/30/2018 FINAL  Final   Organism ID, Bacteria ESCHERICHIA COLI  Final      Susceptibility   Escherichia coli - MIC*    AMPICILLIN >=32 RESISTANT Resistant     CEFAZOLIN 8 SENSITIVE Sensitive     CEFEPIME <=1 SENSITIVE Sensitive     CEFTAZIDIME <=1 SENSITIVE Sensitive     CEFTRIAXONE <=1 SENSITIVE Sensitive     CIPROFLOXACIN <=0.25 SENSITIVE Sensitive     GENTAMICIN <=1 SENSITIVE Sensitive     IMIPENEM <=0.25 SENSITIVE Sensitive     TRIMETH/SULFA <=20 SENSITIVE Sensitive     AMPICILLIN/SULBACTAM >=32 RESISTANT Resistant     PIP/TAZO <=4 SENSITIVE Sensitive     Extended ESBL NEGATIVE Sensitive     * FEW ESCHERICHIA COLI  Aerobic/Anaerobic Culture (surgical/deep wound)     Status: None (Preliminary result)   Collection Time: 08/31/18 10:20 AM  Result Value Ref Range Status   Specimen Description   Final    SFT TISS OTH Performed at [place] [address] [place], [place] [address]    Special Requests   Final    NONE Performed at [facility], [place] [address] [place] [address] [place], [place] [address]    Gram Stain   Final    FEW WBC PRESENT, PREDOMINANTLY PMN NO ORGANISMS SEEN    Culture   Final    FEW ESCHERICHIA COLI CRITICAL RESULT CALLED TO, READ BACK BY AND VERIFIED WITH: C. HUGHEY, RN ([facility]) CONCERNING CULTURE GROWTH AT 1750 ON 09/02/18 BY C. JESSUP, MLT. RARE STREPTOCOCCUS GROUP F HOLDING FOR POSSIBLE ANAEROBE Performed at [facility], [place] [address], [place],  [address]    Report Status PENDING  Incomplete   Organism ID, Bacteria ESCHERICHIA COLI  Final      Susceptibility   Escherichia  coli - MIC*    AMPICILLIN >=32 RESISTANT Resistant     CEFAZOLIN 8 SENSITIVE Sensitive     CEFEPIME <=1 SENSITIVE Sensitive     CEFTAZIDIME <=1 SENSITIVE Sensitive     CEFTRIAXONE <=1 SENSITIVE Sensitive     CIPROFLOXACIN <=0.25 SENSITIVE Sensitive     GENTAMICIN <=1 SENSITIVE Sensitive  IMIPENEM <=0.25 SENSITIVE Sensitive     TRIMETH/SULFA <=20 SENSITIVE Sensitive     AMPICILLIN/SULBACTAM >=32 RESISTANT Resistant     PIP/TAZO 8 SENSITIVE Sensitive     Extended ESBL NEGATIVE Sensitive     * FEW ESCHERICHIA COLI  Culture, blood (Routine X 2) w Reflex to ID Panel     Status: None (Preliminary result)   Collection Time: 09/01/18  9:30 AM  Result Value Ref Range Status   Specimen Description   Final    BLOOD LEFT HAND Performed at [facility] [address], [place], [place] [address]    Special Requests   Final    BOTTLES DRAWN AEROBIC ONLY Blood Culture results may not be optimal due to an inadequate volume of blood received in culture bottles Performed at [place] [address], [place], [place] [address]    Culture   Final    NO GROWTH 3 DAYS Performed at [facility], [place] [address] [place], [place] [address]    Report Status PENDING  Incomplete  Culture, blood (Routine X 2) w Reflex to ID Panel     Status: None (Preliminary result)   Collection Time: 09/01/18  9:32 AM  Result Value Ref Range Status   Specimen Description   Final    BLOOD RIGHT HAND Performed at [place] [address] [place], [place] [address]    Special Requests   Final    BOTTLES DRAWN AEROBIC ONLY Blood Culture adequate volume Performed at [place] [address] [place], [place] [address]    Culture   Final    NO GROWTH 3 DAYS Performed at [facility], [place] [address], [place], [place] [address]    Report Status PENDING  Incomplete  Culture, Urine     Status: Abnormal   Collection Time:  09/01/18  3:23 PM  Result Value Ref Range Status   Specimen Description   Final    URINE, CLEAN CATCH Performed at [facility], [place] [address], [place], [place] [address]    Special Requests   Final    NONE Performed at [facility], [place] [address], [place], [place] [address]    Culture 20,000 COLONIES/mL ENTEROBACTER AEROGENES (A)  Final   Report Status 09/03/2018 FINAL  Final   Organism ID, Bacteria ENTEROBACTER AEROGENES (A)  Final      Susceptibility   Enterobacter aerogenes - MIC*    CEFAZOLIN >=64 RESISTANT Resistant     CEFTRIAXONE 16 INTERMEDIATE Intermediate     CIPROFLOXACIN <=0.25 SENSITIVE Sensitive     GENTAMICIN <=1 SENSITIVE Sensitive     IMIPENEM <=0.25 SENSITIVE Sensitive     NITROFURANTOIN 128 RESISTANT Resistant     TRIMETH/SULFA <=20 SENSITIVE Sensitive     PIP/TAZO >=128 RESISTANT Resistant     * 20,000 COLONIES/mL ENTEROBACTER AEROGENES    Michel Bickers, [place] for [place] [phone] pager   [phone] cell 09/04/2018, 3:36 PM

## 2018-09-04 NOTE — Progress Notes (Signed)
[facility] Progress Note  4 Days Post-Op  Subjective: CC-  Tired this morning. Continues to have pain at surgical site, but no better or worse than yesterday. Pain with dressing changes. States that cellulitis looks better to her. WBC still elevated at 19.1, afebrile. Tolerating diet and having bowel function. Denies any new symptoms, denies dysuria, cough, chest pain, shortness of breath.  Objective: Vital signs in last 24 hours: Temp:  [97.7 F (36.5 C)-98.2 F (36.8 C)] 98 F (36.7 C) (02/24 0648) Pulse Rate:  [75-99] 75 (02/24 0648) Resp:  [16-20] 19 (02/24 0648) BP: (118-139)/(77-86) 118/79 (02/24 0648) SpO2:  [99 %-100 %] 100 % (02/24 0648) Weight:  [133.6 kg] 133.6 kg (02/24 0500) Last BM Date: 09/02/18  Intake/Output from previous day: 02/23 0701 - 02/24 0700 In: 600 [P.O.:600] Out: -  Intake/Output this shift: No intake/output data recorded.  PE: Gen:  Alert, NAD, pleasant HEENT: EOM's intact, pupils equal and round Pulm:  effort normal Abd: Soft, NT/ND, +BS Psych: A&Ox3  Skin: warm and dry Ext: L groin wound pink with no purulent drainage, probed wound and did not find any abscess cavity, still has significant surrounding cellulitis (per patient this is improving)     Lab Results:  Recent Labs    09/03/18 0515 09/04/18 0558  WBC 18.3* 19.1*  HGB 10.3* 10.0*  HCT 32.4* 31.4*  PLT 187 204   BMET Recent Labs    09/03/18 0515 09/04/18 0558  NA 140 140  K 4.7 4.6  CL 110 109  CO2 22 23  GLUCOSE 96 94  BUN 16 16  CREATININE 1.34* 1.62*  CALCIUM 8.3* 8.2*   PT/INR No results for input(s): LABPROT, INR in the last 72 hours. CMP     Component Value Date/Time   NA 140 09/04/2018 0558   K 4.6 09/04/2018 0558   CL 109 09/04/2018 0558   CO2 23 09/04/2018 0558   GLUCOSE 94 09/04/2018 0558   BUN 16 09/04/2018 0558   CREATININE 1.62 (H) 09/04/2018 0558   CALCIUM 8.2 (L) 09/04/2018 0558   PROT 5.9 (L) 08/28/2018 0357   ALBUMIN  2.9 (L) 08/28/2018 0357   AST 20 08/28/2018 0357   ALT 38 08/28/2018 0357   ALKPHOS 81 08/28/2018 0357   BILITOT 0.9 08/28/2018 0357   GFRNONAA 37 (L) 09/04/2018 0558   GFRAA 43 (L) 09/04/2018 0558   Lipase  No results found for: LIPASE     Studies/Results: No results found.  Anti-infectives: Anti-infectives (From admission, onward)   Start     Dose/Rate Route Frequency Ordered Stop   08/30/18 2000  doxycycline (VIBRAMYCIN) 100 mg in sodium chloride 0.9 % 250 mL IVPB  Status:  Discontinued     100 mg 125 mL/hr over 120 Minutes Intravenous Every 12 hours 08/30/18 1944 09/01/18 1600   08/30/18 1000  piperacillin-tazobactam (ZOSYN) IVPB 3.375 g     3.375 g 12.5 mL/hr over 240 Minutes Intravenous Every 8 hours 08/30/18 0928     08/28/18 1100  vancomycin (VANCOCIN) IVPB 750 mg/150 ml premix  Status:  Discontinued     750 mg 150 mL/hr over 60 Minutes Intravenous Every 24 hours 08/27/18 1225 08/28/18 1039   08/28/18 1045  vancomycin variable dose per unstable renal function (pharmacist dosing)  Status:  Discontinued      Does not apply See admin instructions 08/28/18 1045 08/28/18 1331   08/27/18 1800  cefTRIAXone (ROCEPHIN) 2 g in sodium chloride 0.9 % 100 mL IVPB  Status:  Discontinued     2 g 200 mL/hr over 30 Minutes Intravenous Every 24 hours 08/27/18 0759 08/30/18 0923   08/27/18 0900  vancomycin (VANCOCIN) 2,000 mg in sodium chloride 0.9 % 500 mL IVPB     2,000 mg 250 mL/hr over 120 Minutes Intravenous  Once 08/27/18 0811 08/27/18 1111   08/27/18 0515  cefTRIAXone (ROCEPHIN) 1 g in sodium chloride 0.9 % 100 mL IVPB     1 g 200 mL/hr over 30 Minutes Intravenous  Once 08/27/18 0502 08/27/18 0603   08/27/18 0500  vancomycin (VANCOCIN) IVPB 1000 mg/200 mL premix  Status:  Discontinued     1,000 mg 200 mL/hr over 60 Minutes Intravenous  Once 08/27/18 0446 08/27/18 0810       Assessment/Plan AKI on CKD-III - Cr up 1.62 HTN Obesity   Left groin / thigh abscess S/p I&D  Dr. Ninfa Linden 2/20 - POD 4 - culture: ESCHERICHIA COLI  - BID wet to dry dressing changes - WBC still elevated 19.1, afebrile  ID - zosyn 2/19>>, doxycycline 2/19>>2/21, vancomycin 2/16>>2/17 FEN - reg diet VTE - SCDs, lovenox Foley - none  Plan - Wound seems to be cleaning up well, no purulent drainage noted today, but still with some cellulitis. Continue BID wet to dry dressing changes. Shower with wound open. abx per ID.   LOS: 8 days    Wellington Hampshire , [facility] 09/04/2018, 8:57 AM Pager: [phone] Mon-Thurs 7:00 am-4:30 pm Fri 7:00 am -11:30 AM Sat-Sun 7:00 am-11:30 am

## 2018-09-05 LAB — AEROBIC/ANAEROBIC CULTURE (SURGICAL/DEEP WOUND)

## 2018-09-05 LAB — CBC WITH DIFFERENTIAL/PLATELET
Abs Immature Granulocytes: 1.04 10*3/uL — ABNORMAL HIGH (ref 0.00–0.07)
Basophils Absolute: 0.1 10*3/uL (ref 0.0–0.1)
Basophils Relative: 0 %
Eosinophils Absolute: 0 10*3/uL (ref 0.0–0.5)
Eosinophils Relative: 0 %
HCT: 32.7 % — ABNORMAL LOW (ref 36.0–46.0)
Hemoglobin: 10.5 g/dL — ABNORMAL LOW (ref 12.0–15.0)
IMMATURE GRANULOCYTES: 7 %
LYMPHS ABS: 1 10*3/uL (ref 0.7–4.0)
Lymphocytes Relative: 7 %
MCH: 31.1 pg (ref 26.0–34.0)
MCHC: 32.1 g/dL (ref 30.0–36.0)
MCV: 96.7 fL (ref 80.0–100.0)
MONOS PCT: 5 %
Monocytes Absolute: 0.7 10*3/uL (ref 0.1–1.0)
Neutro Abs: 11.8 10*3/uL — ABNORMAL HIGH (ref 1.7–7.7)
Neutrophils Relative %: 81 %
Platelets: 229 10*3/uL (ref 150–400)
RBC: 3.38 MIL/uL — ABNORMAL LOW (ref 3.87–5.11)
RDW: 14.6 % (ref 11.5–15.5)
WBC: 14.5 10*3/uL — ABNORMAL HIGH (ref 4.0–10.5)
nRBC: 0 % (ref 0.0–0.2)

## 2018-09-05 LAB — BASIC METABOLIC PANEL
Anion gap: 6 (ref 5–15)
BUN: 15 mg/dL (ref 6–20)
CHLORIDE: 108 mmol/L (ref 98–111)
CO2: 26 mmol/L (ref 22–32)
Calcium: 8.4 mg/dL — ABNORMAL LOW (ref 8.9–10.3)
Creatinine, Ser: 1.59 mg/dL — ABNORMAL HIGH (ref 0.44–1.00)
GFR calc Af Amer: 44 mL/min — ABNORMAL LOW (ref >60)
GFR calc non Af Amer: 38 mL/min — ABNORMAL LOW (ref >60)
Glucose, Bld: 104 mg/dL — ABNORMAL HIGH (ref 70–99)
Potassium: 4.3 mmol/L (ref 3.5–5.1)
Sodium: 140 mmol/L (ref 135–145)

## 2018-09-05 LAB — AEROBIC/ANAEROBIC CULTURE W GRAM STAIN (SURGICAL/DEEP WOUND)

## 2018-09-05 MED ORDER — SULFAMETHOXAZOLE-TRIMETHOPRIM 800-160 MG PO TABS
1.0000 | ORAL_TABLET | Freq: Two times a day (BID) | ORAL | Status: DC
  Administered 2018-09-05 – 2018-09-11 (×13): 1 via ORAL
  Filled 2018-09-05 (×13): qty 1

## 2018-09-05 MED ORDER — OXYCODONE HCL 5 MG PO TABS
5.0000 mg | ORAL_TABLET | ORAL | Status: DC | PRN
  Administered 2018-09-05 – 2018-09-11 (×19): 10 mg via ORAL
  Filled 2018-09-05 (×19): qty 2

## 2018-09-05 MED ORDER — AMOXICILLIN-POT CLAVULANATE 875-125 MG PO TABS
1.0000 | ORAL_TABLET | Freq: Two times a day (BID) | ORAL | Status: DC
  Administered 2018-09-05 – 2018-09-09 (×9): 1 via ORAL
  Filled 2018-09-05 (×9): qty 1

## 2018-09-05 NOTE — Progress Notes (Signed)
[facility] Progress Note  5 Days Post-Op  Subjective: CC-  Overall feeling a little better today. Continues to have a lot of pain with dressing changes. WBC trending down, 14.5, afebrile.  Objective: Vital signs in last 24 hours: Temp:  [97.8 F (36.6 C)-98.6 F (37 C)] 98.4 F (36.9 C) (02/25 0621) Pulse Rate:  [73-78] 78 (02/25 0621) Resp:  [14-16] 16 (02/25 0621) BP: (126-133)/(78-88) 132/88 (02/25 0621) SpO2:  [96 %-98 %] 96 % (02/25 0621) Weight:  [132.8 kg] 132.8 kg (02/25 0621) Last BM Date: 09/04/18  Intake/Output from previous day: 02/24 0701 - 02/25 0700 In: 753.6 [P.O.:240; IV Piggyback:513.6] Out: 1600 [Urine:1600] Intake/Output this shift: No intake/output data recorded.  PE: Gen:  Alert, NAD, pleasant HEENT: EOM's intact, pupils equal and round Pulm:  effort normal Abd: Soft, NT/ND, +BS Psych: A&Ox3  Skin: warm and dry Ext: L groin wound pink with yellow slough, still has significant surrounding cellulitis about the same, induration slightly improved   More medial wound light pink with purulent drainage noted at base     Lab Results:  Recent Labs    09/04/18 0558 09/05/18 0457  WBC 19.1* 14.5*  HGB 10.0* 10.5*  HCT 31.4* 32.7*  PLT 204 229   BMET Recent Labs    09/04/18 0558 09/05/18 0457  NA 140 140  K 4.6 4.3  CL 109 108  CO2 23 26  GLUCOSE 94 104*  BUN 16 15  CREATININE 1.62* 1.59*  CALCIUM 8.2* 8.4*   PT/INR No results for input(s): LABPROT, INR in the last 72 hours. CMP     Component Value Date/Time   NA 140 09/05/2018 0457   K 4.3 09/05/2018 0457   CL 108 09/05/2018 0457   CO2 26 09/05/2018 0457   GLUCOSE 104 (H) 09/05/2018 0457   BUN 15 09/05/2018 0457   CREATININE 1.59 (H) 09/05/2018 0457   CALCIUM 8.4 (L) 09/05/2018 0457   PROT 5.9 (L) 08/28/2018 0357   ALBUMIN 2.9 (L) 08/28/2018 0357   AST 20 08/28/2018 0357   ALT 38 08/28/2018 0357   ALKPHOS 81 08/28/2018 0357   BILITOT 0.9 08/28/2018 0357     GFRNONAA 38 (L) 09/05/2018 0457   GFRAA 44 (L) 09/05/2018 0457   Lipase  No results found for: LIPASE     Studies/Results: No results found.  Anti-infectives: Anti-infectives (From admission, onward)   Start     Dose/Rate Route Frequency Ordered Stop   08/30/18 2000  doxycycline (VIBRAMYCIN) 100 mg in sodium chloride 0.9 % 250 mL IVPB  Status:  Discontinued     100 mg 125 mL/hr over 120 Minutes Intravenous Every 12 hours 08/30/18 1944 09/01/18 1600   08/30/18 1000  piperacillin-tazobactam (ZOSYN) IVPB 3.375 g     3.375 g 12.5 mL/hr over 240 Minutes Intravenous Every 8 hours 08/30/18 0928     08/28/18 1100  vancomycin (VANCOCIN) IVPB 750 mg/150 ml premix  Status:  Discontinued     750 mg 150 mL/hr over 60 Minutes Intravenous Every 24 hours 08/27/18 1225 08/28/18 1039   08/28/18 1045  vancomycin variable dose per unstable renal function (pharmacist dosing)  Status:  Discontinued      Does not apply See admin instructions 08/28/18 1045 08/28/18 1331   08/27/18 1800  cefTRIAXone (ROCEPHIN) 2 g in sodium chloride 0.9 % 100 mL IVPB  Status:  Discontinued     2 g 200 mL/hr over 30 Minutes Intravenous Every 24 hours 08/27/18 0759 08/30/18 0923   08/27/18  0900  vancomycin (VANCOCIN) 2,000 mg in sodium chloride 0.9 % 500 mL IVPB     2,000 mg 250 mL/hr over 120 Minutes Intravenous  Once 08/27/18 0811 08/27/18 1111   08/27/18 0515  cefTRIAXone (ROCEPHIN) 1 g in sodium chloride 0.9 % 100 mL IVPB     1 g 200 mL/hr over 30 Minutes Intravenous  Once 08/27/18 0502 08/27/18 0603   08/27/18 0500  vancomycin (VANCOCIN) IVPB 1000 mg/200 mL premix  Status:  Discontinued     1,000 mg 200 mL/hr over 60 Minutes Intravenous  Once 08/27/18 0446 08/27/18 0810       Assessment/Plan AKI on CKD-III - Cr down 1.59 HTN Obesity   Left groin / thigh abscess S/p I&D Dr. Ninfa Linden 2/20 - POD 5 - culture: ESCHERICHIA COLI  - TID wet to dry dressing changes - WBC trending down 14.5, afebrile  ID -  zosyn 2/19>>, doxycycline 2/19>>2/21, vancomycin 2/16>>2/17 FEN - reg diet VTE - SCDs, lovenox Foley - none  Plan - Increase dressing changes to TID. Patient going to get in the shower this morning. Will start trying to transition towards using oral pain medications during dressing changes. Continue IV abx.   LOS: 9 days    Wellington Hampshire , [facility] 09/05/2018, 7:59 AM Pager: [phone] Mon-Thurs 7:00 am-4:30 pm Fri 7:00 am -11:30 AM Sat-Sun 7:00 am-11:30 am

## 2018-09-05 NOTE — Progress Notes (Signed)
PROGRESS NOTE    CORTNIE RINGEL  DTO:[id number] DOB: 1971/06/12 DOA: 08/27/2018 PCP: [name], MD    Brief Narrative:  Caitlin Taylor is a 48 y.o.femalewith medical history significant ofDVT and PE, hypertension, chronic kidney disease, obesity presenting with left groin pain and cellulitis.  General surgery consulted and patient underwent incision and drainage x2.  Patient was placed on IV Zosyn and has been transitioned to oral Bactrim and Augmentin.  ID also consulted and following.    Assessment & Plan:   Principal Problem:   Abscess Active Problems:   Essential hypertension   Morbid obesity due to excess calories (HCC)   CKD (chronic kidney disease), stage III (HCC)   History of pulmonary embolus (PE)   Cellulitis of left groin   Acute renal failure superimposed on stage 3 chronic kidney disease ([name])  1 left groin abscess/cellulitis Patient with no significant improvement with left groin abscess and cellulitis still with significant pain, mons pubis is swollen, left upper thigh with ongoing cellulitis and pain to palpation as of 08/30/2018.  Patient status post bedside I&D left groin abscess 08/28/2018 with cultures growing E. coli.  Abscess site with some purulent drainage.  IV antibiotics have been changed to IV Zosyn per general surgery.  Patient being followed by general surgery and patient underwent incision and drainage of left thigh abscess and left groin abscess with cultures sent off on 08/31/2018..  Initial cultures from bedside incision and drainage with E. coli and Streptococcus group G.  Cultures from I&D done on 08/31/2018 with E. coli and Streptococcus group F.  Due to extensive nature of patient's cellulitis left thigh abscess and groin abscess ID was consulted for antibiotic recommendations and duration.  IV Zosyn has been transitioned to oral Bactrim and oral Augmentin per ID recommendations to complete a total course of 10 to 14 days.  ID and general  surgery following and appreciate their input and recommendations.   2.  Acute kidney injury on chronic kidney disease stage III Questionable etiology.  Creatinine was rising and as such IV vancomycin discontinued.  Creatinine went up as high as 2.40.  Creatinine trending down and currently at currently at 1.59 from 1.62 from 1.34 from 1.60 from 1.75 from 1.75 from 1.86. Patient spironolactone and torsemide have been resumed.  Monitor renal function.   3.  Hypertension Blood pressure was borderline however improved.  IV fluids have been saline locked.  Spironolactone and torsemide held and have been resumed.  Patient on metoprolol.  Blood pressure stable.  Follow.   4.  Left adrenal mass Incidentally noted on CT.  Will need outpatient follow-up MRI.  5.  Leukocytosis Patient noted with a worsening leukocytosis on 09/01/2018 with a white count up to 20 from 13.8.  May be secondary to problem #1.  WBC was fluctuating and started to trend back down and currently at 14.5 from 19.1 from 18.3.  Patient currently afebrile. Chest x-ray negative for any acute infiltrate.  Urinalysis nitrite -0-5 WBCs.  Urine cultures with 20,000 colonies of Enterobacter aerogenes which is not significant and likely colonization.  Patient denies any urinary symptoms.  Blood cultures repeated and are pending.  IV antibiotics have been changed to oral Bactrim and oral Augmentin per ID.  Follow.   6.  Hyperkalemia Resolved.    DVT prophylaxis: Lovenox Code Status: Full Family Communication: Updated patient.  No family at bedside. Disposition Plan: Home when clinically improved and medically stable and per general surgery.   Consultants:  General surgery: Dr. Ninfa Linden 08/28/2018  Infectious disease: Dr. Megan Salon 09/01/2018  Procedures:   CT abdomen and pelvis 08/27/2018  Bedside I&D 08/28/2018 per general surgery--Kelly Rayburn, PA general surgery  Incision and drainage abscess left thigh and left groin per Dr.  Ninfa Linden 08/31/2018   Chest x-ray 09/01/2018  Antimicrobials:   IV Zosyn  08/30/2018>>>>> 09/05/2018  IV Rocephin 08/27/2018>>> 08/30/2018  IV vancomycin 08/27/2018>>>> 08/28/2018  Oral Bactrim 09/05/2018  Oral Augmentin 09/05/2018   Subjective: Patient in bed.  States still with some pain with dressing changes.  No chest pain.  No shortness of breath.  Good urine output.  Tolerating current diet.   Objective: Vitals:   09/04/18 0648 09/04/18 1357 09/04/18 2202 09/05/18 0621  BP: 118/79 126/79 133/78 132/88  Pulse: 75 73 78 78  Resp: 19 16 14 16   Temp: 98 F (36.7 C) 97.8 F (36.6 C) 98.6 F (37 C) 98.4 F (36.9 C)  TempSrc: Oral Oral Oral Oral  SpO2: 100% 96% 98% 96%  Weight:    132.8 kg  Height:        Intake/Output Summary (Last 24 hours) at 09/05/2018 1257 Last data filed at 09/05/2018 0845 Gross per 24 hour  Intake 993.57 ml  Output 1600 ml  Net -606.43 ml   Filed Weights   09/01/18 0614 09/04/18 0500 09/05/18 0621  Weight: 135.2 kg 133.6 kg 132.8 kg    Examination:  General exam: NAD Respiratory system: CTA B.  No wheezes, no crackles, no rhonchi.  Normal respiratory effort.  Cardiovascular system: RRR no murmurs rubs or gallops.  No JVD.  No lower extremity edema.   Gastrointestinal system: Abdomen is nontender, mildly distended, soft, positive bowel sounds.  No rebound.  No guarding.  Central nervous system: Alert and oriented. No focal neurological deficits. Extremities: Symmetric 5 x 5 power. Genitourinary: Mons pubis less swollen, some tender to palpation with some induration, left groin with open wound with no purulent drainage, decreasing surrounding erythema and swelling. Skin: No rashes, lesions or ulcers Psychiatry: Judgement and insight appear normal. Mood & affect appropriate.     Data Reviewed: I have personally reviewed following labs and imaging studies  CBC: Recent Labs  Lab 09/01/18 0340 09/02/18 0443 09/03/18 0515 09/04/18 0558  09/05/18 0457  WBC 20.1* 19.8* 18.3* 19.1* 14.5*  NEUTROABS 17.1* 15.8* 14.9* 16.0* 11.8*  HGB 10.4* 10.0* 10.3* 10.0* 10.5*  HCT 33.1* 31.7* 32.4* 31.4* 32.7*  MCV 97.4 96.9 96.1 96.3 96.7  PLT 145* 177 187 204 846   Basic Metabolic Panel: Recent Labs  Lab 08/31/18 0350 09/01/18 0340 09/02/18 0443 09/02/18 0943 09/03/18 0515 09/04/18 0558 09/05/18 0457  NA 138 139 140  --  140 140 140  K 4.5 4.8 5.3* 4.4 4.7 4.6 4.3  CL 105 108 111  --  110 109 108  CO2 25 22 23   --  22 23 26   GLUCOSE 124* 148* 92  --  96 94 104*  BUN 19 18 18   --  16 16 15   CREATININE 1.75* 1.75* 1.60*  --  1.34* 1.62* 1.59*  CALCIUM 8.2* 8.2* 8.4*  --  8.3* 8.2* 8.4*  MG 2.3  --   --   --   --   --   --    GFR: Estimated Creatinine Clearance: 58.4 mL/min (A) (by C-G formula based on SCr of 1.59 mg/dL (H)). Liver Function Tests: No results for input(s): AST, ALT, ALKPHOS, BILITOT, PROT, ALBUMIN in the last 168 hours. No results for  input(s): LIPASE, AMYLASE in the last 168 hours. No results for input(s): AMMONIA in the last 168 hours. Coagulation Profile: No results for input(s): INR, PROTIME in the last 168 hours. Cardiac Enzymes: No results for input(s): CKTOTAL, CKMB, CKMBINDEX, TROPONINI in the last 168 hours. BNP (last 3 results) No results for input(s): PROBNP in the last 8760 hours. HbA1C: No results for input(s): HGBA1C in the last 72 hours. CBG: No results for input(s): GLUCAP in the last 168 hours. Lipid Profile: No results for input(s): CHOL, HDL, LDLCALC, TRIG, CHOLHDL, LDLDIRECT in the last 72 hours. Thyroid Function Tests: No results for input(s): TSH, T4TOTAL, FREET4, T3FREE, THYROIDAB in the last 72 hours. Anemia Panel: No results for input(s): VITAMINB12, FOLATE, FERRITIN, TIBC, IRON, RETICCTPCT in the last 72 hours. Sepsis Labs: No results for input(s): PROCALCITON, LATICACIDVEN in the last 168 hours.  Recent Results (from the past 240 hour(s))  Culture, blood (single) w  Reflex to ID Panel     Status: None   Collection Time: 08/27/18  5:30 AM  Result Value Ref Range Status   Specimen Description BLOOD RIGHT ANTECUBITAL  Final   Special Requests   Final    BOTTLES DRAWN AEROBIC AND ANAEROBIC Blood Culture adequate volume Performed at [facility], [address] [place], [place] [address]    Culture NO GROWTH 5 DAYS  Final   Report Status 09/01/2018 FINAL  Final  Aerobic Culture (superficial specimen)     Status: None   Collection Time: 08/28/18  1:23 PM  Result Value Ref Range Status   Specimen Description   Final    GROIN LEFT Performed at [facility], [place] [address] [place], [place] [address]    Special Requests   Final    NONE Performed at [facility], [place] [address], [place], [place] [address]    Gram Stain   Final    FEW WBC PRESENT,BOTH PMN AND MONONUCLEAR FEW GRAM POSITIVE COCCI RARE GRAM VARIABLE ROD Performed at [facility], [place] [address], [place], [place] [address]    Culture FEW ESCHERICHIA COLI FEW STREPTOCOCCUS GROUP G   Final   Report Status 08/30/2018 FINAL  Final   Organism ID, Bacteria ESCHERICHIA COLI  Final      Susceptibility   Escherichia coli - MIC*    AMPICILLIN >=32 RESISTANT Resistant     CEFAZOLIN 8 SENSITIVE Sensitive     CEFEPIME <=1 SENSITIVE Sensitive     CEFTAZIDIME <=1 SENSITIVE Sensitive     CEFTRIAXONE <=1 SENSITIVE Sensitive     CIPROFLOXACIN <=0.25 SENSITIVE Sensitive     GENTAMICIN <=1 SENSITIVE Sensitive     IMIPENEM <=0.25 SENSITIVE Sensitive     TRIMETH/SULFA <=20 SENSITIVE Sensitive     AMPICILLIN/SULBACTAM >=32 RESISTANT Resistant     PIP/TAZO <=4 SENSITIVE Sensitive     Extended ESBL NEGATIVE Sensitive     * FEW ESCHERICHIA COLI  Aerobic/Anaerobic Culture (surgical/deep wound)     Status: None   Collection Time: 08/31/18 10:20 AM  Result Value Ref Range Status   Specimen Description   Final    SFT TISS OTH Performed at [place] [address], [place], [place] [address]    Special Requests   Final    NONE Performed at [facility], [place] [address], [place], [place] [address]    Gram Stain   Final    FEW WBC PRESENT, PREDOMINANTLY PMN NO ORGANISMS SEEN  Culture   Final    FEW ESCHERICHIA COLI CRITICAL RESULT CALLED TO, READ BACK BY AND VERIFIED WITH: C. HUGHEY, RN ([facility]) CONCERNING CULTURE GROWTH AT 1750 ON 09/02/18 BY C. JESSUP, MLT. RARE STREPTOCOCCUS GROUP F RARE PREVOTELLA SPECIES BETA LACTAMASE NEGATIVE Performed at [facility], [place] [address], [place], [place] [address]    Report Status 09/05/2018 FINAL  Final   Organism ID, Bacteria ESCHERICHIA COLI  Final      Susceptibility   Escherichia coli - MIC*    AMPICILLIN >=32 RESISTANT Resistant     CEFAZOLIN 8 SENSITIVE Sensitive     CEFEPIME <=1 SENSITIVE Sensitive     CEFTAZIDIME <=1 SENSITIVE Sensitive     CEFTRIAXONE <=1 SENSITIVE Sensitive     CIPROFLOXACIN <=0.25 SENSITIVE Sensitive     GENTAMICIN <=1 SENSITIVE Sensitive     IMIPENEM <=0.25 SENSITIVE Sensitive     TRIMETH/SULFA <=20 SENSITIVE Sensitive     AMPICILLIN/SULBACTAM >=32 RESISTANT Resistant     PIP/TAZO 8 SENSITIVE Sensitive     Extended ESBL NEGATIVE Sensitive     * FEW ESCHERICHIA COLI  Culture, blood (Routine X 2) w Reflex to ID Panel     Status: None (Preliminary result)   Collection Time: 09/01/18  9:30 AM  Result Value Ref Range Status   Specimen Description   Final    BLOOD LEFT HAND Performed at [place] [address], [place], [place] [address]    Special Requests   Final    BOTTLES DRAWN AEROBIC ONLY Blood Culture results may not be optimal due to an inadequate volume of blood received in culture bottles Performed at [place] [address] [place], [place] [address]    Culture   Final    NO GROWTH 3 DAYS Performed at [facility], [place] [address] [place], [place] [address]     Report Status PENDING  Incomplete  Culture, blood (Routine X 2) w Reflex to ID Panel     Status: None (Preliminary result)   Collection Time: 09/01/18  9:32 AM  Result Value Ref Range Status   Specimen Description   Final    BLOOD RIGHT HAND Performed at [place] [address] [place], [place] [address]    Special Requests   Final    BOTTLES DRAWN AEROBIC ONLY Blood Culture adequate volume Performed at [place] [address], [place], [place] [address]    Culture   Final    NO GROWTH 3 DAYS Performed at [facility], [place] [address], [place], [place] [address]    Report Status PENDING  Incomplete  Culture, Urine     Status: Abnormal   Collection Time: 09/01/18  3:23 PM  Result Value Ref Range Status   Specimen Description   Final    URINE, CLEAN CATCH Performed at [facility], [place] [address], [place], [place] [address]    Special Requests   Final    NONE Performed at [facility], [place] [address], [place],  [address]    Culture 20,000 COLONIES/mL ENTEROBACTER AEROGENES (A)  Final   Report Status 09/03/2018 FINAL  Final   Organism ID, Bacteria ENTEROBACTER AEROGENES (A)  Final      Susceptibility   Enterobacter aerogenes - MIC*    CEFAZOLIN >=64 RESISTANT Resistant     CEFTRIAXONE 16 INTERMEDIATE Intermediate     CIPROFLOXACIN <=0.25 SENSITIVE Sensitive  GENTAMICIN <=1 SENSITIVE Sensitive     IMIPENEM <=0.25 SENSITIVE Sensitive     NITROFURANTOIN 128 RESISTANT Resistant     TRIMETH/SULFA <=20 SENSITIVE Sensitive     PIP/TAZO >=128 RESISTANT Resistant     * 20,000 COLONIES/mL ENTEROBACTER AEROGENES         Radiology Studies: No results found.      Scheduled Meds: . allopurinol  100 mg Oral Daily  . amoxicillin-clavulanate  1 tablet Oral Q12H  . colchicine  0.6 mg Oral BID  . enoxaparin (LOVENOX) injection  40 mg Subcutaneous Q24H  . Influenza vac split  quadrivalent PF  0.5 mL Intramuscular Tomorrow-1000  . metoprolol tartrate  12.5 mg Oral BID  . polyethylene glycol  17 g Oral Daily  . spironolactone  25 mg Oral Daily  . sulfamethoxazole-trimethoprim  1 tablet Oral Q12H  . torsemide  20 mg Oral Daily   Continuous Infusions: . sodium chloride Stopped (08/31/18 0229)     LOS: 9 days    Time spent: 35 minutes    Irine Seal, MD [facility]  If 7PM-7AM, please contact night-coverage [email or web address] 09/05/2018, 12:57 PM

## 2018-09-05 NOTE — Plan of Care (Signed)
  RD consulted for nutrition education regarding weight loss.  Body mass index is 51.85 kg/m. Pt meets criteria for morbid obesity based on current BMI.  RD provided "Healthy Snacking", "Maintaining a healthy weight" handouts from the [employer] of [employer]. Emphasized the importance of serving sizes and provided examples of correct portions of common foods. Discussed importance of controlled and consistent intake throughout the day. Provided examples of ways to balance meals/snacks and encouraged intake of high-fiber, whole grain complex carbohydrates. Emphasized the importance of hydration with calorie-free beverages and limiting sugar-sweetened beverages. Encouraged pt to discuss physical activity options with physician. Teach back method used.  Expect good compliance based on interaction today.  Current diet order is regular, patient is consuming approximately 100% of meals at this time. Labs and medications reviewed. No further nutrition interventions warranted at this time. R If additional nutrition issues arise, please re-consult RD.  Clayton Bibles, MS, RD, [facility] Pager: [phone] After Hours Pager: [phone]

## 2018-09-05 NOTE — Progress Notes (Signed)
Patient ID: Caitlin Taylor, female   DOB: [birth year], 48 y.o.   MRN: [id number]         [facility]  Date of Admission:  08/27/2018   Total days of antibiotics 10        Day 7 piperacillin tazobactam         ASSESSMENT: She is improving on therapy for polymicrobial (E. coli, group F strep, group G strep and anaerobes) abscesses of her left groin and thigh.  I will change her to oral trimethoprim sulfamethoxazole and amoxicillin clavulanate and plan on 3 more days of therapy.  PLAN: 1. Change piperacillin tazobactam to oral trimethoprim sulfamethoxazole and amoxicillin clavulanate 2. I will sign off now but please call if I can be of further assistance while she is here  Principal Problem:   Abscess Active Problems:   Cellulitis of left groin   Essential hypertension   Morbid obesity due to excess calories (HCC)   CKD (chronic kidney disease), stage III ([place])   History of pulmonary embolus (PE)   Acute renal failure superimposed on stage 3 chronic kidney disease (HCC)   Scheduled Meds: . allopurinol  100 mg Oral Daily  . colchicine  0.6 mg Oral BID  . enoxaparin (LOVENOX) injection  40 mg Subcutaneous Q24H  . Influenza vac split quadrivalent PF  0.5 mL Intramuscular Tomorrow-1000  . metoprolol tartrate  12.5 mg Oral BID  . polyethylene glycol  17 g Oral Daily  . spironolactone  25 mg Oral Daily  . torsemide  20 mg Oral Daily   Continuous Infusions: . sodium chloride Stopped (08/31/18 0229)  . piperacillin-tazobactam (ZOSYN)  IV 3.375 g (09/05/18 0946)   PRN Meds:.sodium chloride, acetaminophen **OR** acetaminophen, morphine injection, ondansetron (ZOFRAN) IV, oxyCODONE   SUBJECTIVE: She is still having pain with dressing changes but overall says she is feeling much better.  Review of Systems: Review of Systems  Constitutional: Negative for chills, diaphoresis and fever.  Gastrointestinal: Negative for abdominal pain, diarrhea, nausea and  vomiting.    No Known Allergies  OBJECTIVE: Vitals:   09/04/18 0648 09/04/18 1357 09/04/18 2202 09/05/18 0621  BP: 118/79 126/79 133/78 132/88  Pulse: 75 73 78 78  Resp: 19 16 14 16   Temp: 98 F (36.7 C) 97.8 F (36.6 C) 98.6 F (37 C) 98.4 F (36.9 C)  TempSrc: Oral Oral Oral Oral  SpO2: 100% 96% 98% 96%  Weight:    132.8 kg  Height:       Body mass index is 51.85 kg/m.  Physical Exam Constitutional:      Comments: She has not been brushing her teeth.  She is smiling and in good spirits.  Musculoskeletal:     Comments: Her left groin and thigh wounds are looking better.  The cellulitis and induration around her left buttock is much improved.  Psychiatric:        Mood and Affect: Mood normal.     Lab Results Lab Results  Component Value Date   WBC 14.5 (H) 09/05/2018   HGB 10.5 (L) 09/05/2018   HCT 32.7 (L) 09/05/2018   MCV 96.7 09/05/2018   PLT 229 09/05/2018    Lab Results  Component Value Date   CREATININE 1.59 (H) 09/05/2018   BUN 15 09/05/2018   NA 140 09/05/2018   K 4.3 09/05/2018   CL 108 09/05/2018   CO2 26 09/05/2018    Lab Results  Component Value Date   ALT 38 08/28/2018  AST 20 08/28/2018   ALKPHOS 81 08/28/2018   BILITOT 0.9 08/28/2018     Microbiology: Recent Results (from the past 240 hour(s))  Culture, blood (single) w Reflex to ID Panel     Status: None   Collection Time: 08/27/18  5:30 AM  Result Value Ref Range Status   Specimen Description BLOOD RIGHT ANTECUBITAL  Final   Special Requests   Final    BOTTLES DRAWN AEROBIC AND ANAEROBIC Blood Culture adequate volume Performed at [facility], [place] [place], [place] [address]    Culture NO GROWTH 5 DAYS  Final   Report Status 09/01/2018 FINAL  Final  Aerobic Culture (superficial specimen)     Status: None   Collection Time: 08/28/18  1:23 PM  Result Value Ref Range Status   Specimen Description   Final    GROIN LEFT Performed at [place], [place] [address] [place], [place] [address]    Special Requests   Final    NONE Performed at [facility], [place] [address], [place], [place] [address]    Gram Stain   Final    FEW WBC PRESENT,BOTH PMN AND MONONUCLEAR FEW GRAM POSITIVE COCCI RARE GRAM VARIABLE ROD Performed at [facility], [place] [address], [place], [place] [address]    Culture FEW ESCHERICHIA COLI FEW STREPTOCOCCUS GROUP G   Final   Report Status 08/30/2018 FINAL  Final   Organism ID, Bacteria ESCHERICHIA COLI  Final      Susceptibility   Escherichia coli - MIC*    AMPICILLIN >=32 RESISTANT Resistant     CEFAZOLIN 8 SENSITIVE Sensitive     CEFEPIME <=1 SENSITIVE Sensitive     CEFTAZIDIME <=1 SENSITIVE Sensitive     CEFTRIAXONE <=1 SENSITIVE Sensitive     CIPROFLOXACIN <=0.25 SENSITIVE Sensitive     GENTAMICIN <=1 SENSITIVE Sensitive     IMIPENEM <=0.25 SENSITIVE Sensitive     TRIMETH/SULFA <=20 SENSITIVE Sensitive     AMPICILLIN/SULBACTAM >=32 RESISTANT Resistant     PIP/TAZO <=4 SENSITIVE Sensitive     Extended ESBL NEGATIVE Sensitive     * FEW ESCHERICHIA COLI  Aerobic/Anaerobic Culture (surgical/deep wound)     Status: None (Preliminary result)   Collection Time: 08/31/18 10:20 AM  Result Value Ref Range Status   Specimen Description   Final    SFT TISS OTH Performed at [facility] [address], [place], [place] [address]    Special Requests   Final    NONE Performed at [facility], [place] [address], [place], [place] [address]    Gram Stain   Final    FEW WBC PRESENT, PREDOMINANTLY PMN NO ORGANISMS SEEN    Culture   Final    FEW ESCHERICHIA COLI CRITICAL RESULT CALLED TO, READ BACK BY AND VERIFIED WITH: C. HUGHEY, RN ([facility]) CONCERNING CULTURE GROWTH AT 1750 ON 09/02/18 BY C. JESSUP, MLT. RARE STREPTOCOCCUS GROUP F HOLDING FOR POSSIBLE ANAEROBE Performed at [facility], [place] [address], [place], [place] [address]    Report Status  PENDING  Incomplete   Organism ID, Bacteria ESCHERICHIA COLI  Final      Susceptibility   Escherichia coli - MIC*    AMPICILLIN >=32 RESISTANT Resistant     CEFAZOLIN 8 SENSITIVE Sensitive     CEFEPIME <=1 SENSITIVE Sensitive     CEFTAZIDIME <=1 SENSITIVE Sensitive     CEFTRIAXONE <=1 SENSITIVE Sensitive  CIPROFLOXACIN <=0.25 SENSITIVE Sensitive     GENTAMICIN <=1 SENSITIVE Sensitive     IMIPENEM <=0.25 SENSITIVE Sensitive     TRIMETH/SULFA <=20 SENSITIVE Sensitive     AMPICILLIN/SULBACTAM >=32 RESISTANT Resistant     PIP/TAZO 8 SENSITIVE Sensitive     Extended ESBL NEGATIVE Sensitive     * FEW ESCHERICHIA COLI  Culture, blood (Routine X 2) w Reflex to ID Panel     Status: None (Preliminary result)   Collection Time: 09/01/18  9:30 AM  Result Value Ref Range Status   Specimen Description   Final    BLOOD LEFT HAND Performed at [facility] [address] [place], [place] [address]    Special Requests   Final    BOTTLES DRAWN AEROBIC ONLY Blood Culture results may not be optimal due to an inadequate volume of blood received in culture bottles Performed at [facility] [address] [place], [place] [address]    Culture   Final    NO GROWTH 3 DAYS Performed at [facility], [place] [address], [place], [place] [address]    Report Status PENDING  Incomplete  Culture, blood (Routine X 2) w Reflex to ID Panel     Status: None (Preliminary result)   Collection Time: 09/01/18  9:32 AM  Result Value Ref Range Status   Specimen Description   Final    BLOOD RIGHT HAND Performed at [facility] [address] [place], [place] [address]    Special Requests   Final    BOTTLES DRAWN AEROBIC ONLY Blood Culture adequate volume Performed at [facility] [address], [place], [place] [address]    Culture   Final    NO GROWTH 3 DAYS Performed at [facility], [place] [address] [place],  [place] [address]    Report Status PENDING  Incomplete  Culture, Urine     Status: Abnormal   Collection Time: 09/01/18  3:23 PM  Result Value Ref Range Status   Specimen Description   Final    URINE, CLEAN CATCH Performed at [name], [place] [address] [place], [place] [address]    Special Requests   Final    NONE Performed at [facility], [place] [address], [place], [place] [address]    Culture 20,000 COLONIES/mL ENTEROBACTER AEROGENES (A)  Final   Report Status 09/03/2018 FINAL  Final   Organism ID, Bacteria ENTEROBACTER AEROGENES (A)  Final      Susceptibility   Enterobacter aerogenes - MIC*    CEFAZOLIN >=64 RESISTANT Resistant     CEFTRIAXONE 16 INTERMEDIATE Intermediate     CIPROFLOXACIN <=0.25 SENSITIVE Sensitive     GENTAMICIN <=1 SENSITIVE Sensitive     IMIPENEM <=0.25 SENSITIVE Sensitive     NITROFURANTOIN 128 RESISTANT Resistant     TRIMETH/SULFA <=20 SENSITIVE Sensitive     PIP/TAZO >=128 RESISTANT Resistant     * 20,000 COLONIES/mL ENTEROBACTER AEROGENES    Michel Bickers, [place] [facility] [phone] pager   [phone] cell 09/05/2018, 9:55 AM

## 2018-09-06 LAB — BASIC METABOLIC PANEL
ANION GAP: 7 (ref 5–15)
BUN: 13 mg/dL (ref 6–20)
CO2: 24 mmol/L (ref 22–32)
Calcium: 8.2 mg/dL — ABNORMAL LOW (ref 8.9–10.3)
Chloride: 108 mmol/L (ref 98–111)
Creatinine, Ser: 1.57 mg/dL — ABNORMAL HIGH (ref 0.44–1.00)
GFR calc Af Amer: 45 mL/min — ABNORMAL LOW (ref >60)
GFR calc non Af Amer: 39 mL/min — ABNORMAL LOW (ref >60)
Glucose, Bld: 104 mg/dL — ABNORMAL HIGH (ref 70–99)
POTASSIUM: 4.3 mmol/L (ref 3.5–5.1)
Sodium: 139 mmol/L (ref 135–145)

## 2018-09-06 LAB — CBC WITH DIFFERENTIAL/PLATELET
ABS IMMATURE GRANULOCYTES: 0.1 10*3/uL — AB (ref 0.00–0.07)
Basophils Absolute: 0 10*3/uL (ref 0.0–0.1)
Basophils Relative: 0 %
Eosinophils Absolute: 0.3 10*3/uL (ref 0.0–0.5)
Eosinophils Relative: 2 %
HCT: 32.8 % — ABNORMAL LOW (ref 36.0–46.0)
Hemoglobin: 10 g/dL — ABNORMAL LOW (ref 12.0–15.0)
Lymphocytes Relative: 9 %
Lymphs Abs: 1.2 10*3/uL (ref 0.7–4.0)
MCH: 30 pg (ref 26.0–34.0)
MCHC: 30.5 g/dL (ref 30.0–36.0)
MCV: 98.5 fL (ref 80.0–100.0)
MONOS PCT: 2 %
Metamyelocytes Relative: 1 %
Monocytes Absolute: 0.3 10*3/uL (ref 0.1–1.0)
Neutro Abs: 11.6 10*3/uL — ABNORMAL HIGH (ref 1.7–7.7)
Neutrophils Relative %: 86 %
Platelets: 257 10*3/uL (ref 150–400)
RBC: 3.33 MIL/uL — ABNORMAL LOW (ref 3.87–5.11)
RDW: 14.7 % (ref 11.5–15.5)
WBC: 13.5 10*3/uL — ABNORMAL HIGH (ref 4.0–10.5)
nRBC: 0 % (ref 0.0–0.2)

## 2018-09-06 LAB — CULTURE, BLOOD (ROUTINE X 2)
CULTURE: NO GROWTH
Culture: NO GROWTH
Special Requests: ADEQUATE

## 2018-09-06 NOTE — Progress Notes (Signed)
[facility]                                                                              Patient Demographics  Caitlin Taylor, is a 48 y.o. female, DOB - 1971-03-26, ZHY:[id number]  Admit date - 08/27/2018   Admitting Physician Debbe Odea, MD  Outpatient Primary MD for the patient is [name], MD  Outpatient specialists:   LOS - 10  days   Medical records reviewed and are as summarized below:    Chief Complaint  Patient presents with  . Abdominal Pain       Brief summary   Caitlin A Townsendis a 48 y.o.femalewith medical history significant ofDVT and PE, hypertension, chronic kidney disease, obesity presenting with left groin pain and cellulitis.  General surgery consulted and patient underwent incision and drainage x2.  Patient was placed on IV Zosyn and has been transitioned to oral Bactrim and Augmentin.  ID also consulted and following.  Assessment & Plan   Left groin abscess and cellulitis  -Status post I&D bedside of the left groin abscess on 2/17, cultures grew out E. Coli. -IV antibiotics were changed to IV Zosyn per general surgery -Underwent I&D of left thigh abscess and left groin abscess with cysts cultures sent off on 2/20 -Initial cultures from bedside I&D showed E. coli and Streptococcus G, cultures from I&D on 2/20 showed E. coli and Streptococcus group F. -ID was consulted, patient was seen by Dr. Bridget Hartshorn, IV Zosyn has been transitioned to oral Bactrim and oral Augmentin to complete course of 10 to 14 days. -Wound inspected with general surgery during the dressing change.  Recommended to keep patient 1 more day to teach the dressing changes, patient requesting home health RN -WBC count trending down  Acute kidney injury on CKD stage III -IV vancomycin has been discontinued, creatinine plateaued at 2.4 -Now creatinine improving 1.5, Spironolactone and torsemide has been recently resumed  Essential hypertension -Initially  Aldactone and torsemide were held, now resumed, continue metoprolol  Left adrenal mass Incidentally noted on CT, will need outpatient follow-up with MRI and further work-up  History of gout Continue colchicine, allopurinol   Code Status: Full code DVT Prophylaxis:  Lovenox  Family Communication: Discussed in detail with the patient, all imaging results, lab results explained to the patient    Disposition Plan: DC home in a.m.  Time Spent in minutes 35 minutes  Procedures:   CT abdomen and pelvis 08/27/2018  Bedside I&D 08/28/2018 per general surgery--Kelly Rayburn, PA general surgery  Incision and drainage abscess left thigh and left groin per Dr. Ninfa Linden 08/31/2018   Chest x-ray 09/01/2018   Consultants:    General surgery: Dr. Ninfa Linden 08/28/2018  Infectious disease: Dr. Megan Salon 09/01/2018   Antimicrobials:   Anti-infectives (From admission, onward)   Start     Dose/Rate Route Frequency Ordered Stop   09/05/18 1030  sulfamethoxazole-trimethoprim (BACTRIM DS,SEPTRA DS) 800-160 MG per tablet 1 tablet     1 tablet Oral Every 12 hours 09/05/18 1001     09/05/18 1030  amoxicillin-clavulanate (AUGMENTIN) 875-125 MG per tablet 1 tablet     1 tablet Oral Every 12  hours 09/05/18 1001     08/30/18 2000  doxycycline (VIBRAMYCIN) 100 mg in sodium chloride 0.9 % 250 mL IVPB  Status:  Discontinued     100 mg 125 mL/hr over 120 Minutes Intravenous Every 12 hours 08/30/18 1944 09/01/18 1600   08/30/18 1000  piperacillin-tazobactam (ZOSYN) IVPB 3.375 g  Status:  Discontinued     3.375 g 12.5 mL/hr over 240 Minutes Intravenous Every 8 hours 08/30/18 0928 09/05/18 1001   08/28/18 1100  vancomycin (VANCOCIN) IVPB 750 mg/150 ml premix  Status:  Discontinued     750 mg 150 mL/hr over 60 Minutes Intravenous Every 24 hours 08/27/18 1225 08/28/18 1039   08/28/18 1045  vancomycin variable dose per unstable renal function (pharmacist dosing)  Status:  Discontinued      Does not apply See  admin instructions 08/28/18 1045 08/28/18 1331   08/27/18 1800  cefTRIAXone (ROCEPHIN) 2 g in sodium chloride 0.9 % 100 mL IVPB  Status:  Discontinued     2 g 200 mL/hr over 30 Minutes Intravenous Every 24 hours 08/27/18 0759 08/30/18 0923   08/27/18 0900  vancomycin (VANCOCIN) 2,000 mg in sodium chloride 0.9 % 500 mL IVPB     2,000 mg 250 mL/hr over 120 Minutes Intravenous  Once 08/27/18 0811 08/27/18 1111   08/27/18 0515  cefTRIAXone (ROCEPHIN) 1 g in sodium chloride 0.9 % 100 mL IVPB     1 g 200 mL/hr over 30 Minutes Intravenous  Once 08/27/18 0502 08/27/18 0603   08/27/18 0500  vancomycin (VANCOCIN) IVPB 1000 mg/200 mL premix  Status:  Discontinued     1,000 mg 200 mL/hr over 60 Minutes Intravenous  Once 08/27/18 0446 08/27/18 0810          Medications  Scheduled Meds: . allopurinol  100 mg Oral Daily  . amoxicillin-clavulanate  1 tablet Oral Q12H  . colchicine  0.6 mg Oral BID  . enoxaparin (LOVENOX) injection  40 mg Subcutaneous Q24H  . Influenza vac split quadrivalent PF  0.5 mL Intramuscular Tomorrow-1000  . metoprolol tartrate  12.5 mg Oral BID  . polyethylene glycol  17 g Oral Daily  . spironolactone  25 mg Oral Daily  . sulfamethoxazole-trimethoprim  1 tablet Oral Q12H  . torsemide  20 mg Oral Daily   Continuous Infusions: . sodium chloride Stopped (08/31/18 0229)   PRN Meds:.sodium chloride, acetaminophen **OR** acetaminophen, morphine injection, ondansetron (ZOFRAN) IV, oxyCODONE      Subjective:   Caitlin Taylor was seen and examined today.  Patient denies dizziness, chest pain, shortness of breath, abdominal pain, N/V/D/C, new weakness, numbess, tingling. No acute events overnight.  Wound improving.  Objective:   Vitals:   09/05/18 1439 09/05/18 2115 09/06/18 0550 09/06/18 1431  BP: 138/80 121/70 124/84 139/84  Pulse: 86 77 71 84  Resp: 16 16 17 18   Temp: (!) 97.5 F (36.4 C) 97.9 F (36.6 C) 98.2 F (36.8 C) 98.1 F (36.7 C)  TempSrc: Oral  Oral Oral Oral  SpO2: 98% 97% 97% 98%  Weight:      Height:        Intake/Output Summary (Last 24 hours) at 09/06/2018 1556 Last data filed at 09/06/2018 1435 Gross per 24 hour  Intake -  Output 1400 ml  Net -1400 ml     Wt Readings from Last 3 Encounters:  09/05/18 132.8 kg  06/26/18 125.2 kg  11/17/17 121.1 kg     Exam  General: Alert and oriented x 3, NAD  Eyes:  HEENT:  Atraumatic, normocephalic  Cardiovascular: S1 S2 auscultated, Regular rate and rhythm.  Respiratory: Clear to auscultation bilaterally, no wheezing, rales or rhonchi  Gastrointestinal: Soft, nontender, nondistended, + bowel sounds  Ext: no pedal edema bilaterally  Neuro: No new deficits  Musculoskeletal: No digital cyanosis, clubbing  Skin: Left groin with open wound, no purulent discharge, decreasing surrounding erythema and swelling  Psych: Normal affect and demeanor, alert and oriented x3    Data Reviewed:  I have personally reviewed following labs and imaging studies  Micro Results Recent Results (from the past 240 hour(s))  Aerobic Culture (superficial specimen)     Status: None   Collection Time: 08/28/18  1:23 PM  Result Value Ref Range Status   Specimen Description   Final    GROIN LEFT Performed at [facility], [address], [place], [place] [address]    Special Requests   Final    NONE Performed at [facility], [place] [address] [place], [place] [address]    Gram Stain   Final    FEW WBC PRESENT,BOTH PMN AND MONONUCLEAR FEW GRAM POSITIVE COCCI RARE GRAM VARIABLE ROD Performed at [facility], [place] [address], [place], [place] [address]    Culture FEW ESCHERICHIA COLI FEW STREPTOCOCCUS GROUP G   Final   Report Status 08/30/2018 FINAL  Final   Organism ID, Bacteria ESCHERICHIA COLI  Final      Susceptibility   Escherichia coli - MIC*    AMPICILLIN >=32 RESISTANT Resistant     CEFAZOLIN 8 SENSITIVE Sensitive     CEFEPIME <=1  SENSITIVE Sensitive     CEFTAZIDIME <=1 SENSITIVE Sensitive     CEFTRIAXONE <=1 SENSITIVE Sensitive     CIPROFLOXACIN <=0.25 SENSITIVE Sensitive     GENTAMICIN <=1 SENSITIVE Sensitive     IMIPENEM <=0.25 SENSITIVE Sensitive     TRIMETH/SULFA <=20 SENSITIVE Sensitive     AMPICILLIN/SULBACTAM >=32 RESISTANT Resistant     PIP/TAZO <=4 SENSITIVE Sensitive     Extended ESBL NEGATIVE Sensitive     * FEW ESCHERICHIA COLI  Aerobic/Anaerobic Culture (surgical/deep wound)     Status: None   Collection Time: 08/31/18 10:20 AM  Result Value Ref Range Status   Specimen Description   Final    SFT TISS OTH Performed at [facility] [address], [place], [place] [address]    Special Requests   Final    NONE Performed at [facility], [place] [address], [place],  [address]    Gram Stain   Final    FEW WBC PRESENT, PREDOMINANTLY PMN NO ORGANISMS SEEN    Culture   Final    FEW ESCHERICHIA COLI CRITICAL RESULT CALLED TO, READ BACK BY AND VERIFIED WITH: C. HUGHEY, RN ([facility]) CONCERNING CULTURE GROWTH AT 1750 ON 09/02/18 BY C. JESSUP, MLT. RARE STREPTOCOCCUS GROUP F RARE PREVOTELLA SPECIES BETA LACTAMASE NEGATIVE Performed at [facility], [place] [address] [place],  [address]    Report Status 09/05/2018 FINAL  Final   Organism ID, Bacteria ESCHERICHIA COLI  Final      Susceptibility   Escherichia coli - MIC*    AMPICILLIN >=32 RESISTANT Resistant     CEFAZOLIN 8 SENSITIVE Sensitive     CEFEPIME <=1 SENSITIVE Sensitive     CEFTAZIDIME <=1 SENSITIVE Sensitive     CEFTRIAXONE <=1 SENSITIVE Sensitive     CIPROFLOXACIN <=0.25 SENSITIVE Sensitive     GENTAMICIN <=1 SENSITIVE Sensitive  IMIPENEM <=0.25 SENSITIVE Sensitive     TRIMETH/SULFA <=20 SENSITIVE Sensitive     AMPICILLIN/SULBACTAM >=32 RESISTANT Resistant     PIP/TAZO 8 SENSITIVE Sensitive     Extended ESBL NEGATIVE Sensitive     * FEW ESCHERICHIA COLI  Culture, blood (Routine X  2) w Reflex to ID Panel     Status: None   Collection Time: 09/01/18  9:30 AM  Result Value Ref Range Status   Specimen Description   Final    BLOOD LEFT HAND Performed at [facility] [address], [place], [place] [address]    Special Requests   Final    BOTTLES DRAWN AEROBIC ONLY Blood Culture results may not be optimal due to an inadequate volume of blood received in culture bottles Performed at [facility] [address], [place], [place] [address]    Culture   Final    NO GROWTH 5 DAYS Performed at [facility], [place] [address] [place], [place] [address]    Report Status 09/06/2018 FINAL  Final  Culture, blood (Routine X 2) w Reflex to ID Panel     Status: None   Collection Time: 09/01/18  9:32 AM  Result Value Ref Range Status   Specimen Description   Final    BLOOD RIGHT HAND Performed at [facility] [address] [place], [place] [address]    Special Requests   Final    BOTTLES DRAWN AEROBIC ONLY Blood Culture adequate volume Performed at [facility] [address] [place], [place] [address]    Culture   Final    NO GROWTH 5 DAYS Performed at [facility], [place] [address], [place], [place] [address]    Report Status 09/06/2018 FINAL  Final  Culture, Urine     Status: Abnormal   Collection Time: 09/01/18  3:23 PM  Result Value Ref Range Status   Specimen Description   Final    URINE, CLEAN CATCH Performed at [facility], [place] [address], [place], [place] [address]    Special Requests   Final    NONE Performed at [facility], [place] [address] [place],  [address]    Culture 20,000 COLONIES/mL ENTEROBACTER AEROGENES (A)  Final   Report Status 09/03/2018 FINAL  Final   Organism ID, Bacteria ENTEROBACTER AEROGENES (A)  Final      Susceptibility   Enterobacter aerogenes - MIC*    CEFAZOLIN >=64 RESISTANT Resistant      CEFTRIAXONE 16 INTERMEDIATE Intermediate     CIPROFLOXACIN <=0.25 SENSITIVE Sensitive     GENTAMICIN <=1 SENSITIVE Sensitive     IMIPENEM <=0.25 SENSITIVE Sensitive     NITROFURANTOIN 128 RESISTANT Resistant     TRIMETH/SULFA <=20 SENSITIVE Sensitive     PIP/TAZO >=128 RESISTANT Resistant     * 20,000 COLONIES/mL ENTEROBACTER AEROGENES    Radiology Reports Dg Chest 2 View  Result Date: 09/01/2018 CLINICAL DATA:  Shortness of breath.  Leukocytosis. EXAM: CHEST - 2 VIEW COMPARISON:  Chest CT 09/03/2016 FINDINGS: Cardiomediastinal silhouette is normal. Mediastinal contours appear intact. There is no evidence of focal airspace consolidation, pleural effusion or pneumothorax. Osseous structures are without acute abnormality. Soft tissues are grossly normal. IMPRESSION: No active cardiopulmonary disease. Electronically Signed   By: Fidela Salisbury M.D.   On: 09/01/2018 11:12   Ct Abdomen Pelvis W Contrast  Addendum Date: 08/27/2018   ADDENDUM REPORT: 08/27/2018 04:32 ADDENDUM: Add to IMPRESSION:  Thickened urinary bladder wall, a finding felt to be indicative of a degree of cystitis. Electronically Signed   By: Lowella Grip III M.D.   On: 08/27/2018 04:32   Result Date: 08/27/2018 CLINICAL DATA:  Abdominal pain, primarily left-sided EXAM: CT ABDOMEN AND PELVIS WITH CONTRAST TECHNIQUE: Multidetector CT imaging of the abdomen and pelvis was performed using the standard protocol following bolus administration of intravenous contrast. CONTRAST:  36mL ISOVUE-300 IOPAMIDOL (ISOVUE-300) INJECTION 61% COMPARISON:  None. FINDINGS: Lower chest: There is bibasilar atelectatic change. Hepatobiliary: No focal liver lesions are evident. Gallbladder appears contracted. No biliary duct dilatation evident. Pancreas: No pancreatic mass or inflammatory focus. Spleen: No splenic lesions are evident. Adrenals/Urinary Tract: Right adrenal appears normal. There is a mass arising from the left adrenal measuring 2.3 x  2.2 cm. The attenuation of this mass does not indicate that it represents an adenoma. There is a 4 mm probable cyst in the upper pole left kidney. There is no evident hydronephrosis on either side. There is no evident renal or ureteral calculus on either side. The urinary bladder wall is thickened. Stomach/Bowel: The rectum is borderline distended with stool. There is no bowel wall or mesenteric thickening. No diverticulitis evident. There is no bowel obstruction. There is no evident free air or portal venous air. There is lipomatous infiltration of the ileocecal valve. Vascular/Lymphatic: There is no abdominal aortic aneurysm. No vascular lesions are appreciable. There is no adenopathy in the abdomen or pelvis. Reproductive: The uterus is anteverted.  No pelvic mass is evident. Other: Appendix appears unremarkable. No abscess or ascites is evident in the peritoneum or retroperitoneum of the abdomen and pelvis. There is a small ventral hernia containing only fat. There is soft tissue stranding in the left inguinal region. There is ill-defined fluid in the subcutaneous region of the left inguinal region this ill-defined collection measures 3.8 x 3.6 x 3.3 cm. Musculoskeletal: There is postoperative change in the lumbar spine at L4 and L5. There is degenerative change in the lower lumbar spine. There are no blastic or lytic bone lesions. No intramuscular lesions are evident. IMPRESSION: 1. There is inflammatory change with apparent developing phlegmon in the medial upper left thigh/left inguinal region. No air is seen in this area. 2. No evident diverticulitis. No bowel obstruction or bowel wall thickening. No abscess within the peritoneum or retroperitoneum of the abdomen and pelvis. Appendix appears normal. 3. There is a left adrenal mass measuring 2.3 x 2.2 cm. Etiology uncertain. Nonemergent adrenal MR to further evaluate this mass pre and post-contrast may well be advisable. 4.  No evident renal or ureteral  calculus.  No hydronephrosis. 5.  Postoperative change at L4 and L5. Electronically Signed: By: Lowella Grip III M.D. On: 08/27/2018 04:25    Lab Data:  CBC: Recent Labs  Lab 09/02/18 0443 09/03/18 0515 09/04/18 0558 09/05/18 0457 09/06/18 0359  WBC 19.8* 18.3* 19.1* 14.5* 13.5*  NEUTROABS 15.8* 14.9* 16.0* 11.8* 11.6*  HGB 10.0* 10.3* 10.0* 10.5* 10.0*  HCT 31.7* 32.4* 31.4* 32.7* 32.8*  MCV 96.9 96.1 96.3 96.7 98.5  PLT 177 187 204 229 094   Basic Metabolic Panel: Recent Labs  Lab 08/31/18 0350  09/02/18 0443 09/02/18 0943 09/03/18 0515 09/04/18 0558 09/05/18 0457 09/06/18 0359  NA 138   < > 140  --  140 140 140 139  K 4.5   < > 5.3* 4.4 4.7 4.6 4.3 4.3  CL 105   < > 111  --  110 109 108  108  CO2 25   < > 23  --  22 23 26 24   GLUCOSE 124*   < > 92  --  96 94 104* 104*  BUN 19   < > 18  --  16 16 15 13   CREATININE 1.75*   < > 1.60*  --  1.34* 1.62* 1.59* 1.57*  CALCIUM 8.2*   < > 8.4*  --  8.3* 8.2* 8.4* 8.2*  MG 2.3  --   --   --   --   --   --   --    < > = values in this interval not displayed.   GFR: Estimated Creatinine Clearance: 59.2 mL/min (A) (by C-G formula based on SCr of 1.57 mg/dL (H)). Liver Function Tests: No results for input(s): AST, ALT, ALKPHOS, BILITOT, PROT, ALBUMIN in the last 168 hours. No results for input(s): LIPASE, AMYLASE in the last 168 hours. No results for input(s): AMMONIA in the last 168 hours. Coagulation Profile: No results for input(s): INR, PROTIME in the last 168 hours. Cardiac Enzymes: No results for input(s): CKTOTAL, CKMB, CKMBINDEX, TROPONINI in the last 168 hours. BNP (last 3 results) No results for input(s): PROBNP in the last 8760 hours. HbA1C: No results for input(s): HGBA1C in the last 72 hours. CBG: No results for input(s): GLUCAP in the last 168 hours. Lipid Profile: No results for input(s): CHOL, HDL, LDLCALC, TRIG, CHOLHDL, LDLDIRECT in the last 72 hours. Thyroid Function Tests: No results for  input(s): TSH, T4TOTAL, FREET4, T3FREE, THYROIDAB in the last 72 hours. Anemia Panel: No results for input(s): VITAMINB12, FOLATE, FERRITIN, TIBC, IRON, RETICCTPCT in the last 72 hours. Urine analysis:    Component Value Date/Time   COLORURINE YELLOW 09/01/2018 1523   APPEARANCEUR HAZY (A) 09/01/2018 1523   LABSPEC 1.016 09/01/2018 1523   PHURINE 5.0 09/01/2018 1523   GLUCOSEU NEGATIVE 09/01/2018 1523   HGBUR SMALL (A) 09/01/2018 1523   BILIRUBINUR NEGATIVE 09/01/2018 1523   KETONESUR NEGATIVE 09/01/2018 1523   PROTEINUR 30 (A) 09/01/2018 1523   NITRITE NEGATIVE 09/01/2018 1523   LEUKOCYTESUR TRACE (A) 09/01/2018 1523     Ripudeep Rai M.D. [facility] 09/06/2018, 3:56 PM  Pager: [phone] Between 7am to 7pm - call Pager - [phone]  After 7pm go to [email or web address] - password [email or web address]  Call night coverage person covering after 7pm

## 2018-09-06 NOTE — Discharge Instructions (Addendum)
WOUND CARE: - dressing to be changed two times daily - supplies: sterile saline, kerlex, scissors, ABD pads, tape  - remove dressing and all packing carefully from all 3 wounds, moistening with sterile saline as needed to avoid packing/internal dressing sticking to the wound. - dampen a clean kerlix with sterile saline and pack wounds from wound base to skin level, making sure to take note of any possible areas of wound tracking, tunneling and packing appropriately. Wound can be packed loosely. Trim kerlix to size if a whole kerlix is not required. - cover wound with a dry ABD pad and secure with tape.  - apply any skin protectant/powder recommended by clinician to protect skin/skin folds. - patient may shower daily with wound open and following the shower the wound should be dried and a clean dressing placed.    Incision and Drainage, Care After Refer to this sheet in the next few weeks. These instructions provide you with information about caring for yourself after your procedure. Your health care provider may also give you more specific instructions. Your treatment has been planned according to current medical practices, but problems sometimes occur. Call your health care provider if you have any problems or questions after your procedure. What can I expect after the procedure? After the procedure, it is common to have:  Pain or discomfort around your incision site.  Drainage from your incision. Follow these instructions at home:  Take over-the-counter and prescription medicines only as told by your health care provider.  If you were prescribed an antibiotic medicine, take it as told by your health care provider.Do not stop taking the antibiotic even if you start to feel better.  Followinstructions from your health care provider about: ? How to take care of your incision. ? When and how you should change your packing and bandage (dressing). Wash your hands with soap and water before  you change your dressing. If soap and water are not available, use hand sanitizer. ? When you should remove your dressing.  Do not take baths, swim, or use a hot tub until your health care provider approves.  Keep all follow-up visits as told by your health care provider. This is important.  Check your incision area every day for signs of infection. Check for: ? More redness, swelling, or pain. ? More fluid or blood. ? Warmth. ? Pus or a bad smell. Contact a health care provider if:  Your cyst or abscess returns.  You have a fever.  You have more redness, swelling, or pain around your incision.  You have more fluid or blood coming from your incision.  Your incision feels warm to the touch.  You have pus or a bad smell coming from your incision. Get help right away if:  You have severe pain or bleeding.  You cannot eat or drink without vomiting.  You have decreased urine output.  You become short of breath.  You have chest pain.  You cough up blood.  The area where the incision and drainage occurred becomes numb or it tingles. This information is not intended to replace advice given to you by your health care provider. Make sure you discuss any questions you have with your health care provider. Document Released: 09/20/2011 Document Revised: 11/28/2015 Document Reviewed: 04/18/2015 Elsevier Interactive Patient Education  2019 [employer].

## 2018-09-06 NOTE — Progress Notes (Signed)
[facility] Progress Note  6 Days Post-Op  Subjective: CC-  Comfortable this morning. Going to try dressing change with only oral medications. Thinks her daughter may be able to help with dressing changes at home.  Objective: Vital signs in last 24 hours: Temp:  [97.5 F (36.4 C)-98.2 F (36.8 C)] 98.2 F (36.8 C) (02/26 0550) Pulse Rate:  [71-86] 71 (02/26 0550) Resp:  [16-17] 17 (02/26 0550) BP: (121-138)/(70-84) 124/84 (02/26 0550) SpO2:  [97 %-98 %] 97 % (02/26 0550) Last BM Date: 09/03/18  Intake/Output from previous day: 02/25 0701 - 02/26 0700 In: 480 [P.O.:480] Out: 1300 [Urine:1300] Intake/Output this shift: No intake/output data recorded.  PE: Gen: Alert, NAD, pleasant HEENT: EOM's intact, pupils equal and round Pulm: effort normal Abd: Soft, NT/ND, +BS Psych: A&Ox3  Skin: warm and dry Ext: L groin wound pink with some yellow slough, still with surround cellulitis but this is improving, no need tunnels/tracks    More medial wound mostly pink with some purulent drainage specifically at the medial aspect, wound does not probe deep     Lab Results:  Recent Labs    09/05/18 0457 09/06/18 0359  WBC 14.5* 13.5*  HGB 10.5* 10.0*  HCT 32.7* 32.8*  PLT 229 257   BMET Recent Labs    09/05/18 0457 09/06/18 0359  NA 140 139  K 4.3 4.3  CL 108 108  CO2 26 24  GLUCOSE 104* 104*  BUN 15 13  CREATININE 1.59* 1.57*  CALCIUM 8.4* 8.2*   PT/INR No results for input(s): LABPROT, INR in the last 72 hours. CMP     Component Value Date/Time   NA 139 09/06/2018 0359   K 4.3 09/06/2018 0359   CL 108 09/06/2018 0359   CO2 24 09/06/2018 0359   GLUCOSE 104 (H) 09/06/2018 0359   BUN 13 09/06/2018 0359   CREATININE 1.57 (H) 09/06/2018 0359   CALCIUM 8.2 (L) 09/06/2018 0359   PROT 5.9 (L) 08/28/2018 0357   ALBUMIN 2.9 (L) 08/28/2018 0357   AST 20 08/28/2018 0357   ALT 38 08/28/2018 0357   ALKPHOS 81 08/28/2018 0357   BILITOT 0.9  08/28/2018 0357   GFRNONAA 39 (L) 09/06/2018 0359   GFRAA 45 (L) 09/06/2018 0359   Lipase  No results found for: LIPASE     Studies/Results: No results found.  Anti-infectives: Anti-infectives (From admission, onward)   Start     Dose/Rate Route Frequency Ordered Stop   09/05/18 1030  sulfamethoxazole-trimethoprim (BACTRIM DS,SEPTRA DS) 800-160 MG per tablet 1 tablet     1 tablet Oral Every 12 hours 09/05/18 1001     09/05/18 1030  amoxicillin-clavulanate (AUGMENTIN) 875-125 MG per tablet 1 tablet     1 tablet Oral Every 12 hours 09/05/18 1001     08/30/18 2000  doxycycline (VIBRAMYCIN) 100 mg in sodium chloride 0.9 % 250 mL IVPB  Status:  Discontinued     100 mg 125 mL/hr over 120 Minutes Intravenous Every 12 hours 08/30/18 1944 09/01/18 1600   08/30/18 1000  piperacillin-tazobactam (ZOSYN) IVPB 3.375 g  Status:  Discontinued     3.375 g 12.5 mL/hr over 240 Minutes Intravenous Every 8 hours 08/30/18 0928 09/05/18 1001   08/28/18 1100  vancomycin (VANCOCIN) IVPB 750 mg/150 ml premix  Status:  Discontinued     750 mg 150 mL/hr over 60 Minutes Intravenous Every 24 hours 08/27/18 1225 08/28/18 1039   08/28/18 1045  vancomycin variable dose per unstable renal function (pharmacist dosing)  Status:  Discontinued      Does not apply See admin instructions 08/28/18 1045 08/28/18 1331   08/27/18 1800  cefTRIAXone (ROCEPHIN) 2 g in sodium chloride 0.9 % 100 mL IVPB  Status:  Discontinued     2 g 200 mL/hr over 30 Minutes Intravenous Every 24 hours 08/27/18 0759 08/30/18 0923   08/27/18 0900  vancomycin (VANCOCIN) 2,000 mg in sodium chloride 0.9 % 500 mL IVPB     2,000 mg 250 mL/hr over 120 Minutes Intravenous  Once 08/27/18 0811 08/27/18 1111   08/27/18 0515  cefTRIAXone (ROCEPHIN) 1 g in sodium chloride 0.9 % 100 mL IVPB     1 g 200 mL/hr over 30 Minutes Intravenous  Once 08/27/18 0502 08/27/18 0603   08/27/18 0500  vancomycin (VANCOCIN) IVPB 1000 mg/200 mL premix  Status:   Discontinued     1,000 mg 200 mL/hr over 60 Minutes Intravenous  Once 08/27/18 0446 08/27/18 0810       Assessment/Plan AKI on CKD-III - Cr down 1.59 HTN Obesity   Left groin / thigh abscess S/p I&D Dr. Ninfa Linden 2/20 - POD 5 - culture:ESCHERICHIA COLI - TID wet to dry dressing changes - WBC trending down 14.5, afebrile  ID -zosyn 2/19>>2/25, doxycycline 2/19>>2/21, vancomycin 2/16>>2/17, bactrim/augmentin 2/25>> FEN -reg diet VTE -SCDs, lovenox Foley -none  Plan- WBC trending on, on oral antibiotics. Continue TID dressing changes. Shower with wounds open. Continue working on oral pain regimen. Daughter hopefully to come in tomorrow to be taught how to do dressing changes.   LOS: 10 days    Wellington Hampshire , [facility] Surgery 09/06/2018, 11:23 AM Pager: [phone] Mon-Thurs 7:00 am-4:30 pm Fri 7:00 am -11:30 AM Sat-Sun 7:00 am-11:30 am

## 2018-09-06 NOTE — Care Management Note (Signed)
Case Management Note  Patient Details  Name: Caitlin Taylor MRN: [id number] Date of Birth: 10-14-1970     Expected Discharge Date:  08/29/18               Expected Discharge Plan:  [place]  In-House Referral:     Discharge planning Services  CM Consult  Post Acute Care Choice:  Home Health Choice offered to:  Patient  DME Arranged:  3-N-1 DME Agency:  [place]:  RN for wound care HH Agency:  [place]  Status of Service:  Completed, signed off  If discussed at [place] of Stay Meetings, dates discussed:    Additional CommentsLynnell Catalan, RN 09/06/2018, 12:08 PM [phone]

## 2018-09-07 LAB — BASIC METABOLIC PANEL
Anion gap: 9 (ref 5–15)
BUN: 16 mg/dL (ref 6–20)
CALCIUM: 8.5 mg/dL — AB (ref 8.9–10.3)
CO2: 26 mmol/L (ref 22–32)
Chloride: 107 mmol/L (ref 98–111)
Creatinine, Ser: 1.69 mg/dL — ABNORMAL HIGH (ref 0.44–1.00)
GFR calc Af Amer: 41 mL/min — ABNORMAL LOW (ref >60)
GFR calc non Af Amer: 36 mL/min — ABNORMAL LOW (ref >60)
Glucose, Bld: 91 mg/dL (ref 70–99)
Potassium: 4.2 mmol/L (ref 3.5–5.1)
Sodium: 142 mmol/L (ref 135–145)

## 2018-09-07 LAB — CBC
HEMATOCRIT: 36.4 % (ref 36.0–46.0)
Hemoglobin: 11.3 g/dL — ABNORMAL LOW (ref 12.0–15.0)
MCH: 30.5 pg (ref 26.0–34.0)
MCHC: 31 g/dL (ref 30.0–36.0)
MCV: 98.4 fL (ref 80.0–100.0)
Platelets: 327 10*3/uL (ref 150–400)
RBC: 3.7 MIL/uL — ABNORMAL LOW (ref 3.87–5.11)
RDW: 14.8 % (ref 11.5–15.5)
WBC: 13.9 10*3/uL — ABNORMAL HIGH (ref 4.0–10.5)
nRBC: 0 % (ref 0.0–0.2)

## 2018-09-07 MED ORDER — DEXTROSE 5 % IV SOLN
3.0000 g | INTRAVENOUS | Status: AC
  Administered 2018-09-08: 3 g via INTRAVENOUS
  Filled 2018-09-07: qty 3000
  Filled 2018-09-07: qty 3

## 2018-09-07 NOTE — Progress Notes (Signed)
[facility] Progress Note  7 Days Post-Op  Subjective: CC- Continues to feel a little better each day. Pain with dressing changes, but otherwise little to no pain. Daughter coming this afternoon to learn how to do dressing changes. Continues to have drainage from more medial wound. WBC 13.9 from 13.5, afebrile.   Objective: Vital signs in last 24 hours: Temp:  [98.1 F (36.7 C)-99.3 F (37.4 C)] 99.3 F (37.4 C) (02/27 0606) Pulse Rate:  [84-85] 84 (02/27 0606) Resp:  [18] 18 (02/27 0606) BP: (128-139)/(64-89) 134/64 (02/27 0606) SpO2:  [97 %-98 %] 97 % (02/27 0606) Last BM Date: 09/06/18  Intake/Output from previous day: 02/26 0701 - 02/27 0700 In: 360 [P.O.:360] Out: 1550 [Urine:1550] Intake/Output this shift: No intake/output data recorded.  PE: Gen: Alert, NAD, pleasant HEENT: EOM's intact, pupils equal and round Pulm: effort normal Abd: Soft, NT/ND, +BS Psych: A&Ox3  Skin: warm and dry Ext: L groin wound pink with someyellow slough,cellulitis improving, no deep tunnels/tracks. More medial wound with persistent purulent drainage from more medial aspect of wound, does not probe deep into abscess cavity. Erythematous/fluctuant area noted medial to this incision over mons pubis  Lab Results:  Recent Labs    09/06/18 0359 09/07/18 0338  WBC 13.5* 13.9*  HGB 10.0* 11.3*  HCT 32.8* 36.4  PLT 257 327   BMET Recent Labs    09/06/18 0359 09/07/18 0338  NA 139 142  K 4.3 4.2  CL 108 107  CO2 24 26  GLUCOSE 104* 91  BUN 13 16  CREATININE 1.57* 1.69*  CALCIUM 8.2* 8.5*   PT/INR No results for input(s): LABPROT, INR in the last 72 hours. CMP     Component Value Date/Time   NA 142 09/07/2018 0338   K 4.2 09/07/2018 0338   CL 107 09/07/2018 0338   CO2 26 09/07/2018 0338   GLUCOSE 91 09/07/2018 0338   BUN 16 09/07/2018 0338   CREATININE 1.69 (H) 09/07/2018 0338   CALCIUM 8.5 (L) 09/07/2018 0338   PROT 5.9 (L) 08/28/2018 0357   ALBUMIN  2.9 (L) 08/28/2018 0357   AST 20 08/28/2018 0357   ALT 38 08/28/2018 0357   ALKPHOS 81 08/28/2018 0357   BILITOT 0.9 08/28/2018 0357   GFRNONAA 36 (L) 09/07/2018 0338   GFRAA 41 (L) 09/07/2018 0338   Lipase  No results found for: LIPASE     Studies/Results: No results found.  Anti-infectives: Anti-infectives (From admission, onward)   Start     Dose/Rate Route Frequency Ordered Stop   09/05/18 1030  sulfamethoxazole-trimethoprim (BACTRIM DS,SEPTRA DS) 800-160 MG per tablet 1 tablet     1 tablet Oral Every 12 hours 09/05/18 1001     09/05/18 1030  amoxicillin-clavulanate (AUGMENTIN) 875-125 MG per tablet 1 tablet     1 tablet Oral Every 12 hours 09/05/18 1001     08/30/18 2000  doxycycline (VIBRAMYCIN) 100 mg in sodium chloride 0.9 % 250 mL IVPB  Status:  Discontinued     100 mg 125 mL/hr over 120 Minutes Intravenous Every 12 hours 08/30/18 1944 09/01/18 1600   08/30/18 1000  piperacillin-tazobactam (ZOSYN) IVPB 3.375 g  Status:  Discontinued     3.375 g 12.5 mL/hr over 240 Minutes Intravenous Every 8 hours 08/30/18 0928 09/05/18 1001   08/28/18 1100  vancomycin (VANCOCIN) IVPB 750 mg/150 ml premix  Status:  Discontinued     750 mg 150 mL/hr over 60 Minutes Intravenous Every 24 hours 08/27/18 1225 08/28/18 1039  08/28/18 1045  vancomycin variable dose per unstable renal function (pharmacist dosing)  Status:  Discontinued      Does not apply See admin instructions 08/28/18 1045 08/28/18 1331   08/27/18 1800  cefTRIAXone (ROCEPHIN) 2 g in sodium chloride 0.9 % 100 mL IVPB  Status:  Discontinued     2 g 200 mL/hr over 30 Minutes Intravenous Every 24 hours 08/27/18 0759 08/30/18 0923   08/27/18 0900  vancomycin (VANCOCIN) 2,000 mg in sodium chloride 0.9 % 500 mL IVPB     2,000 mg 250 mL/hr over 120 Minutes Intravenous  Once 08/27/18 0811 08/27/18 1111   08/27/18 0515  cefTRIAXone (ROCEPHIN) 1 g in sodium chloride 0.9 % 100 mL IVPB     1 g 200 mL/hr over 30 Minutes Intravenous   Once 08/27/18 0502 08/27/18 0603   08/27/18 0500  vancomycin (VANCOCIN) IVPB 1000 mg/200 mL premix  Status:  Discontinued     1,000 mg 200 mL/hr over 60 Minutes Intravenous  Once 08/27/18 0446 08/27/18 0810       Assessment/Plan AKI on CKD-III - Cr 1.69 HTN Obesity   Left groin / thigh abscess S/p I&D Dr. Ninfa Linden 2/20 - POD6 - culture:ESCHERICHIA COLI -TID wet to dry dressing changes  ID -zosyn 2/19>>2/25, doxycycline 2/19>>2/21, vancomycin 2/16>>2/17, bactrim/augmentin 2/25>> FEN -reg diet VTE -SCDs, lovenox Foley -none  Plan- I'm concerned about the more medial wound, it has persistent drainage and today there is an erythematous/fluctuant area. She has eaten breakfast this morning. Will review with Dr. Marcello Moores, may need OR tomorrow.   LOS: 11 days    Wellington Hampshire , [facility] Surgery 09/07/2018, 9:38 AM Pager: [phone] Mon-Thurs 7:00 am-4:30 pm Fri 7:00 am -11:30 AM Sat-Sun 7:00 am-11:30 am

## 2018-09-07 NOTE — Progress Notes (Signed)
[facility]                                                                              Patient Demographics  Caitlin Taylor, is a 48 y.o. female, DOB - [birth year], ZOX:[id number]  Admit date - 08/27/2018   Admitting Physician Debbe Odea, MD  Outpatient Primary MD for the patient is [name], MD  Outpatient specialists:   LOS - 11  days   Medical records reviewed and are as summarized below:    Chief Complaint  Patient presents with  . Abdominal Pain       Brief summary   Caitlin A Townsendis a 48 y.o.femalewith medical history significant ofDVT and PE, hypertension, chronic kidney disease, obesity presenting with left groin pain and cellulitis.  General surgery consulted and patient underwent incision and drainage x2.  Patient was placed on IV Zosyn and has been transitioned to oral Bactrim and Augmentin.  ID also consulted and following.  Assessment & Plan   Left groin abscess and cellulitis  -Status post I&D bedside of the left groin abscess on 2/17, cultures grew out E. Coli. -IV antibiotics were changed to IV Zosyn per general surgery -Underwent I&D of left thigh abscess and left groin abscess with cysts cultures sent off on 2/20 -Initial cultures from bedside I&D showed E. coli and Streptococcus G, cultures from I&D on 2/20 showed E. coli and Streptococcus group F. -ID was consulted, patient was seen by Dr. Bridget Hartshorn, IV Zosyn has been transitioned to oral Bactrim and oral Augmentin to complete course of 10 to 14 days. - d/w neurosurgery who is concerned about fluctuance and drainage from the more medial wound.  Left thigh wound is healing better.  Plan for I&D in OR tomorrow as patient ate breakfast today.    Acute kidney injury on CKD stage III -IV vancomycin has been discontinued, creatinine plateaued at 2.4 -Creatinine mildly up 1.6, possibly due to Bactrim.  For now held spironolactone, continue torsemide.  Essential  hypertension -Continue metoprolol, torsemide.  Spironolactone held  Left adrenal mass Incidentally noted on CT, will need outpatient follow-up with MRI and further work-up  History of gout Continue colchicine, allopurinol   Code Status: Full code DVT Prophylaxis:  Lovenox  Family Communication: Discussed in detail with the patient, all imaging results, lab results explained to the patient    Disposition Plan:   Time Spent in minutes 35 minutes  Procedures:   CT abdomen and pelvis 08/27/2018  Bedside I&D 08/28/2018 per general surgery--Kelly Rayburn, PA general surgery  Incision and drainage abscess left thigh and left groin per Dr. Ninfa Linden 08/31/2018   Chest x-ray 09/01/2018   Consultants:    General surgery: Dr. Ninfa Linden 08/28/2018  Infectious disease: Dr. Megan Salon 09/01/2018   Antimicrobials:   Anti-infectives (From admission, onward)   Start     Dose/Rate Route Frequency Ordered Stop   09/08/18 0600  ceFAZolin (ANCEF) 3 g in dextrose 5 % 50 mL IVPB     3 g 100 mL/hr over 30 Minutes Intravenous On call to O.R. 09/07/18 1319 09/09/18 0559   09/05/18 1030  sulfamethoxazole-trimethoprim (BACTRIM DS,SEPTRA DS) 800-160 MG per tablet  1 tablet     1 tablet Oral Every 12 hours 09/05/18 1001     09/05/18 1030  amoxicillin-clavulanate (AUGMENTIN) 875-125 MG per tablet 1 tablet     1 tablet Oral Every 12 hours 09/05/18 1001     08/30/18 2000  doxycycline (VIBRAMYCIN) 100 mg in sodium chloride 0.9 % 250 mL IVPB  Status:  Discontinued     100 mg 125 mL/hr over 120 Minutes Intravenous Every 12 hours 08/30/18 1944 09/01/18 1600   08/30/18 1000  piperacillin-tazobactam (ZOSYN) IVPB 3.375 g  Status:  Discontinued     3.375 g 12.5 mL/hr over 240 Minutes Intravenous Every 8 hours 08/30/18 0928 09/05/18 1001   08/28/18 1100  vancomycin (VANCOCIN) IVPB 750 mg/150 ml premix  Status:  Discontinued     750 mg 150 mL/hr over 60 Minutes Intravenous Every 24 hours 08/27/18 1225 08/28/18  1039   08/28/18 1045  vancomycin variable dose per unstable renal function (pharmacist dosing)  Status:  Discontinued      Does not apply See admin instructions 08/28/18 1045 08/28/18 1331   08/27/18 1800  cefTRIAXone (ROCEPHIN) 2 g in sodium chloride 0.9 % 100 mL IVPB  Status:  Discontinued     2 g 200 mL/hr over 30 Minutes Intravenous Every 24 hours 08/27/18 0759 08/30/18 0923   08/27/18 0900  vancomycin (VANCOCIN) 2,000 mg in sodium chloride 0.9 % 500 mL IVPB     2,000 mg 250 mL/hr over 120 Minutes Intravenous  Once 08/27/18 0811 08/27/18 1111   08/27/18 0515  cefTRIAXone (ROCEPHIN) 1 g in sodium chloride 0.9 % 100 mL IVPB     1 g 200 mL/hr over 30 Minutes Intravenous  Once 08/27/18 0502 08/27/18 0603   08/27/18 0500  vancomycin (VANCOCIN) IVPB 1000 mg/200 mL premix  Status:  Discontinued     1,000 mg 200 mL/hr over 60 Minutes Intravenous  Once 08/27/18 0446 08/27/18 0810         Medications  Scheduled Meds: . allopurinol  100 mg Oral Daily  . amoxicillin-clavulanate  1 tablet Oral Q12H  . colchicine  0.6 mg Oral BID  . enoxaparin (LOVENOX) injection  40 mg Subcutaneous Q24H  . Influenza vac split quadrivalent PF  0.5 mL Intramuscular Tomorrow-1000  . metoprolol tartrate  12.5 mg Oral BID  . polyethylene glycol  17 g Oral Daily  . sulfamethoxazole-trimethoprim  1 tablet Oral Q12H  . torsemide  20 mg Oral Daily   Continuous Infusions: . sodium chloride Stopped (08/31/18 0229)  . [START ON 09/08/2018]  ceFAZolin (ANCEF) IV     PRN Meds:.sodium chloride, acetaminophen **OR** acetaminophen, morphine injection, ondansetron (ZOFRAN) IV, oxyCODONE      Subjective:   Caitlin Taylor was seen and examined today.  No fevers, patient clinically feels better however medial wound still has drainage.  Left thigh wound is healing.  Patient denies dizziness, chest pain, shortness of breath, abdominal pain, N/V/D/C, new weakness, numbess, tingling.  No acute issues  overnight.  Objective:   Vitals:   09/06/18 0550 09/06/18 1431 09/06/18 2113 09/07/18 0606  BP: 124/84 139/84 128/89 134/64  Pulse: 71 84 85 84  Resp: 17 18 18 18   Temp: 98.2 F (36.8 C) 98.1 F (36.7 C) 98.6 F (37 C) 99.3 F (37.4 C)  TempSrc: Oral Oral Oral Oral  SpO2: 97% 98% 98% 97%  Weight:      Height:        Intake/Output Summary (Last 24 hours) at 09/07/2018 1333 Last data filed  at 09/07/2018 0608 Gross per 24 hour  Intake 360 ml  Output 1550 ml  Net -1190 ml     Wt Readings from Last 3 Encounters:  09/05/18 132.8 kg  06/26/18 125.2 kg  11/17/17 121.1 kg     Physical Exam  General: Alert and oriented x 3, NAD  Eyes:  HEENT:  Atraumatic, normocephalic  Cardiovascular: S1 S2 clear,  RRR. No pedal edema b/l  Respiratory: CTAB, no wheezing, rales or rhonchi  Gastrointestinal: Soft, nontender, nondistended, NBS  Ext: no pedal edema bilaterally  Neuro: no new deficits  Musculoskeletal: No cyanosis, clubbing  Skin: Left medial wound with fluctuance and drainage, left thigh wound healing.  Psych: Normal affect and demeanor, alert and oriented x3    Data Reviewed:  I have personally reviewed following labs and imaging studies  Micro Results Recent Results (from the past 240 hour(s))  Aerobic/Anaerobic Culture (surgical/deep wound)     Status: None   Collection Time: 08/31/18 10:20 AM  Result Value Ref Range Status   Specimen Description   Final    SFT TISS OTH Performed at [facility] [address] [place], [place] [address]    Special Requests   Final    NONE Performed at [facility], [place] [address], [place], [place] [address]    Gram Stain   Final    FEW WBC PRESENT, PREDOMINANTLY PMN NO ORGANISMS SEEN    Culture   Final    FEW ESCHERICHIA COLI CRITICAL RESULT CALLED TO, READ BACK BY AND VERIFIED WITH: C. HUGHEY, RN ([facility]) CONCERNING CULTURE GROWTH AT 1750 ON 09/02/18 BY C. JESSUP, MLT. RARE  STREPTOCOCCUS GROUP F RARE PREVOTELLA SPECIES BETA LACTAMASE NEGATIVE Performed at [facility], [place] [address], [place], [place] [address]    Report Status 09/05/2018 FINAL  Final   Organism ID, Bacteria ESCHERICHIA COLI  Final      Susceptibility   Escherichia coli - MIC*    AMPICILLIN >=32 RESISTANT Resistant     CEFAZOLIN 8 SENSITIVE Sensitive     CEFEPIME <=1 SENSITIVE Sensitive     CEFTAZIDIME <=1 SENSITIVE Sensitive     CEFTRIAXONE <=1 SENSITIVE Sensitive     CIPROFLOXACIN <=0.25 SENSITIVE Sensitive     GENTAMICIN <=1 SENSITIVE Sensitive     IMIPENEM <=0.25 SENSITIVE Sensitive     TRIMETH/SULFA <=20 SENSITIVE Sensitive     AMPICILLIN/SULBACTAM >=32 RESISTANT Resistant     PIP/TAZO 8 SENSITIVE Sensitive     Extended ESBL NEGATIVE Sensitive     * FEW ESCHERICHIA COLI  Culture, blood (Routine X 2) w Reflex to ID Panel     Status: None   Collection Time: 09/01/18  9:30 AM  Result Value Ref Range Status   Specimen Description   Final    BLOOD LEFT HAND Performed at [facility] [address], [place], [place] [address]    Special Requests   Final    BOTTLES DRAWN AEROBIC ONLY Blood Culture results may not be optimal due to an inadequate volume of blood received in culture bottles Performed at [facility] [address], [place], [place] [address]    Culture   Final    NO GROWTH 5 DAYS Performed at [facility], [place] [address] [place], [place] [address]    Report Status 09/06/2018 FINAL  Final  Culture, blood (Routine X 2) w Reflex to ID Panel     Status: None   Collection Time:  09/01/18  9:32 AM  Result Value Ref Range Status   Specimen Description   Final    BLOOD RIGHT HAND Performed at [facility] [address], [place], [place] [address]    Special Requests   Final    BOTTLES DRAWN AEROBIC ONLY Blood Culture adequate volume Performed at [facility] [address], [place], [place] [address]    Culture   Final    NO GROWTH 5 DAYS Performed at [facility], [place] [address], [place], [place] [address]    Report Status 09/06/2018 FINAL  Final  Culture, Urine     Status: Abnormal   Collection Time: 09/01/18  3:23 PM  Result Value Ref Range Status   Specimen Description   Final    URINE, CLEAN CATCH Performed at [facility], [place] [address], [place], [place] [address]    Special Requests   Final    NONE Performed at [facility], [place] [address] [place],  [address]    Culture 20,000 COLONIES/mL ENTEROBACTER AEROGENES (A)  Final   Report Status 09/03/2018 FINAL  Final   Organism ID, Bacteria ENTEROBACTER AEROGENES (A)  Final      Susceptibility   Enterobacter aerogenes - MIC*    CEFAZOLIN >=64 RESISTANT Resistant     CEFTRIAXONE 16 INTERMEDIATE Intermediate     CIPROFLOXACIN <=0.25 SENSITIVE Sensitive     GENTAMICIN <=1 SENSITIVE Sensitive     IMIPENEM <=0.25 SENSITIVE Sensitive     NITROFURANTOIN 128 RESISTANT Resistant     TRIMETH/SULFA <=20 SENSITIVE Sensitive     PIP/TAZO >=128 RESISTANT Resistant     * 20,000 COLONIES/mL ENTEROBACTER AEROGENES    Radiology Reports Dg Chest 2 View  Result Date: 09/01/2018 CLINICAL DATA:  Shortness of breath.  Leukocytosis. EXAM: CHEST - 2 VIEW COMPARISON:  Chest CT 09/03/2016 FINDINGS: Cardiomediastinal silhouette is normal. Mediastinal contours appear intact. There is no evidence of focal airspace consolidation, pleural effusion or pneumothorax. Osseous structures are without acute abnormality. Soft tissues are grossly normal. IMPRESSION: No active cardiopulmonary disease. Electronically Signed   By: Fidela Salisbury M.D.   On: 09/01/2018 11:12   Ct Abdomen Pelvis W Contrast  Addendum Date: 08/27/2018   ADDENDUM REPORT: 08/27/2018 04:32 ADDENDUM: Add to IMPRESSION: Thickened urinary bladder wall, a finding felt to be indicative of a degree of cystitis.  Electronically Signed   By: Lowella Grip III M.D.   On: 08/27/2018 04:32   Result Date: 08/27/2018 CLINICAL DATA:  Abdominal pain, primarily left-sided EXAM: CT ABDOMEN AND PELVIS WITH CONTRAST TECHNIQUE: Multidetector CT imaging of the abdomen and pelvis was performed using the standard protocol following bolus administration of intravenous contrast. CONTRAST:  61mL ISOVUE-300 IOPAMIDOL (ISOVUE-300) INJECTION 61% COMPARISON:  None. FINDINGS: Lower chest: There is bibasilar atelectatic change. Hepatobiliary: No focal liver lesions are evident. Gallbladder appears contracted. No biliary duct dilatation evident. Pancreas: No pancreatic mass or inflammatory focus. Spleen: No splenic lesions are evident. Adrenals/Urinary Tract: Right adrenal appears normal. There is a mass arising from the left adrenal measuring 2.3 x 2.2 cm. The attenuation of this mass does not indicate that it represents an adenoma. There is a 4 mm probable cyst in the upper pole left kidney. There is no evident hydronephrosis on either side. There is no evident renal or ureteral calculus on either side. The urinary bladder wall is thickened. Stomach/Bowel: The rectum is borderline distended with stool. There is no bowel wall or mesenteric thickening.  No diverticulitis evident. There is no bowel obstruction. There is no evident free air or portal venous air. There is lipomatous infiltration of the ileocecal valve. Vascular/Lymphatic: There is no abdominal aortic aneurysm. No vascular lesions are appreciable. There is no adenopathy in the abdomen or pelvis. Reproductive: The uterus is anteverted.  No pelvic mass is evident. Other: Appendix appears unremarkable. No abscess or ascites is evident in the peritoneum or retroperitoneum of the abdomen and pelvis. There is a small ventral hernia containing only fat. There is soft tissue stranding in the left inguinal region. There is ill-defined fluid in the subcutaneous region of the left inguinal  region this ill-defined collection measures 3.8 x 3.6 x 3.3 cm. Musculoskeletal: There is postoperative change in the lumbar spine at L4 and L5. There is degenerative change in the lower lumbar spine. There are no blastic or lytic bone lesions. No intramuscular lesions are evident. IMPRESSION: 1. There is inflammatory change with apparent developing phlegmon in the medial upper left thigh/left inguinal region. No air is seen in this area. 2. No evident diverticulitis. No bowel obstruction or bowel wall thickening. No abscess within the peritoneum or retroperitoneum of the abdomen and pelvis. Appendix appears normal. 3. There is a left adrenal mass measuring 2.3 x 2.2 cm. Etiology uncertain. Nonemergent adrenal MR to further evaluate this mass pre and post-contrast may well be advisable. 4.  No evident renal or ureteral calculus.  No hydronephrosis. 5.  Postoperative change at L4 and L5. Electronically Signed: By: Lowella Grip III M.D. On: 08/27/2018 04:25    Lab Data:  CBC: Recent Labs  Lab 09/02/18 0443 09/03/18 0515 09/04/18 0558 09/05/18 0457 09/06/18 0359 09/07/18 0338  WBC 19.8* 18.3* 19.1* 14.5* 13.5* 13.9*  NEUTROABS 15.8* 14.9* 16.0* 11.8* 11.6*  --   HGB 10.0* 10.3* 10.0* 10.5* 10.0* 11.3*  HCT 31.7* 32.4* 31.4* 32.7* 32.8* 36.4  MCV 96.9 96.1 96.3 96.7 98.5 98.4  PLT 177 187 204 229 257 431   Basic Metabolic Panel: Recent Labs  Lab 09/03/18 0515 09/04/18 0558 09/05/18 0457 09/06/18 0359 09/07/18 0338  NA 140 140 140 139 142  K 4.7 4.6 4.3 4.3 4.2  CL 110 109 108 108 107  CO2 22 23 26 24 26   GLUCOSE 96 94 104* 104* 91  BUN 16 16 15 13 16   CREATININE 1.34* 1.62* 1.59* 1.57* 1.69*  CALCIUM 8.3* 8.2* 8.4* 8.2* 8.5*   GFR: Estimated Creatinine Clearance: 55 mL/min (A) (by C-G formula based on SCr of 1.69 mg/dL (H)). Liver Function Tests: No results for input(s): AST, ALT, ALKPHOS, BILITOT, PROT, ALBUMIN in the last 168 hours. No results for input(s): LIPASE,  AMYLASE in the last 168 hours. No results for input(s): AMMONIA in the last 168 hours. Coagulation Profile: No results for input(s): INR, PROTIME in the last 168 hours. Cardiac Enzymes: No results for input(s): CKTOTAL, CKMB, CKMBINDEX, TROPONINI in the last 168 hours. BNP (last 3 results) No results for input(s): PROBNP in the last 8760 hours. HbA1C: No results for input(s): HGBA1C in the last 72 hours. CBG: No results for input(s): GLUCAP in the last 168 hours. Lipid Profile: No results for input(s): CHOL, HDL, LDLCALC, TRIG, CHOLHDL, LDLDIRECT in the last 72 hours. Thyroid Function Tests: No results for input(s): TSH, T4TOTAL, FREET4, T3FREE, THYROIDAB in the last 72 hours. Anemia Panel: No results for input(s): VITAMINB12, FOLATE, FERRITIN, TIBC, IRON, RETICCTPCT in the last 72 hours. Urine analysis:    Component Value Date/Time   COLORURINE YELLOW  09/01/2018 1523   APPEARANCEUR HAZY (A) 09/01/2018 1523   LABSPEC 1.016 09/01/2018 1523   PHURINE 5.0 09/01/2018 1523   GLUCOSEU NEGATIVE 09/01/2018 1523   HGBUR SMALL (A) 09/01/2018 1523   BILIRUBINUR NEGATIVE 09/01/2018 1523   KETONESUR NEGATIVE 09/01/2018 1523   PROTEINUR 30 (A) 09/01/2018 1523   NITRITE NEGATIVE 09/01/2018 1523   LEUKOCYTESUR TRACE (A) 09/01/2018 1523     Ripudeep Rai M.D. [facility] 09/07/2018, 1:33 PM  Pager: [phone] Between 7am to 7pm - call Pager - [phone]  After 7pm go to [email or web address] - password [email or web address]  Call night coverage person covering after 7pm

## 2018-09-07 NOTE — Anesthesia Preprocedure Evaluation (Addendum)
Anesthesia Evaluation  Patient identified by MRN, date of birth, ID band Patient awake    Reviewed: Allergy & Precautions, NPO status , Patient's Chart, lab work & pertinent test results  History of Anesthesia Complications Negative for: history of anesthetic complications  Airway Mallampati: III  TM Distance: >3 FB Neck ROM: Full    Dental  (+) Teeth Intact, Dental Advisory Given   Pulmonary neg pulmonary ROS,    Pulmonary exam normal breath sounds clear to auscultation       Cardiovascular hypertension, +CHF  Normal cardiovascular exam Rhythm:Regular Rate:Normal  TTE 08/2016: Ef 65-03%, grade 1 diastolic dysfunction    Neuro/Psych negative neurological ROS     GI/Hepatic negative GI ROS, Neg liver ROS,   Endo/Other  Morbid obesity  Renal/GU Renal Insufficiency and ARFRenal diseaseCr 1.7     Musculoskeletal negative musculoskeletal ROS (+)   Abdominal   Peds  Hematology  (+) anemia , Hgb 11.3   Anesthesia Other Findings Day of surgery medications reviewed with the patient.  Groin abscess  Reproductive/Obstetrics                            Anesthesia Physical Anesthesia Plan  ASA: III  Anesthesia Plan: General   Post-op Pain Management:    Induction: Intravenous  PONV Risk Score and Plan: 3 and Treatment may vary due to age or medical condition, Ondansetron, Dexamethasone and Midazolam  Airway Management Planned: Oral ETT  Additional Equipment:   Intra-op Plan:   Post-operative Plan: Extubation in OR  Informed Consent: I have reviewed the patients History and Physical, chart, labs and discussed the procedure including the risks, benefits and alternatives for the proposed anesthesia with the patient or authorized representative who has indicated his/her understanding and acceptance.     Dental advisory given  Plan Discussed with: CRNA  Anesthesia Plan Comments:         Anesthesia Quick Evaluation

## 2018-09-08 ENCOUNTER — Encounter (HOSPITAL_COMMUNITY)
Admission: EM | Disposition: A | Discharge status: Home Health | Payer: Self-pay | Source: Home / Self Care | Attending: Internal Medicine

## 2018-09-08 ENCOUNTER — Inpatient Hospital Stay (HOSPITAL_COMMUNITY): Payer: 59 | Admitting: Anesthesiology

## 2018-09-08 ENCOUNTER — Encounter (HOSPITAL_COMMUNITY): Payer: Self-pay | Admitting: Anesthesiology

## 2018-09-08 HISTORY — PX: INCISION AND DRAINAGE ABSCESS: SHX5864

## 2018-09-08 SURGERY — INCISION AND DRAINAGE, ABSCESS
Anesthesia: General | Site: Leg Upper | Laterality: Left

## 2018-09-08 MED ORDER — PROMETHAZINE HCL 25 MG/ML IJ SOLN: 6.2500 mg | INTRAMUSCULAR | Status: DC | PRN

## 2018-09-08 MED ORDER — SUCCINYLCHOLINE CHLORIDE 20 MG/ML IJ SOLN
INTRAMUSCULAR | Status: DC | PRN
  Administered 2018-09-08: 160 mg via INTRAVENOUS

## 2018-09-08 MED ORDER — MIDAZOLAM HCL 5 MG/5ML IJ SOLN
INTRAMUSCULAR | Status: DC | PRN
  Administered 2018-09-08 (×2): 1 mg via INTRAVENOUS

## 2018-09-08 MED ORDER — PROPOFOL 10 MG/ML IV BOLUS
INTRAVENOUS | Status: AC
  Filled 2018-09-08: qty 20

## 2018-09-08 MED ORDER — COLCHICINE 0.6 MG PO TABS
0.6000 mg | ORAL_TABLET | Freq: Every day | ORAL | Status: DC
  Administered 2018-09-08 – 2018-09-11 (×4): 0.6 mg via ORAL
  Filled 2018-09-08 (×3): qty 1

## 2018-09-08 MED ORDER — LIDOCAINE 2% (20 MG/ML) 5 ML SYRINGE
INTRAMUSCULAR | Status: AC
  Filled 2018-09-08: qty 5

## 2018-09-08 MED ORDER — SODIUM CHLORIDE 0.9 % IR SOLN
Status: DC | PRN
  Administered 2018-09-08: 1000 mL

## 2018-09-08 MED ORDER — ROCURONIUM BROMIDE 100 MG/10ML IV SOLN
INTRAVENOUS | Status: AC
  Filled 2018-09-08: qty 1

## 2018-09-08 MED ORDER — OXYCODONE HCL 5 MG PO TABS: 5.0000 mg | ORAL_TABLET | Freq: Once | ORAL | Status: DC | PRN

## 2018-09-08 MED ORDER — ONDANSETRON HCL 4 MG/2ML IJ SOLN
INTRAMUSCULAR | Status: DC | PRN
  Administered 2018-09-08: 4 mg via INTRAVENOUS

## 2018-09-08 MED ORDER — FENTANYL CITRATE (PF) 100 MCG/2ML IJ SOLN
INTRAMUSCULAR | Status: DC | PRN
  Administered 2018-09-08 (×5): 50 ug via INTRAVENOUS

## 2018-09-08 MED ORDER — ACETAMINOPHEN 10 MG/ML IV SOLN: 1000.0000 mg | Freq: Once | INTRAVENOUS | Status: DC | PRN

## 2018-09-08 MED ORDER — FENTANYL CITRATE (PF) 250 MCG/5ML IJ SOLN
INTRAMUSCULAR | Status: AC
  Filled 2018-09-08: qty 5

## 2018-09-08 MED ORDER — LACTATED RINGERS IV SOLN
INTRAVENOUS | Status: DC
  Administered 2018-09-08 – 2018-09-11 (×4): via INTRAVENOUS

## 2018-09-08 MED ORDER — FENTANYL CITRATE (PF) 100 MCG/2ML IJ SOLN
25.0000 ug | INTRAMUSCULAR | Status: DC | PRN
  Administered 2018-09-08: 50 ug via INTRAVENOUS

## 2018-09-08 MED ORDER — DEXAMETHASONE SODIUM PHOSPHATE 10 MG/ML IJ SOLN
INTRAMUSCULAR | Status: AC
  Filled 2018-09-08: qty 1

## 2018-09-08 MED ORDER — LIDOCAINE HCL (CARDIAC) PF 100 MG/5ML IV SOSY
PREFILLED_SYRINGE | INTRAVENOUS | Status: DC | PRN
  Administered 2018-09-08: 100 mg via INTRAVENOUS

## 2018-09-08 MED ORDER — OXYCODONE HCL 5 MG/5ML PO SOLN: 5.0000 mg | Freq: Once | ORAL | Status: DC | PRN

## 2018-09-08 MED ORDER — PROPOFOL 10 MG/ML IV BOLUS
INTRAVENOUS | Status: DC | PRN
  Administered 2018-09-08: 50 mg via INTRAVENOUS
  Administered 2018-09-08: 200 mg via INTRAVENOUS

## 2018-09-08 MED ORDER — MIDAZOLAM HCL 2 MG/2ML IJ SOLN
INTRAMUSCULAR | Status: AC
  Filled 2018-09-08: qty 2

## 2018-09-08 MED ORDER — DEXAMETHASONE SODIUM PHOSPHATE 10 MG/ML IJ SOLN
INTRAMUSCULAR | Status: DC | PRN
  Administered 2018-09-08: 8 mg via INTRAVENOUS

## 2018-09-08 MED ORDER — FENTANYL CITRATE (PF) 100 MCG/2ML IJ SOLN
INTRAMUSCULAR | Status: AC
  Filled 2018-09-08: qty 2

## 2018-09-08 MED ORDER — ONDANSETRON HCL 4 MG/2ML IJ SOLN
INTRAMUSCULAR | Status: AC
  Filled 2018-09-08: qty 2

## 2018-09-08 SURGICAL SUPPLY — 27 items
BLADE SURG SZ10 CARB STEEL (BLADE) ×3 IMPLANT
BNDG GAUZE ELAST 4 BULKY (GAUZE/BANDAGES/DRESSINGS) ×3 IMPLANT
COVER WAND RF STERILE (DRAPES) IMPLANT
DECANTER SPIKE VIAL GLASS SM (MISCELLANEOUS) IMPLANT
DERMABOND ADVANCED (GAUZE/BANDAGES/DRESSINGS)
DERMABOND ADVANCED .7 DNX12 (GAUZE/BANDAGES/DRESSINGS) IMPLANT
DRAPE LAPAROTOMY TRNSV 102X78 (DRAPE) IMPLANT
DRSG PAD ABDOMINAL 8X10 ST (GAUZE/BANDAGES/DRESSINGS) ×3 IMPLANT
ELECT PENCIL ROCKER SW 15FT (MISCELLANEOUS) ×3 IMPLANT
ELECT REM PT RETURN 15FT ADLT (MISCELLANEOUS) ×3 IMPLANT
GAUZE SPONGE 4X4 12PLY STRL (GAUZE/BANDAGES/DRESSINGS) ×3 IMPLANT
GLOVE BIOGEL PI IND STRL 7.0 (GLOVE) ×1 IMPLANT
GLOVE BIOGEL PI INDICATOR 7.0 (GLOVE) ×2
GOWN STRL REUS W/TWL 2XL LVL3 (GOWN DISPOSABLE) ×3 IMPLANT
GOWN STRL REUS W/TWL XL LVL3 (GOWN DISPOSABLE) ×3 IMPLANT
KIT BASIN OR (CUSTOM PROCEDURE TRAY) ×3 IMPLANT
NEEDLE HYPO 22GX1.5 SAFETY (NEEDLE) ×3 IMPLANT
PACK BASIC VI WITH GOWN DISP (CUSTOM PROCEDURE TRAY) ×3 IMPLANT
SPONGE LAP 18X18 RF (DISPOSABLE) ×6 IMPLANT
STAPLER VISISTAT 35W (STAPLE) ×3 IMPLANT
SUT VIC AB 3-0 SH 18 (SUTURE) IMPLANT
SUT VIC AB 4-0 PS2 18 (SUTURE) IMPLANT
SYR 20CC LL (SYRINGE) ×3 IMPLANT
TAPE CLOTH SURG 4X10 WHT LF (GAUZE/BANDAGES/DRESSINGS) ×3 IMPLANT
TOWEL OR 17X26 10 PK STRL BLUE (TOWEL DISPOSABLE) ×3 IMPLANT
TOWEL OR NON WOVEN STRL DISP B (DISPOSABLE) ×3 IMPLANT
YANKAUER SUCT BULB TIP 10FT TU (MISCELLANEOUS) ×3 IMPLANT

## 2018-09-08 NOTE — Progress Notes (Signed)
[facility]                                                                              Patient Demographics  Caitlin Taylor, is a 48 y.o. female, DOB - 1970-09-13, VOZ:[id number]  Admit date - 08/27/2018   Admitting Physician Debbe Odea, MD  Outpatient Primary MD for the patient is [name], MD  Outpatient specialists:   LOS - 12  days   Medical records reviewed and are as summarized below:    Chief Complaint  Patient presents with  . Abdominal Pain       Brief summary   Caitlin A Townsendis a 48 y.o.femalewith medical history significant ofDVT and PE, hypertension, chronic kidney disease, obesity presenting with left groin pain and cellulitis.  General surgery consulted and patient underwent incision and drainage x2.  Patient was placed on IV Zosyn and has been transitioned to oral Bactrim and Augmentin.  ID also consulted and following.  Assessment & Plan   Left groin abscess and cellulitis  -Status post I&D bedside of the left groin abscess on 2/17, cultures grew out E. Coli. -IV antibiotics were changed to IV Zosyn per general surgery -Underwent I&D of left thigh abscess and left groin abscess with cysts cultures sent off on 2/20 -Initial cultures from bedside I&D showed E. coli and Streptococcus G, cultures from I&D on 2/20 showed E. coli and Streptococcus group F. -ID was consulted, patient was seen by Dr. Bridget Hartshorn, IV Zosyn has been transitioned to oral Bactrim and oral Augmentin to complete course of 10 to 14 days. -Status post I&D again today, appreciate surgery  Acute kidney injury on CKD stage III -IV vancomycin has been discontinued, creatinine plateaued at 2.4 -Creatinine trending up to 1.6 today, possibly due to Bactrim. - Continue to hold spironolactone, hold torsemide today.    Essential hypertension -Continue metoprolol  Left adrenal mass Incidentally noted on CT, will need outpatient follow-up with MRI and further  work-up  History of gout Continue colchicine, allopurinol Change colchicine to 0.6 mg daily  Code Status: Full code DVT Prophylaxis:  Lovenox  Family Communication: Discussed in detail with the patient, all imaging results, lab results explained to the patient    Disposition Plan: Plan for surgery today.  Time Spent in minutes 35 minutes  Procedures:   CT abdomen and pelvis 08/27/2018  Bedside I&D 08/28/2018 per general surgery--Kelly Rayburn, PA general surgery  Incision and drainage abscess left thigh and left groin per Dr. Ninfa Linden 08/31/2018   Chest x-ray 09/01/2018      INCISION AND DRAINAGE ABSCESS, mons pubis, 09/08/2018 by Dr. Marcello Moores   Consultants:    General surgery: Dr. Ninfa Linden 08/28/2018  Infectious disease: Dr. Megan Salon 09/01/2018   Antimicrobials:   Anti-infectives (From admission, onward)   Start     Dose/Rate Route Frequency Ordered Stop   09/08/18 0600  ceFAZolin (ANCEF) 3 g in dextrose 5 % 50 mL IVPB     3 g 100 mL/hr over 30 Minutes Intravenous On call to O.R. 09/07/18 1319 09/08/18 1014   09/05/18 1030  sulfamethoxazole-trimethoprim (BACTRIM DS,SEPTRA DS) 800-160 MG per tablet 1 tablet  1 tablet Oral Every 12 hours 09/05/18 1001     09/05/18 1030  amoxicillin-clavulanate (AUGMENTIN) 875-125 MG per tablet 1 tablet     1 tablet Oral Every 12 hours 09/05/18 1001     08/30/18 2000  doxycycline (VIBRAMYCIN) 100 mg in sodium chloride 0.9 % 250 mL IVPB  Status:  Discontinued     100 mg 125 mL/hr over 120 Minutes Intravenous Every 12 hours 08/30/18 1944 09/01/18 1600   08/30/18 1000  piperacillin-tazobactam (ZOSYN) IVPB 3.375 g  Status:  Discontinued     3.375 g 12.5 mL/hr over 240 Minutes Intravenous Every 8 hours 08/30/18 0928 09/05/18 1001   08/28/18 1100  vancomycin (VANCOCIN) IVPB 750 mg/150 ml premix  Status:  Discontinued     750 mg 150 mL/hr over 60 Minutes Intravenous Every 24 hours 08/27/18 1225 08/28/18 1039   08/28/18 1045  vancomycin  variable dose per unstable renal function (pharmacist dosing)  Status:  Discontinued      Does not apply See admin instructions 08/28/18 1045 08/28/18 1331   08/27/18 1800  cefTRIAXone (ROCEPHIN) 2 g in sodium chloride 0.9 % 100 mL IVPB  Status:  Discontinued     2 g 200 mL/hr over 30 Minutes Intravenous Every 24 hours 08/27/18 0759 08/30/18 0923   08/27/18 0900  vancomycin (VANCOCIN) 2,000 mg in sodium chloride 0.9 % 500 mL IVPB     2,000 mg 250 mL/hr over 120 Minutes Intravenous  Once 08/27/18 0811 08/27/18 1111   08/27/18 0515  cefTRIAXone (ROCEPHIN) 1 g in sodium chloride 0.9 % 100 mL IVPB     1 g 200 mL/hr over 30 Minutes Intravenous  Once 08/27/18 0502 08/27/18 0603   08/27/18 0500  vancomycin (VANCOCIN) IVPB 1000 mg/200 mL premix  Status:  Discontinued     1,000 mg 200 mL/hr over 60 Minutes Intravenous  Once 08/27/18 0446 08/27/18 0810         Medications  Scheduled Meds: . allopurinol  100 mg Oral Daily  . amoxicillin-clavulanate  1 tablet Oral Q12H  . colchicine  0.6 mg Oral BID  . enoxaparin (LOVENOX) injection  40 mg Subcutaneous Q24H  . fentaNYL      . Influenza vac split quadrivalent PF  0.5 mL Intramuscular Tomorrow-1000  . metoprolol tartrate  12.5 mg Oral BID  . polyethylene glycol  17 g Oral Daily  . sulfamethoxazole-trimethoprim  1 tablet Oral Q12H  . torsemide  20 mg Oral Daily   Continuous Infusions: . sodium chloride Stopped (08/31/18 0229)  . lactated ringers 50 mL/hr at 09/08/18 0909   PRN Meds:.sodium chloride, acetaminophen **OR** acetaminophen, morphine injection, ondansetron (ZOFRAN) IV, oxyCODONE      Subjective:   Caitlin Taylor was seen and examined today.  No fevers.  Awaiting surgery for the more medial wound at the time of my examination.  Left thigh wound is healing.   Patient denies dizziness, chest pain, shortness of breath, abdominal pain, N/V/D/C, new weakness, numbess, tingling.  No acute issues overnight.  Objective:   Vitals:    09/08/18 1030 09/08/18 1045 09/08/18 1100 09/08/18 1119  BP: 129/86 (!) 146/96 (!) 145/93 (!) 140/94  Pulse: 78 73 72 73  Resp: 15 14 19 20   Temp:   98.4 F (36.9 C) 98.2 F (36.8 C)  TempSrc:    Oral  SpO2: 98% 95% 95% 96%  Weight:      Height:        Intake/Output Summary (Last 24 hours) at 09/08/2018 1149 Last data  filed at 09/08/2018 1100 Gross per 24 hour  Intake 1695 ml  Output 760 ml  Net 935 ml     Wt Readings from Last 3 Encounters:  09/08/18 128.7 kg  06/26/18 125.2 kg  11/17/17 121.1 kg    Physical Exam  General: Alert and oriented x 3, NAD  Eyes:   HEENT:  Atraumatic, normocephalic  Cardiovascular: S1 S2 clear, no murmurs, RRR. No pedal edema b/l  Respiratory: CTAB, no wheezing, rales or rhonchi  Gastrointestinal: Soft, nontender, nondistended, NBS  Ext: no pedal edema bilaterally  Neuro: no new deficits  Musculoskeletal: No cyanosis, clubbing  Skin: Dressing intact on the left thigh wound  Psych: Normal affect and demeanor, alert and oriented x3      Data Reviewed:  I have personally reviewed following labs and imaging studies  Micro Results Recent Results (from the past 240 hour(s))  Aerobic/Anaerobic Culture (surgical/deep wound)     Status: None   Collection Time: 08/31/18 10:20 AM  Result Value Ref Range Status   Specimen Description   Final    SFT TISS OTH Performed at [facility] [address], [place], [place] [address]    Special Requests   Final    NONE Performed at [facility], [place] [address], [place], [place] [address]    Gram Stain   Final    FEW WBC PRESENT, PREDOMINANTLY PMN NO ORGANISMS SEEN    Culture   Final    FEW ESCHERICHIA COLI CRITICAL RESULT CALLED TO, READ BACK BY AND VERIFIED WITH: C. HUGHEY, RN ([facility]) CONCERNING CULTURE GROWTH AT 1750 ON 09/02/18 BY C. JESSUP, MLT. RARE STREPTOCOCCUS GROUP F RARE PREVOTELLA SPECIES BETA LACTAMASE NEGATIVE Performed at [facility], [place] [address] [place], [place] [address]    Report Status 09/05/2018 FINAL  Final   Organism ID, Bacteria ESCHERICHIA COLI  Final      Susceptibility   Escherichia coli - MIC*    AMPICILLIN >=32 RESISTANT Resistant     CEFAZOLIN 8 SENSITIVE Sensitive     CEFEPIME <=1 SENSITIVE Sensitive     CEFTAZIDIME <=1 SENSITIVE Sensitive     CEFTRIAXONE <=1 SENSITIVE Sensitive     CIPROFLOXACIN <=0.25 SENSITIVE Sensitive     GENTAMICIN <=1 SENSITIVE Sensitive     IMIPENEM <=0.25 SENSITIVE Sensitive     TRIMETH/SULFA <=20 SENSITIVE Sensitive     AMPICILLIN/SULBACTAM >=32 RESISTANT Resistant     PIP/TAZO 8 SENSITIVE Sensitive     Extended ESBL NEGATIVE Sensitive     * FEW ESCHERICHIA COLI  Culture, blood (Routine X 2) w Reflex to ID Panel     Status: None   Collection Time: 09/01/18  9:30 AM  Result Value Ref Range Status   Specimen Description   Final    BLOOD LEFT HAND Performed at [facility] [address] [place], [place] [address]    Special Requests   Final    BOTTLES DRAWN AEROBIC ONLY Blood Culture results may not be optimal due to an inadequate volume of blood received in culture bottles Performed at [facility] [address] [place], [place] [address]    Culture   Final    NO GROWTH 5 DAYS Performed at [facility], [place] [address], [place], [place] [address]    Report Status 09/06/2018 FINAL  Final  Culture, blood (Routine X 2) w Reflex to ID Panel     Status: None   Collection Time:  09/01/18  9:32 AM  Result Value Ref Range Status   Specimen Description   Final    BLOOD RIGHT HAND Performed at [facility] [address] [place], [place] [address]    Special Requests   Final    BOTTLES DRAWN AEROBIC ONLY Blood Culture adequate volume Performed at [facility] [address] [place], [place] [address]    Culture   Final    NO GROWTH 5 DAYS Performed at [facility], [place] [address], [place], [place] [address]    Report Status 09/06/2018 FINAL  Final  Culture, Urine     Status: Abnormal   Collection Time: 09/01/18  3:23 PM  Result Value Ref Range Status   Specimen Description   Final    URINE, CLEAN CATCH Performed at [facility], [place] [address] [place], [place] [address]    Special Requests   Final    NONE Performed at [facility], [place] [address] [place], [place] [address]    Culture 20,000 COLONIES/mL ENTEROBACTER AEROGENES (A)  Final   Report Status 09/03/2018 FINAL  Final   Organism ID, Bacteria ENTEROBACTER AEROGENES (A)  Final      Susceptibility   Enterobacter aerogenes - MIC*    CEFAZOLIN >=64 RESISTANT Resistant     CEFTRIAXONE 16 INTERMEDIATE Intermediate     CIPROFLOXACIN <=0.25 SENSITIVE Sensitive     GENTAMICIN <=1 SENSITIVE Sensitive     IMIPENEM <=0.25 SENSITIVE Sensitive     NITROFURANTOIN 128 RESISTANT Resistant     TRIMETH/SULFA <=20 SENSITIVE Sensitive     PIP/TAZO >=128 RESISTANT Resistant     * 20,000 COLONIES/mL ENTEROBACTER AEROGENES    Radiology Reports Dg Chest 2 View  Result Date: 09/01/2018 CLINICAL DATA:  Shortness of breath.  Leukocytosis. EXAM: CHEST - 2 VIEW COMPARISON:  Chest CT 09/03/2016 FINDINGS: Cardiomediastinal silhouette is normal. Mediastinal contours appear intact. There is no evidence of focal airspace consolidation, pleural effusion or pneumothorax. Osseous structures are without acute abnormality. Soft tissues are grossly normal. IMPRESSION: No active cardiopulmonary disease. Electronically Signed   By: Fidela Salisbury M.D.   On: 09/01/2018 11:12   Ct Abdomen Pelvis W Contrast  Addendum Date: 08/27/2018   ADDENDUM REPORT: 08/27/2018 04:32 ADDENDUM: Add to IMPRESSION: Thickened urinary bladder wall, a finding felt to be indicative of a degree of cystitis. Electronically Signed   By: Lowella Grip III M.D.   On: 08/27/2018 04:32   Result  Date: 08/27/2018 CLINICAL DATA:  Abdominal pain, primarily left-sided EXAM: CT ABDOMEN AND PELVIS WITH CONTRAST TECHNIQUE: Multidetector CT imaging of the abdomen and pelvis was performed using the standard protocol following bolus administration of intravenous contrast. CONTRAST:  4mL ISOVUE-300 IOPAMIDOL (ISOVUE-300) INJECTION 61% COMPARISON:  None. FINDINGS: Lower chest: There is bibasilar atelectatic change. Hepatobiliary: No focal liver lesions are evident. Gallbladder appears contracted. No biliary duct dilatation evident. Pancreas: No pancreatic mass or inflammatory focus. Spleen: No splenic lesions are evident. Adrenals/Urinary Tract: Right adrenal appears normal. There is a mass arising from the left adrenal measuring 2.3 x 2.2 cm. The attenuation of this mass does not indicate that it represents an adenoma. There is a 4 mm probable cyst in the upper pole left kidney. There is no evident hydronephrosis on either side. There is no evident renal or ureteral calculus on either side. The urinary bladder wall is thickened. Stomach/Bowel: The rectum is borderline distended with stool. There is no bowel wall or mesenteric thickening.  No diverticulitis evident. There is no bowel obstruction. There is no evident free air or portal venous air. There is lipomatous infiltration of the ileocecal valve. Vascular/Lymphatic: There is no abdominal aortic aneurysm. No vascular lesions are appreciable. There is no adenopathy in the abdomen or pelvis. Reproductive: The uterus is anteverted.  No pelvic mass is evident. Other: Appendix appears unremarkable. No abscess or ascites is evident in the peritoneum or retroperitoneum of the abdomen and pelvis. There is a small ventral hernia containing only fat. There is soft tissue stranding in the left inguinal region. There is ill-defined fluid in the subcutaneous region of the left inguinal region this ill-defined collection measures 3.8 x 3.6 x 3.3 cm. Musculoskeletal: There is  postoperative change in the lumbar spine at L4 and L5. There is degenerative change in the lower lumbar spine. There are no blastic or lytic bone lesions. No intramuscular lesions are evident. IMPRESSION: 1. There is inflammatory change with apparent developing phlegmon in the medial upper left thigh/left inguinal region. No air is seen in this area. 2. No evident diverticulitis. No bowel obstruction or bowel wall thickening. No abscess within the peritoneum or retroperitoneum of the abdomen and pelvis. Appendix appears normal. 3. There is a left adrenal mass measuring 2.3 x 2.2 cm. Etiology uncertain. Nonemergent adrenal MR to further evaluate this mass pre and post-contrast may well be advisable. 4.  No evident renal or ureteral calculus.  No hydronephrosis. 5.  Postoperative change at L4 and L5. Electronically Signed: By: Lowella Grip III M.D. On: 08/27/2018 04:25    Lab Data:  CBC: Recent Labs  Lab 09/02/18 0443 09/03/18 0515 09/04/18 0558 09/05/18 0457 09/06/18 0359 09/07/18 0338  WBC 19.8* 18.3* 19.1* 14.5* 13.5* 13.9*  NEUTROABS 15.8* 14.9* 16.0* 11.8* 11.6*  --   HGB 10.0* 10.3* 10.0* 10.5* 10.0* 11.3*  HCT 31.7* 32.4* 31.4* 32.7* 32.8* 36.4  MCV 96.9 96.1 96.3 96.7 98.5 98.4  PLT 177 187 204 229 257 270   Basic Metabolic Panel: Recent Labs  Lab 09/03/18 0515 09/04/18 0558 09/05/18 0457 09/06/18 0359 09/07/18 0338  NA 140 140 140 139 142  K 4.7 4.6 4.3 4.3 4.2  CL 110 109 108 108 107  CO2 22 23 26 24 26   GLUCOSE 96 94 104* 104* 91  BUN 16 16 15 13 16   CREATININE 1.34* 1.62* 1.59* 1.57* 1.69*  CALCIUM 8.3* 8.2* 8.4* 8.2* 8.5*   GFR: Estimated Creatinine Clearance: 53.9 mL/min (A) (by C-G formula based on SCr of 1.69 mg/dL (H)). Liver Function Tests: No results for input(s): AST, ALT, ALKPHOS, BILITOT, PROT, ALBUMIN in the last 168 hours. No results for input(s): LIPASE, AMYLASE in the last 168 hours. No results for input(s): AMMONIA in the last 168  hours. Coagulation Profile: No results for input(s): INR, PROTIME in the last 168 hours. Cardiac Enzymes: No results for input(s): CKTOTAL, CKMB, CKMBINDEX, TROPONINI in the last 168 hours. BNP (last 3 results) No results for input(s): PROBNP in the last 8760 hours. HbA1C: No results for input(s): HGBA1C in the last 72 hours. CBG: No results for input(s): GLUCAP in the last 168 hours. Lipid Profile: No results for input(s): CHOL, HDL, LDLCALC, TRIG, CHOLHDL, LDLDIRECT in the last 72 hours. Thyroid Function Tests: No results for input(s): TSH, T4TOTAL, FREET4, T3FREE, THYROIDAB in the last 72 hours. Anemia Panel: No results for input(s): VITAMINB12, FOLATE, FERRITIN, TIBC, IRON, RETICCTPCT in the last 72 hours. Urine analysis:    Component Value Date/Time   COLORURINE YELLOW  09/01/2018 1523   APPEARANCEUR HAZY (A) 09/01/2018 1523   LABSPEC 1.016 09/01/2018 1523   PHURINE 5.0 09/01/2018 1523   GLUCOSEU NEGATIVE 09/01/2018 1523   HGBUR SMALL (A) 09/01/2018 1523   BILIRUBINUR NEGATIVE 09/01/2018 1523   KETONESUR NEGATIVE 09/01/2018 1523   PROTEINUR 30 (A) 09/01/2018 1523   NITRITE NEGATIVE 09/01/2018 1523   LEUKOCYTESUR TRACE (A) 09/01/2018 1523     Audrey Eller M.D. [facility] 09/08/2018, 11:49 AM  Pager: [phone] Between 7am to 7pm - call Pager - [phone]  After 7pm go to [email or web address] - password [email or web address]  Call night coverage person covering after 7pm

## 2018-09-08 NOTE — Progress Notes (Signed)
[facility] Progress Note  Day of Surgery  Subjective: CC- Continues to feel a little better each day.  Objective: Vital signs in last 24 hours: Temp:  [98.2 F (36.8 C)-98.8 F (37.1 C)] 98.2 F (36.8 C) (02/28 0801) Pulse Rate:  [88-90] 88 (02/28 0801) Resp:  [16-18] 17 (02/28 0801) BP: (115-151)/(63-94) 137/94 (02/28 0801) SpO2:  [91 %-99 %] 97 % (02/28 0801) Weight:  [128.7 kg] 128.7 kg (02/28 0626) Last BM Date: 09/06/18  Intake/Output from previous day: 02/27 0701 - 02/28 0700 In: 720 [P.O.:720] Out: 750 [Urine:750] Intake/Output this shift: No intake/output data recorded.  PE: Gen: Alert, NAD, pleasant HEENT: EOM's intact, pupils equal and round Pulm: effort normal Abd: Soft, NT/ND Psych: A&Ox3  Skin: warm and dry Ext: L groin wound pink with someyellow slough,cellulitis improving, no deep tunnels/tracks. More medial wound with persistent purulent drainage from more medial aspect of wound, does not probe deep into abscess cavity. Erythematous/fluctuant area noted medial to this incision over mons pubis  Lab Results:  Recent Labs    09/06/18 0359 09/07/18 0338  WBC 13.5* 13.9*  HGB 10.0* 11.3*  HCT 32.8* 36.4  PLT 257 327   BMET Recent Labs    09/06/18 0359 09/07/18 0338  NA 139 142  K 4.3 4.2  CL 108 107  CO2 24 26  GLUCOSE 104* 91  BUN 13 16  CREATININE 1.57* 1.69*  CALCIUM 8.2* 8.5*   PT/INR No results for input(s): LABPROT, INR in the last 72 hours. CMP     Component Value Date/Time   NA 142 09/07/2018 0338   K 4.2 09/07/2018 0338   CL 107 09/07/2018 0338   CO2 26 09/07/2018 0338   GLUCOSE 91 09/07/2018 0338   BUN 16 09/07/2018 0338   CREATININE 1.69 (H) 09/07/2018 0338   CALCIUM 8.5 (L) 09/07/2018 0338   PROT 5.9 (L) 08/28/2018 0357   ALBUMIN 2.9 (L) 08/28/2018 0357   AST 20 08/28/2018 0357   ALT 38 08/28/2018 0357   ALKPHOS 81 08/28/2018 0357   BILITOT 0.9 08/28/2018 0357   GFRNONAA 36 (L) 09/07/2018 0338   GFRAA 41 (L) 09/07/2018 0338   Lipase  No results found for: LIPASE     Studies/Results: No results found.  Anti-infectives: Anti-infectives (From admission, onward)   Start     Dose/Rate Route Frequency Ordered Stop   09/08/18 0600  [MAR Hold]  ceFAZolin (ANCEF) 3 g in dextrose 5 % 50 mL IVPB     (MAR Hold since Fri 09/08/2018 at 0800. Reason: Transfer to a Procedural area.)   3 g 100 mL/hr over 30 Minutes Intravenous On call to O.R. 09/07/18 1319 09/09/18 0559   09/05/18 1030  [MAR Hold]  sulfamethoxazole-trimethoprim (BACTRIM DS,SEPTRA DS) 800-160 MG per tablet 1 tablet     (MAR Hold since Fri 09/08/2018 at 0800. Reason: Transfer to a Procedural area.)   1 tablet Oral Every 12 hours 09/05/18 1001     09/05/18 1030  [MAR Hold]  amoxicillin-clavulanate (AUGMENTIN) 875-125 MG per tablet 1 tablet     (MAR Hold since Fri 09/08/2018 at 0800. Reason: Transfer to a Procedural area.)   1 tablet Oral Every 12 hours 09/05/18 1001     08/30/18 2000  doxycycline (VIBRAMYCIN) 100 mg in sodium chloride 0.9 % 250 mL IVPB  Status:  Discontinued     100 mg 125 mL/hr over 120 Minutes Intravenous Every 12 hours 08/30/18 1944 09/01/18 1600   08/30/18 1000  piperacillin-tazobactam (ZOSYN) IVPB 3.375  g  Status:  Discontinued     3.375 g 12.5 mL/hr over 240 Minutes Intravenous Every 8 hours 08/30/18 0928 09/05/18 1001   08/28/18 1100  vancomycin (VANCOCIN) IVPB 750 mg/150 ml premix  Status:  Discontinued     750 mg 150 mL/hr over 60 Minutes Intravenous Every 24 hours 08/27/18 1225 08/28/18 1039   08/28/18 1045  vancomycin variable dose per unstable renal function (pharmacist dosing)  Status:  Discontinued      Does not apply See admin instructions 08/28/18 1045 08/28/18 1331   08/27/18 1800  cefTRIAXone (ROCEPHIN) 2 g in sodium chloride 0.9 % 100 mL IVPB  Status:  Discontinued     2 g 200 mL/hr over 30 Minutes Intravenous Every 24 hours 08/27/18 0759 08/30/18 0923   08/27/18 0900  vancomycin (VANCOCIN)  2,000 mg in sodium chloride 0.9 % 500 mL IVPB     2,000 mg 250 mL/hr over 120 Minutes Intravenous  Once 08/27/18 0811 08/27/18 1111   08/27/18 0515  cefTRIAXone (ROCEPHIN) 1 g in sodium chloride 0.9 % 100 mL IVPB     1 g 200 mL/hr over 30 Minutes Intravenous  Once 08/27/18 0502 08/27/18 0603   08/27/18 0500  vancomycin (VANCOCIN) IVPB 1000 mg/200 mL premix  Status:  Discontinued     1,000 mg 200 mL/hr over 60 Minutes Intravenous  Once 08/27/18 0446 08/27/18 0810       Assessment/Plan AKI on CKD-III - Cr 1.69 HTN Obesity   Left groin / thigh abscess S/p I&D Dr. Ninfa Linden 2/20 - POD7 - culture:ESCHERICHIA COLI -TID wet to dry dressing changes  ID -zosyn 2/19>>2/25, doxycycline 2/19>>2/21, vancomycin 2/16>>2/17, bactrim/augmentin 2/25>> FEN -reg diet VTE -SCDs, lovenox Foley -none  Plan- Medial wound with undrained fluctuance.  I have recommended return to OR for repeat drainage procedure.  Risks include bleeding, pain and need for further surgery.  I believe she understands this and agrees to proceed.     LOS: 12 days     Rosario Adie, MD  [facility]

## 2018-09-08 NOTE — Anesthesia Procedure Notes (Signed)
Procedure Name: Intubation Date/Time: 09/08/2018 9:43 AM Performed by: Glory Buff, CRNA Pre-anesthesia Checklist: Patient identified, Emergency Drugs available, Suction available and Patient being monitored Patient Re-evaluated:Patient Re-evaluated prior to induction Oxygen Delivery Method: Circle system utilized Preoxygenation: Pre-oxygenation with 100% oxygen Induction Type: IV induction Ventilation: Mask ventilation without difficulty Laryngoscope Size: Miller and 3 Grade View: Grade II Tube type: Oral Tube size: 7.0 mm Number of attempts: 1 Airway Equipment and Method: Stylet and Oral airway Placement Confirmation: ETT inserted through vocal cords under direct vision,  positive ETCO2 and breath sounds checked- equal and bilateral Secured at: 20 cm Tube secured with: Tape Dental Injury: Teeth and Oropharynx as per pre-operative assessment

## 2018-09-08 NOTE — Op Note (Signed)
09/08/2018  10:10 AM  PATIENT:  Caitlin Taylor  48 y.o. female  Patient Care Team: [name], MD as PCP - General (Internal Medicine)  PRE-OPERATIVE DIAGNOSIS:  MONS PUBIS ABSCESS  POST-OPERATIVE DIAGNOSIS:  MONS PUBIS ABSCESS  PROCEDURE:   INCISION AND DRAINAGE ABSCESS    Surgeon(s): Leighton Ruff, MD  ASSISTANT: none   ANESTHESIA:   local and general  EBL: 58ml  Total I/O In: -  Out: 10 [Blood:10]  DRAINS: none   SPECIMEN:  No Specimen  DISPOSITION OF SPECIMEN:  N/A  COUNTS:  YES  PLAN OF CARE: Pt already admitted  PATIENT DISPOSITION:  PACU - hemodynamically stable.  INDICATION: 48 y.o. F with large L groin abscess, drained and debrided last week.  She has developed new fluctuance at her mons pubis and is here for incision and drainage.   OR FINDINGS: ~2cm abscess mons pubis that does not connect with her other wounds  DESCRIPTION: the patient was identified in the preoperative holding area and taken to the OR where they were laid supine on the operating room table.  General anesthesia was induced without difficulty. SCDs were also noted to be in place prior to the initiation of anesthesia.  The patient was then prepped and draped in the usual sterile fashion.   A surgical timeout was performed indicating the correct patient, procedure, positioning and need for preoperative antibiotics.   I made a cruciate incision over the fluctuance.  Purulence was expressed.  The skin was enlarged for easier packing.  Hemostasis was achieved with cautery.  The other wounds were inspected and appeared to be clean and healing appropriately.  All wounds were packed with moist kerlex and covered with a dry dressing.

## 2018-09-08 NOTE — Anesthesia Postprocedure Evaluation (Signed)
Anesthesia Post Note  Patient: Caitlin Taylor  Procedure(s) Performed: INCISION AND DRAINAGE LEFT GROIN ABSCESS (Left Leg Upper)     Patient location during evaluation: PACU Anesthesia Type: General Level of consciousness: awake and alert Pain management: pain level controlled Vital Signs Assessment: post-procedure vital signs reviewed and stable Respiratory status: spontaneous breathing, nonlabored ventilation and respiratory function stable Cardiovascular status: blood pressure returned to baseline and stable Postop Assessment: no apparent nausea or vomiting Anesthetic complications: no    Last Vitals:  Vitals:   09/08/18 1100 09/08/18 1119  BP: (!) 145/93 (!) 140/94  Pulse: 72 73  Resp: 19 20  Temp: 36.9 C 36.8 C  SpO2: 95% 96%    Last Pain:  Vitals:   09/08/18 1119  TempSrc: Oral  PainSc:                  Brennan Bailey

## 2018-09-08 NOTE — Transfer of Care (Signed)
Immediate Anesthesia Transfer of Care Note  Patient: Caitlin Taylor  Procedure(s) Performed: INCISION AND DRAINAGE LEFT GROIN ABSCESS (Left Leg Upper)  Patient Location: PACU  Anesthesia Type:General  Level of Consciousness: awake, alert  and oriented  Airway & Oxygen Therapy: Patient Spontanous Breathing and Patient connected to face mask oxygen  Post-op Assessment: Report given to RN and Post -op Vital signs reviewed and stable  Post vital signs: Reviewed and stable  Last Vitals:  Vitals Value Taken Time  BP 138/98 09/08/2018 10:20 AM  Temp    Pulse 77 09/08/2018 10:21 AM  Resp 14 09/08/2018 10:21 AM  SpO2 96 % 09/08/2018 10:21 AM  Vitals shown include unvalidated device data.  Last Pain:  Vitals:   09/08/18 0801  TempSrc: Oral  PainSc: 0-No pain      Patients Stated Pain Goal: 3 (61/25/48 3234)  Complications: No apparent anesthesia complications

## 2018-09-09 DIAGNOSIS — N170 Acute kidney failure with tubular necrosis: Secondary | ICD-10-CM

## 2018-09-09 LAB — BASIC METABOLIC PANEL
Anion gap: 7 (ref 5–15)
BUN: 17 mg/dL (ref 6–20)
CO2: 24 mmol/L (ref 22–32)
CREATININE: 1.66 mg/dL — AB (ref 0.44–1.00)
Calcium: 8.6 mg/dL — ABNORMAL LOW (ref 8.9–10.3)
Chloride: 111 mmol/L (ref 98–111)
GFR calc non Af Amer: 36 mL/min — ABNORMAL LOW (ref >60)
GFR, EST AFRICAN AMERICAN: 42 mL/min — AB (ref >60)
Glucose, Bld: 118 mg/dL — ABNORMAL HIGH (ref 70–99)
Potassium: 4.6 mmol/L (ref 3.5–5.1)
Sodium: 142 mmol/L (ref 135–145)

## 2018-09-09 LAB — CBC
HCT: 34.7 % — ABNORMAL LOW (ref 36.0–46.0)
HEMOGLOBIN: 10.7 g/dL — AB (ref 12.0–15.0)
MCH: 30.3 pg (ref 26.0–34.0)
MCHC: 30.8 g/dL (ref 30.0–36.0)
MCV: 98.3 fL (ref 80.0–100.0)
Platelets: 507 10*3/uL — ABNORMAL HIGH (ref 150–400)
RBC: 3.53 MIL/uL — ABNORMAL LOW (ref 3.87–5.11)
RDW: 15.4 % (ref 11.5–15.5)
WBC: 22.4 10*3/uL — AB (ref 4.0–10.5)
nRBC: 0 % (ref 0.0–0.2)

## 2018-09-09 MED ORDER — CIPROFLOXACIN IN D5W 400 MG/200ML IV SOLN
400.0000 mg | Freq: Two times a day (BID) | INTRAVENOUS | Status: DC
  Administered 2018-09-09: 400 mg via INTRAVENOUS
  Filled 2018-09-09: qty 200

## 2018-09-09 MED ORDER — CIPROFLOXACIN IN D5W 400 MG/200ML IV SOLN
400.0000 mg | Freq: Two times a day (BID) | INTRAVENOUS | Status: DC
  Administered 2018-09-10 (×2): 400 mg via INTRAVENOUS
  Filled 2018-09-09 (×2): qty 200

## 2018-09-09 NOTE — Progress Notes (Signed)
[facility] Progress Note  1 Day Post-Op  Subjective: CC- Feels good today Objective: Vital signs in last 24 hours: Temp:  [98.2 F (36.8 C)-98.5 F (36.9 C)] 98.2 F (36.8 C) (02/29 0544) Pulse Rate:  [73-87] 75 (02/29 0544) Resp:  [17-20] 18 (02/29 0544) BP: (118-140)/(71-94) 137/79 (02/29 0544) SpO2:  [96 %-97 %] 97 % (02/29 0544) Last BM Date: 09/06/18  Intake/Output from previous day: 02/28 0701 - 02/29 0700 In: 2404.9 [P.O.:480; I.V.:1874.9; IV Piggyback:50] Out: 1110 [Urine:1100; Blood:10] Intake/Output this shift: No intake/output data recorded.  PE: Gen: Alert, NAD, pleasant HEENT: EOM's intact, pupils equal and round Skin: warm and dry Ext: dressing intact  Lab Results:  Recent Labs    09/07/18 0338 09/09/18 0419  WBC 13.9* 22.4*  HGB 11.3* 10.7*  HCT 36.4 34.7*  PLT 327 507*   BMET Recent Labs    09/07/18 0338 09/09/18 0419  NA 142 142  K 4.2 4.6  CL 107 111  CO2 26 24  GLUCOSE 91 118*  BUN 16 17  CREATININE 1.69* 1.66*  CALCIUM 8.5* 8.6*   PT/INR No results for input(s): LABPROT, INR in the last 72 hours. CMP     Component Value Date/Time   NA 142 09/09/2018 0419   K 4.6 09/09/2018 0419   CL 111 09/09/2018 0419   CO2 24 09/09/2018 0419   GLUCOSE 118 (H) 09/09/2018 0419   BUN 17 09/09/2018 0419   CREATININE 1.66 (H) 09/09/2018 0419   CALCIUM 8.6 (L) 09/09/2018 0419   PROT 5.9 (L) 08/28/2018 0357   ALBUMIN 2.9 (L) 08/28/2018 0357   AST 20 08/28/2018 0357   ALT 38 08/28/2018 0357   ALKPHOS 81 08/28/2018 0357   BILITOT 0.9 08/28/2018 0357   GFRNONAA 36 (L) 09/09/2018 0419   GFRAA 42 (L) 09/09/2018 0419   Lipase  No results found for: LIPASE     Studies/Results: No results found.  Anti-infectives: Anti-infectives (From admission, onward)   Start     Dose/Rate Route Frequency Ordered Stop   09/08/18 0600  ceFAZolin (ANCEF) 3 g in dextrose 5 % 50 mL IVPB     3 g 100 mL/hr over 30 Minutes Intravenous On  call to O.R. 09/07/18 1319 09/08/18 1014   09/05/18 1030  sulfamethoxazole-trimethoprim (BACTRIM DS,SEPTRA DS) 800-160 MG per tablet 1 tablet     1 tablet Oral Every 12 hours 09/05/18 1001     09/05/18 1030  amoxicillin-clavulanate (AUGMENTIN) 875-125 MG per tablet 1 tablet     1 tablet Oral Every 12 hours 09/05/18 1001     08/30/18 2000  doxycycline (VIBRAMYCIN) 100 mg in sodium chloride 0.9 % 250 mL IVPB  Status:  Discontinued     100 mg 125 mL/hr over 120 Minutes Intravenous Every 12 hours 08/30/18 1944 09/01/18 1600   08/30/18 1000  piperacillin-tazobactam (ZOSYN) IVPB 3.375 g  Status:  Discontinued     3.375 g 12.5 mL/hr over 240 Minutes Intravenous Every 8 hours 08/30/18 0928 09/05/18 1001   08/28/18 1100  vancomycin (VANCOCIN) IVPB 750 mg/150 ml premix  Status:  Discontinued     750 mg 150 mL/hr over 60 Minutes Intravenous Every 24 hours 08/27/18 1225 08/28/18 1039   08/28/18 1045  vancomycin variable dose per unstable renal function (pharmacist dosing)  Status:  Discontinued      Does not apply See admin instructions 08/28/18 1045 08/28/18 1331   08/27/18 1800  cefTRIAXone (ROCEPHIN) 2 g in sodium chloride 0.9 % 100 mL IVPB  Status:  Discontinued     2 g 200 mL/hr over 30 Minutes Intravenous Every 24 hours 08/27/18 0759 08/30/18 0923   08/27/18 0900  vancomycin (VANCOCIN) 2,000 mg in sodium chloride 0.9 % 500 mL IVPB     2,000 mg 250 mL/hr over 120 Minutes Intravenous  Once 08/27/18 0811 08/27/18 1111   08/27/18 0515  cefTRIAXone (ROCEPHIN) 1 g in sodium chloride 0.9 % 100 mL IVPB     1 g 200 mL/hr over 30 Minutes Intravenous  Once 08/27/18 0502 08/27/18 0603   08/27/18 0500  vancomycin (VANCOCIN) IVPB 1000 mg/200 mL premix  Status:  Discontinued     1,000 mg 200 mL/hr over 60 Minutes Intravenous  Once 08/27/18 0446 08/27/18 0810       Assessment/Plan AKI on CKD-III - Cr 1.69 HTN Obesity   Left groin / thigh abscess S/p I&D Dr. Ninfa Linden 2/20 - POD8 & 1 -  culture:ESCHERICHIA COLI -BID wet to dry dressing changes  ID -zosyn 2/19>>2/25, doxycycline 2/19>>2/21, vancomycin 2/16>>2/17, bactrim/augmentin 2/25>> FEN -reg diet VTE -SCDs, lovenox Foley -none  Plan- Medial wound drained in OR yesterday.  Other wounds granulating well.     LOS: 13 days     Rosario Adie, MD  [facility]

## 2018-09-09 NOTE — Progress Notes (Addendum)
[facility]                                                                              Patient Demographics  Caitlin Taylor, is a 48 y.o. female, DOB - [birth year], HYQ:[id number]  Admit date - 08/27/2018   Admitting Physician Debbe Odea, MD  Outpatient Primary MD for the patient is [name], MD  Outpatient specialists:   LOS - 13  days   Medical records reviewed and are as summarized below:    Chief Complaint  Patient presents with  . Abdominal Pain       Brief summary   Jenaya A Townsendis a 48 y.o.femalewith medical history significant ofDVT and PE, hypertension, chronic kidney disease, obesity presenting with left groin pain and cellulitis.  General surgery consulted and patient underwent incision and drainage x2.  Patient was placed on IV Zosyn and has been transitioned to oral Bactrim and Augmentin.  ID also consulted and following.  Assessment & Plan   Left groin abscess and cellulitis  -Status post I&D bedside of the left groin abscess on 2/17, cultures grew out E. Coli. -IV antibiotics were changed to IV Zosyn per general surgery -Underwent I&D of left thigh abscess and left groin abscess with cysts cultures sent off on 2/20 -Initial cultures from bedside I&D showed E. coli and Streptococcus G, cultures from I&D on 2/20 showed E. coli and Streptococcus group F. -ID was consulted, patient was seen by Dr. Bridget Hartshorn, IV Zosyn has been transitioned to oral Bactrim and oral Augmentin to complete course of 10 to 14 days. -Underwent I&D again on 09/08/2018 -Dressing changes and further recommendations per surgery -Leukocytosis trended up to 22.4 today, reviewed cultures and sensitivity from prior I&D, change antibiotics to Bactrim and IV ciprofloxacin  Acute kidney injury on CKD stage III -IV vancomycin has been discontinued, creatinine plateaued at 2.4 -Creatinine plateaued at 1.6, likely trended up due to Bactrim. -Continue to hold  Spironolactone and torsemide today  Essential hypertension -Continue metoprolol  Left adrenal mass Incidentally noted on CT, will need outpatient follow-up with MRI and further work-up  History of gout Continue colchicine, allopurinol Continue colchicine to 0.6 mg daily  Code Status: Full code DVT Prophylaxis:  Lovenox  Family Communication: Discussed in detail with the patient, all imaging results, lab results explained to the patient    Disposition Plan:   Time Spent in minutes 25 minutes  Procedures:   CT abdomen and pelvis 08/27/2018  Bedside I&D 08/28/2018 per general surgery--Kelly Rayburn, PA general surgery  Incision and drainage abscess left thigh and left groin per Dr. Ninfa Linden 08/31/2018   Chest x-ray 09/01/2018  INCISION AND DRAINAGE ABSCESS, mons pubis, 09/08/2018 by Dr. Marcello Moores   Consultants:    General surgery: Dr. Ninfa Linden 08/28/2018  Infectious disease: Dr. Megan Salon 09/01/2018   Antimicrobials:   Anti-infectives (From admission, onward)   Start     Dose/Rate Route Frequency Ordered Stop   09/08/18 0600  ceFAZolin (ANCEF) 3 g in dextrose 5 % 50 mL IVPB     3 g 100 mL/hr over 30 Minutes Intravenous On call to O.R. 09/07/18 1319 09/08/18 1014   09/05/18  1030  sulfamethoxazole-trimethoprim (BACTRIM DS,SEPTRA DS) 800-160 MG per tablet 1 tablet     1 tablet Oral Every 12 hours 09/05/18 1001     09/05/18 1030  amoxicillin-clavulanate (AUGMENTIN) 875-125 MG per tablet 1 tablet     1 tablet Oral Every 12 hours 09/05/18 1001     08/30/18 2000  doxycycline (VIBRAMYCIN) 100 mg in sodium chloride 0.9 % 250 mL IVPB  Status:  Discontinued     100 mg 125 mL/hr over 120 Minutes Intravenous Every 12 hours 08/30/18 1944 09/01/18 1600   08/30/18 1000  piperacillin-tazobactam (ZOSYN) IVPB 3.375 g  Status:  Discontinued     3.375 g 12.5 mL/hr over 240 Minutes Intravenous Every 8 hours 08/30/18 0928 09/05/18 1001   08/28/18 1100  vancomycin (VANCOCIN) IVPB 750 mg/150 ml  premix  Status:  Discontinued     750 mg 150 mL/hr over 60 Minutes Intravenous Every 24 hours 08/27/18 1225 08/28/18 1039   08/28/18 1045  vancomycin variable dose per unstable renal function (pharmacist dosing)  Status:  Discontinued      Does not apply See admin instructions 08/28/18 1045 08/28/18 1331   08/27/18 1800  cefTRIAXone (ROCEPHIN) 2 g in sodium chloride 0.9 % 100 mL IVPB  Status:  Discontinued     2 g 200 mL/hr over 30 Minutes Intravenous Every 24 hours 08/27/18 0759 08/30/18 0923   08/27/18 0900  vancomycin (VANCOCIN) 2,000 mg in sodium chloride 0.9 % 500 mL IVPB     2,000 mg 250 mL/hr over 120 Minutes Intravenous  Once 08/27/18 0811 08/27/18 1111   08/27/18 0515  cefTRIAXone (ROCEPHIN) 1 g in sodium chloride 0.9 % 100 mL IVPB     1 g 200 mL/hr over 30 Minutes Intravenous  Once 08/27/18 0502 08/27/18 0603   08/27/18 0500  vancomycin (VANCOCIN) IVPB 1000 mg/200 mL premix  Status:  Discontinued     1,000 mg 200 mL/hr over 60 Minutes Intravenous  Once 08/27/18 0446 08/27/18 0810         Medications  Scheduled Meds: . allopurinol  100 mg Oral Daily  . amoxicillin-clavulanate  1 tablet Oral Q12H  . colchicine  0.6 mg Oral Daily  . enoxaparin (LOVENOX) injection  40 mg Subcutaneous Q24H  . metoprolol tartrate  12.5 mg Oral BID  . polyethylene glycol  17 g Oral Daily  . sulfamethoxazole-trimethoprim  1 tablet Oral Q12H   Continuous Infusions: . sodium chloride Stopped (08/31/18 0229)  . lactated ringers 50 mL/hr at 09/09/18 1200   PRN Meds:.sodium chloride, acetaminophen **OR** acetaminophen, morphine injection, ondansetron (ZOFRAN) IV, oxyCODONE      Subjective:   Caitlin Taylor was seen and examined today.  No fevers, feeling better today.  Patient denies dizziness, chest pain, shortness of breath, abdominal pain, N/V/D/C, new weakness, numbess, tingling.  No acute issues overnight.  Objective:   Vitals:   09/08/18 1119 09/08/18 1331 09/08/18 2052 09/09/18  0544  BP: (!) 140/94 123/82 118/71 137/79  Pulse: 73 77 87 75  Resp: 20 18 17 18   Temp: 98.2 F (36.8 C) 98.5 F (36.9 C) 98.3 F (36.8 C) 98.2 F (36.8 C)  TempSrc: Oral Oral Oral Oral  SpO2: 96% 97% 96% 97%  Weight:      Height:        Intake/Output Summary (Last 24 hours) at 09/09/2018 1234 Last data filed at 09/09/2018 1200 Gross per 24 hour  Intake 1729.75 ml  Output 1100 ml  Net 629.75 ml     Wt  Readings from Last 3 Encounters:  09/08/18 128.7 kg  06/26/18 125.2 kg  11/17/17 121.1 kg   Physical Exam  General: Alert and oriented x 3, NAD  Eyes:   HEENT:    Cardiovascular: S1 S2 clear,  RRR. No pedal edema b/l  Respiratory: CTAB, no wheezing, rales or rhonchi  Gastrointestinal: Soft, nontender, nondistended, NBS  Ext: no pedal edema bilaterally  Neuro: no new deficits  Musculoskeletal: No cyanosis, clubbing  Skin: Dressing intact on the left thigh wound and groin  Psych: Normal affect and demeanor, alert and oriented x3     Data Reviewed:  I have personally reviewed following labs and imaging studies  Micro Results Recent Results (from the past 240 hour(s))  Aerobic/Anaerobic Culture (surgical/deep wound)     Status: None   Collection Time: 08/31/18 10:20 AM  Result Value Ref Range Status   Specimen Description   Final    SFT TISS OTH Performed at [facility] [address] [place], [place] [address]    Special Requests   Final    NONE Performed at [facility], [place] [address] [place], [place] [address]    Gram Stain   Final    FEW WBC PRESENT, PREDOMINANTLY PMN NO ORGANISMS SEEN    Culture   Final    FEW ESCHERICHIA COLI CRITICAL RESULT CALLED TO, READ BACK BY AND VERIFIED WITH: C. HUGHEY, RN ([facility]) CONCERNING CULTURE GROWTH AT 1750 ON 09/02/18 BY C. JESSUP, MLT. RARE STREPTOCOCCUS GROUP F RARE PREVOTELLA SPECIES BETA LACTAMASE NEGATIVE Performed at [facility], [place] [address]  [place], [place] [address]    Report Status 09/05/2018 FINAL  Final   Organism ID, Bacteria ESCHERICHIA COLI  Final      Susceptibility   Escherichia coli - MIC*    AMPICILLIN >=32 RESISTANT Resistant     CEFAZOLIN 8 SENSITIVE Sensitive     CEFEPIME <=1 SENSITIVE Sensitive     CEFTAZIDIME <=1 SENSITIVE Sensitive     CEFTRIAXONE <=1 SENSITIVE Sensitive     CIPROFLOXACIN <=0.25 SENSITIVE Sensitive     GENTAMICIN <=1 SENSITIVE Sensitive     IMIPENEM <=0.25 SENSITIVE Sensitive     TRIMETH/SULFA <=20 SENSITIVE Sensitive     AMPICILLIN/SULBACTAM >=32 RESISTANT Resistant     PIP/TAZO 8 SENSITIVE Sensitive     Extended ESBL NEGATIVE Sensitive     * FEW ESCHERICHIA COLI  Culture, blood (Routine X 2) w Reflex to ID Panel     Status: None   Collection Time: 09/01/18  9:30 AM  Result Value Ref Range Status   Specimen Description   Final    BLOOD LEFT HAND Performed at [facility] [address], [place], [place] [address]    Special Requests   Final    BOTTLES DRAWN AEROBIC ONLY Blood Culture results may not be optimal due to an inadequate volume of blood received in culture bottles Performed at [facility] [address], [place], [place] [address]    Culture   Final    NO GROWTH 5 DAYS Performed at [facility], [place] [address] [place],  [address]    Report Status 09/06/2018 FINAL  Final  Culture, blood (Routine X 2) w Reflex to ID Panel     Status: None   Collection Time: 09/01/18  9:32 AM  Result Value Ref Range Status   Specimen Description   Final    BLOOD RIGHT HAND Performed at [facility], [place] [address], [place], [place] [address]    Special Requests   Final    BOTTLES DRAWN AEROBIC ONLY Blood Culture adequate volume Performed at [facility] [address], [place], [place] [address]    Culture   Final    NO GROWTH 5 DAYS Performed at [facility], [place] [address],  [place], [place] [address]    Report Status 09/06/2018 FINAL  Final  Culture, Urine     Status: Abnormal   Collection Time: 09/01/18  3:23 PM  Result Value Ref Range Status   Specimen Description   Final    URINE, CLEAN CATCH Performed at [facility], [place] [address], [place], [place] [address]    Special Requests   Final    NONE Performed at [facility], [place] [address], [place], [place] [address]    Culture 20,000 COLONIES/mL ENTEROBACTER AEROGENES (A)  Final   Report Status 09/03/2018 FINAL  Final   Organism ID, Bacteria ENTEROBACTER AEROGENES (A)  Final      Susceptibility   Enterobacter aerogenes - MIC*    CEFAZOLIN >=64 RESISTANT Resistant     CEFTRIAXONE 16 INTERMEDIATE Intermediate     CIPROFLOXACIN <=0.25 SENSITIVE Sensitive     GENTAMICIN <=1 SENSITIVE Sensitive     IMIPENEM <=0.25 SENSITIVE Sensitive     NITROFURANTOIN 128 RESISTANT Resistant     TRIMETH/SULFA <=20 SENSITIVE Sensitive     PIP/TAZO >=128 RESISTANT Resistant     * 20,000 COLONIES/mL ENTEROBACTER AEROGENES    Radiology Reports Dg Chest 2 View  Result Date: 09/01/2018 CLINICAL DATA:  Shortness of breath.  Leukocytosis. EXAM: CHEST - 2 VIEW COMPARISON:  Chest CT 09/03/2016 FINDINGS: Cardiomediastinal silhouette is normal. Mediastinal contours appear intact. There is no evidence of focal airspace consolidation, pleural effusion or pneumothorax. Osseous structures are without acute abnormality. Soft tissues are grossly normal. IMPRESSION: No active cardiopulmonary disease. Electronically Signed   By: Fidela Salisbury M.D.   On: 09/01/2018 11:12   Ct Abdomen Pelvis W Contrast  Addendum Date: 08/27/2018   ADDENDUM REPORT: 08/27/2018 04:32 ADDENDUM: Add to IMPRESSION: Thickened urinary bladder wall, a finding felt to be indicative of a degree of cystitis. Electronically Signed   By: Lowella Grip III M.D.   On: 08/27/2018 04:32   Result Date: 08/27/2018 CLINICAL DATA:   Abdominal pain, primarily left-sided EXAM: CT ABDOMEN AND PELVIS WITH CONTRAST TECHNIQUE: Multidetector CT imaging of the abdomen and pelvis was performed using the standard protocol following bolus administration of intravenous contrast. CONTRAST:  62mL ISOVUE-300 IOPAMIDOL (ISOVUE-300) INJECTION 61% COMPARISON:  None. FINDINGS: Lower chest: There is bibasilar atelectatic change. Hepatobiliary: No focal liver lesions are evident. Gallbladder appears contracted. No biliary duct dilatation evident. Pancreas: No pancreatic mass or inflammatory focus. Spleen: No splenic lesions are evident. Adrenals/Urinary Tract: Right adrenal appears normal. There is a mass arising from the left adrenal measuring 2.3 x 2.2 cm. The attenuation of this mass does not indicate that it represents an adenoma. There is a 4 mm probable cyst in the upper pole left kidney. There is no evident hydronephrosis on either side. There is no evident renal or ureteral calculus on either side. The urinary bladder wall is thickened. Stomach/Bowel: The rectum is borderline distended with stool. There is no bowel wall or mesenteric thickening. No diverticulitis evident. There is no bowel obstruction. There is no evident free air or portal venous air. There is lipomatous infiltration of the ileocecal valve. Vascular/Lymphatic:  There is no abdominal aortic aneurysm. No vascular lesions are appreciable. There is no adenopathy in the abdomen or pelvis. Reproductive: The uterus is anteverted.  No pelvic mass is evident. Other: Appendix appears unremarkable. No abscess or ascites is evident in the peritoneum or retroperitoneum of the abdomen and pelvis. There is a small ventral hernia containing only fat. There is soft tissue stranding in the left inguinal region. There is ill-defined fluid in the subcutaneous region of the left inguinal region this ill-defined collection measures 3.8 x 3.6 x 3.3 cm. Musculoskeletal: There is postoperative change in the lumbar  spine at L4 and L5. There is degenerative change in the lower lumbar spine. There are no blastic or lytic bone lesions. No intramuscular lesions are evident. IMPRESSION: 1. There is inflammatory change with apparent developing phlegmon in the medial upper left thigh/left inguinal region. No air is seen in this area. 2. No evident diverticulitis. No bowel obstruction or bowel wall thickening. No abscess within the peritoneum or retroperitoneum of the abdomen and pelvis. Appendix appears normal. 3. There is a left adrenal mass measuring 2.3 x 2.2 cm. Etiology uncertain. Nonemergent adrenal MR to further evaluate this mass pre and post-contrast may well be advisable. 4.  No evident renal or ureteral calculus.  No hydronephrosis. 5.  Postoperative change at L4 and L5. Electronically Signed: By: Lowella Grip III M.D. On: 08/27/2018 04:25    Lab Data:  CBC: Recent Labs  Lab 09/03/18 0515 09/04/18 1761 09/05/18 0457 09/06/18 0359 09/07/18 0338 09/09/18 0419  WBC 18.3* 19.1* 14.5* 13.5* 13.9* 22.4*  NEUTROABS 14.9* 16.0* 11.8* 11.6*  --   --   HGB 10.3* 10.0* 10.5* 10.0* 11.3* 10.7*  HCT 32.4* 31.4* 32.7* 32.8* 36.4 34.7*  MCV 96.1 96.3 96.7 98.5 98.4 98.3  PLT 187 204 229 257 327 607*   Basic Metabolic Panel: Recent Labs  Lab 09/04/18 0558 09/05/18 0457 09/06/18 0359 09/07/18 0338 09/09/18 0419  NA 140 140 139 142 142  K 4.6 4.3 4.3 4.2 4.6  CL 109 108 108 107 111  CO2 23 26 24 26 24   GLUCOSE 94 104* 104* 91 118*  BUN 16 15 13 16 17   CREATININE 1.62* 1.59* 1.57* 1.69* 1.66*  CALCIUM 8.2* 8.4* 8.2* 8.5* 8.6*   GFR: Estimated Creatinine Clearance: 54.8 mL/min (A) (by C-G formula based on SCr of 1.66 mg/dL (H)). Liver Function Tests: No results for input(s): AST, ALT, ALKPHOS, BILITOT, PROT, ALBUMIN in the last 168 hours. No results for input(s): LIPASE, AMYLASE in the last 168 hours. No results for input(s): AMMONIA in the last 168 hours. Coagulation Profile: No results for  input(s): INR, PROTIME in the last 168 hours. Cardiac Enzymes: No results for input(s): CKTOTAL, CKMB, CKMBINDEX, TROPONINI in the last 168 hours. BNP (last 3 results) No results for input(s): PROBNP in the last 8760 hours. HbA1C: No results for input(s): HGBA1C in the last 72 hours. CBG: No results for input(s): GLUCAP in the last 168 hours. Lipid Profile: No results for input(s): CHOL, HDL, LDLCALC, TRIG, CHOLHDL, LDLDIRECT in the last 72 hours. Thyroid Function Tests: No results for input(s): TSH, T4TOTAL, FREET4, T3FREE, THYROIDAB in the last 72 hours. Anemia Panel: No results for input(s): VITAMINB12, FOLATE, FERRITIN, TIBC, IRON, RETICCTPCT in the last 72 hours. Urine analysis:    Component Value Date/Time   COLORURINE YELLOW 09/01/2018 1523   APPEARANCEUR HAZY (A) 09/01/2018 1523   LABSPEC 1.016 09/01/2018 1523   PHURINE 5.0 09/01/2018 1523   GLUCOSEU NEGATIVE  09/01/2018 1523   HGBUR SMALL (A) 09/01/2018 1523   BILIRUBINUR NEGATIVE 09/01/2018 1523   [place] 09/01/2018 1523   PROTEINUR 30 (A) 09/01/2018 1523   NITRITE NEGATIVE 09/01/2018 1523   LEUKOCYTESUR TRACE (A) 09/01/2018 1523       M.D. [facility] 09/09/2018, 12:34 PM  Pager: [phone] Between 7am to 7pm - call Pager - [phone]  After 7pm go to [email or web address] - password [email or web address]  Call night coverage person covering after 7pm

## 2018-09-10 LAB — CBC
HCT: 32 % — ABNORMAL LOW (ref 36.0–46.0)
Hemoglobin: 9.9 g/dL — ABNORMAL LOW (ref 12.0–15.0)
MCH: 30.8 pg (ref 26.0–34.0)
MCHC: 30.9 g/dL (ref 30.0–36.0)
MCV: 99.7 fL (ref 80.0–100.0)
NRBC: 0 % (ref 0.0–0.2)
Platelets: 386 10*3/uL (ref 150–400)
RBC: 3.21 MIL/uL — ABNORMAL LOW (ref 3.87–5.11)
RDW: 15.7 % — ABNORMAL HIGH (ref 11.5–15.5)
WBC: 13.4 10*3/uL — AB (ref 4.0–10.5)

## 2018-09-10 LAB — BASIC METABOLIC PANEL
ANION GAP: 5 (ref 5–15)
BUN: 14 mg/dL (ref 6–20)
CO2: 23 mmol/L (ref 22–32)
Calcium: 8.4 mg/dL — ABNORMAL LOW (ref 8.9–10.3)
Chloride: 112 mmol/L — ABNORMAL HIGH (ref 98–111)
Creatinine, Ser: 1.51 mg/dL — ABNORMAL HIGH (ref 0.44–1.00)
GFR calc Af Amer: 47 mL/min — ABNORMAL LOW (ref >60)
GFR calc non Af Amer: 41 mL/min — ABNORMAL LOW (ref >60)
Glucose, Bld: 103 mg/dL — ABNORMAL HIGH (ref 70–99)
Potassium: 4.7 mmol/L (ref 3.5–5.1)
Sodium: 140 mmol/L (ref 135–145)

## 2018-09-10 MED ORDER — CIPROFLOXACIN IN D5W 400 MG/200ML IV SOLN
400.0000 mg | Freq: Two times a day (BID) | INTRAVENOUS | Status: DC
  Administered 2018-09-10 – 2018-09-11 (×2): 400 mg via INTRAVENOUS
  Filled 2018-09-10: qty 200

## 2018-09-10 NOTE — Progress Notes (Signed)
IV therapy in room to start IV site. Caitlin Taylor has limited sites.  Considering starting Mid-line. Will continue to monitor and assess this patient needs and concerns. Will check with MD for possible PICC line placement

## 2018-09-10 NOTE — Progress Notes (Signed)
[facility]                                                                              Patient Demographics  Caitlin Taylor, is a 48 y.o. female, DOB - 1971-05-01, PXT:[id number]  Admit date - 08/27/2018   Admitting Physician Debbe Odea, MD  Outpatient Primary MD for the patient is [name], MD  Outpatient specialists:   LOS - 14  days   Medical records reviewed and are as summarized below:    Chief Complaint  Patient presents with  . Abdominal Pain       Brief summary   Caitlin A Townsendis a 48 y.o.femalewith medical history significant ofDVT and PE, hypertension, chronic kidney disease, obesity presenting with left groin pain and cellulitis.  General surgery consulted and patient underwent incision and drainage x2.  Patient was placed on IV Zosyn and has been transitioned to oral Bactrim and Augmentin.  ID also consulted and following.  Assessment & Plan   Left groin abscess and cellulitis  -Status post I&D bedside of the left groin abscess on 2/17, cultures grew out E. Coli. -IV antibiotics were changed to IV Zosyn per general surgery -Underwent I&D of left thigh abscess and left groin abscess with cysts cultures sent off on 2/20 -Initial cultures from bedside I&D showed E. coli and Streptococcus G, cultures from I&D on 2/20 showed E. coli and Streptococcus group F. -ID was consulted, patient was seen by Dr. Bridget Hartshorn, IV Zosyn has been transitioned to oral Bactrim and oral Augmentin to complete course of 10 to 14 days. -Underwent I&D again on 09/08/2018 -Dressing changes and further recommendations per surgery -Continue IV ciprofloxacin and Bactrim, leukocytosis improving 22.4--> 13.4  Acute kidney injury on CKD stage III -IV vancomycin has been discontinued, creatinine plateaued at 2.4 -Creatinine plateaued at 1.6, likely trended up due to Bactrim. -Creatinine now trending down, 1.5  Essential hypertension -Continue metoprolol  Left  adrenal mass Incidentally noted on CT, will need outpatient follow-up with MRI and further work-up  History of gout Continue colchicine, allopurinol  Code Status: Full code DVT Prophylaxis:  Lovenox  Family Communication: Discussed in detail with the patient, all imaging results, lab results explained to the patient    Disposition Plan: Once cleared by general surgery, hopefully tomorrow   Time Spent in minutes 25 minutes  Procedures:   CT abdomen and pelvis 08/27/2018  Bedside I&D 08/28/2018 per general surgery--Kelly Rayburn, PA general surgery  Incision and drainage abscess left thigh and left groin per Dr. Ninfa Linden 08/31/2018   Chest x-ray 09/01/2018  INCISION AND DRAINAGE ABSCESS, mons pubis, 09/08/2018 by Dr. Marcello Moores   Consultants:    General surgery: Dr. Ninfa Linden 08/28/2018  Infectious disease: Dr. Megan Salon 09/01/2018   Antimicrobials:   Anti-infectives (From admission, onward)   Start     Dose/Rate Route Frequency Ordered Stop   09/10/18 0400  ciprofloxacin (CIPRO) IVPB 400 mg     400 mg 200 mL/hr over 60 Minutes Intravenous Every 12 hours 09/09/18 1724     09/09/18 1400  ciprofloxacin (CIPRO) IVPB 400 mg  Status:  Discontinued     400 mg 200 mL/hr  over 60 Minutes Intravenous Every 12 hours 09/09/18 1238 09/09/18 1724   09/08/18 0600  ceFAZolin (ANCEF) 3 g in dextrose 5 % 50 mL IVPB     3 g 100 mL/hr over 30 Minutes Intravenous On call to O.R. 09/07/18 1319 09/08/18 1014   09/05/18 1030  sulfamethoxazole-trimethoprim (BACTRIM DS,SEPTRA DS) 800-160 MG per tablet 1 tablet     1 tablet Oral Every 12 hours 09/05/18 1001     09/05/18 1030  amoxicillin-clavulanate (AUGMENTIN) 875-125 MG per tablet 1 tablet  Status:  Discontinued     1 tablet Oral Every 12 hours 09/05/18 1001 09/09/18 1238   08/30/18 2000  doxycycline (VIBRAMYCIN) 100 mg in sodium chloride 0.9 % 250 mL IVPB  Status:  Discontinued     100 mg 125 mL/hr over 120 Minutes Intravenous Every 12 hours  08/30/18 1944 09/01/18 1600   08/30/18 1000  piperacillin-tazobactam (ZOSYN) IVPB 3.375 g  Status:  Discontinued     3.375 g 12.5 mL/hr over 240 Minutes Intravenous Every 8 hours 08/30/18 0928 09/05/18 1001   08/28/18 1100  vancomycin (VANCOCIN) IVPB 750 mg/150 ml premix  Status:  Discontinued     750 mg 150 mL/hr over 60 Minutes Intravenous Every 24 hours 08/27/18 1225 08/28/18 1039   08/28/18 1045  vancomycin variable dose per unstable renal function (pharmacist dosing)  Status:  Discontinued      Does not apply See admin instructions 08/28/18 1045 08/28/18 1331   08/27/18 1800  cefTRIAXone (ROCEPHIN) 2 g in sodium chloride 0.9 % 100 mL IVPB  Status:  Discontinued     2 g 200 mL/hr over 30 Minutes Intravenous Every 24 hours 08/27/18 0759 08/30/18 0923   08/27/18 0900  vancomycin (VANCOCIN) 2,000 mg in sodium chloride 0.9 % 500 mL IVPB     2,000 mg 250 mL/hr over 120 Minutes Intravenous  Once 08/27/18 0811 08/27/18 1111   08/27/18 0515  cefTRIAXone (ROCEPHIN) 1 g in sodium chloride 0.9 % 100 mL IVPB     1 g 200 mL/hr over 30 Minutes Intravenous  Once 08/27/18 0502 08/27/18 0603   08/27/18 0500  vancomycin (VANCOCIN) IVPB 1000 mg/200 mL premix  Status:  Discontinued     1,000 mg 200 mL/hr over 60 Minutes Intravenous  Once 08/27/18 0446 08/27/18 0810         Medications  Scheduled Meds: . allopurinol  100 mg Oral Daily  . colchicine  0.6 mg Oral Daily  . enoxaparin (LOVENOX) injection  40 mg Subcutaneous Q24H  . metoprolol tartrate  12.5 mg Oral BID  . polyethylene glycol  17 g Oral Daily  . sulfamethoxazole-trimethoprim  1 tablet Oral Q12H   Continuous Infusions: . sodium chloride Stopped (09/10/18 0453)  . ciprofloxacin Stopped (09/10/18 0553)  . lactated ringers 50 mL/hr at 09/10/18 1039   PRN Meds:.sodium chloride, acetaminophen **OR** acetaminophen, morphine injection, ondansetron (ZOFRAN) IV, oxyCODONE      Subjective:   Caitlin Taylor was seen and examined  today.  No fevers, no acute complaints.    Patient denies dizziness, chest pain, shortness of breath, abdominal pain, N/V/D/C, new weakness, numbess, tingling.  No acute issues overnight.  Objective:   Vitals:   09/09/18 0544 09/09/18 1432 09/09/18 2148 09/10/18 0530  BP: 137/79 121/72 (!) 142/90 134/83  Pulse: 75 76 71 64  Resp: 18 16 18 17   Temp: 98.2 F (36.8 C) 98.1 F (36.7 C) 98.4 F (36.9 C) 97.8 F (36.6 C)  TempSrc: Oral Oral Oral Oral  SpO2:  97% 99% 97% 97%  Weight:    134.9 kg  Height:        Intake/Output Summary (Last 24 hours) at 09/10/2018 1206 Last data filed at 09/10/2018 0933 Gross per 24 hour  Intake 625.25 ml  Output 600 ml  Net 25.25 ml     Wt Readings from Last 3 Encounters:  09/10/18 134.9 kg  06/26/18 125.2 kg  11/17/17 121.1 kg   Physical Exam  General: Alert and oriented x 3, NAD  Eyes:  HEENT:    Cardiovascular: S1 S2 clear,  RRR. No pedal edema b/l  Respiratory: CTAB, no wheezing, rales or rhonchi  Gastrointestinal: Soft, nontender, nondistended, NBS  Ext: no pedal edema bilaterally  Neuro: no new deficits  Musculoskeletal: No cyanosis, clubbing  Skin: Dressing intact on left thigh and groin wound  Psych: Normal affect and demeanor, alert and oriented x3       Data Reviewed:  I have personally reviewed following labs and imaging studies  Micro Results Recent Results (from the past 240 hour(s))  Culture, blood (Routine X 2) w Reflex to ID Panel     Status: None   Collection Time: 09/01/18  9:30 AM  Result Value Ref Range Status   Specimen Description   Final    BLOOD LEFT HAND Performed at [facility], [address] [place], [place] [address]    Special Requests   Final    BOTTLES DRAWN AEROBIC ONLY Blood Culture results may not be optimal due to an inadequate volume of blood received in culture bottles Performed at [facility] [address], [place], [place] [address]     Culture   Final    NO GROWTH 5 DAYS Performed at [facility], [place] [address], [place], [place] [address]    Report Status 09/06/2018 FINAL  Final  Culture, blood (Routine X 2) w Reflex to ID Panel     Status: None   Collection Time: 09/01/18  9:32 AM  Result Value Ref Range Status   Specimen Description   Final    BLOOD RIGHT HAND Performed at [facility] [address] [place], [place] [address]    Special Requests   Final    BOTTLES DRAWN AEROBIC ONLY Blood Culture adequate volume Performed at [facility] [address] [place], [place] [address]    Culture   Final    NO GROWTH 5 DAYS Performed at [facility], [place] [address] [place], [place] [address]    Report Status 09/06/2018 FINAL  Final  Culture, Urine     Status: Abnormal   Collection Time: 09/01/18  3:23 PM  Result Value Ref Range Status   Specimen Description   Final    URINE, CLEAN CATCH Performed at [facility], [place] [address], [place], [place] [address]    Special Requests   Final    NONE Performed at [facility], [place] [address] [place], [place] [address]    Culture 20,000 COLONIES/mL ENTEROBACTER AEROGENES (A)  Final   Report Status 09/03/2018 FINAL  Final   Organism ID, Bacteria ENTEROBACTER AEROGENES (A)  Final      Susceptibility   Enterobacter aerogenes - MIC*    CEFAZOLIN >=64 RESISTANT Resistant     CEFTRIAXONE 16 INTERMEDIATE Intermediate     CIPROFLOXACIN <=0.25 SENSITIVE Sensitive     GENTAMICIN <=1 SENSITIVE Sensitive     IMIPENEM <=0.25 SENSITIVE Sensitive  NITROFURANTOIN 128 RESISTANT Resistant     TRIMETH/SULFA <=20 SENSITIVE Sensitive     PIP/TAZO >=128 RESISTANT Resistant     * 20,000 COLONIES/mL ENTEROBACTER AEROGENES    Radiology Reports Dg Chest 2 View  Result Date: 09/01/2018 CLINICAL DATA:  Shortness of breath.  Leukocytosis. EXAM: CHEST - 2 VIEW COMPARISON:  Chest CT 09/03/2016  FINDINGS: Cardiomediastinal silhouette is normal. Mediastinal contours appear intact. There is no evidence of focal airspace consolidation, pleural effusion or pneumothorax. Osseous structures are without acute abnormality. Soft tissues are grossly normal. IMPRESSION: No active cardiopulmonary disease. Electronically Signed   By: Fidela Salisbury M.D.   On: 09/01/2018 11:12   Ct Abdomen Pelvis W Contrast  Addendum Date: 08/27/2018   ADDENDUM REPORT: 08/27/2018 04:32 ADDENDUM: Add to IMPRESSION: Thickened urinary bladder wall, a finding felt to be indicative of a degree of cystitis. Electronically Signed   By: Lowella Grip III M.D.   On: 08/27/2018 04:32   Result Date: 08/27/2018 CLINICAL DATA:  Abdominal pain, primarily left-sided EXAM: CT ABDOMEN AND PELVIS WITH CONTRAST TECHNIQUE: Multidetector CT imaging of the abdomen and pelvis was performed using the standard protocol following bolus administration of intravenous contrast. CONTRAST:  3mL ISOVUE-300 IOPAMIDOL (ISOVUE-300) INJECTION 61% COMPARISON:  None. FINDINGS: Lower chest: There is bibasilar atelectatic change. Hepatobiliary: No focal liver lesions are evident. Gallbladder appears contracted. No biliary duct dilatation evident. Pancreas: No pancreatic mass or inflammatory focus. Spleen: No splenic lesions are evident. Adrenals/Urinary Tract: Right adrenal appears normal. There is a mass arising from the left adrenal measuring 2.3 x 2.2 cm. The attenuation of this mass does not indicate that it represents an adenoma. There is a 4 mm probable cyst in the upper pole left kidney. There is no evident hydronephrosis on either side. There is no evident renal or ureteral calculus on either side. The urinary bladder wall is thickened. Stomach/Bowel: The rectum is borderline distended with stool. There is no bowel wall or mesenteric thickening. No diverticulitis evident. There is no bowel obstruction. There is no evident free air or portal venous air.  There is lipomatous infiltration of the ileocecal valve. Vascular/Lymphatic: There is no abdominal aortic aneurysm. No vascular lesions are appreciable. There is no adenopathy in the abdomen or pelvis. Reproductive: The uterus is anteverted.  No pelvic mass is evident. Other: Appendix appears unremarkable. No abscess or ascites is evident in the peritoneum or retroperitoneum of the abdomen and pelvis. There is a small ventral hernia containing only fat. There is soft tissue stranding in the left inguinal region. There is ill-defined fluid in the subcutaneous region of the left inguinal region this ill-defined collection measures 3.8 x 3.6 x 3.3 cm. Musculoskeletal: There is postoperative change in the lumbar spine at L4 and L5. There is degenerative change in the lower lumbar spine. There are no blastic or lytic bone lesions. No intramuscular lesions are evident. IMPRESSION: 1. There is inflammatory change with apparent developing phlegmon in the medial upper left thigh/left inguinal region. No air is seen in this area. 2. No evident diverticulitis. No bowel obstruction or bowel wall thickening. No abscess within the peritoneum or retroperitoneum of the abdomen and pelvis. Appendix appears normal. 3. There is a left adrenal mass measuring 2.3 x 2.2 cm. Etiology uncertain. Nonemergent adrenal MR to further evaluate this mass pre and post-contrast may well be advisable. 4.  No evident renal or ureteral calculus.  No hydronephrosis. 5.  Postoperative change at L4 and L5. Electronically Signed: By: Lowella Grip III M.D.  On: 08/27/2018 04:25    Lab Data:  CBC: Recent Labs  Lab 09/04/18 0558 09/05/18 0457 09/06/18 0359 09/07/18 0338 09/09/18 0419 09/10/18 0515  WBC 19.1* 14.5* 13.5* 13.9* 22.4* 13.4*  NEUTROABS 16.0* 11.8* 11.6*  --   --   --   HGB 10.0* 10.5* 10.0* 11.3* 10.7* 9.9*  HCT 31.4* 32.7* 32.8* 36.4 34.7* 32.0*  MCV 96.3 96.7 98.5 98.4 98.3 99.7  PLT 204 229 257 327 507* 017   Basic  Metabolic Panel: Recent Labs  Lab 09/05/18 0457 09/06/18 0359 09/07/18 0338 09/09/18 0419 09/10/18 0515  NA 140 139 142 142 140  K 4.3 4.3 4.2 4.6 4.7  CL 108 108 107 111 112*  CO2 26 24 26 24 23   GLUCOSE 104* 104* 91 118* 103*  BUN 15 13 16 17 14   CREATININE 1.59* 1.57* 1.69* 1.66* 1.51*  CALCIUM 8.4* 8.2* 8.5* 8.6* 8.4*   GFR: Estimated Creatinine Clearance: 62.1 mL/min (A) (by C-G formula based on SCr of 1.51 mg/dL (H)). Liver Function Tests: No results for input(s): AST, ALT, ALKPHOS, BILITOT, PROT, ALBUMIN in the last 168 hours. No results for input(s): LIPASE, AMYLASE in the last 168 hours. No results for input(s): AMMONIA in the last 168 hours. Coagulation Profile: No results for input(s): INR, PROTIME in the last 168 hours. Cardiac Enzymes: No results for input(s): CKTOTAL, CKMB, CKMBINDEX, TROPONINI in the last 168 hours. BNP (last 3 results) No results for input(s): PROBNP in the last 8760 hours. HbA1C: No results for input(s): HGBA1C in the last 72 hours. CBG: No results for input(s): GLUCAP in the last 168 hours. Lipid Profile: No results for input(s): CHOL, HDL, LDLCALC, TRIG, CHOLHDL, LDLDIRECT in the last 72 hours. Thyroid Function Tests: No results for input(s): TSH, T4TOTAL, FREET4, T3FREE, THYROIDAB in the last 72 hours. Anemia Panel: No results for input(s): VITAMINB12, FOLATE, FERRITIN, TIBC, IRON, RETICCTPCT in the last 72 hours. Urine analysis:    Component Value Date/Time   COLORURINE YELLOW 09/01/2018 1523   APPEARANCEUR HAZY (A) 09/01/2018 1523   LABSPEC 1.016 09/01/2018 1523   PHURINE 5.0 09/01/2018 1523   GLUCOSEU NEGATIVE 09/01/2018 1523   HGBUR SMALL (A) 09/01/2018 1523   BILIRUBINUR NEGATIVE 09/01/2018 1523   KETONESUR NEGATIVE 09/01/2018 1523   PROTEINUR 30 (A) 09/01/2018 1523   NITRITE NEGATIVE 09/01/2018 1523   LEUKOCYTESUR TRACE (A) 09/01/2018 1523     Caitlin Taylor M.D. [facility] 09/10/2018, 12:06 PM  Pager:  [phone] Between 7am to 7pm - call Pager - [phone]  After 7pm go to [email or web address] - password [email or web address]  Call night coverage person covering after 7pm

## 2018-09-11 ENCOUNTER — Encounter (HOSPITAL_COMMUNITY): Payer: Self-pay | Admitting: General Surgery

## 2018-09-11 MED ORDER — SPIRONOLACTONE 25 MG PO TABS: 25.0000 mg | ORAL_TABLET | Freq: Every day | ORAL | Status: AC

## 2018-09-11 MED ORDER — CIPROFLOXACIN HCL 500 MG PO TABS: 500.0000 mg | ORAL_TABLET | Freq: Two times a day (BID) | ORAL | 0 refills | Status: AC

## 2018-09-11 MED ORDER — ACETAMINOPHEN 325 MG PO TABS: 650.0000 mg | ORAL_TABLET | Freq: Four times a day (QID) | ORAL | Status: AC | PRN

## 2018-09-11 MED ORDER — OXYCODONE HCL 5 MG PO TABS: 5.0000 mg | ORAL_TABLET | Freq: Four times a day (QID) | ORAL | 0 refills | Status: AC | PRN

## 2018-09-11 MED ORDER — CIPROFLOXACIN HCL 500 MG PO TABS: 500.0000 mg | ORAL_TABLET | Freq: Two times a day (BID) | ORAL | Status: DC

## 2018-09-11 MED ORDER — SPIRONOLACTONE 25 MG PO TABS: 25.0000 mg | ORAL_TABLET | Freq: Every day | ORAL | Status: DC

## 2018-09-11 MED ORDER — SULFAMETHOXAZOLE-TRIMETHOPRIM 800-160 MG PO TABS: 1.0000 | ORAL_TABLET | Freq: Two times a day (BID) | ORAL | 0 refills | Status: AC

## 2018-09-11 MED ORDER — ONDANSETRON 4 MG PO TBDP: 4.0000 mg | ORAL_TABLET | Freq: Three times a day (TID) | ORAL | 0 refills | Status: DC | PRN

## 2018-09-11 NOTE — Progress Notes (Addendum)
Pt is taken home by her friend. As  of now she has no one  to do her wound care. I asked her to call [facility]mings to see if they can come in the am. She said she may try and do it herself. She was taken to her friends car via a wheelchair. Caitlin Taylor verbalized  Understanding of dc instructions and scripts. The supppies that were in her room were sent home with her.

## 2018-09-11 NOTE — Discharge Summary (Signed)
Physician Discharge Summary   Patient ID: Caitlin Taylor MRN: [id number] DOB/AGE: 02-13-1971 48 y.o.  Admit date: 08/27/2018 Discharge date: 09/11/2018  Primary Care Physician:  [name], MD   Recommendations for Outpatient Follow-up:  1. Follow up with PCP in 1-2 weeks 2. Dressings changes as per instructions  3. Left adrenal mass Incidentally noted on CT, will need outpatient follow-up with MRI and further work-up   Home Health: Home health RN, [facility]  Equipment/Devices:   Discharge Condition: stable  CODE STATUS: FULL  Diet recommendation:    Discharge Diagnoses:    . Cellulitis and abscess of left groin . Acute kidney injury on CKD (chronic kidney disease), stage III ([place]) . Essential hypertension . Left adrenal mass .  History of gout   Consults General surgery Infectious disease    Allergies:  No Known Allergies   DISCHARGE MEDICATIONS: Allergies as of 09/11/2018   No Known Allergies     Medication List    TAKE these medications   acetaminophen 325 MG tablet Commonly known as:  TYLENOL Take 2 tablets (650 mg total) by mouth every 6 (six) hours as needed for moderate pain. What changed:  how much to take   allopurinol 100 MG tablet Commonly known as:  ZYLOPRIM Take 100 mg by mouth daily.   ciprofloxacin 500 MG tablet Commonly known as:  CIPRO Take 1 tablet (500 mg total) by mouth 2 (two) times daily for 10 days.   metoprolol tartrate 25 MG tablet Commonly known as:  LOPRESSOR Take 0.5 tablets (12.5 mg total) by mouth 2 (two) times daily.   MITIGARE 0.6 MG Caps Generic drug:  Colchicine Take 0.6 mg by mouth 2 (two) times daily.   norethindrone 0.35 MG tablet Commonly known as:  MICRONOR,CAMILA,ERRIN Take 1 tablet by mouth at bedtime.   ondansetron 4 MG disintegrating tablet Commonly known as:  ZOFRAN ODT Take 1 tablet (4 mg total) by mouth every 8 (eight) hours as needed for nausea or vomiting.   oxyCODONE 5 MG immediate release  tablet Commonly known as:  Oxy IR/ROXICODONE Take 1 tablet (5 mg total) by mouth every 6 (six) hours as needed for up to 7 days for moderate pain or severe pain.   polyethylene glycol packet Commonly known as:  MIRALAX / GLYCOLAX Take 17 g by mouth daily as needed for mild constipation.   spironolactone 25 MG tablet Commonly known as:  ALDACTONE Take 1 tablet (25 mg total) by mouth daily. Start taking on:  September 12, 2018   sulfamethoxazole-trimethoprim 800-160 MG tablet Commonly known as:  BACTRIM DS,SEPTRA DS Take 1 tablet by mouth 2 (two) times daily for 10 days.   torsemide 20 MG tablet Commonly known as:  DEMADEX Take 20 mg by mouth daily.   Vitamin D (Ergocalciferol) 1.25 MG (50000 UT) Caps capsule Commonly known as:  DRISDOL Take 50,000 Units by mouth every Sunday.            Durable Medical Equipment  (From admission, onward)         Start     Ordered   09/06/18 0954  For home use only DME 3 n 1  Once     02 /26/20 0954           Discharge Care Instructions  (From admission, onward)         Start     Ordered   09/11/18 0000  Discharge wound care:    Comments:  Wound care:  You may shower with  gauze removed and then repack wound after. Remove Kerlix, and repack each site wet to dry with wet Kerlix.  Use sterile saline for wetting agent.  Pack so Kerlix is just inside the wound.  Cover with ABD's, and use Minimal paper tape to cover sites. Dressing changes TWICE A DAY.   09/11/18 1016           Brief H and P: For complete details please refer to admission H and P, but in brief Caitlin Taylor a 48 y.o.femalewithwith medical history significant ofDVT and PE, hypertension, chronic kidney disease, obesity presenting with left groin pain and cellulitis.General surgery consulted and patient underwent incision and drainage x2. Patient was placed on IV Zosyn and has been transitioned to oral Bactrim and Augmentin.   Hospital Course:   Left groin and  thigh abscess and cellulitis  -Status post I&D bedside of the left groin abscess on 2/17, cultures grew out E. Coli. -IV antibiotics were changed to IV Zosyn per general surgery -Underwent I&D of left thigh abscess and left groin abscess again on 2/20 -Initial cultures from bedside I&D showed E. coli and Streptococcus G, cultures from I&D on 2/20 showed E. coli and Streptococcus group F. -ID was consulted, patient was seen by Dr. Bridget Hartshorn, IV Zosyn was transitioned to oral Bactrim and oral Augmentin to complete course of 10 to 14 days. -Underwent I&D again on 09/08/2018 for the left medial groin wound, leukocytosis improving -Dressing changes per instructions, home health aide, RN was arranged Continue oral ciprofloxacin and Bactrim for 10 days  Acute kidney injury on CKD stage III -IV vancomycin has been discontinued, creatinine plateaued at 2.4 -Creatinine plateaued at 1.6, likely trended up due to Bactrim. -Creatinine now trending down, 1.5  Essential hypertension -Continue metoprolol, torsemide  Left adrenal mass Incidentally noted on CT, will need outpatient follow-up with MRI and further work-up  History of gout Continue colchicine, allopurinol   Day of Discharge S: Feels a lot better, no fevers, left thigh and groin wound is healing.   BP (!) 145/89 (BP Location: Left Arm)   Pulse 75   Temp 97.6 F (36.4 C) (Oral)   Resp 16   Ht 5\' 3"  (1.6 m)   Wt 135.9 kg   SpO2 95%   BMI 53.09 kg/m   Physical Exam: General: Alert and awake oriented x3 not in any acute distress. HEENT: anicteric sclera, pupils reactive to light and accommodation CVS: S1-S2 clear no murmur rubs or gallops Chest: clear to auscultation bilaterally, no wheezing rales or rhonchi Abdomen: soft nontender, nondistended, normal bowel sounds Extremities: no cyanosis, clubbing or edema noted bilaterally Neuro: Cranial nerves II-XII intact, no focal neurological deficits   The results of significant  diagnostics from this hospitalization (including imaging, microbiology, ancillary and laboratory) are listed below for reference.      Procedures/Studies:  Dg Chest 2 View  Result Date: 09/01/2018 CLINICAL DATA:  Shortness of breath.  Leukocytosis. EXAM: CHEST - 2 VIEW COMPARISON:  Chest CT 09/03/2016 FINDINGS: Cardiomediastinal silhouette is normal. Mediastinal contours appear intact. There is no evidence of focal airspace consolidation, pleural effusion or pneumothorax. Osseous structures are without acute abnormality. Soft tissues are grossly normal. IMPRESSION: No active cardiopulmonary disease. Electronically Signed   By: Fidela Salisbury M.D.   On: 09/01/2018 11:12   Ct Abdomen Pelvis W Contrast  Addendum Date: 08/27/2018   ADDENDUM REPORT: 08/27/2018 04:32 ADDENDUM: Add to IMPRESSION: Thickened urinary bladder wall, a finding felt to be indicative of a degree of  cystitis. Electronically Signed   By: Lowella Grip III M.D.   On: 08/27/2018 04:32   Result Date: 08/27/2018 CLINICAL DATA:  Abdominal pain, primarily left-sided EXAM: CT ABDOMEN AND PELVIS WITH CONTRAST TECHNIQUE: Multidetector CT imaging of the abdomen and pelvis was performed using the standard protocol following bolus administration of intravenous contrast. CONTRAST:  60mL ISOVUE-300 IOPAMIDOL (ISOVUE-300) INJECTION 61% COMPARISON:  None. FINDINGS: Lower chest: There is bibasilar atelectatic change. Hepatobiliary: No focal liver lesions are evident. Gallbladder appears contracted. No biliary duct dilatation evident. Pancreas: No pancreatic mass or inflammatory focus. Spleen: No splenic lesions are evident. Adrenals/Urinary Tract: Right adrenal appears normal. There is a mass arising from the left adrenal measuring 2.3 x 2.2 cm. The attenuation of this mass does not indicate that it represents an adenoma. There is a 4 mm probable cyst in the upper pole left kidney. There is no evident hydronephrosis on either side. There is no  evident renal or ureteral calculus on either side. The urinary bladder wall is thickened. Stomach/Bowel: The rectum is borderline distended with stool. There is no bowel wall or mesenteric thickening. No diverticulitis evident. There is no bowel obstruction. There is no evident free air or portal venous air. There is lipomatous infiltration of the ileocecal valve. Vascular/Lymphatic: There is no abdominal aortic aneurysm. No vascular lesions are appreciable. There is no adenopathy in the abdomen or pelvis. Reproductive: The uterus is anteverted.  No pelvic mass is evident. Other: Appendix appears unremarkable. No abscess or ascites is evident in the peritoneum or retroperitoneum of the abdomen and pelvis. There is a small ventral hernia containing only fat. There is soft tissue stranding in the left inguinal region. There is ill-defined fluid in the subcutaneous region of the left inguinal region this ill-defined collection measures 3.8 x 3.6 x 3.3 cm. Musculoskeletal: There is postoperative change in the lumbar spine at L4 and L5. There is degenerative change in the lower lumbar spine. There are no blastic or lytic bone lesions. No intramuscular lesions are evident. IMPRESSION: 1. There is inflammatory change with apparent developing phlegmon in the medial upper left thigh/left inguinal region. No air is seen in this area. 2. No evident diverticulitis. No bowel obstruction or bowel wall thickening. No abscess within the peritoneum or retroperitoneum of the abdomen and pelvis. Appendix appears normal. 3. There is a left adrenal mass measuring 2.3 x 2.2 cm. Etiology uncertain. Nonemergent adrenal MR to further evaluate this mass pre and post-contrast may well be advisable. 4.  No evident renal or ureteral calculus.  No hydronephrosis. 5.  Postoperative change at L4 and L5. Electronically Signed: By: Lowella Grip III M.D. On: 08/27/2018 04:25       LAB RESULTS: Basic Metabolic Panel: Recent Labs  Lab  09/09/18 0419 09/10/18 0515  NA 142 140  K 4.6 4.7  CL 111 112*  CO2 24 23  GLUCOSE 118* 103*  BUN 17 14  CREATININE 1.66* 1.51*  CALCIUM 8.6* 8.4*   Liver Function Tests: No results for input(s): AST, ALT, ALKPHOS, BILITOT, PROT, ALBUMIN in the last 168 hours. No results for input(s): LIPASE, AMYLASE in the last 168 hours. No results for input(s): AMMONIA in the last 168 hours. CBC: Recent Labs  Lab 09/06/18 0359  09/09/18 0419 09/10/18 0515  WBC 13.5*   < > 22.4* 13.4*  NEUTROABS 11.6*  --   --   --   HGB 10.0*   < > 10.7* 9.9*  HCT 32.8*   < > 34.7* 32.0*  MCV 98.5   < > 98.3 99.7  PLT 257   < > 507* 386   < > = values in this interval not displayed.   Cardiac Enzymes: No results for input(s): CKTOTAL, CKMB, CKMBINDEX, TROPONINI in the last 168 hours. BNP: Invalid input(s): POCBNP CBG: No results for input(s): GLUCAP in the last 168 hours.    Disposition and Follow-up: Discharge Instructions    Diet - low sodium heart healthy   Complete by:  As directed    Discharge wound care:   Complete by:  As directed    Wound care:  You may shower with gauze removed and then repack wound after. Remove Kerlix, and repack each site wet to dry with wet Kerlix.  Use sterile saline for wetting agent.  Pack so Kerlix is just inside the wound.  Cover with ABD's, and use Minimal paper tape to cover sites. Dressing changes TWICE A DAY.   Increase activity slowly   Complete by:  As directed        DISPOSITION: Home with home health aide, RN   [facility] [place]. Go on 09/20/2019.   Specialty:  General Surgery Why:  Your appointment is 09/20/18 at 8:30AM.  Be at the office 30 minutes early for check in.  Bring photo ID and insurance information with you.  Contact information: [place] [place] [address] [phone]        [name], MD Follow up.   Specialty:  Internal Medicine Why:  Follow up  for medical issues Contact information: [id number] [place] [address] [phone]            Time coordinating discharge:  35 mins   Signed:   Estill Cotta M.D. [facility] 09/11/2018, 1:53 PM

## 2018-09-11 NOTE — Progress Notes (Signed)
[facility] Progress Note  3 Days Post-Op  Subjective: CC-  Did well with dressing changes over the weekend. Going to try without morphine today. States that her family has not come by yet to learn how to do dressing changes.  Objective: Vital signs in last 24 hours: Temp:  [97.6 F (36.4 C)-98.4 F (36.9 C)] 97.6 F (36.4 C) (03/02 0437) Pulse Rate:  [69-93] 75 (03/02 0437) Resp:  [16-18] 16 (03/02 0437) BP: (132-154)/(85-89) 145/89 (03/02 0437) SpO2:  [95 %-100 %] 95 % (03/02 0437) Weight:  [135.9 kg] 135.9 kg (03/02 0437) Last BM Date: 09/10/18  Intake/Output from previous day: 03/01 0701 - 03/02 0700 In: 480 [P.O.:480] Out: -  Intake/Output this shift: No intake/output data recorded.  PE: Gen:  Alert, NAD, pleasant HEENT: EOM's intact, pupils equal and round Pulm:  effort normal Abd: Soft, NT Skin/left groin: 3 wounds seen below, no purulent drainage noted          Lab Results:  Recent Labs    09/09/18 0419 09/10/18 0515  WBC 22.4* 13.4*  HGB 10.7* 9.9*  HCT 34.7* 32.0*  PLT 507* 386   BMET Recent Labs    09/09/18 0419 09/10/18 0515  NA 142 140  K 4.6 4.7  CL 111 112*  CO2 24 23  GLUCOSE 118* 103*  BUN 17 14  CREATININE 1.66* 1.51*  CALCIUM 8.6* 8.4*   PT/INR No results for input(s): LABPROT, INR in the last 72 hours. CMP     Component Value Date/Time   NA 140 09/10/2018 0515   K 4.7 09/10/2018 0515   CL 112 (H) 09/10/2018 0515   CO2 23 09/10/2018 0515   GLUCOSE 103 (H) 09/10/2018 0515   BUN 14 09/10/2018 0515   CREATININE 1.51 (H) 09/10/2018 0515   CALCIUM 8.4 (L) 09/10/2018 0515   PROT 5.9 (L) 08/28/2018 0357   ALBUMIN 2.9 (L) 08/28/2018 0357   AST 20 08/28/2018 0357   ALT 38 08/28/2018 0357   ALKPHOS 81 08/28/2018 0357   BILITOT 0.9 08/28/2018 0357   GFRNONAA 41 (L) 09/10/2018 0515   GFRAA 47 (L) 09/10/2018 0515   Lipase  No results found for: LIPASE     Studies/Results: No results  found.  Anti-infectives: Anti-infectives (From admission, onward)   Start     Dose/Rate Route Frequency Ordered Stop   09/11/18 1700  ciprofloxacin (CIPRO) tablet 500 mg     500 mg Oral 2 times daily 09/11/18 1007     09/11/18 0000  ciprofloxacin (CIPRO) 500 MG tablet     500 mg Oral 2 times daily 09/11/18 1011 09/21/18 2359   09/11/18 0000  sulfamethoxazole-trimethoprim (BACTRIM DS,SEPTRA DS) 800-160 MG tablet     1 tablet Oral 2 times daily 09/11/18 1011 09/21/18 2359   09/10/18 2230  ciprofloxacin (CIPRO) IVPB 400 mg  Status:  Discontinued     400 mg 200 mL/hr over 60 Minutes Intravenous 2 times daily 09/10/18 2217 09/11/18 1007   09/10/18 0400  ciprofloxacin (CIPRO) IVPB 400 mg  Status:  Discontinued     400 mg 200 mL/hr over 60 Minutes Intravenous Every 12 hours 09/09/18 1724 09/10/18 2216   09/09/18 1400  ciprofloxacin (CIPRO) IVPB 400 mg  Status:  Discontinued     400 mg 200 mL/hr over 60 Minutes Intravenous Every 12 hours 09/09/18 1238 09/09/18 1724   09/08/18 0600  ceFAZolin (ANCEF) 3 g in dextrose 5 % 50 mL IVPB     3 g 100 mL/hr over  30 Minutes Intravenous On call to O.R. 09/07/18 1319 09/08/18 1014   09/05/18 1030  sulfamethoxazole-trimethoprim (BACTRIM DS,SEPTRA DS) 800-160 MG per tablet 1 tablet     1 tablet Oral Every 12 hours 09/05/18 1001     09/05/18 1030  amoxicillin-clavulanate (AUGMENTIN) 875-125 MG per tablet 1 tablet  Status:  Discontinued     1 tablet Oral Every 12 hours 09/05/18 1001 09/09/18 1238   08/30/18 2000  doxycycline (VIBRAMYCIN) 100 mg in sodium chloride 0.9 % 250 mL IVPB  Status:  Discontinued     100 mg 125 mL/hr over 120 Minutes Intravenous Every 12 hours 08/30/18 1944 09/01/18 1600   08/30/18 1000  piperacillin-tazobactam (ZOSYN) IVPB 3.375 g  Status:  Discontinued     3.375 g 12.5 mL/hr over 240 Minutes Intravenous Every 8 hours 08/30/18 0928 09/05/18 1001   08/28/18 1100  vancomycin (VANCOCIN) IVPB 750 mg/150 ml premix  Status:  Discontinued      750 mg 150 mL/hr over 60 Minutes Intravenous Every 24 hours 08/27/18 1225 08/28/18 1039   08/28/18 1045  vancomycin variable dose per unstable renal function (pharmacist dosing)  Status:  Discontinued      Does not apply See admin instructions 08/28/18 1045 08/28/18 1331   08/27/18 1800  cefTRIAXone (ROCEPHIN) 2 g in sodium chloride 0.9 % 100 mL IVPB  Status:  Discontinued     2 g 200 mL/hr over 30 Minutes Intravenous Every 24 hours 08/27/18 0759 08/30/18 0923   08/27/18 0900  vancomycin (VANCOCIN) 2,000 mg in sodium chloride 0.9 % 500 mL IVPB     2,000 mg 250 mL/hr over 120 Minutes Intravenous  Once 08/27/18 0811 08/27/18 1111   08/27/18 0515  cefTRIAXone (ROCEPHIN) 1 g in sodium chloride 0.9 % 100 mL IVPB     1 g 200 mL/hr over 30 Minutes Intravenous  Once 08/27/18 0502 08/27/18 0603   08/27/18 0500  vancomycin (VANCOCIN) IVPB 1000 mg/200 mL premix  Status:  Discontinued     1,000 mg 200 mL/hr over 60 Minutes Intravenous  Once 08/27/18 0446 08/27/18 0810       Assessment/Plan AKI on CKD-III - Cr 1.51 (3/1) HTN Obesity   Left groin / thigh abscess S/p I&D Dr. Ninfa Linden 2/20 S/p I&D Dr. Marcello Moores 2/28 - POD11 & 3 - culture:ESCHERICHIA COLI -BID wet to dry dressing changes  ID -currently bactrim 2/25>>, cipro 2/29>> FEN -reg diet VTE -SCDs, lovenox Foley -none  Plan-Wounds stable, would not recommend any more surgical intervention. Continue BID dressing changes and oral antibiotics. Home health has been arranged.  Would prefer patient's family to come to hospital to learn how to do dressing changes, then she will be stable for discharge from surgical standpoint. Discharge instructions and f/u info on AVS.   LOS: 15 days    Wellington Hampshire , [facility] 09/11/2018, 11:19 AM Pager: [phone] Mon-Thurs 7:00 am-4:30 pm Fri 7:00 am -11:30 AM Sat-Sun 7:00 am-11:30 am

## 2019-05-09 ENCOUNTER — Emergency Department (HOSPITAL_BASED_OUTPATIENT_CLINIC_OR_DEPARTMENT_OTHER)
Admission: EM | Admit: 2019-05-09 | Discharge: 2019-05-09 | Disposition: A | Discharge status: Home / Self Care | Payer: 59 | Attending: Emergency Medicine | Admitting: Emergency Medicine

## 2019-05-09 ENCOUNTER — Other Ambulatory Visit: Payer: Self-pay

## 2019-05-09 ENCOUNTER — Encounter (HOSPITAL_BASED_OUTPATIENT_CLINIC_OR_DEPARTMENT_OTHER): Payer: Self-pay

## 2019-05-09 ENCOUNTER — Emergency Department (HOSPITAL_BASED_OUTPATIENT_CLINIC_OR_DEPARTMENT_OTHER): Payer: 59

## 2019-05-09 DIAGNOSIS — Y999 Unspecified external cause status: Secondary | ICD-10-CM | POA: Insufficient documentation

## 2019-05-09 DIAGNOSIS — Z79899 Other long term (current) drug therapy: Secondary | ICD-10-CM | POA: Insufficient documentation

## 2019-05-09 DIAGNOSIS — Y93E1 Activity, personal bathing and showering: Secondary | ICD-10-CM | POA: Insufficient documentation

## 2019-05-09 DIAGNOSIS — W182XXA Fall in (into) shower or empty bathtub, initial encounter: Secondary | ICD-10-CM | POA: Diagnosis not present

## 2019-05-09 DIAGNOSIS — I129 Hypertensive chronic kidney disease with stage 1 through stage 4 chronic kidney disease, or unspecified chronic kidney disease: Secondary | ICD-10-CM | POA: Diagnosis not present

## 2019-05-09 DIAGNOSIS — N183 Chronic kidney disease, stage 3 unspecified: Secondary | ICD-10-CM | POA: Insufficient documentation

## 2019-05-09 DIAGNOSIS — Y92002 Bathroom of unspecified non-institutional (private) residence single-family (private) house as the place of occurrence of the external cause: Secondary | ICD-10-CM | POA: Insufficient documentation

## 2019-05-09 DIAGNOSIS — S6992XA Unspecified injury of left wrist, hand and finger(s), initial encounter: Secondary | ICD-10-CM | POA: Diagnosis present

## 2019-05-09 DIAGNOSIS — S60229A Contusion of unspecified hand, initial encounter: Secondary | ICD-10-CM

## 2019-05-09 DIAGNOSIS — S60222A Contusion of left hand, initial encounter: Secondary | ICD-10-CM | POA: Diagnosis not present

## 2019-05-09 NOTE — ED Triage Notes (Signed)
Pt states she fell and injured left hand at 2am-NAD-to triage in w/c

## 2019-05-09 NOTE — Discharge Instructions (Addendum)
Take ibuprofen 600 mg every 6 hours as needed for pain.  Ice your hand for 20 minutes every 2 hours while awake for the next 2 days.  Follow-up with your hand surgeon if symptoms are not improving in the next few days.

## 2019-05-09 NOTE — ED Provider Notes (Signed)
[facility] Provider Note   CSN: [id number] Arrival date & time: 05/09/19  1857     History   Chief Complaint Chief Complaint  Patient presents with  . Hand Injury    HPI TENNILLE Taylor is a 48 y.o. female.     Patient is a 48 year old female with history of hypertension, pulmonary embolism, chronic renal insufficiency.  She presents today for evaluation of hand pain.  Patient was in the shower when she slipped and fell and caught herself with the palm of her left hand.  Patient underwent a carpal tunnel surgery on the same hand in August by Dr. Jeannie Fend.  The history is provided by the patient.  Hand Injury Location:  Hand Hand location:  L hand Injury: yes   Time since incident:  3 hours Mechanism of injury: fall     Past Medical History:  Diagnosis Date  . Chronic back pain   . CKD (chronic kidney disease) stage 2, GFR 60-89 ml/min   . DVT (deep venous thrombosis) ([place])   . Hypertension   . Lumbar stenosis   . Morbid obesity ([place])   . Pulmonary embolism ([place])    a. diagnosed in 08/2016. Started on Eliquis    Patient Active Problem List   Diagnosis Date Noted  . Acute renal failure superimposed on stage 3 chronic kidney disease ([place])   . Abscess 08/28/2018  . Cellulitis of left groin 08/27/2018  . Right leg DVT ([place]) 09/30/2016  . Chronic diastolic CHF (congestive heart failure) ([place]) 09/22/2016  . Edema 09/04/2016  . History of pulmonary embolus (PE) 09/03/2016  . Benign essential HTN   . CKD (chronic kidney disease), stage III ([place])   . Lumbar stenosis   . S/P lumbar fusion   . Normocytic anemia   . Constipation due to pain medication   . Morbid obesity due to excess calories ([place])   . Essential hypertension 01/05/2016    Past Surgical History:  Procedure Laterality Date  . ABDOMINAL SURGERY    . CARPAL TUNNEL RELEASE    . CESAREAN SECTION    . INCISION AND DRAINAGE ABSCESS Left 09/08/2018   Procedure: INCISION  AND DRAINAGE LEFT GROIN ABSCESS;  Surgeon: Leighton Ruff, MD;  Location: [facility];  Service: General;  Laterality: Left;  . IRRIGATION AND DEBRIDEMENT ABSCESS N/A 09/27/2016   Procedure: IRRIGATION AND DEBRIDEMENT OF ABDOMINAL WALL ABSCESS;  Surgeon: Greer Pickerel, MD;  Location: [facility];  Service: General;  Laterality: N/A;  . IRRIGATION AND DEBRIDEMENT ABSCESS Left 08/31/2018   Procedure: IRRIGATION AND DEBRIDEMENT ABSCESS THIGH/GROIN LEFT;  Surgeon: Coralie Keens, MD;  Location: [facility];  Service: General;  Laterality: Left;  Marland Kitchen MAXIMUM ACCESS (MAS)POSTERIOR LUMBAR INTERBODY FUSION (PLIF) 1 LEVEL N/A 01/06/2016   Procedure: FOR MAXIMUM ACCESS (MAS) POSTERIOR LUMBAR INTERBODY FUSION (PLIF) LUMBAR FOUR-FIVE;  Surgeon: Kevan Ny Ditty, MD;  Location: [facility];  Service: Neurosurgery;  Laterality: N/A;     OB History   No obstetric history on file.      Home Medications    Prior to Admission medications   Medication Sig Start Date End Date Taking? Authorizing Provider  acetaminophen (TYLENOL) 325 MG tablet Take 2 tablets (650 mg total) by mouth every 6 (six) hours as needed for moderate pain. 09/11/18   Rai, Vernelle Emerald, MD  allopurinol (ZYLOPRIM) 100 MG tablet Take 100 mg by mouth daily. 08/19/18   [provider]  metoprolol tartrate (LOPRESSOR) 25 MG tablet Take 0.5 tablets (  12.5 mg total) by mouth 2 (two) times daily. 09/06/16   Rosita Fire, MD  MITIGARE 0.6 MG CAPS Take 0.6 mg by mouth 2 (two) times daily. 08/25/18   [provider]  norethindrone (MICRONOR,CAMILA,ERRIN) 0.35 MG tablet Take 1 tablet by mouth at bedtime. 08/10/18   [provider]  ondansetron (ZOFRAN ODT) 4 MG disintegrating tablet Take 1 tablet (4 mg total) by mouth every 8 (eight) hours as needed for nausea or vomiting. 09/11/18   Rai, Vernelle Emerald, MD  polyethylene glycol (MIRALAX / GLYCOLAX) packet Take 17 g by mouth daily as needed for mild constipation. 09/30/16   Debbe Odea, MD   spironolactone (ALDACTONE) 25 MG tablet Take 1 tablet (25 mg total) by mouth daily. 09/12/18   Rai, Ripudeep K, MD  torsemide (DEMADEX) 20 MG tablet Take 20 mg by mouth daily.    [provider]  Vitamin D, Ergocalciferol, (DRISDOL) 50000 units CAPS capsule Take 50,000 Units by mouth every Sunday.  08/09/16   [provider]    Family History Family History  Problem Relation Age of Onset  . Congestive Heart Failure Father     Social History Social History   Tobacco Use  . Smoking status: Never Smoker  . Smokeless tobacco: Never Used  Substance Use Topics  . Alcohol use: No  . Drug use: No     Allergies   Patient has no known allergies.   Review of Systems Review of Systems  All other systems reviewed and are negative.    Physical Exam Updated Vital Signs BP (!) 142/87 (BP Location: Left Arm)   Pulse 92   Temp 98.7 F (37.1 C) (Oral)   Resp 16   Ht 5\' 3"  (1.6 m)   Wt 125.2 kg   LMP 05/09/2019   SpO2 96%   BMI 48.89 kg/m   Physical Exam Vitals signs and nursing note reviewed.  Constitutional:      General: She is not in acute distress.    Appearance: Normal appearance. She is not ill-appearing.  HENT:     Head: Normocephalic and atraumatic.  Pulmonary:     Effort: Pulmonary effort is normal.  Musculoskeletal:     Comments: The left hand has carpal tunnel repair scarring noted.  This appears to be healing well.  There is tenderness to palpation in this area, however no obvious deformity or significant swelling.  She is able to flex and extend her fingers, however with some discomfort.  Sensation is intact to all fingertips and capillary refill is brisk throughout.  Skin:    General: Skin is warm and dry.  Neurological:     Mental Status: She is alert.      ED Treatments / Results  Labs (all labs ordered are listed, but only abnormal results are displayed) Labs Reviewed - No data to display  EKG None  Radiology No results found.   Procedures Procedures (including critical care time)  Medications Ordered in ED Medications - No data to display   Initial Impression / Assessment and Plan / ED Course  I have reviewed the triage vital signs and the nursing notes.  Pertinent labs & imaging results that were available during my care of the patient were reviewed by me and considered in my medical decision making (see chart for details).  Patient presenting with complaints of pain in her recently surgically repaired hand.  Patient had a carpal tunnel release performed by Dr. Jeannie Fend in August.  Today she slipped and  fell in the shower and landed on her hand.  She is having discomfort in the area of the surgery.  She is able to flex and extend all fingers and capillary refill is brisk.  She has sensation to all fingertips.  X-rays are negative.  Nothing today appears acute.  She is to ice, rest, and follow-up with her hand surgeon if not improving in the next few days.  Final Clinical Impressions(s) / ED Diagnoses   Final diagnoses:  None    ED Discharge Orders    None       Veryl Speak, MD 05/09/19 2017

## 2019-06-25 ENCOUNTER — Encounter (HOSPITAL_BASED_OUTPATIENT_CLINIC_OR_DEPARTMENT_OTHER): Payer: Self-pay | Admitting: *Deleted

## 2019-06-25 ENCOUNTER — Emergency Department (HOSPITAL_BASED_OUTPATIENT_CLINIC_OR_DEPARTMENT_OTHER)
Admission: EM | Admit: 2019-06-25 | Discharge: 2019-06-25 | Disposition: A | Discharge status: Home / Self Care | Payer: 59 | Attending: Emergency Medicine | Admitting: Emergency Medicine

## 2019-06-25 ENCOUNTER — Emergency Department (HOSPITAL_BASED_OUTPATIENT_CLINIC_OR_DEPARTMENT_OTHER): Payer: 59

## 2019-06-25 ENCOUNTER — Other Ambulatory Visit: Payer: Self-pay

## 2019-06-25 ENCOUNTER — Emergency Department (HOSPITAL_BASED_OUTPATIENT_CLINIC_OR_DEPARTMENT_OTHER)
Admit: 2019-06-25 | Discharge: 2019-06-25 | Disposition: A | Discharge status: Home / Self Care | Payer: 59 | Attending: Emergency Medicine | Admitting: Emergency Medicine

## 2019-06-25 DIAGNOSIS — I130 Hypertensive heart and chronic kidney disease with heart failure and stage 1 through stage 4 chronic kidney disease, or unspecified chronic kidney disease: Secondary | ICD-10-CM | POA: Diagnosis not present

## 2019-06-25 DIAGNOSIS — N183 Chronic kidney disease, stage 3 unspecified: Secondary | ICD-10-CM | POA: Diagnosis not present

## 2019-06-25 DIAGNOSIS — Z79899 Other long term (current) drug therapy: Secondary | ICD-10-CM | POA: Insufficient documentation

## 2019-06-25 DIAGNOSIS — I5032 Chronic diastolic (congestive) heart failure: Secondary | ICD-10-CM | POA: Insufficient documentation

## 2019-06-25 DIAGNOSIS — L03115 Cellulitis of right lower limb: Secondary | ICD-10-CM | POA: Diagnosis not present

## 2019-06-25 DIAGNOSIS — M79604 Pain in right leg: Secondary | ICD-10-CM | POA: Diagnosis present

## 2019-06-25 LAB — CBC WITH DIFFERENTIAL/PLATELET
Abs Immature Granulocytes: 0.25 10*3/uL — ABNORMAL HIGH (ref 0.00–0.07)
Basophils Absolute: 0.1 10*3/uL (ref 0.0–0.1)
Basophils Relative: 1 %
Eosinophils Absolute: 0.1 10*3/uL (ref 0.0–0.5)
Eosinophils Relative: 0 %
HCT: 37.2 % (ref 36.0–46.0)
Hemoglobin: 12.1 g/dL (ref 12.0–15.0)
Immature Granulocytes: 2 %
Lymphocytes Relative: 8 %
Lymphs Abs: 0.9 10*3/uL (ref 0.7–4.0)
MCH: 30.8 pg (ref 26.0–34.0)
MCHC: 32.5 g/dL (ref 30.0–36.0)
MCV: 94.7 fL (ref 80.0–100.0)
Monocytes Absolute: 1.1 10*3/uL — ABNORMAL HIGH (ref 0.1–1.0)
Monocytes Relative: 9 %
Neutro Abs: 9.7 10*3/uL — ABNORMAL HIGH (ref 1.7–7.7)
Neutrophils Relative %: 80 %
Platelets: 182 10*3/uL (ref 150–400)
RBC: 3.93 MIL/uL (ref 3.87–5.11)
RDW: 13 % (ref 11.5–15.5)
WBC: 12.1 10*3/uL — ABNORMAL HIGH (ref 4.0–10.5)
nRBC: 0 % (ref 0.0–0.2)

## 2019-06-25 LAB — BASIC METABOLIC PANEL
Anion gap: 10 (ref 5–15)
BUN: 21 mg/dL — ABNORMAL HIGH (ref 6–20)
CO2: 26 mmol/L (ref 22–32)
Calcium: 8.9 mg/dL (ref 8.9–10.3)
Chloride: 104 mmol/L (ref 98–111)
Creatinine, Ser: 1.74 mg/dL — ABNORMAL HIGH (ref 0.44–1.00)
GFR calc Af Amer: 39 mL/min — ABNORMAL LOW (ref >60)
GFR calc non Af Amer: 34 mL/min — ABNORMAL LOW (ref >60)
Glucose, Bld: 123 mg/dL — ABNORMAL HIGH (ref 70–99)
Potassium: 3.5 mmol/L (ref 3.5–5.1)
Sodium: 140 mmol/L (ref 135–145)

## 2019-06-25 LAB — D-DIMER, QUANTITATIVE: D-Dimer, Quant: 2.18 ug/mL-FEU — ABNORMAL HIGH (ref 0.00–0.50)

## 2019-06-25 MED ORDER — ENOXAPARIN SODIUM 120 MG/0.8ML ~~LOC~~ SOLN
1.0000 mg/kg | Freq: Once | SUBCUTANEOUS | Status: AC
  Administered 2019-06-25: 02:00:00 120 mg via SUBCUTANEOUS
  Filled 2019-06-25: qty 0.8

## 2019-06-25 MED ORDER — SODIUM CHLORIDE 0.9 % IV SOLN
Freq: Once | INTRAVENOUS | Status: AC
  Administered 2019-06-25: 01:00:00 via INTRAVENOUS

## 2019-06-25 MED ORDER — IOHEXOL 300 MG/ML  SOLN: 80.0000 mL | Freq: Once | INTRAMUSCULAR | Status: DC | PRN

## 2019-06-25 MED ORDER — DOXYCYCLINE HYCLATE 100 MG PO CAPS: 100.0000 mg | ORAL_CAPSULE | Freq: Two times a day (BID) | ORAL | 0 refills | Status: DC

## 2019-06-25 MED ORDER — VANCOMYCIN HCL IN DEXTROSE 1-5 GM/200ML-% IV SOLN
1000.0000 mg | Freq: Once | INTRAVENOUS | Status: AC
  Administered 2019-06-25: 01:00:00 1000 mg via INTRAVENOUS
  Filled 2019-06-25: qty 200

## 2019-06-25 MED ORDER — FENTANYL CITRATE (PF) 100 MCG/2ML IJ SOLN
100.0000 ug | Freq: Once | INTRAMUSCULAR | Status: AC
  Administered 2019-06-25: 02:00:00 100 ug via INTRAVENOUS
  Filled 2019-06-25: qty 2

## 2019-06-25 MED ORDER — IOHEXOL 350 MG/ML SOLN
80.0000 mL | Freq: Once | INTRAVENOUS | Status: AC | PRN
  Administered 2019-06-25: 03:00:00 80 mL via INTRAVENOUS

## 2019-06-25 NOTE — Discharge Instructions (Signed)
You were seen today for swelling and redness of the right lower extremity.  You have evidence of infection of the skin.  Your CT does not show any blood clot.  You do need to return later today for ultrasound to rule out blood clot in your leg.  Take antibiotics as prescribed.  Keep the leg elevated.  If you develop worsening pain, fevers, any new or worsening symptoms you should be reevaluated.

## 2019-06-25 NOTE — ED Provider Notes (Signed)
[facility] Provider Note   CSN: [id number] Arrival date & time: 06/25/19  0031     History Chief Complaint  Patient presents with  . Leg Pain    Caitlin Taylor is a 48 y.o. female.  HPI     This is a 48 year old female with a history of chronic kidney disease, DVT, obesity who presents with right leg pain and swelling.  Patient reports 2-week history of worsening right leg pain and swelling.  She states that it is worse when she stands.  She has noted some redness.  No fevers.  She has a history of DVT but not currently on any anticoagulants.  Patient rates her pain at 9 out of 10.  She has not taken anything for her pain.  Has not noted any upper respiratory symptoms, shortness of breath, chest pain, abdominal pain, nausea, vomiting.  Past Medical History:  Diagnosis Date  . Chronic back pain   . CKD (chronic kidney disease) stage 2, GFR 60-89 ml/min   . DVT (deep venous thrombosis) ([place])   . Hypertension   . Lumbar stenosis   . Morbid obesity ([place])   . Pulmonary embolism ([place])    a. diagnosed in 08/2016. Started on Eliquis    Patient Active Problem List   Diagnosis Date Noted  . Acute renal failure superimposed on stage 3 chronic kidney disease ([place])   . Abscess 08/28/2018  . Cellulitis of left groin 08/27/2018  . Right leg DVT ([place]) 09/30/2016  . Chronic diastolic CHF (congestive heart failure) ([place]) 09/22/2016  . Edema 09/04/2016  . History of pulmonary embolus (PE) 09/03/2016  . Benign essential HTN   . CKD (chronic kidney disease), stage III ([place])   . Lumbar stenosis   . S/P lumbar fusion   . Normocytic anemia   . Constipation due to pain medication   . Morbid obesity due to excess calories ([place])   . Essential hypertension 01/05/2016    Past Surgical History:  Procedure Laterality Date  . ABDOMINAL SURGERY    . CARPAL TUNNEL RELEASE    . CESAREAN SECTION    . INCISION AND DRAINAGE ABSCESS Left 09/08/2018   Procedure:  INCISION AND DRAINAGE LEFT GROIN ABSCESS;  Surgeon: Leighton Ruff, MD;  [facility];  Service: General;  Laterality: Left;  . IRRIGATION AND DEBRIDEMENT ABSCESS N/A 09/27/2016   Procedure: IRRIGATION AND DEBRIDEMENT OF ABDOMINAL WALL ABSCESS;  Surgeon: Greer Pickerel, MD;  [facility];  Service: General;  Laterality: N/A;  . IRRIGATION AND DEBRIDEMENT ABSCESS Left 08/31/2018   Procedure: IRRIGATION AND DEBRIDEMENT ABSCESS THIGH/GROIN LEFT;  Surgeon: Coralie Keens, MD;  [facility];  Service: General;  Laterality: Left;  Marland Kitchen MAXIMUM ACCESS (MAS)POSTERIOR LUMBAR INTERBODY FUSION (PLIF) 1 LEVEL N/A 01/06/2016   Procedure: FOR MAXIMUM ACCESS (MAS) POSTERIOR LUMBAR INTERBODY FUSION (PLIF) LUMBAR FOUR-FIVE;  Surgeon: Kevan Ny Ditty, MD;  Location: [facility];  Service: Neurosurgery;  Laterality: N/A;     OB History   No obstetric history on file.     Family History  Problem Relation Age of Onset  . Congestive Heart Failure Father     Social History   Tobacco Use  . Smoking status: Never Smoker  . Smokeless tobacco: Never Used  Substance Use Topics  . Alcohol use: Not Currently    Comment: rare  . Drug use: No    Home Medications Prior to Admission medications   Medication Sig Start Date End Date Taking? Authorizing Provider  metoprolol tartrate (LOPRESSOR) 25 MG tablet Take 0.5 tablets (12.5 mg total) by mouth 2 (two) times daily. 09/06/16  Yes Rosita Fire, MD  MITIGARE 0.6 MG CAPS Take 0.6 mg by mouth 2 (two) times daily. 08/25/18  Yes [provider]  spironolactone (ALDACTONE) 25 MG tablet Take 1 tablet (25 mg total) by mouth daily. 09/12/18  Yes Rai, Ripudeep K, MD  torsemide (DEMADEX) 20 MG tablet Take 20 mg by mouth daily.   Yes [provider]  acetaminophen (TYLENOL) 325 MG tablet Take 2 tablets (650 mg total) by mouth every 6 (six) hours as needed for moderate pain. 09/11/18   Rai, Vernelle Emerald, MD  allopurinol (ZYLOPRIM) 100 MG tablet  Take 100 mg by mouth daily. 08/19/18   [provider]  doxycycline (VIBRAMYCIN) 100 MG capsule Take 1 capsule (100 mg total) by mouth 2 (two) times daily. 06/25/19   Ripley Lovecchio, Barbette Hair, MD  norethindrone (MICRONOR,CAMILA,ERRIN) 0.35 MG tablet Take 1 tablet by mouth at bedtime. 08/10/18   [provider]  ondansetron (ZOFRAN ODT) 4 MG disintegrating tablet Take 1 tablet (4 mg total) by mouth every 8 (eight) hours as needed for nausea or vomiting. 09/11/18   Rai, Vernelle Emerald, MD  polyethylene glycol (MIRALAX / GLYCOLAX) packet Take 17 g by mouth daily as needed for mild constipation. 09/30/16   Debbe Odea, MD  Vitamin D, Ergocalciferol, (DRISDOL) 50000 units CAPS capsule Take 50,000 Units by mouth every Sunday.  08/09/16   [provider]    Allergies    Patient has no known allergies.  Review of Systems   Review of Systems  Constitutional: Negative for fever.  Respiratory: Negative for shortness of breath.   Cardiovascular: Positive for leg swelling. Negative for chest pain.  Gastrointestinal: Negative for abdominal pain, nausea and vomiting.  Musculoskeletal: Negative for back pain.  Skin: Positive for color change.  All other systems reviewed and are negative.   Physical Exam Updated Vital Signs BP 106/68 (BP Location: Right Wrist)   Pulse 95   Temp 98.1 F (36.7 C) (Oral)   Resp 20   Ht 1.6 m (5\' 3" )   Wt 121.1 kg   LMP 06/16/2019   SpO2 96%   BMI 47.30 kg/m   Physical Exam Vitals and nursing note reviewed.  Constitutional:      Appearance: She is well-developed. She is obese.  HENT:     Head: Normocephalic and atraumatic.  Eyes:     Pupils: Pupils are equal, round, and reactive to light.  Cardiovascular:     Rate and Rhythm: Normal rate and regular rhythm.     Heart sounds: Normal heart sounds.  Pulmonary:     Effort: Pulmonary effort is normal. No respiratory distress.     Breath sounds: No wheezing.  Abdominal:     General: Bowel sounds  are normal.     Palpations: Abdomen is soft.     Tenderness: There is no abdominal tenderness.  Musculoskeletal:        General: Swelling present.     Cervical back: Neck supple.     Comments: Swelling of the right lower extremity(2+) greater than the left (1+), there is overlying erythema and warmth, no calf tenderness, 1+ DP pulse  Skin:    General: Skin is warm and dry.  Neurological:     Mental Status: She is alert and oriented to person, place, and time.  Psychiatric:        Mood and Affect: Mood normal.  ED Results / Procedures / Treatments   Labs (all labs ordered are listed, but only abnormal results are displayed) Labs Reviewed  CBC WITH DIFFERENTIAL/PLATELET - Abnormal; Notable for the following components:      Result Value   WBC 12.1 (*)    Neutro Abs 9.7 (*)    Monocytes Absolute 1.1 (*)    Abs Immature Granulocytes 0.25 (*)    All other components within normal limits  BASIC METABOLIC PANEL - Abnormal; Notable for the following components:   Glucose, Bld 123 (*)    BUN 21 (*)    Creatinine, Ser 1.74 (*)    GFR calc non Af Amer 34 (*)    GFR calc Af Amer 39 (*)    All other components within normal limits  D-DIMER, QUANTITATIVE (NOT AT [facility]) - Abnormal; Notable for the following components:   D-Dimer, Quant 2.18 (*)    All other components within normal limits    EKG None  Radiology CT Angio Chest PE W and/or Wo Contrast  Result Date: 06/25/2019 CLINICAL DATA:  Shortness of breath. Patient reports right leg swelling. EXAM: CT ANGIOGRAPHY CHEST WITH CONTRAST TECHNIQUE: Multidetector CT imaging of the chest was performed using the standard protocol during bolus administration of intravenous contrast. Multiplanar CT image reconstructions and MIPs were obtained to evaluate the vascular anatomy. CONTRAST:  73mL OMNIPAQUE IOHEXOL 350 MG/ML SOLN COMPARISON:  Chest radiograph 09/01/2018, no interval exams. Chest CTA 09/04/2016 FINDINGS: Cardiovascular: There are  no filling defects within the pulmonary arteries to suggest pulmonary embolus. The previous right-sided pulmonary arterial filling defects have resolved without residual. The thoracic aorta is normal in caliber. No aortic dissection. Left vertebral artery arises directly from the thoracic aorta, variant arch anatomy. Heart is normal in size. No pericardial effusion. Mediastinum/Nodes: No enlarged mediastinal or hilar lymph nodes. Esophagus is decompressed. Unchanged exophytic nodule from the inferior left lobe of the thyroid gland, previously evaluated with ultrasound 09/21/2016. Lungs/Pleura: Subsegmental atelectasis or scarring in the dependent right lower lobe. No focal airspace disease. No pulmonary edema. No pleural fluid. The trachea and mainstem bronchi are patent. Upper Abdomen: No acute findings. Musculoskeletal: Mild degenerative change in the midthoracic spine with vacuum phenomenon. There are no acute or suspicious osseous abnormalities. Review of the MIP images confirms the above findings. IMPRESSION: No pulmonary embolus or acute intrathoracic abnormality. The previous right-sided pulmonary emboli have resolved without residual. Electronically Signed   By: Keith Rake M.D.   On: 06/25/2019 03:44    Procedures Procedures (including critical care time)  Medications Ordered in ED Medications  iohexol (OMNIPAQUE) 300 MG/ML solution 80 mL (has no administration in time range)  vancomycin (VANCOCIN) IVPB 1000 mg/200 mL premix (1,000 mg Intravenous New Bag/Given 06/25/19 0125)  0.9 %  sodium chloride infusion ( Intravenous New Bag/Given 06/25/19 0124)  fentaNYL (SUBLIMAZE) injection 100 mcg (100 mcg Intravenous Given 06/25/19 0225)  enoxaparin (LOVENOX) injection 120 mg (120 mg Subcutaneous Given 06/25/19 0224)  iohexol (OMNIPAQUE) 350 MG/ML injection 80 mL (80 mLs Intravenous Contrast Given 06/25/19 0307)    ED Course  I have reviewed the triage vital signs and the nursing  notes.  Pertinent labs & imaging results that were available during my care of the patient were reviewed by me and considered in my medical decision making (see chart for details).  Clinical Course as of Jun 25 399  Mon Jun 25, 2019  0246 Suspect the majority of the patient swelling is related to cellulitis.  However, she has  a history of PE and DVT and is no longer on Eliquis.  Positive D-dimer.  On my reevaluation, she is noted to have O2 sats 92%.  She is a non-smoker and has clear breath sounds.  Will obtain CT PE study.   [CH]    Clinical Course User Index [CH] Krisha Beegle, Barbette Hair, MD   MDM Rules/Calculators/A&P  Patient presents with right lower extremity swelling and redness.  She is overall nontoxic and vital signs are reassuring.  She is afebrile.  She is morbidly obese which does limit exam but it does appear she has some asymmetric swelling and redness to the right lower extremity.  This is most consistent with cellulitis.  However, she has a history of DVTs and is not currently on anticoagulation.  Lab work obtained and patient was given IV vancomycin for presumed cellulitis.  White count is 12.  Creatinine is at baseline of 1.7.  D-dimer is elevated at 2.8.  See clinical course above.  On recheck, patient was resting comfortably.  O2 sats are 92%.  No chest pain or shortness of breath but given positive D-dimer and leg swelling, PE is a consideration.  CT scan was obtained CT scan does not show any evidence of PE.  Patient is clinically otherwise well-appearing.  Will discharge with doxycycline for cellulitis.  Patient was given 1 dose of 1 mg/kg of Lovenox and will return later for right lower extremity ultrasound.  After history, exam, and medical workup I feel the patient has been appropriately medically screened and is safe for discharge home. Pertinent diagnoses were discussed with the patient. Patient was given return precautions.   Final Clinical Impression(s) / ED  Diagnoses Final diagnoses:  Cellulitis of right lower extremity    Rx / DC Orders ED Discharge Orders         Ordered    doxycycline (VIBRAMYCIN) 100 MG capsule  2 times daily     06/25/19 0355    US Venous Img Lower Unilateral Right     06/25/19 0356           Merryl Hacker, MD 06/25/19 0401

## 2019-06-25 NOTE — ED Notes (Signed)
U/s in progress at bedside

## 2019-06-25 NOTE — ED Notes (Signed)
Pt to remain to await u/s this morning.

## 2019-06-25 NOTE — ED Notes (Signed)
Dr Rex Kras reviewed ultrasound result, no change in discharge instructions as previously discussed with pt.  Ready for discharge.

## 2019-06-25 NOTE — ED Triage Notes (Signed)
Pt reports right lew swelling x 2 weeks. Pt states that the pain is getting worse and now is warm to touch and turning red.

## 2019-11-05 ENCOUNTER — Emergency Department (HOSPITAL_BASED_OUTPATIENT_CLINIC_OR_DEPARTMENT_OTHER): Payer: 59

## 2019-11-05 ENCOUNTER — Encounter (HOSPITAL_BASED_OUTPATIENT_CLINIC_OR_DEPARTMENT_OTHER): Payer: Self-pay

## 2019-11-05 ENCOUNTER — Emergency Department (HOSPITAL_BASED_OUTPATIENT_CLINIC_OR_DEPARTMENT_OTHER)
Admission: EM | Admit: 2019-11-05 | Discharge: 2019-11-05 | Disposition: A | Discharge status: Home / Self Care | Payer: 59 | Attending: Emergency Medicine | Admitting: Emergency Medicine

## 2019-11-05 ENCOUNTER — Other Ambulatory Visit: Payer: Self-pay

## 2019-11-05 DIAGNOSIS — M7989 Other specified soft tissue disorders: Secondary | ICD-10-CM

## 2019-11-05 DIAGNOSIS — Z86711 Personal history of pulmonary embolism: Secondary | ICD-10-CM | POA: Insufficient documentation

## 2019-11-05 DIAGNOSIS — M79642 Pain in left hand: Secondary | ICD-10-CM | POA: Diagnosis present

## 2019-11-05 DIAGNOSIS — M109 Gout, unspecified: Secondary | ICD-10-CM | POA: Diagnosis not present

## 2019-11-05 DIAGNOSIS — Z86718 Personal history of other venous thrombosis and embolism: Secondary | ICD-10-CM | POA: Insufficient documentation

## 2019-11-05 DIAGNOSIS — I129 Hypertensive chronic kidney disease with stage 1 through stage 4 chronic kidney disease, or unspecified chronic kidney disease: Secondary | ICD-10-CM | POA: Diagnosis not present

## 2019-11-05 DIAGNOSIS — N182 Chronic kidney disease, stage 2 (mild): Secondary | ICD-10-CM | POA: Diagnosis not present

## 2019-11-05 LAB — BASIC METABOLIC PANEL WITH GFR
Anion gap: 13 (ref 5–15)
BUN: 32 mg/dL — ABNORMAL HIGH (ref 6–20)
CO2: 19 mmol/L — ABNORMAL LOW (ref 22–32)
Calcium: 7.7 mg/dL — ABNORMAL LOW (ref 8.9–10.3)
Chloride: 114 mmol/L — ABNORMAL HIGH (ref 98–111)
Creatinine, Ser: 3.95 mg/dL — ABNORMAL HIGH (ref 0.44–1.00)
GFR calc Af Amer: 15 mL/min — ABNORMAL LOW
GFR calc non Af Amer: 13 mL/min — ABNORMAL LOW
Glucose, Bld: 108 mg/dL — ABNORMAL HIGH (ref 70–99)
Potassium: 3.3 mmol/L — ABNORMAL LOW (ref 3.5–5.1)
Sodium: 146 mmol/L — ABNORMAL HIGH (ref 135–145)

## 2019-11-05 MED ORDER — HYDROCODONE-ACETAMINOPHEN 5-325 MG PO TABS: 1.0000 | ORAL_TABLET | Freq: Four times a day (QID) | ORAL | 0 refills | Status: DC | PRN

## 2019-11-05 MED ORDER — SODIUM BICARBONATE 650 MG PO TABS
1300.00 | ORAL_TABLET | ORAL | Status: DC
Start: 2019-11-03 — End: 2019-11-05

## 2019-11-05 MED ORDER — CALCIUM CARBONATE 1250 (500 CA) MG PO CHEW
500.00 | CHEWABLE_TABLET | ORAL | Status: DC
Start: 2019-11-04 — End: 2019-11-05

## 2019-11-05 MED ORDER — HYDROMORPHONE HCL 1 MG/ML IJ SOLN
1.0000 mg | Freq: Once | INTRAMUSCULAR | Status: AC
  Administered 2019-11-05: 1 mg via INTRAMUSCULAR
  Filled 2019-11-05: qty 1

## 2019-11-05 MED ORDER — GENERIC EXTERNAL MEDICATION
1.00 | Status: DC
Start: 2019-11-03 — End: 2019-11-05

## 2019-11-05 MED ORDER — PREDNISONE 10 MG (21) PO TBPK: ORAL_TABLET | Freq: Every day | ORAL | 0 refills | Status: DC

## 2019-11-05 MED FILL — PREDNISONE 10 MG TABS: 10 | 12 days supply | Qty: 42 | Fill #0

## 2019-11-05 MED FILL — HYDROCODON-APAP 5-325: 5-325 | 2 days supply | Qty: 10 | Fill #0

## 2019-11-05 NOTE — ED Provider Notes (Signed)
Signout from Dr. Sedonia Small.  49 year old female history of gout complaining of pain left wrist left hand with some swelling in the hand.  Recently admitted to [facility] for renal failure associated with her prior gout medications.  Creatinine elevated here better than a few weeks ago.  Plan is to follow-up on CBC along with duplex of arm.  Anticipate discharge with course of steroids and some pain medicine Physical Exam  BP 136/78 (BP Location: Right Arm)   Pulse 96   Temp 98.5 F (36.9 C) (Oral)   Resp 18   Ht 5\' 3"  (1.6 m)   Wt 120.7 kg   LMP 10/22/2019   SpO2 98%   BMI 47.12 kg/m   Physical Exam  ED Course/Procedures     Procedures  MDM  Patient's labs showing elevated creatinine of 3.95 but sounds like was better than when she was admitted to [facility].  CBC keeps clotting and the patient is refusing any further blood draws.  Per discharge plan from Dr. Sedonia Small will discharge on steroid taper plus minus pain medicines.  Will review with patient       Hayden Rasmussen, MD 11/06/19 1022

## 2019-11-05 NOTE — ED Provider Notes (Signed)
[facility] Emergency Department Provider Note MRN:  [id number]  Arrival date & time: 11/05/19     Chief Complaint   Joint Pain   History of Present Illness   Caitlin Taylor is a 49 y.o. year-old female with a history of DVT, hypertension presenting to the ED with chief complaint of joint pain.  Moderate to severe pain in the left hand and wrist.  Feels similar to prior gout flares.  Explains that she was just in the hospital for kidney problems and was taken off of her gout medicine.  Pain is constant worse with palpation or motion.  Denies fever, no chest pain or shortness of breath, no abdominal pain, no dysuria.  Review of Systems  A complete 10 system review of systems was obtained and all systems are negative except as noted in the HPI and PMH.   Patient's Health History    Past Medical History:  Diagnosis Date  . Chronic back pain   . CKD (chronic kidney disease) stage 2, GFR 60-89 ml/min   . DVT (deep venous thrombosis) ([place])   . Hypertension   . Lumbar stenosis   . Morbid obesity ([place])   . Pulmonary embolism ([place])    a. diagnosed in 08/2016. Started on Eliquis    Past Surgical History:  Procedure Laterality Date  . ABDOMINAL SURGERY    . CARPAL TUNNEL RELEASE    . CESAREAN SECTION    . INCISION AND DRAINAGE ABSCESS Left 09/08/2018   Procedure: INCISION AND DRAINAGE LEFT GROIN ABSCESS;  Surgeon: Leighton Ruff, MD;  Location: [facility];  Service: General;  Laterality: Left;  . IRRIGATION AND DEBRIDEMENT ABSCESS N/A 09/27/2016   Procedure: IRRIGATION AND DEBRIDEMENT OF ABDOMINAL WALL ABSCESS;  Surgeon: Greer Pickerel, MD;  Location: [facility];  Service: General;  Laterality: N/A;  . IRRIGATION AND DEBRIDEMENT ABSCESS Left 08/31/2018   Procedure: IRRIGATION AND DEBRIDEMENT ABSCESS THIGH/GROIN LEFT;  Surgeon: Coralie Keens, MD;  Location: [facility];  Service: General;  Laterality: Left;  Marland Kitchen MAXIMUM ACCESS (MAS)POSTERIOR LUMBAR INTERBODY FUSION  (PLIF) 1 LEVEL N/A 01/06/2016   Procedure: FOR MAXIMUM ACCESS (MAS) POSTERIOR LUMBAR INTERBODY FUSION (PLIF) LUMBAR FOUR-FIVE;  Surgeon: Kevan Ny Ditty, MD;  Location: [place] [facility];  Service: Neurosurgery;  Laterality: N/A;    Family History  Problem Relation Age of Onset  . Congestive Heart Failure Father     Social History   Socioeconomic History  . Marital status: Divorced    Spouse name: Not on file  . Number of children: Not on file  . Years of education: Not on file  . Highest education level: Not on file  Occupational History  . Not on file  Tobacco Use  . Smoking status: Never Smoker  . Smokeless tobacco: Never Used  Substance and Sexual Activity  . Alcohol use: Not Currently    Comment: rare  . Drug use: No  . Sexual activity: Not on file  Other Topics Concern  . Not on file  Social History Narrative  . Not on file   Social Determinants of Health   Financial Resource Strain:   . Difficulty of Paying Living Expenses:   Food Insecurity:   . Worried About Charity fundraiser in the Last Year:   . Arboriculturist in the Last Year:   Transportation Needs:   . Film/video editor (Medical):   Marland Kitchen Lack of Transportation (Non-Medical):   Physical Activity:   . Days of Exercise per  Week:   . Minutes of Exercise per Session:   Stress:   . Feeling of Stress :   Social Connections:   . Frequency of Communication with Friends and Family:   . Frequency of Social Gatherings with Friends and Family:   . Attends Religious Services:   . Active Member of Clubs or Organizations:   . Attends Archivist Meetings:   Marland Kitchen Marital Status:   Intimate Partner Violence:   . Fear of Current or Ex-Partner:   . Emotionally Abused:   Marland Kitchen Physically Abused:   . Sexually Abused:      Physical Exam   Vitals:   11/05/19 1138 11/05/19 1447  BP: 128/78 136/78  Pulse: (!) 102 96  Resp: 20 18  Temp: 98.5 F (36.9 C)   SpO2: 98% 98%    CONSTITUTIONAL: Well-appearing,  NAD NEURO:  Alert and oriented x 3, no focal deficits EYES:  eyes equal and reactive ENT/NECK:  no LAD, no JVD CARDIO: Regular rate, well-perfused, normal S1 and S2 PULM:  CTAB no wheezing or rhonchi GI/GU:  normal bowel sounds, non-distended, non-tender MSK/SPINE: Exquisite tenderness and edema to the left hand and wrist SKIN:  no rash, atraumatic PSYCH:  Appropriate speech and behavior  *Additional and/or pertinent findings included in MDM below  Diagnostic and Interventional Summary    EKG Interpretation  Date/Time:    Ventricular Rate:    PR Interval:    QRS Duration:   QT Interval:    QTC Calculation:   R Axis:     Text Interpretation:        Labs Reviewed  BASIC METABOLIC PANEL    DG Hand Complete Left  Final Result    DG Wrist Complete Left  Final Result    US Venous Img Upper Uni Left (DVT)    (Results Pending)    Medications  HYDROmorphone (DILAUDID) injection 1 mg (1 mg Intramuscular Given 11/05/19 1441)     Procedures  /  Critical Care Procedures  ED Course and Medical Decision Making  I have reviewed the triage vital signs, the nursing notes, and pertinent available records from the EMR.  Listed above are laboratory and imaging tests that I personally ordered, reviewed, and interpreted and then considered in my medical decision making (see below for details).      Suspect gout flare.  Recent admission for acute renal failure, will recheck labs.  There is swelling noted but no erythema or signs of cellulitis, doubt infectious process.  Patient has a history of DVT, will obtain ultrasound.  With pain well controlled, reassuring labs and imaging, patient may be appropriate for discharge on pain control or gout control medications.  Signed out to oncoming provider at shift change.  Barth Kirks. Sedonia Small, [place] [email or web address]  Final Clinical Impressions(s) / ED Diagnoses     ICD-10-CM   1.  Acute gout, unspecified cause, unspecified site  M10.9   2. Left arm swelling  M79.89 US Venous Img Upper Uni Left (DVT)    US Venous Img Upper Uni Left (DVT)    ED Discharge Orders    None       Discharge Instructions Discussed with and Provided to Patient:   Discharge Instructions   None       Maudie Flakes, MD 11/05/19 1515

## 2019-11-05 NOTE — ED Notes (Signed)
Pt to radiology/u/s

## 2019-11-05 NOTE — Discharge Instructions (Addendum)
You were evaluated in the emergency department for swelling in your left wrist and hand.  You had x-rays and an ultrasound of your arm that did not show any serious findings.  This is likely due to your gout.  We are treating you with a steroid taper and some pain medicine.  Please follow-up with your primary care doctor as scheduled.  Return if fever or any worsening symptoms.

## 2019-11-05 NOTE — ED Triage Notes (Signed)
Pt states that she was recently admitted to [facility] for a gout flare up. Pt reports her flare up has gotten worse since being discharged. Pt has swelling and redness to left hand, reports swelling to both feet. States she was taken off her gout medication r/t kidney function.

## 2020-02-12 ENCOUNTER — Other Ambulatory Visit: Payer: Self-pay | Admitting: Rheumatology

## 2020-02-12 DIAGNOSIS — R7989 Other specified abnormal findings of blood chemistry: Secondary | ICD-10-CM

## 2020-02-20 ENCOUNTER — Other Ambulatory Visit: Payer: 59

## 2020-02-22 ENCOUNTER — Ambulatory Visit
Admission: RE | Admit: 2020-02-22 | Discharge: 2020-02-22 | Disposition: A | Discharge status: Home / Self Care | Payer: 59 | Source: Ambulatory Visit | Attending: Rheumatology | Admitting: Rheumatology

## 2020-02-22 DIAGNOSIS — R7989 Other specified abnormal findings of blood chemistry: Secondary | ICD-10-CM

## 2021-03-24 ENCOUNTER — Emergency Department (HOSPITAL_BASED_OUTPATIENT_CLINIC_OR_DEPARTMENT_OTHER)
Admission: EM | Admit: 2021-03-24 | Discharge: 2021-03-24 | Disposition: A | Discharge status: Home / Self Care | Payer: Managed Care, Other (non HMO) | Attending: Emergency Medicine | Admitting: Emergency Medicine

## 2021-03-24 ENCOUNTER — Encounter (HOSPITAL_BASED_OUTPATIENT_CLINIC_OR_DEPARTMENT_OTHER): Payer: Self-pay

## 2021-03-24 ENCOUNTER — Telehealth (HOSPITAL_BASED_OUTPATIENT_CLINIC_OR_DEPARTMENT_OTHER): Payer: Self-pay | Admitting: Emergency Medicine

## 2021-03-24 ENCOUNTER — Other Ambulatory Visit: Payer: Self-pay

## 2021-03-24 DIAGNOSIS — N183 Chronic kidney disease, stage 3 unspecified: Secondary | ICD-10-CM | POA: Diagnosis not present

## 2021-03-24 DIAGNOSIS — I129 Hypertensive chronic kidney disease with stage 1 through stage 4 chronic kidney disease, or unspecified chronic kidney disease: Secondary | ICD-10-CM | POA: Diagnosis not present

## 2021-03-24 DIAGNOSIS — E876 Hypokalemia: Secondary | ICD-10-CM

## 2021-03-24 DIAGNOSIS — R112 Nausea with vomiting, unspecified: Secondary | ICD-10-CM

## 2021-03-24 DIAGNOSIS — R197 Diarrhea, unspecified: Secondary | ICD-10-CM

## 2021-03-24 DIAGNOSIS — N179 Acute kidney failure, unspecified: Secondary | ICD-10-CM | POA: Diagnosis not present

## 2021-03-24 LAB — COMPREHENSIVE METABOLIC PANEL
ALT: 100 U/L — ABNORMAL HIGH (ref 0–44)
AST: 87 U/L — ABNORMAL HIGH (ref 15–41)
Albumin: 4.5 g/dL (ref 3.5–5.0)
Alkaline Phosphatase: 97 U/L (ref 38–126)
Anion gap: 14 (ref 5–15)
BUN: 30 mg/dL — ABNORMAL HIGH (ref 6–20)
CO2: 26 mmol/L (ref 22–32)
Calcium: 9.4 mg/dL (ref 8.9–10.3)
Chloride: 101 mmol/L (ref 98–111)
Creatinine, Ser: 2.56 mg/dL — ABNORMAL HIGH (ref 0.44–1.00)
GFR, Estimated: 22 mL/min — ABNORMAL LOW (ref >60)
Glucose, Bld: 129 mg/dL — ABNORMAL HIGH (ref 70–99)
Potassium: 2.7 mmol/L — CL (ref 3.5–5.1)
Sodium: 141 mmol/L (ref 135–145)
Total Bilirubin: 1.2 mg/dL (ref 0.3–1.2)
Total Protein: 7.5 g/dL (ref 6.5–8.1)

## 2021-03-24 LAB — MAGNESIUM: Magnesium: 2 mg/dL (ref 1.7–2.4)

## 2021-03-24 LAB — URINALYSIS, ROUTINE W REFLEX MICROSCOPIC
Bilirubin Urine: NEGATIVE
Glucose, UA: NEGATIVE mg/dL
Ketones, ur: NEGATIVE mg/dL
Nitrite: NEGATIVE
Protein, ur: 100 mg/dL — AB
Specific Gravity, Urine: 1.025 (ref 1.005–1.030)
pH: 6 (ref 5.0–8.0)

## 2021-03-24 LAB — URINALYSIS, MICROSCOPIC (REFLEX): WBC, UA: >50 WBC/hpf (ref 0–5)

## 2021-03-24 LAB — CBC
HCT: 44.6 % (ref 36.0–46.0)
Hemoglobin: 15.1 g/dL — ABNORMAL HIGH (ref 12.0–15.0)
MCH: 31.3 pg (ref 26.0–34.0)
MCHC: 33.9 g/dL (ref 30.0–36.0)
MCV: 92.5 fL (ref 80.0–100.0)
Platelets: 217 10*3/uL (ref 150–400)
RBC: 4.82 MIL/uL (ref 3.87–5.11)
RDW: 14.6 % (ref 11.5–15.5)
WBC: 8.2 10*3/uL (ref 4.0–10.5)
nRBC: 0 % (ref 0.0–0.2)

## 2021-03-24 LAB — PREGNANCY, URINE: Preg Test, Ur: NEGATIVE

## 2021-03-24 LAB — LIPASE, BLOOD: Lipase: 48 U/L (ref 11–51)

## 2021-03-24 MED ORDER — ONDANSETRON 4 MG PO TBDP: 4.0000 mg | ORAL_TABLET | Freq: Three times a day (TID) | ORAL | 0 refills | Status: DC | PRN

## 2021-03-24 MED ORDER — ONDANSETRON 4 MG PO TBDP
4.0000 mg | ORAL_TABLET | Freq: Once | ORAL | Status: AC
  Administered 2021-03-24: 4 mg via ORAL
  Filled 2021-03-24: qty 1

## 2021-03-24 MED ORDER — POTASSIUM CHLORIDE ER 10 MEQ PO TBCR: 10.0000 meq | EXTENDED_RELEASE_TABLET | Freq: Every day | ORAL | 0 refills | Status: AC

## 2021-03-24 MED ORDER — POTASSIUM CHLORIDE CRYS ER 20 MEQ PO TBCR
60.0000 meq | EXTENDED_RELEASE_TABLET | Freq: Once | ORAL | Status: AC
  Administered 2021-03-24: 60 meq via ORAL
  Filled 2021-03-24: qty 3

## 2021-03-24 NOTE — ED Notes (Signed)
Pt requested to take her own bp meds , per PA she can , pt had a drink and she was given ice

## 2021-03-24 NOTE — Telephone Encounter (Signed)
Patient was seen earlier today.  Patient noted to have hypokalemia.  Patient prescription for potassium was not placed earlier today.  Patient to take potassium as prescribed and follow-up closely with PCP.

## 2021-03-24 NOTE — ED Provider Notes (Signed)
[place] [facility] Provider Note   CSN: [id number] Arrival date & time: 03/24/21  [id number]     History Chief Complaint  Patient presents with   Diarrhea    Caitlin Taylor is a 50 y.o. female with history of CKD stage III, hypertension, morbid obesity.  Presents to the emergency department with a chief complaint of nausea, vomiting, and diarrhea.  Patient reports that her symptoms have been present over the last 2 weeks.  Symptoms have been persistent over this time period.  Denies any alleviating factors.  Symptoms are not aggravated with oral intake.  Patient reports 2 episodes of vomiting in the last 24 hours.  Patient describes emesis as bilious.  Denies any hematemesis or coffee-ground emesis.  Patient denies any blood in stool or melena.  No recent antibiotic use, travel, or camping.  Patient reports that she did start rybelsus (semaglutide) for weight loss 2 weeks prior.  Patient endorses rare alcohol use.  Denies any EtOH use.  History of C-section x1.  No other abdominal surgeries.  LMP July, patient states that her menstrual periods are irregular.  No known sick contacts.     Diarrhea Associated symptoms: vomiting   Associated symptoms: no abdominal pain, no chills, no fever and no headaches       Past Medical History:  Diagnosis Date   Chronic back pain    CKD (chronic kidney disease) stage 2, GFR 60-89 ml/min    DVT (deep venous thrombosis) ([facility])    Hypertension    Lumbar stenosis    Morbid obesity ([place])    Pulmonary embolism ([place])    a. diagnosed in 08/2016. Started on Eliquis    Patient Active Problem List   Diagnosis Date Noted   Acute renal failure superimposed on stage 3 chronic kidney disease ([place])    Abscess 08/28/2018   Cellulitis of left groin 08/27/2018   Right leg DVT ([place]) 09/30/2016   Chronic diastolic CHF (congestive heart failure) ([place]) 09/22/2016   Edema 09/04/2016   History of pulmonary embolus ([facility]) 09/03/2016    Benign essential HTN    CKD (chronic kidney disease), stage III ([facility])    Lumbar stenosis    S/P lumbar fusion    Normocytic anemia    Constipation due to pain medication    Morbid obesity due to excess calories ([place])    Essential hypertension 01/05/2016    Past Surgical History:  Procedure Laterality Date   ABDOMINAL SURGERY     CARPAL TUNNEL RELEASE     CESAREAN SECTION     INCISION AND DRAINAGE ABSCESS Left 09/08/2018   Procedure: INCISION AND DRAINAGE LEFT GROIN ABSCESS;  Surgeon: Leighton Ruff, MD;  [facility];  Service: General;  Laterality: Left;   IRRIGATION AND DEBRIDEMENT ABSCESS N/A 09/27/2016   Procedure: IRRIGATION AND DEBRIDEMENT OF ABDOMINAL WALL ABSCESS;  Surgeon: Greer Pickerel, MD;  [facility];  Service: General;  Laterality: N/A;   IRRIGATION AND DEBRIDEMENT ABSCESS Left 08/31/2018   Procedure: IRRIGATION AND DEBRIDEMENT ABSCESS THIGH/GROIN LEFT;  Surgeon: Coralie Keens, MD;  [facility];  Service: General;  Laterality: Left;   MAXIMUM ACCESS (MAS)POSTERIOR LUMBAR INTERBODY FUSION (PLIF) 1 LEVEL N/A 01/06/2016   Procedure: FOR MAXIMUM ACCESS (MAS) POSTERIOR LUMBAR INTERBODY FUSION (PLIF) LUMBAR FOUR-FIVE;  Surgeon: Kevan Ny Ditty, MD;  Location: [facility];  Service: Neurosurgery;  Laterality: N/A;     OB History   No obstetric history on file.     Family History  Problem  Relation Age of Onset   Congestive Heart Failure Father     Social History   Tobacco Use   Smoking status: Never   Smokeless tobacco: Never  Vaping Use   Vaping Use: Never used  Substance Use Topics   Alcohol use: Not Currently    Comment: rare   Drug use: No    Home Medications Prior to Admission medications   Medication Sig Start Date End Date Taking? Authorizing Provider  acetaminophen (TYLENOL) 325 MG tablet Take 2 tablets (650 mg total) by mouth every 6 (six) hours as needed for moderate pain. 09/11/18   Rai, Vernelle Emerald, MD  allopurinol (ZYLOPRIM) 100 MG  tablet Take 100 mg by mouth daily. 08/19/18   [provider]  doxycycline (VIBRAMYCIN) 100 MG capsule Take 1 capsule (100 mg total) by mouth 2 (two) times daily. 06/25/19   Horton, Barbette Hair, MD  HYDROcodone-acetaminophen (NORCO/VICODIN) 5-325 MG tablet Take 1-2 tablets by mouth every 6 (six) hours as needed for severe pain. 11/05/19   Hayden Rasmussen, MD  metoprolol tartrate (LOPRESSOR) 25 MG tablet Take 0.5 tablets (12.5 mg total) by mouth 2 (two) times daily. 09/06/16   Rosita Fire, MD  MITIGARE 0.6 MG CAPS Take 0.6 mg by mouth 2 (two) times daily. 08/25/18   [provider]  norethindrone (MICRONOR,CAMILA,ERRIN) 0.35 MG tablet Take 1 tablet by mouth at bedtime. 08/10/18   [provider]  ondansetron (ZOFRAN ODT) 4 MG disintegrating tablet Take 1 tablet (4 mg total) by mouth every 8 (eight) hours as needed for nausea or vomiting. 09/11/18   Rai, Vernelle Emerald, MD  polyethylene glycol (MIRALAX / GLYCOLAX) packet Take 17 g by mouth daily as needed for mild constipation. 09/30/16   Debbe Odea, MD  predniSONE (STERAPRED UNI-PAK 21 TAB) 10 MG (21) TBPK tablet Take by mouth daily. Take 6 tabs by mouth daily  for 2 days, then 5 tabs for 2 days, then 4 tabs for 2 days, then 3 tabs for 2 days, 2 tabs for 2 days, then 1 tab by mouth daily for 2 days 11/05/19   Hayden Rasmussen, MD  spironolactone (ALDACTONE) 25 MG tablet Take 1 tablet (25 mg total) by mouth daily. 09/12/18   Rai, Ripudeep K, MD  torsemide (DEMADEX) 20 MG tablet Take 20 mg by mouth daily.    [provider]  Vitamin D, Ergocalciferol, (DRISDOL) 50000 units CAPS capsule Take 50,000 Units by mouth every Sunday.  08/09/16   [provider]    Allergies    Patient has no known allergies.  Review of Systems   Review of Systems  Constitutional:  Negative for chills and fever.  Eyes:  Negative for visual disturbance.  Respiratory:  Negative for shortness of breath.   Cardiovascular:  Negative  for chest pain.  Gastrointestinal:  Positive for diarrhea, nausea and vomiting. Negative for abdominal distention, abdominal pain, anal bleeding, blood in stool, constipation and rectal pain.  Genitourinary:  Negative for difficulty urinating, dysuria, flank pain, frequency, genital sores, hematuria, pelvic pain, vaginal bleeding, vaginal discharge and vaginal pain.  Musculoskeletal:  Negative for back pain and neck pain.  Skin:  Negative for color change and rash.  Neurological:  Negative for dizziness, syncope, light-headedness and headaches.  Psychiatric/Behavioral:  Negative for confusion.    Physical Exam Updated Vital Signs BP 108/81 (BP Location: Left Arm)   Pulse (!) 106   Temp 97.7 F (36.5 C) (Oral)   Resp 16   Ht '5\' 3"'$  (  1.6 m)   Wt 126.6 kg   SpO2 98%   BMI 49.42 kg/m   Physical Exam Vitals and nursing note reviewed.  Constitutional:      General: She is not in acute distress.    Appearance: She is not ill-appearing, toxic-appearing or diaphoretic.  HENT:     Head: Normocephalic.  Eyes:     General: No scleral icterus.       Right eye: No discharge.        Left eye: No discharge.  Cardiovascular:     Rate and Rhythm: Normal rate.  Pulmonary:     Effort: Pulmonary effort is normal.  Abdominal:     General: Abdomen is protuberant. Bowel sounds are normal. There is no distension. There are no signs of injury.     Palpations: Abdomen is soft. There is no mass or pulsatile mass.     Tenderness: There is no abdominal tenderness. There is no guarding or rebound.  Skin:    General: Skin is warm and dry.  Neurological:     General: No focal deficit present.     Mental Status: She is alert.     GCS: GCS eye subscore is 4. GCS verbal subscore is 5. GCS motor subscore is 6.  Psychiatric:        Behavior: Behavior is cooperative.    ED Results / Procedures / Treatments   Labs (all labs ordered are listed, but only abnormal results are displayed) Labs Reviewed   COMPREHENSIVE METABOLIC PANEL - Abnormal; Notable for the following components:      Result Value   Potassium 2.7 (*)    Glucose, Bld 129 (*)    BUN 30 (*)    Creatinine, Ser 2.56 (*)    AST 87 (*)    ALT 100 (*)    GFR, Estimated 22 (*)    All other components within normal limits  CBC - Abnormal; Notable for the following components:   Hemoglobin 15.1 (*)    All other components within normal limits  URINALYSIS, ROUTINE W REFLEX MICROSCOPIC - Abnormal; Notable for the following components:   APPearance TURBID (*)    Hgb urine dipstick MODERATE (*)    Protein, ur 100 (*)    Leukocytes,Ua MODERATE (*)    All other components within normal limits  URINALYSIS, MICROSCOPIC (REFLEX) - Abnormal; Notable for the following components:   Bacteria, UA MANY (*)    All other components within normal limits  LIPASE, BLOOD  PREGNANCY, URINE  MAGNESIUM    EKG EKG Interpretation  Date/Time:  Tuesday March 24 2021 09:57:18 EDT Ventricular Rate:  100 PR Interval:  150 QRS Duration: 96 QT Interval:  372 QTC Calculation: 479 R Axis:   12 Text Interpretation: Normal sinus rhythm Minimal voltage criteria for LVH, may be normal variant ( Cornell product ) Borderline ECG NSR Confirmed by Lavenia Atlas [id number]) on 03/24/2021 11:25:35 AM  Radiology No results found.  Procedures Procedures   Medications Ordered in ED Medications  potassium chloride SA (KLOR-CON) CR tablet 60 mEq (60 mEq Oral Given 03/24/21 1009)  ondansetron (ZOFRAN-ODT) disintegrating tablet 4 mg (4 mg Oral Given 03/24/21 1433)    ED Course  I have reviewed the triage vital signs and the nursing notes.  Pertinent labs & imaging results that were available during my care of the patient were reviewed by me and considered in my medical decision making (see chart for details).    MDM Rules/Calculators/A&P  Alert 50 year old female no acute distress, nontoxic-appearing.  Presents to ED with  chief complaint of nausea, vomiting, and diarrhea.  Symptoms have been present over the last 2 weeks.  Patient reports starting Rybelsus(semaglutide) prior to symptom onset.  Patient denies any recent antibiotic use, travel, or camping.  Abdomen soft, nondistended, nontender, no peritoneal signs.  Lab work obtained while patient was in triage. Patient noted to have hypokalemia with potassium at 2.7.  Patient given 60 mEq of oral potassium.  Will check magnesium and obtain EKG. BUN and creatinine elevated at 30 and 2.56 respectively.  Patient has history of CKD stage III.   Suspect that patient's hypokalemia and elevated BUN/creatinine is secondary to recurrent vomiting and diarrhea.  EKG shows normal sinus rhythm Magnesium within normal limits Patient able to tolerate p.o. intake without difficulty.  Will prescribe patient with Zofran and potassium.  Patient advised to hold diuretic medication.  Patient to follow-up closely with primary care provider for repeat lab testing.  Patient encouraged to increase water intake.  Discussed results, findings, treatment and follow up. Patient advised of return precautions. Patient verbalized understanding and agreed with plan.  Patient care and treatment were discussed with attending physician Dr. Dina Rich.  Final Clinical Impression(s) / ED Diagnoses Final diagnoses:  Nausea vomiting and diarrhea  Hypokalemia  AKI (acute kidney injury) ([place])    Rx / DC Orders ED Discharge Orders          Ordered    ondansetron (ZOFRAN ODT) 4 MG disintegrating tablet  Every 8 hours PRN        03/24/21 1432             Loni Beckwith, PA-C 03/24/21 2204    Horton, Alvin Critchley, DO 03/26/21 1529

## 2021-03-24 NOTE — ED Notes (Addendum)
Pt states she cannot void still. No diarrhea since arrival

## 2021-03-24 NOTE — Discharge Instructions (Addendum)
You came to the emergency department today to be evaluated for your nausea, vomiting, and diarrhea.  Your lab work showed that your potassium was low and you had an acute kidney injury.  You were given potassium in the emergency department and prescribed potassium.  Please take this as prescribed.  Please make sure to stay well-hydrated and hold your diuretic medication over the next 3 days.  Please follow-up with your primary care provider on Friday to have lab work rechecked.  I have given you prescription for Zofran.  You may take 1 pill every 8 hours as needed for nausea and vomiting.  Please do not take your semaglutide medication until you can follow-up with your primary care provider.  Get help right away if: You have pain in your chest, neck, arm, or jaw. You feel extremely weak or you faint. You have persistent vomiting. You have vomit that is bright red or looks like black coffee grounds. You have bloody or black stools or stools that look like tar. You have a severe headache, a stiff neck, or both. You have severe pain, cramping, or bloating in your abdomen. You have difficulty breathing, or you are breathing very quickly. Your heart is beating very quickly. Your skin feels cold and clammy. You feel confused. You have signs of dehydration, such as: Dark urine, very little urine, or no urine. Cracked lips. Dry mouth. Sunken eyes. Sleepiness. Weakness.

## 2021-03-24 NOTE — ED Triage Notes (Signed)
Pt arrives ambulatory to ED with c/o diarrhea X2 weeks since starting a new medication for weight loss. Pt reports she started vomiting today. Pt reports she has lost some weight in the last 2 weeks.

## 2022-10-26 ENCOUNTER — Inpatient Hospital Stay (HOSPITAL_COMMUNITY)
Admission: EM | Admit: 2022-10-26 | Discharge: 2022-11-04 | DRG: 345 | Disposition: A | Discharge status: Home Health | Payer: Managed Care, Other (non HMO)

## 2022-10-26 DIAGNOSIS — M109 Gout, unspecified: Secondary | ICD-10-CM | POA: Diagnosis present

## 2022-10-26 DIAGNOSIS — K611 Rectal abscess: Secondary | ICD-10-CM | POA: Diagnosis not present

## 2022-10-26 DIAGNOSIS — Z6841 Body Mass Index (BMI) 40.0 and over, adult: Secondary | ICD-10-CM

## 2022-10-26 DIAGNOSIS — L03317 Cellulitis of buttock: Secondary | ICD-10-CM | POA: Diagnosis present

## 2022-10-26 DIAGNOSIS — I129 Hypertensive chronic kidney disease with stage 1 through stage 4 chronic kidney disease, or unspecified chronic kidney disease: Secondary | ICD-10-CM | POA: Diagnosis present

## 2022-10-26 DIAGNOSIS — Z86711 Personal history of pulmonary embolism: Secondary | ICD-10-CM

## 2022-10-26 DIAGNOSIS — G8929 Other chronic pain: Secondary | ICD-10-CM | POA: Diagnosis present

## 2022-10-26 DIAGNOSIS — Z8249 Family history of ischemic heart disease and other diseases of the circulatory system: Secondary | ICD-10-CM

## 2022-10-26 DIAGNOSIS — Z86718 Personal history of other venous thrombosis and embolism: Secondary | ICD-10-CM

## 2022-10-26 DIAGNOSIS — Z1611 Resistance to penicillins: Secondary | ICD-10-CM | POA: Diagnosis present

## 2022-10-26 DIAGNOSIS — M549 Dorsalgia, unspecified: Secondary | ICD-10-CM | POA: Diagnosis present

## 2022-10-26 DIAGNOSIS — L0291 Cutaneous abscess, unspecified: Secondary | ICD-10-CM

## 2022-10-26 DIAGNOSIS — B9620 Unspecified Escherichia coli [E. coli] as the cause of diseases classified elsewhere: Secondary | ICD-10-CM | POA: Diagnosis present

## 2022-10-26 DIAGNOSIS — N1832 Chronic kidney disease, stage 3b: Secondary | ICD-10-CM | POA: Diagnosis present

## 2022-10-26 DIAGNOSIS — E876 Hypokalemia: Secondary | ICD-10-CM | POA: Diagnosis present

## 2022-10-26 NOTE — ED Triage Notes (Signed)
Arrived from home for Boil/abscess.

## 2022-10-27 ENCOUNTER — Encounter (HOSPITAL_COMMUNITY): Payer: Self-pay

## 2022-10-27 ENCOUNTER — Other Ambulatory Visit: Payer: Self-pay

## 2022-10-27 ENCOUNTER — Observation Stay (HOSPITAL_COMMUNITY): Payer: Managed Care, Other (non HMO) | Admitting: Certified Registered Nurse Anesthetist

## 2022-10-27 ENCOUNTER — Encounter (HOSPITAL_COMMUNITY): Admission: EM | Disposition: A | Discharge status: Home Health | Payer: Self-pay | Source: Home / Self Care

## 2022-10-27 ENCOUNTER — Emergency Department (HOSPITAL_COMMUNITY): Payer: Managed Care, Other (non HMO)

## 2022-10-27 DIAGNOSIS — L03317 Cellulitis of buttock: Secondary | ICD-10-CM | POA: Diagnosis present

## 2022-10-27 DIAGNOSIS — N189 Chronic kidney disease, unspecified: Secondary | ICD-10-CM | POA: Diagnosis not present

## 2022-10-27 DIAGNOSIS — B9620 Unspecified Escherichia coli [E. coli] as the cause of diseases classified elsewhere: Secondary | ICD-10-CM | POA: Diagnosis present

## 2022-10-27 DIAGNOSIS — K611 Rectal abscess: Secondary | ICD-10-CM | POA: Diagnosis present

## 2022-10-27 DIAGNOSIS — I509 Heart failure, unspecified: Secondary | ICD-10-CM

## 2022-10-27 DIAGNOSIS — Z1611 Resistance to penicillins: Secondary | ICD-10-CM | POA: Diagnosis present

## 2022-10-27 DIAGNOSIS — I129 Hypertensive chronic kidney disease with stage 1 through stage 4 chronic kidney disease, or unspecified chronic kidney disease: Secondary | ICD-10-CM | POA: Diagnosis present

## 2022-10-27 DIAGNOSIS — I10 Essential (primary) hypertension: Secondary | ICD-10-CM | POA: Diagnosis not present

## 2022-10-27 DIAGNOSIS — E876 Hypokalemia: Secondary | ICD-10-CM | POA: Diagnosis present

## 2022-10-27 DIAGNOSIS — Z86711 Personal history of pulmonary embolism: Secondary | ICD-10-CM | POA: Diagnosis not present

## 2022-10-27 DIAGNOSIS — D631 Anemia in chronic kidney disease: Secondary | ICD-10-CM | POA: Diagnosis not present

## 2022-10-27 DIAGNOSIS — N1832 Chronic kidney disease, stage 3b: Secondary | ICD-10-CM | POA: Diagnosis present

## 2022-10-27 DIAGNOSIS — Z8249 Family history of ischemic heart disease and other diseases of the circulatory system: Secondary | ICD-10-CM | POA: Diagnosis not present

## 2022-10-27 DIAGNOSIS — G8929 Other chronic pain: Secondary | ICD-10-CM | POA: Diagnosis present

## 2022-10-27 DIAGNOSIS — D649 Anemia, unspecified: Secondary | ICD-10-CM | POA: Diagnosis not present

## 2022-10-27 DIAGNOSIS — I130 Hypertensive heart and chronic kidney disease with heart failure and stage 1 through stage 4 chronic kidney disease, or unspecified chronic kidney disease: Secondary | ICD-10-CM | POA: Diagnosis not present

## 2022-10-27 DIAGNOSIS — M549 Dorsalgia, unspecified: Secondary | ICD-10-CM | POA: Diagnosis present

## 2022-10-27 DIAGNOSIS — I82409 Acute embolism and thrombosis of unspecified deep veins of unspecified lower extremity: Secondary | ICD-10-CM | POA: Diagnosis not present

## 2022-10-27 DIAGNOSIS — Z6841 Body Mass Index (BMI) 40.0 and over, adult: Secondary | ICD-10-CM | POA: Diagnosis not present

## 2022-10-27 DIAGNOSIS — Z86718 Personal history of other venous thrombosis and embolism: Secondary | ICD-10-CM | POA: Diagnosis not present

## 2022-10-27 DIAGNOSIS — M109 Gout, unspecified: Secondary | ICD-10-CM | POA: Diagnosis present

## 2022-10-27 DIAGNOSIS — F17210 Nicotine dependence, cigarettes, uncomplicated: Secondary | ICD-10-CM | POA: Diagnosis not present

## 2022-10-27 LAB — BASIC METABOLIC PANEL
Anion gap: 12 (ref 5–15)
Anion gap: 11 (ref 5–15)
BUN: 31 mg/dL — ABNORMAL HIGH (ref 6–20)
BUN: 29 mg/dL — ABNORMAL HIGH (ref 6–20)
CO2: 25 mmol/L (ref 22–32)
CO2: 24 mmol/L (ref 22–32)
Calcium: 7.9 mg/dL — ABNORMAL LOW (ref 8.9–10.3)
Calcium: 7.8 mg/dL — ABNORMAL LOW (ref 8.9–10.3)
Chloride: 103 mmol/L (ref 98–111)
Chloride: 107 mmol/L (ref 98–111)
Creatinine, Ser: 2.25 mg/dL — ABNORMAL HIGH (ref 0.44–1.00)
Creatinine, Ser: 2.13 mg/dL — ABNORMAL HIGH (ref 0.44–1.00)
GFR, Estimated: 26 mL/min — ABNORMAL LOW (ref >60)
GFR, Estimated: 28 mL/min — ABNORMAL LOW (ref >60)
Glucose, Bld: 98 mg/dL (ref 70–99)
Glucose, Bld: 108 mg/dL — ABNORMAL HIGH (ref 70–99)
Potassium: 3 mmol/L — ABNORMAL LOW (ref 3.5–5.1)
Potassium: 2.8 mmol/L — ABNORMAL LOW (ref 3.5–5.1)
Sodium: 140 mmol/L (ref 135–145)
Sodium: 142 mmol/L (ref 135–145)

## 2022-10-27 LAB — CBC
HCT: 32.7 % — ABNORMAL LOW (ref 36.0–46.0)
Hemoglobin: 10.9 g/dL — ABNORMAL LOW (ref 12.0–15.0)
MCH: 29.8 pg (ref 26.0–34.0)
MCHC: 33.3 g/dL (ref 30.0–36.0)
MCV: 89.3 fL (ref 80.0–100.0)
Platelets: 217 10*3/uL (ref 150–400)
RBC: 3.66 MIL/uL — ABNORMAL LOW (ref 3.87–5.11)
RDW: 14.9 % (ref 11.5–15.5)
WBC: 22.8 10*3/uL — ABNORMAL HIGH (ref 4.0–10.5)
nRBC: 0 % (ref 0.0–0.2)

## 2022-10-27 LAB — CBC WITH DIFFERENTIAL/PLATELET
Abs Immature Granulocytes: 0.5 10*3/uL — ABNORMAL HIGH (ref 0.00–0.07)
Band Neutrophils: 6 %
Basophils Absolute: 0 10*3/uL (ref 0.0–0.1)
Basophils Relative: 0 %
Eosinophils Absolute: 0 10*3/uL (ref 0.0–0.5)
Eosinophils Relative: 0 %
HCT: 34.4 % — ABNORMAL LOW (ref 36.0–46.0)
Hemoglobin: 11.5 g/dL — ABNORMAL LOW (ref 12.0–15.0)
Lymphocytes Relative: 7 %
Lymphs Abs: 1.7 10*3/uL (ref 0.7–4.0)
MCH: 30.1 pg (ref 26.0–34.0)
MCHC: 33.4 g/dL (ref 30.0–36.0)
MCV: 90.1 fL (ref 80.0–100.0)
Metamyelocytes Relative: 2 %
Monocytes Absolute: 1.2 10*3/uL — ABNORMAL HIGH (ref 0.1–1.0)
Monocytes Relative: 5 %
Neutro Abs: 20.3 10*3/uL — ABNORMAL HIGH (ref 1.7–7.7)
Neutrophils Relative %: 80 %
Platelets: 218 10*3/uL (ref 150–400)
RBC: 3.82 MIL/uL — ABNORMAL LOW (ref 3.87–5.11)
RDW: 14.9 % (ref 11.5–15.5)
WBC: 23.6 10*3/uL — ABNORMAL HIGH (ref 4.0–10.5)
nRBC: 0 % (ref 0.0–0.2)

## 2022-10-27 LAB — LACTIC ACID, PLASMA
Lactic Acid, Venous: 1.9 mmol/L (ref 0.5–1.9)
Lactic Acid, Venous: 1.9 mmol/L (ref 0.5–1.9)

## 2022-10-27 SURGERY — EXAM UNDER ANESTHESIA
Anesthesia: General

## 2022-10-27 MED ORDER — SUGAMMADEX SODIUM 200 MG/2ML IV SOLN
INTRAVENOUS | Status: DC | PRN
  Administered 2022-10-27: 100 mg via INTRAVENOUS
  Administered 2022-10-27: 300 mg via INTRAVENOUS

## 2022-10-27 MED ORDER — DEXAMETHASONE SODIUM PHOSPHATE 10 MG/ML IJ SOLN
INTRAMUSCULAR | Status: DC | PRN
  Administered 2022-10-27: 10 mg via INTRAVENOUS

## 2022-10-27 MED ORDER — 0.9 % SODIUM CHLORIDE (POUR BTL) OPTIME
TOPICAL | Status: DC | PRN
  Administered 2022-10-27: 1000 mL

## 2022-10-27 MED ORDER — HYDROMORPHONE HCL 1 MG/ML IJ SOLN: 0.5000 mg | INTRAMUSCULAR | Status: DC | PRN

## 2022-10-27 MED ORDER — ENOXAPARIN SODIUM 40 MG/0.4ML IJ SOSY: 40.0000 mg | PREFILLED_SYRINGE | INTRAMUSCULAR | Status: DC

## 2022-10-27 MED ORDER — SODIUM CHLORIDE 0.9 % IV SOLN: INTRAVENOUS | Status: DC

## 2022-10-27 MED ORDER — LACTATED RINGERS IV SOLN: INTRAVENOUS | Status: DC | PRN

## 2022-10-27 MED ORDER — ACETAMINOPHEN 650 MG RE SUPP: 650.0000 mg | Freq: Four times a day (QID) | RECTAL | Status: DC | PRN

## 2022-10-27 MED ORDER — ESMOLOL HCL 100 MG/10ML IV SOLN
INTRAVENOUS | Status: DC | PRN
  Administered 2022-10-27: 30 mg via INTRAVENOUS
  Administered 2022-10-27 (×2): 20 mg via INTRAVENOUS

## 2022-10-27 MED ORDER — PROPOFOL 10 MG/ML IV BOLUS
INTRAVENOUS | Status: DC | PRN
  Administered 2022-10-27: 50 mg via INTRAVENOUS
  Administered 2022-10-27: 200 mg via INTRAVENOUS

## 2022-10-27 MED ORDER — MIDAZOLAM HCL 5 MG/5ML IJ SOLN
INTRAMUSCULAR | Status: DC | PRN
  Administered 2022-10-27: 1 mg via INTRAVENOUS

## 2022-10-27 MED ORDER — ONDANSETRON HCL 4 MG/2ML IJ SOLN
INTRAMUSCULAR | Status: AC
  Filled 2022-10-27: qty 2

## 2022-10-27 MED ORDER — METOPROLOL TARTRATE 25 MG PO TABS
25.0000 mg | ORAL_TABLET | Freq: Every day | ORAL | Status: DC
  Administered 2022-10-27 – 2022-11-04 (×9): 25 mg via ORAL
  Filled 2022-10-27 (×9): qty 1

## 2022-10-27 MED ORDER — ROCURONIUM BROMIDE 10 MG/ML (PF) SYRINGE
PREFILLED_SYRINGE | INTRAVENOUS | Status: AC
  Filled 2022-10-27: qty 10

## 2022-10-27 MED ORDER — ONDANSETRON HCL 4 MG/2ML IJ SOLN
4.0000 mg | Freq: Four times a day (QID) | INTRAMUSCULAR | Status: DC | PRN
  Administered 2022-10-31 – 2022-11-01 (×4): 4 mg via INTRAVENOUS
  Filled 2022-10-27 (×4): qty 2

## 2022-10-27 MED ORDER — POTASSIUM CHLORIDE 10 MEQ/100ML IV SOLN
10.0000 meq | INTRAVENOUS | Status: AC
  Administered 2022-10-27 (×3): 10 meq via INTRAVENOUS
  Filled 2022-10-27 (×3): qty 100

## 2022-10-27 MED ORDER — BUPIVACAINE HCL (PF) 0.25 % IJ SOLN
INTRAMUSCULAR | Status: AC
  Filled 2022-10-27: qty 30

## 2022-10-27 MED ORDER — FENTANYL CITRATE (PF) 100 MCG/2ML IJ SOLN
INTRAMUSCULAR | Status: DC | PRN
  Administered 2022-10-27: 100 ug via INTRAVENOUS

## 2022-10-27 MED ORDER — OXYCODONE HCL 5 MG/5ML PO SOLN: 5.0000 mg | Freq: Once | ORAL | Status: DC | PRN

## 2022-10-27 MED ORDER — ROCURONIUM BROMIDE 100 MG/10ML IV SOLN
INTRAVENOUS | Status: DC | PRN
  Administered 2022-10-27: 40 mg via INTRAVENOUS

## 2022-10-27 MED ORDER — FENTANYL CITRATE PF 50 MCG/ML IJ SOSY
PREFILLED_SYRINGE | INTRAMUSCULAR | Status: AC
  Filled 2022-10-27: qty 1

## 2022-10-27 MED ORDER — SODIUM CHLORIDE 0.9 % IV BOLUS
1000.0000 mL | Freq: Once | INTRAVENOUS | Status: AC
  Administered 2022-10-27: 1000 mL via INTRAVENOUS

## 2022-10-27 MED ORDER — PROPOFOL 10 MG/ML IV BOLUS
INTRAVENOUS | Status: AC
  Filled 2022-10-27: qty 20

## 2022-10-27 MED ORDER — SUCCINYLCHOLINE CHLORIDE 200 MG/10ML IV SOSY
PREFILLED_SYRINGE | INTRAVENOUS | Status: DC | PRN
  Administered 2022-10-27: 160 mg via INTRAVENOUS

## 2022-10-27 MED ORDER — POLYETHYLENE GLYCOL 3350 17 G PO PACK: 17.0000 g | PACK | Freq: Every day | ORAL | Status: DC | PRN

## 2022-10-27 MED ORDER — BUPIVACAINE-EPINEPHRINE (PF) 0.5% -1:200000 IJ SOLN
INTRAMUSCULAR | Status: AC
  Filled 2022-10-27: qty 30

## 2022-10-27 MED ORDER — ACETAMINOPHEN 325 MG PO TABS: 650.0000 mg | ORAL_TABLET | Freq: Four times a day (QID) | ORAL | Status: DC | PRN

## 2022-10-27 MED ORDER — GLYCOPYRROLATE 0.2 MG/ML IJ SOLN
INTRAMUSCULAR | Status: DC | PRN
  Administered 2022-10-27: .1 mg via INTRAVENOUS

## 2022-10-27 MED ORDER — ONDANSETRON 4 MG PO TBDP: 4.0000 mg | ORAL_TABLET | Freq: Four times a day (QID) | ORAL | Status: DC | PRN

## 2022-10-27 MED ORDER — LIDOCAINE HCL (CARDIAC) PF 100 MG/5ML IV SOSY
PREFILLED_SYRINGE | INTRAVENOUS | Status: DC | PRN
  Administered 2022-10-27: 100 mg via INTRAVENOUS

## 2022-10-27 MED ORDER — OXYCODONE HCL 5 MG PO TABS
5.0000 mg | ORAL_TABLET | ORAL | Status: DC | PRN
  Administered 2022-10-27 – 2022-10-30 (×4): 5 mg via ORAL
  Administered 2022-11-02 – 2022-11-04 (×3): 10 mg via ORAL
  Filled 2022-10-27 (×2): qty 1
  Filled 2022-10-27 (×3): qty 2
  Filled 2022-10-27 (×2): qty 1

## 2022-10-27 MED ORDER — DOCUSATE SODIUM 100 MG PO CAPS
100.0000 mg | ORAL_CAPSULE | Freq: Two times a day (BID) | ORAL | Status: DC
  Administered 2022-10-27 – 2022-10-29 (×4): 100 mg via ORAL
  Filled 2022-10-27 (×11): qty 1

## 2022-10-27 MED ORDER — CHLORHEXIDINE GLUCONATE 0.12 % MT SOLN
15.0000 mL | Freq: Once | OROMUCOSAL | Status: AC
  Administered 2022-10-27: 15 mL via OROMUCOSAL

## 2022-10-27 MED ORDER — PHENYLEPHRINE 80 MCG/ML (10ML) SYRINGE FOR IV PUSH (FOR BLOOD PRESSURE SUPPORT)
PREFILLED_SYRINGE | INTRAVENOUS | Status: AC
  Filled 2022-10-27: qty 10

## 2022-10-27 MED ORDER — DEXAMETHASONE SODIUM PHOSPHATE 10 MG/ML IJ SOLN
INTRAMUSCULAR | Status: AC
  Filled 2022-10-27: qty 1

## 2022-10-27 MED ORDER — ALLOPURINOL 100 MG PO TABS
200.0000 mg | ORAL_TABLET | Freq: Every day | ORAL | Status: DC
  Administered 2022-10-28 – 2022-11-04 (×8): 200 mg via ORAL
  Filled 2022-10-27 (×9): qty 2

## 2022-10-27 MED ORDER — ACETAMINOPHEN 500 MG PO TABS
1000.0000 mg | ORAL_TABLET | Freq: Once | ORAL | Status: AC
  Administered 2022-10-27: 1000 mg via ORAL
  Filled 2022-10-27: qty 2

## 2022-10-27 MED ORDER — BUPIVACAINE-EPINEPHRINE 0.25% -1:200000 IJ SOLN
INTRAMUSCULAR | Status: DC | PRN
  Administered 2022-10-27: 16 mL

## 2022-10-27 MED ORDER — ENOXAPARIN SODIUM 40 MG/0.4ML IJ SOSY
40.0000 mg | PREFILLED_SYRINGE | INTRAMUSCULAR | Status: DC
  Administered 2022-10-28 – 2022-11-04 (×8): 40 mg via SUBCUTANEOUS
  Filled 2022-10-27 (×8): qty 0.4

## 2022-10-27 MED ORDER — SUCCINYLCHOLINE CHLORIDE 200 MG/10ML IV SOSY
PREFILLED_SYRINGE | INTRAVENOUS | Status: AC
  Filled 2022-10-27: qty 10

## 2022-10-27 MED ORDER — FENTANYL CITRATE (PF) 100 MCG/2ML IJ SOLN
INTRAMUSCULAR | Status: AC
  Filled 2022-10-27: qty 2

## 2022-10-27 MED ORDER — METHOCARBAMOL 1000 MG/10ML IJ SOLN: 1000.0000 mg | Freq: Four times a day (QID) | INTRAVENOUS | Status: DC | PRN

## 2022-10-27 MED ORDER — PIPERACILLIN-TAZOBACTAM 3.375 G IVPB 30 MIN
3.3750 g | Freq: Once | INTRAVENOUS | Status: AC
  Administered 2022-10-27: 3.375 g via INTRAVENOUS
  Filled 2022-10-27: qty 50

## 2022-10-27 MED ORDER — VANCOMYCIN HCL IN DEXTROSE 1-5 GM/200ML-% IV SOLN
1000.0000 mg | Freq: Once | INTRAVENOUS | Status: AC
  Administered 2022-10-27: 1000 mg via INTRAVENOUS
  Filled 2022-10-27: qty 200

## 2022-10-27 MED ORDER — PIPERACILLIN-TAZOBACTAM 3.375 G IVPB
3.3750 g | Freq: Three times a day (TID) | INTRAVENOUS | Status: DC
  Administered 2022-10-27 – 2022-11-01 (×16): 3.375 g via INTRAVENOUS
  Filled 2022-10-27 (×16): qty 50

## 2022-10-27 MED ORDER — MIDAZOLAM HCL 2 MG/2ML IJ SOLN
INTRAMUSCULAR | Status: AC
  Filled 2022-10-27: qty 2

## 2022-10-27 MED ORDER — ESMOLOL HCL 100 MG/10ML IV SOLN
INTRAVENOUS | Status: AC
  Filled 2022-10-27: qty 10

## 2022-10-27 MED ORDER — ENOXAPARIN SODIUM 40 MG/0.4ML IJ SOSY
40.0000 mg | PREFILLED_SYRINGE | INTRAMUSCULAR | Status: DC
  Administered 2022-10-27: 40 mg via SUBCUTANEOUS
  Filled 2022-10-27: qty 0.4

## 2022-10-27 MED ORDER — FENTANYL CITRATE PF 50 MCG/ML IJ SOSY
25.0000 ug | PREFILLED_SYRINGE | INTRAMUSCULAR | Status: DC | PRN
  Administered 2022-10-27: 50 ug via INTRAVENOUS

## 2022-10-27 MED ORDER — DIBUCAINE (PERIANAL) 1 % EX OINT
TOPICAL_OINTMENT | CUTANEOUS | Status: AC
  Filled 2022-10-27: qty 28

## 2022-10-27 MED ORDER — ORAL CARE MOUTH RINSE: 15.0000 mL | Freq: Once | OROMUCOSAL | Status: AC

## 2022-10-27 MED ORDER — TORSEMIDE 100 MG PO TABS
100.0000 mg | ORAL_TABLET | Freq: Every day | ORAL | Status: DC
  Administered 2022-10-28 – 2022-11-04 (×8): 100 mg via ORAL
  Filled 2022-10-27 (×3): qty 1
  Filled 2022-10-27: qty 5
  Filled 2022-10-27 (×3): qty 1
  Filled 2022-10-27: qty 5

## 2022-10-27 MED ORDER — ONDANSETRON HCL 4 MG/2ML IJ SOLN
INTRAMUSCULAR | Status: DC | PRN
  Administered 2022-10-27: 4 mg via INTRAVENOUS

## 2022-10-27 MED ORDER — CLINDAMYCIN PHOSPHATE 600 MG/50ML IV SOLN
600.0000 mg | Freq: Once | INTRAVENOUS | Status: AC
  Administered 2022-10-27: 600 mg via INTRAVENOUS
  Filled 2022-10-27: qty 50

## 2022-10-27 MED ORDER — OXYCODONE HCL 5 MG PO TABS: 5.0000 mg | ORAL_TABLET | Freq: Once | ORAL | Status: DC | PRN

## 2022-10-27 SURGICAL SUPPLY — 34 items
BAG COUNTER SPONGE SURGICOUNT (BAG) IMPLANT
BLADE HEX COATED 2.75 (ELECTRODE) ×1 IMPLANT
BLADE SURG 15 STRL LF DISP TIS (BLADE) ×1 IMPLANT
BLADE SURG 15 STRL SS (BLADE) ×1
BNDG GAUZE DERMACEA FLUFF 4 (GAUZE/BANDAGES/DRESSINGS) IMPLANT
BRIEF MESH DISP 2XL (UNDERPADS AND DIAPERS) IMPLANT
DRAPE LAPAROTOMY T 102X78X121 (DRAPES) IMPLANT
DRAPE SHEET LG 3/4 BI-LAMINATE (DRAPES) IMPLANT
ELECT REM PT RETURN 15FT ADLT (MISCELLANEOUS) ×1 IMPLANT
GAUZE 4X4 16PLY ~~LOC~~+RFID DBL (SPONGE) ×1 IMPLANT
GAUZE PAD ABD 8X10 STRL (GAUZE/BANDAGES/DRESSINGS) IMPLANT
GAUZE SPONGE 4X4 12PLY STRL (GAUZE/BANDAGES/DRESSINGS) IMPLANT
GLOVE BIO SURGEON STRL SZ7.5 (GLOVE) ×1 IMPLANT
GLOVE BIOGEL PI IND STRL 7.0 (GLOVE) ×1 IMPLANT
GOWN STRL REUS W/ TWL LRG LVL3 (GOWN DISPOSABLE) ×1 IMPLANT
GOWN STRL REUS W/ TWL XL LVL3 (GOWN DISPOSABLE) ×1 IMPLANT
GOWN STRL REUS W/TWL LRG LVL3 (GOWN DISPOSABLE) ×1
GOWN STRL REUS W/TWL XL LVL3 (GOWN DISPOSABLE) ×1
KIT BASIN OR (CUSTOM PROCEDURE TRAY) ×1 IMPLANT
KIT TURNOVER KIT A (KITS) IMPLANT
NDL HYPO 25X1 1.5 SAFETY (NEEDLE) ×1 IMPLANT
NDL SAFETY ECLIP 18X1.5 (MISCELLANEOUS) ×1 IMPLANT
NEEDLE HYPO 25X1 1.5 SAFETY (NEEDLE) ×1 IMPLANT
NS IRRIG 1000ML POUR BTL (IV SOLUTION) ×1 IMPLANT
PACK LITHOTOMY IV (CUSTOM PROCEDURE TRAY) ×1 IMPLANT
PENCIL SMOKE EVACUATOR (MISCELLANEOUS) IMPLANT
SPIKE FLUID TRANSFER (MISCELLANEOUS) ×1 IMPLANT
SPONGE SURGIFOAM ABS GEL 12-7 (HEMOSTASIS) ×1 IMPLANT
SPONGE T-LAP 18X18 ~~LOC~~+RFID (SPONGE) IMPLANT
SURGILUBE 2OZ TUBE FLIPTOP (MISCELLANEOUS) ×1 IMPLANT
SUT CHROMIC 2 0 SH (SUTURE) IMPLANT
SUT CHROMIC 3 0 SH 27 (SUTURE) IMPLANT
SYR CONTROL 10ML LL (SYRINGE) ×1 IMPLANT
TOWEL OR 17X26 10 PK STRL BLUE (TOWEL DISPOSABLE) ×1 IMPLANT

## 2022-10-27 NOTE — Anesthesia Postprocedure Evaluation (Signed)
Anesthesia Post Note  Patient: Caitlin Taylor  Procedure(s) Performed: EXAM UNDER ANESTHESIA, I & D PERI RECTAL ABSCESS     Patient location during evaluation: PACU Anesthesia Type: General Level of consciousness: awake and alert Pain management: pain level controlled Vital Signs Assessment: post-procedure vital signs reviewed and stable Respiratory status: spontaneous breathing, nonlabored ventilation, respiratory function stable and patient connected to nasal cannula oxygen Cardiovascular status: blood pressure returned to baseline and stable Postop Assessment: no apparent nausea or vomiting Anesthetic complications: no  No notable events documented.  Last Vitals:  Vitals:   10/27/22 1435 10/27/22 1508  BP: (!) 139/96 (!) 137/93  Pulse: 86 84  Resp: (!) 26 16  Temp:  36.6 C  SpO2: 98% 94%    Last Pain:  Vitals:   10/27/22 1508  TempSrc: Oral  PainSc:                  Trevor Iha

## 2022-10-27 NOTE — Anesthesia Preprocedure Evaluation (Addendum)
Anesthesia Evaluation  Patient identified by MRN, date of birth, ID band Patient awake    Reviewed: Allergy & Precautions, NPO status , Patient's Chart, lab work & pertinent test results, reviewed documented beta blocker date and time   History of Anesthesia Complications Negative for: history of anesthetic complications  Airway Mallampati: II  TM Distance: >3 FB Neck ROM: Full    Dental no notable dental hx.    Pulmonary neg pulmonary ROS, Patient abstained from smoking.   Pulmonary exam normal        Cardiovascular hypertension, Pt. on home beta blockers and Pt. on medications +CHF  Normal cardiovascular exam     Neuro/Psych negative neurological ROS     GI/Hepatic negative GI ROS, Neg liver ROS,,,  Endo/Other    Morbid obesity  Renal/GU Renal Insufficiency and CRFRenal disease (Cr 2.13)  negative genitourinary   Musculoskeletal negative musculoskeletal ROS (+)    Abdominal   Peds  Hematology  (+) Blood dyscrasia (Hgb 10.9), anemia   Anesthesia Other Findings K 2.8 Perirectal abscess  Reproductive/Obstetrics negative OB ROS                             Anesthesia Physical Anesthesia Plan  ASA: 3  Anesthesia Plan: General   Post-op Pain Management: Tylenol PO (pre-op)*   Induction: Intravenous  PONV Risk Score and Plan: 3 and Treatment may vary due to age or medical condition, Midazolam, Dexamethasone, Ondansetron and Scopolamine patch - Pre-op  Airway Management Planned: Oral ETT  Additional Equipment: None  Intra-op Plan:   Post-operative Plan: Extubation in OR  Informed Consent: I have reviewed the patients History and Physical, chart, labs and discussed the procedure including the risks, benefits and alternatives for the proposed anesthesia with the patient or authorized representative who has indicated his/her understanding and acceptance.     Dental advisory  given  Plan Discussed with: CRNA  Anesthesia Plan Comments:        Anesthesia Quick Evaluation

## 2022-10-27 NOTE — Anesthesia Procedure Notes (Addendum)
Procedure Name: Intubation Date/Time: 10/27/2022 12:37 PM  Performed by: Ludwig Lean, CRNAPre-anesthesia Checklist: Patient identified, Emergency Drugs available, Suction available and Patient being monitored Patient Re-evaluated:Patient Re-evaluated prior to induction Oxygen Delivery Method: Circle system utilized Preoxygenation: Pre-oxygenation with 100% oxygen Induction Type: IV induction Ventilation: Mask ventilation without difficulty Laryngoscope Size: Mac and 4 Grade View: Grade II Tube type: Oral Tube size: 7.0 mm Number of attempts: 1 Airway Equipment and Method: Stylet Placement Confirmation: ETT inserted through vocal cords under direct vision, positive ETCO2 and breath sounds checked- equal and bilateral Secured at: 22 cm Tube secured with: Tape Dental Injury: Teeth and Oropharynx as per pre-operative assessment

## 2022-10-27 NOTE — Progress Notes (Signed)
Pharmacy Antibiotic Note  Caitlin Taylor is a 52 y.o. female admitted on 10/26/2022 with  abscess .  Pharmacy has been consulted for Zosyn dosing.  Plan: Zosyn 3.375g IV q8h (4 hour infusion). No dose adjustments anticipated.  Pharmacy will sign off and monitor peripherally via electronic surveillance software for any changes in renal function or micro data.   Height:  (160 cm) Weight: 126.6 kg (279 lb) IBW/kg (Calculated) : 52.4  Temp (24hrs), Avg:98.4 F (36.9 C), Min:98.4 F (36.9 C), Max:98.4 F (36.9 C)  Recent Labs  Lab 10/27/22 0045 10/27/22 0046  WBC 23.6*  --   CREATININE 2.25*  --   LATICACIDVEN  --  1.9    Estimated Creatinine Clearance: 38.3 mL/min (A) (by C-G formula based on SCr of 2.25 mg/dL (H)).    No Known Allergies  Thank you for allowing pharmacy to be a part of this patient's care.  Junita Push PharmD 10/27/2022 4:59 AM

## 2022-10-27 NOTE — Interval H&P Note (Signed)
History and Physical Interval Note:  10/27/2022 11:32 AM  Caitlin Taylor  has presented today for surgery, with the diagnosis of peri rectal abscess.  The various methods of treatment have been discussed with the patient and family. After consideration of risks, benefits and other options for treatment, the patient has consented to  Procedure(s): EXAM UNDER ANESTHESIA, I & D PERI RECTAL ABSCESS (N/A) as a surgical intervention.  The patient's history has been reviewed, patient examined, no change in status, stable for surgery.  I have reviewed the patient's chart and labs.  Questions were answered to the patient's satisfaction.     [name]

## 2022-10-27 NOTE — ED Notes (Signed)
US PIV placed. 

## 2022-10-27 NOTE — Op Note (Signed)
10/26/2022 - 10/27/2022  1:22 PM  PATIENT:  Caitlin Taylor  52 y.o. female  PRE-OPERATIVE DIAGNOSIS:  peri rectal abscess  POST-OPERATIVE DIAGNOSIS:  peri rectal abscess  PROCEDURE:  Procedure(s): EXAM UNDER ANESTHESIA, I & D PERI RECTAL ABSCESS (N/A)  SURGEON:  Surgeon(s) and Role:    * Griselda Miner, MD - Primary  PHYSICIAN ASSISTANT:   ASSISTANTS: none   ANESTHESIA:   local and general  EBL:  30cc   BLOOD ADMINISTERED:none  DRAINS: none   LOCAL MEDICATIONS USED:  MARCAINE     SPECIMEN:  No Specimen  DISPOSITION OF SPECIMEN:  N/A  COUNTS:  YES  TOURNIQUET:  * No tourniquets in log *  DICTATION: .Dragon Dictation  After informed consent was obtained the patient was brought to the operating room and placed in the supine position on the operating table.  After adequate induction of general anesthesia the patient was moved into a lithotomy position and all pressure points were padded.  The perirectal area was then prepped with Betadine and draped in usual sterile manner.  An appropriate timeout was performed.  The left perirectal space had a large area of fluctuance.  This area was infiltrated with quarter percent Marcaine.  A vertically oriented left perirectal space incision was made with the electrocautery.  A large abscess cavity was entered.  A large amount of pus was evacuated.  Cultures were obtained.  The cavity was fairly large and deep.  Some necrotic tissue was debrided sharply with the electrocautery.  Hemostasis was achieved using the Bovie electrocautery.  The wound once it was cleaned and dried was packed with an entire Kerlix gauze.  Sterile dressings were then applied.  The patient tolerated the procedure well.  At the end of the case all needle sponge and instrument counts were correct.  At this point the tissue planes seem to be intact and no further tracking was noted.  The patient was then awakened and taken to recovery in stable condition.  PLAN OF  CARE: Admit to inpatient   PATIENT DISPOSITION:  PACU - hemodynamically stable.   Delay start of Pharmacological VTE agent (>24hrs) due to surgical blood loss or risk of bleeding: no

## 2022-10-27 NOTE — Transfer of Care (Signed)
Immediate Anesthesia Transfer of Care Note  Patient: Caitlin Taylor  Procedure(s) Performed: Procedure(s): EXAM UNDER ANESTHESIA, I & D PERI RECTAL ABSCESS (N/A)  Patient Location: PACU  Anesthesia Type:General  Level of Consciousness: Patient easily awoken, sedated, comfortable, cooperative, following commands, responds to stimulation.   Airway & Oxygen Therapy: Patient spontaneously breathing, ventilating well, oxygen via simple oxygen mask.  Post-op Assessment: Report given to PACU RN, vital signs reviewed and stable, moving all extremities.   Post vital signs: Reviewed and stable.  Complications: No apparent anesthesia complications Last Vitals:  Vitals Value Taken Time  BP 103/78 10/27/22 1342  Temp    Pulse 103 10/27/22 1345  Resp 23 10/27/22 1345  SpO2 99 % 10/27/22 1345  Vitals shown include unvalidated device data.  Last Pain:  Vitals:   10/27/22 1119  TempSrc:   PainSc: 9       Patients Stated Pain Goal: 4 (10/27/22 1119)  Complications: No notable events documented.

## 2022-10-27 NOTE — Progress Notes (Signed)
Patient unable to provide urine for uCG. Patient states that she is absolutely certain that she is not pregnant. Dr Stephannie Peters aware.   Lisbeth Ply, RN

## 2022-10-27 NOTE — ED Provider Notes (Signed)
[facility] Provider Note  CSN: [id number] Arrival date & time: 10/26/22 2259  Chief Complaint(s) Wound Infection  HPI Caitlin Taylor is a 52 y.o. female with a past medical history listed below who presents to the emergency department with left buttock pain and redness.  Ongoing for 1 week but worse over the past couple of days.  Pain worse with sitting and applying pressure.  Patient denies any known fevers or chills.  Still having bowel movements.  No diarrhea.  Patient reports prior history of abscesses and feels this is similar.  States she feels this might be draining already.  The history is provided by the patient.    Past Medical History Past Medical History:  Diagnosis Date   Chronic back pain    CKD (chronic kidney disease) stage 2, GFR 60-89 ml/min    DVT (deep venous thrombosis)    Hypertension    Lumbar stenosis    Morbid obesity    Pulmonary embolism    a. diagnosed in 08/2016. Started on Eliquis   Patient Active Problem List   Diagnosis Date Noted   Perirectal abscess 10/27/2022   Acute renal failure superimposed on stage 3 chronic kidney disease    Abscess 08/28/2018   Cellulitis of left groin 08/27/2018   Right leg DVT 09/30/2016   Chronic diastolic CHF (congestive heart failure) 09/22/2016   Edema 09/04/2016   History of pulmonary embolus (PE) 09/03/2016   Benign essential HTN    CKD (chronic kidney disease), stage III (HCC)    Lumbar stenosis    S/P lumbar fusion    Normocytic anemia    Constipation due to pain medication    Morbid obesity due to excess calories    Essential hypertension 01/05/2016   Home Medication(s) Prior to Admission medications   Medication Sig Start Date End Date Taking? Authorizing Provider  allopurinol (ZYLOPRIM) 100 MG tablet Take 200 mg by mouth daily. 08/19/18  Yes [provider]  metoprolol tartrate (LOPRESSOR) 25 MG tablet Take 0.5 tablets (12.5 mg total) by mouth 2 (two) times  daily. Patient taking differently: Take 25 mg by mouth daily. 09/06/16  Yes Maxie Barb, MD  MITIGARE 0.6 MG CAPS Take 0.12 mg by mouth daily. 08/25/18  Yes [provider]  norethindrone (MICRONOR,CAMILA,ERRIN) 0.35 MG tablet Take 1 tablet by mouth at bedtime. 08/10/18  Yes [provider]  torsemide (DEMADEX) 100 MG tablet Take 100 mg by mouth daily.   Yes [provider]  acetaminophen (TYLENOL) 325 MG tablet Take 2 tablets (650 mg total) by mouth every 6 (six) hours as needed for moderate pain. Patient not taking: Reported on 10/27/2022 09/11/18   Rai, Delene Ruffini, MD  doxycycline (VIBRAMYCIN) 100 MG capsule Take 1 capsule (100 mg total) by mouth 2 (two) times daily. Patient not taking: Reported on 10/27/2022 06/25/19   Horton, Mayer Masker, MD  HYDROcodone-acetaminophen (NORCO/VICODIN) 5-325 MG tablet Take 1-2 tablets by mouth every 6 (six) hours as needed for severe pain. Patient not taking: Reported on 10/27/2022 11/05/19   Terrilee Files, MD  ondansetron (ZOFRAN ODT) 4 MG disintegrating tablet Take 1 tablet (4 mg total) by mouth every 8 (eight) hours as needed for nausea or vomiting. Patient not taking: Reported on 10/27/2022 03/24/21   Haskel Schroeder, PA-C  polyethylene glycol [facility] / GLYCOLAX) packet Take 17 g by mouth daily as needed for mild constipation. Patient not taking: Reported on 10/27/2022 09/30/16   Calvert Cantor, MD  potassium  chloride (KLOR-CON) 10 MEQ tablet Take 1 tablet (10 mEq total) by mouth daily. Patient not taking: Reported on 10/27/2022 03/24/21   Haskel Schroeder, PA-C  predniSONE (STERAPRED UNI-PAK 21 TAB) 10 MG (21) TBPK tablet Take by mouth daily. Take 6 tabs by mouth daily  for 2 days, then 5 tabs for 2 days, then 4 tabs for 2 days, then 3 tabs for 2 days, 2 tabs for 2 days, then 1 tab by mouth daily for 2 days Patient not taking: Reported on 10/27/2022 11/05/19   Terrilee Files, MD  spironolactone (ALDACTONE) 25 MG tablet  Take 1 tablet (25 mg total) by mouth daily. Patient not taking: Reported on 10/27/2022 09/12/18   Cathren Harsh, MD                                                                                                                                    Allergies Patient has no known allergies.  Review of Systems Review of Systems As noted in HPI  Physical Exam Vital Signs  I have reviewed the triage vital signs BP (!) 137/93 (BP Location: Right Arm)   Pulse 84   Temp 97.8 F (36.6 C) (Oral)   Resp 16   Ht 5\' 3"  (1.6 m)   Wt 126.6 kg   LMP 09/24/2022 Comment: Patient declines uCG  SpO2 94%   BMI 49.42 kg/m   Physical Exam Vitals reviewed.  Constitutional:      General: She is not in acute distress.    Appearance: She is well-developed. She is not diaphoretic.  HENT:     Head: Normocephalic and atraumatic.     Right Ear: External ear normal.     Left Ear: External ear normal.     Nose: Nose normal.  Eyes:     General: No scleral icterus.    Conjunctiva/sclera: Conjunctivae normal.  Neck:     Trachea: Phonation normal.  Cardiovascular:     Rate and Rhythm: Regular rhythm. Tachycardia present.  Pulmonary:     Effort: Pulmonary effort is normal. No respiratory distress.     Breath sounds: No stridor.  Abdominal:     General: There is no distension.  Musculoskeletal:        General: Normal range of motion.     Cervical back: Normal range of motion.  Skin:    Findings: Erythema (with underlying induration. extremely TTP. no obvious drainage noted.) present.       Neurological:     Mental Status: She is alert and oriented to person, place, and time.  Psychiatric:        Behavior: Behavior normal.     ED Results and Treatments Labs (all labs ordered are listed, but only abnormal results are displayed) Labs Reviewed  BASIC METABOLIC PANEL - Abnormal; Notable for the following components:      Result Value   Potassium 3.0 (*)    BUN  31 (*)    Creatinine, Ser 2.25  (*)    Calcium 7.9 (*)    GFR, Estimated 26 (*)    All other components within normal limits  CBC WITH DIFFERENTIAL/PLATELET - Abnormal; Notable for the following components:   WBC 23.6 (*)    RBC 3.82 (*)    Hemoglobin 11.5 (*)    HCT 34.4 (*)    Neutro Abs 20.3 (*)    Monocytes Absolute 1.2 (*)    Abs Immature Granulocytes 0.50 (*)    All other components within normal limits  CBC - Abnormal; Notable for the following components:   WBC 22.8 (*)    RBC 3.66 (*)    Hemoglobin 10.9 (*)    HCT 32.7 (*)    All other components within normal limits  BASIC METABOLIC PANEL - Abnormal; Notable for the following components:   Potassium 2.8 (*)    Glucose, Bld 108 (*)    BUN 29 (*)    Creatinine, Ser 2.13 (*)    Calcium 7.8 (*)    GFR, Estimated 28 (*)    All other components within normal limits  AEROBIC/ANAEROBIC CULTURE W GRAM STAIN (SURGICAL/DEEP WOUND)  LACTIC ACID, PLASMA  LACTIC ACID, PLASMA  I-STAT CHEM 8, ED                                                                                                                         EKG  EKG Interpretation  Date/Time:  Tuesday October 26 2022 23:12:24 EDT Ventricular Rate:  106 PR Interval:  131 QRS Duration: 91 QT Interval:  342 QTC Calculation: 455 R Axis:   55 Text Interpretation: Sinus tachycardia Borderline repolarization abnormality No significant change since last tracing Confirmed by Drema Pry [phone]) on 10/27/2022 2:00:02 AM       Radiology CT ABDOMEN PELVIS WO CONTRAST  Result Date: 10/27/2022 CLINICAL DATA:  Buttock abscess, leukocytosis EXAM: CT ABDOMEN AND PELVIS WITHOUT CONTRAST TECHNIQUE: Multidetector CT imaging of the abdomen and pelvis was performed following the standard protocol without IV contrast. RADIATION DOSE REDUCTION: This exam was performed according to the departmental dose-optimization program which includes automated exposure control, adjustment of the mA and/or kV according to patient size  and/or use of iterative reconstruction technique. COMPARISON:  08/27/2018 FINDINGS: Lower chest: Mild subpleural scarring in the right lower lobe. Hepatobiliary: Unenhanced liver is unremarkable. Gallbladder is unremarkable. No intrahepatic or extrahepatic duct dilatation. Pancreas: Within normal limits. Spleen: Within normal limits. Adrenals/Urinary Tract: 2.7 cm left adrenal nodule, measuring 35 HUs and therefore indeterminate, but unchanged from 2020 (when remeasured), and therefore compatible with a benign adrenal adenoma. Right adrenal gland is within normal limits. Kidneys are within normal limits. No renal calculi or hydronephrosis. Bladder is within normal limits. Stomach/Bowel: Stomach is within normal limits. No evidence of bowel obstruction. Normal appendix (series 2/image 44). No colonic wall thickening or inflammatory changes. Vascular/Lymphatic: No evidence of abdominal aortic aneurysm. No suspicious abdominopelvic lymphadenopathy. Reproductive: Uterine fibroids. Bilateral  ovaries are within normal limits. Other: No abdominopelvic ascites. Musculoskeletal: Mild soft tissue gas in the left medial thigh/perineal region (series 3/image 7) with mild subcutaneous thickening/stranding in the left gluteal region with underlying stranding (series 3/image 14), suggesting cellulitis, without drainable fluid collection/abscess. Status post PLIF at L4-5. Mild degenerative changes of the lower thoracic spine. IMPRESSION: Left gluteal cellulitis, without drainable fluid collection/abscess. Mild soft tissue gas in the left medial thigh/perineal region suggests recent intervention. If the patient has not had intervention, this raises concern for early gangrenous infection. Additional ancillary findings as above. Electronically Signed   By: Charline Bills M.D.   On: 10/27/2022 02:11    Medications Ordered in ED Medications  sodium chloride 0.9 % bolus 1,000 mL (0 mLs Intravenous Stopped 10/27/22 0301)    And   0.9 %  sodium chloride infusion ( Intravenous Rate/Dose Change 10/27/22 1710)  acetaminophen (TYLENOL) tablet 650 mg ( Oral MAR Unhold 10/27/22 1440)    Or  acetaminophen (TYLENOL) suppository 650 mg ( Rectal MAR Unhold 10/27/22 1440)  ondansetron (ZOFRAN-ODT) disintegrating tablet 4 mg ( Oral MAR Unhold 10/27/22 1440)    Or  ondansetron (ZOFRAN) injection 4 mg ( Intravenous MAR Unhold 10/27/22 1440)  allopurinol (ZYLOPRIM) tablet 200 mg ( Oral MAR Unhold 10/27/22 1440)  metoprolol tartrate (LOPRESSOR) tablet 25 mg ( Oral MAR Unhold 10/27/22 1440)  torsemide (DEMADEX) tablet 100 mg ( Oral MAR Unhold 10/27/22 1440)  piperacillin-tazobactam (ZOSYN) IVPB 3.375 g (3.375 g Intravenous New Bag/Given 10/27/22 1713)  docusate sodium (COLACE) capsule 100 mg (100 mg Oral Given 10/27/22 1710)  polyethylene glycol (MIRALAX / GLYCOLAX) packet 17 g (has no administration in time range)  enoxaparin (LOVENOX) injection 40 mg (has no administration in time range)  HYDROmorphone (DILAUDID) injection 0.5-1 mg (has no administration in time range)  oxyCODONE (Oxy IR/ROXICODONE) immediate release tablet 5-10 mg (5 mg Oral Given 10/27/22 1710)  methocarbamol (ROBAXIN) 1,000 mg in dextrose 5 % 100 mL IVPB (has no administration in time range)  vancomycin (VANCOCIN) IVPB 1000 mg/200 mL premix (0 mg Intravenous Stopped 10/27/22 0603)  piperacillin-tazobactam (ZOSYN) IVPB 3.375 g (0 g Intravenous Stopped 10/27/22 0406)  clindamycin (CLEOCIN) IVPB 600 mg (0 mg Intravenous Stopped 10/27/22 0448)  potassium chloride 10 mEq in 100 mL IVPB (0 mEq Intravenous Stopped 10/27/22 0916)  acetaminophen (TYLENOL) tablet 1,000 mg (1,000 mg Oral Given 10/27/22 1124)  chlorhexidine (PERIDEX) 0.12 % solution 15 mL (15 mLs Mouth/Throat Given 10/27/22 1139)    Or  Oral care mouth rinse ( Mouth Rinse See Alternative 10/27/22 1139)  fentaNYL (SUBLIMAZE) 50 MCG/ML injection (  Duplicate 10/27/22 1405)                                                                                                                                      Procedures Procedures  (including critical care time)  Medical Decision Making / ED Course  Click here for  ABCD2, HEART and other calculators  Medical Decision Making Amount and/or Complexity of Data Reviewed Labs: ordered. Decision-making details documented in ED Course. Radiology: ordered and independent interpretation performed. Decision-making details documented in ED Course. ECG/medicine tests: ordered and independent interpretation performed. Decision-making details documented in ED Course.  Risk Prescription drug management. Decision regarding hospitalization.    This patient presents to the ED for concern of left buttock pain, this involves an extensive number of treatment options, and is a complaint that carries with it a high risk of complications and morbidity. The differential diagnosis includes but not limited to cellulitis, abscess, necrotic soft tissue infection.   Work up: [employer] and/or imaging independently interpreted by me) CBC with leukocytosis.  Mild anemia Metabolic panel with hypokalemia and baseline renal insufficiency Lactic acid normal CT scan without contrast due to renal function ordered and notable for left buttock soft tissue infection without obvious drainable abscess.  There are areas of subcutaneous gas.  This is concerning for either draining abscess or necrotizing infection.  Reassessment: Patient started on triple antibiotic therapy for possible necrotizing infection. Consulted general surgery and spoke with Dr. Dwain Sarna who plans to evaluate the patient and admit for further management.        Final Clinical Impression(s) / ED Diagnoses Final diagnoses:  Perirectal abscess           This chart was dictated using voice recognition software.  Despite best efforts to proofread,  errors can occur which can change the documentation meaning.    Nira Conn, MD 10/27/22 [phone]

## 2022-10-27 NOTE — H&P (Signed)
Caitlin Taylor is an 52 y.o. female.   Chief Complaint: buttock pain HPI: 41 yof with multiple medical issues including prior PE (not on AC now) who presents with one week of buttock pain that has been worsening. Has had abscesses before.  Pain got worse and area got larger over the week. Has been draining some. She continues to have bms.  She presented to er and was found to have elevated wbc as well as ct that shows an abscess. I was asked to see her.   Past Medical History:  Diagnosis Date   Chronic back pain    CKD (chronic kidney disease) stage 2, GFR 60-89 ml/min    DVT (deep venous thrombosis)    Hypertension    Lumbar stenosis    Morbid obesity    Pulmonary embolism    a. diagnosed in 08/2016. Started on Eliquis    Past Surgical History:  Procedure Laterality Date   ABDOMINAL SURGERY     CARPAL TUNNEL RELEASE     CESAREAN SECTION     INCISION AND DRAINAGE ABSCESS Left 09/08/2018   Procedure: INCISION AND DRAINAGE LEFT GROIN ABSCESS;  Surgeon: Romie Levee, MD;  Location: [facility];  Service: General;  Laterality: Left;   IRRIGATION AND DEBRIDEMENT ABSCESS N/A 09/27/2016   Procedure: IRRIGATION AND DEBRIDEMENT OF ABDOMINAL WALL ABSCESS;  Surgeon: Gaynelle Adu, MD;  Location: [facility];  Service: General;  Laterality: N/A;   IRRIGATION AND DEBRIDEMENT ABSCESS Left 08/31/2018   Procedure: IRRIGATION AND DEBRIDEMENT ABSCESS THIGH/GROIN LEFT;  Surgeon: Abigail Miyamoto, MD;  Location: [facility];  Service: General;  Laterality: Left;   MAXIMUM ACCESS (MAS)POSTERIOR LUMBAR INTERBODY FUSION (PLIF) 1 LEVEL N/A 01/06/2016   Procedure: FOR MAXIMUM ACCESS (MAS) POSTERIOR LUMBAR INTERBODY FUSION (PLIF) LUMBAR FOUR-FIVE;  Surgeon: Loura Halt Ditty, MD;  Location: [facility];  Service: Neurosurgery;  Laterality: N/A;    Family History  Problem Relation Age of Onset   Congestive Heart Failure Father    Social History:  reports that she has never smoked. She has never used smokeless tobacco.  She reports that she does not currently use alcohol. She reports that she does not use drugs.  Allergies: No Known Allergies  (Not in a hospital admission)   Results for orders placed or performed during the hospital encounter of 10/26/22 (from the past 48 hour(s))  Basic metabolic panel     Status: Abnormal   Collection Time: 10/27/22 12:45 AM  Result Value Ref Range   Sodium 140 135 - 145 mmol/L   Potassium 3.0 (L) 3.5 - 5.1 mmol/L   Chloride 103 98 - 111 mmol/L   CO2 25 22 - 32 mmol/L   Glucose, Bld 98 70 - 99 mg/dL    Comment: Glucose reference range applies only to samples taken after fasting for at least 8 hours.   BUN 31 (H) 6 - 20 mg/dL   Creatinine, Ser 1.61 (H) 0.44 - 1.00 mg/dL   Calcium 7.9 (L) 8.9 - 10.3 mg/dL   GFR, Estimated 26 (L) >60 mL/min    Comment: (NOTE) Calculated using the CKD-EPI Creatinine Equation (2021)    Anion gap 12 5 - 15    Comment: Performed at [facility], [address] [place], [place] [address]  CBC with Differential/Platelet     Status: Abnormal   Collection Time: 10/27/22 12:45 AM  Result Value Ref Range   WBC 23.6 (H) 4.0 - 10.5 K/uL   RBC 3.82 (L) 3.87 - 5.11  MIL/uL   Hemoglobin 11.5 (L) 12.0 - 15.0 g/dL   HCT 53.6 (L) 64.4 - 03.4 %   MCV 90.1 80.0 - 100.0 fL   MCH 30.1 26.0 - 34.0 pg   MCHC 33.4 30.0 - 36.0 g/dL   RDW 74.2 59.5 - 63.8 %   Platelets 218 150 - 400 K/uL   nRBC 0.0 0.0 - 0.2 %   Neutrophils Relative % 80 %   Neutro Abs 20.3 (H) 1.7 - 7.7 K/uL   Band Neutrophils 6 %   Lymphocytes Relative 7 %   Lymphs Abs 1.7 0.7 - 4.0 K/uL   Monocytes Relative 5 %   Monocytes Absolute 1.2 (H) 0.1 - 1.0 K/uL   Eosinophils Relative 0 %   Eosinophils Absolute 0.0 0.0 - 0.5 K/uL   Basophils Relative 0 %   Basophils Absolute 0.0 0.0 - 0.1 K/uL   Metamyelocytes Relative 2 %   Abs Immature Granulocytes 0.50 (H) 0.00 - 0.07 K/uL    Comment: Performed at [facility], [address],  [place], [place] [address]  Lactic acid, plasma     Status: None   Collection Time: 10/27/22 12:46 AM  Result Value Ref Range   Lactic Acid, Venous 1.9 0.5 - 1.9 mmol/L    Comment: Performed at [facility], [address], [place], [place] [address]   CT ABDOMEN PELVIS WO CONTRAST  Result Date: 10/27/2022 CLINICAL DATA:  Buttock abscess, leukocytosis EXAM: CT ABDOMEN AND PELVIS WITHOUT CONTRAST TECHNIQUE: Multidetector CT imaging of the abdomen and pelvis was performed following the standard protocol without IV contrast. RADIATION DOSE REDUCTION: This exam was performed according to the departmental dose-optimization program which includes automated exposure control, adjustment of the mA and/or kV according to patient size and/or use of iterative reconstruction technique. COMPARISON:  08/27/2018 FINDINGS: Lower chest: Mild subpleural scarring in the right lower lobe. Hepatobiliary: Unenhanced liver is unremarkable. Gallbladder is unremarkable. No intrahepatic or extrahepatic duct dilatation. Pancreas: Within normal limits. Spleen: Within normal limits. Adrenals/Urinary Tract: 2.7 cm left adrenal nodule, measuring 35 HUs and therefore indeterminate, but unchanged from 2020 (when remeasured), and therefore compatible with a benign adrenal adenoma. Right adrenal gland is within normal limits. Kidneys are within normal limits. No renal calculi or hydronephrosis. Bladder is within normal limits. Stomach/Bowel: Stomach is within normal limits. No evidence of bowel obstruction. Normal appendix (series 2/image 44). No colonic wall thickening or inflammatory changes. Vascular/Lymphatic: No evidence of abdominal aortic aneurysm. No suspicious abdominopelvic lymphadenopathy. Reproductive: Uterine fibroids. Bilateral ovaries are within normal limits. Other: No abdominopelvic ascites. Musculoskeletal: Mild soft tissue gas in the left medial thigh/perineal region (series 3/image 7) with mild subcutaneous  thickening/stranding in the left gluteal region with underlying stranding (series 3/image 14), suggesting cellulitis, without drainable fluid collection/abscess. Status post PLIF at L4-5. Mild degenerative changes of the lower thoracic spine. IMPRESSION: Left gluteal cellulitis, without drainable fluid collection/abscess. Mild soft tissue gas in the left medial thigh/perineal region suggests recent intervention. If the patient has not had intervention, this raises concern for early gangrenous infection. Additional ancillary findings as above. Electronically Signed   By: Charline Bills M.D.   On: 10/27/2022 02:11    Review of Systems  Constitutional:  Negative for fever.  Gastrointestinal:  Positive for rectal pain. Negative for abdominal pain, blood in stool, constipation, nausea and vomiting.  All other systems reviewed and are negative.   Blood pressure 138/84, pulse (!) 110, temperature 98.4 F (36.9 C), temperature source Oral,  resp. rate (!) 28, height  (1.6 m), weight 126.6 kg, SpO2 97 %. Physical Exam Constitutional:      Appearance: Normal appearance. She is obese.  Eyes:     General: No scleral icterus. Cardiovascular:     Rate and Rhythm: Tachycardia present.  Pulmonary:     Effort: Pulmonary effort is normal.  Abdominal:     Palpations: Abdomen is soft.  Genitourinary:    Comments: Left buttock with erythema, tender, fluctuant c/w abscess  Musculoskeletal:     Right lower leg: Edema present.     Left lower leg: Edema present.  Skin:    General: Skin is warm and dry.     Capillary Refill: Capillary refill takes less than 2 seconds.  Neurological:     General: No focal deficit present.     Mental Status: She is alert.  Psychiatric:        Mood and Affect: Mood normal.        Behavior: Behavior normal.      Assessment/Plan Perirectal abscess -I do not think she has NSTI as ER was concerned about -admission, IV abx (will just do zosyn), npo, will need to go to  OR today for I and D of this abscess and remain at least overnight due to cellulitis for abx -will start lovenox now due to PE history -follow labs (Cr is elevated baseline)   Emelia Loron, MD 10/27/2022, 4:47 AM

## 2022-10-28 LAB — AEROBIC/ANAEROBIC CULTURE W GRAM STAIN (SURGICAL/DEEP WOUND)

## 2022-10-28 LAB — CBC
HCT: 27.6 % — ABNORMAL LOW (ref 36.0–46.0)
Hemoglobin: 9.1 g/dL — ABNORMAL LOW (ref 12.0–15.0)
MCH: 29.8 pg (ref 26.0–34.0)
MCHC: 33 g/dL (ref 30.0–36.0)
MCV: 90.5 fL (ref 80.0–100.0)
Platelets: 195 10*3/uL (ref 150–400)
RBC: 3.05 MIL/uL — ABNORMAL LOW (ref 3.87–5.11)
RDW: 15.1 % (ref 11.5–15.5)
WBC: 25.8 10*3/uL — ABNORMAL HIGH (ref 4.0–10.5)
nRBC: 0 % (ref 0.0–0.2)

## 2022-10-28 LAB — BASIC METABOLIC PANEL
Anion gap: 9 (ref 5–15)
BUN: 30 mg/dL — ABNORMAL HIGH (ref 6–20)
CO2: 19 mmol/L — ABNORMAL LOW (ref 22–32)
Calcium: 6.6 mg/dL — ABNORMAL LOW (ref 8.9–10.3)
Chloride: 112 mmol/L — ABNORMAL HIGH (ref 98–111)
Creatinine, Ser: 1.92 mg/dL — ABNORMAL HIGH (ref 0.44–1.00)
GFR, Estimated: 31 mL/min — ABNORMAL LOW (ref >60)
Glucose, Bld: 149 mg/dL — ABNORMAL HIGH (ref 70–99)
Potassium: 3.1 mmol/L — ABNORMAL LOW (ref 3.5–5.1)
Sodium: 140 mmol/L (ref 135–145)

## 2022-10-28 LAB — HEMOGLOBIN A1C
Hgb A1c MFr Bld: 5.6 % (ref 4.8–5.6)
Mean Plasma Glucose: 114.02 mg/dL

## 2022-10-28 LAB — MAGNESIUM: Magnesium: 1.7 mg/dL (ref 1.7–2.4)

## 2022-10-28 MED ORDER — HYDROMORPHONE HCL 1 MG/ML IJ SOLN: 0.5000 mg | INTRAMUSCULAR | Status: DC | PRN

## 2022-10-28 MED ORDER — POTASSIUM CHLORIDE CRYS ER 20 MEQ PO TBCR
40.0000 meq | EXTENDED_RELEASE_TABLET | Freq: Two times a day (BID) | ORAL | Status: AC
  Administered 2022-10-28 (×2): 40 meq via ORAL
  Filled 2022-10-28 (×2): qty 2

## 2022-10-28 MED ORDER — ALUM & MAG HYDROXIDE-SIMETH 200-200-20 MG/5ML PO SUSP
30.0000 mL | Freq: Four times a day (QID) | ORAL | Status: DC | PRN
  Administered 2022-10-28: 30 mL via ORAL
  Filled 2022-10-28: qty 30

## 2022-10-28 MED ORDER — MAGNESIUM SULFATE 2 GM/50ML IV SOLN
2.0000 g | Freq: Once | INTRAVENOUS | Status: AC
  Administered 2022-10-28: 2 g via INTRAVENOUS
  Filled 2022-10-28: qty 50

## 2022-10-28 MED ORDER — METHOCARBAMOL 500 MG PO TABS: 750.0000 mg | ORAL_TABLET | Freq: Four times a day (QID) | ORAL | Status: DC | PRN

## 2022-10-28 MED ORDER — ACETAMINOPHEN 500 MG PO TABS
1000.0000 mg | ORAL_TABLET | Freq: Four times a day (QID) | ORAL | Status: DC
  Administered 2022-10-28 – 2022-11-04 (×24): 1000 mg via ORAL
  Filled 2022-10-28 (×26): qty 2

## 2022-10-28 NOTE — H&P (View-Only) (Signed)
1 Day Post-Op  Subjective: CC: Pain over left buttock well controlled with medications. No other areas of pain. Tolerating diet without n/v. Oob to bedside commode. Voiding without issues. No BM.   Afebrile. No tachycardia or hypotension. WBC pending.   Objective: Vital signs in last 24 hours: Temp:  [97.7 F (36.5 C)-99.2 F (37.3 C)] 97.9 F (36.6 C) (04/18 0620) Pulse Rate:  [65-106] 65 (04/18 0620) Resp:  [16-35] 18 (04/18 0620) BP: (120-151)/(73-96) 127/87 (04/18 0620) SpO2:  [90 %-99 %] 96 % (04/18 0620) Weight:  [126.6 kg] 126.6 kg (04/17 1119) Last BM Date : 10/27/22  Intake/Output from previous day: 04/17 0701 - 04/18 0700 In: 1042.5 [P.O.:220; I.V.:537; IV Piggyback:285.5] Out: 10 [Blood:10] Intake/Output this shift: Total I/O In: -  Out: 200 [Urine:200]  PE: Gen:  Alert, NAD, pleasant Card:  RRR Pulm:  CTAB, no W/R/R, effort normal Abd: Soft, ND, NT  Psych: A&Ox3  GU: Left perirectal I&D packing in place. There is surrounding erythema and induration noted extending up the posterior left buttock. No fluctuance or drainage.   Lab Results:  Recent Labs    10/27/22 0045 10/27/22 0500  WBC 23.6* 22.8*  HGB 11.5* 10.9*  HCT 34.4* 32.7*  PLT 218 217   BMET Recent Labs    10/27/22 0045 10/27/22 0500  NA 140 142  K 3.0* 2.8*  CL 103 107  CO2 25 24  GLUCOSE 98 108*  BUN 31* 29*  CREATININE 2.25* 2.13*  CALCIUM 7.9* 7.8*   PT/INR No results for input(s): "LABPROT", "INR" in the last 72 hours. CMP     Component Value Date/Time   NA 142 10/27/2022 0500   K 2.8 (L) 10/27/2022 0500   CL 107 10/27/2022 0500   CO2 24 10/27/2022 0500   GLUCOSE 108 (H) 10/27/2022 0500   BUN 29 (H) 10/27/2022 0500   CREATININE 2.13 (H) 10/27/2022 0500   CALCIUM 7.8 (L) 10/27/2022 0500   PROT 7.5 03/24/2021 0858   ALBUMIN 4.5 03/24/2021 0858   AST 87 (H) 03/24/2021 0858   ALT 100 (H) 03/24/2021 0858   ALKPHOS 97 03/24/2021 0858   BILITOT 1.2 03/24/2021 0858    GFRNONAA 28 (L) 10/27/2022 0500   GFRAA 15 (L) 11/05/2019 1500   Lipase     Component Value Date/Time   LIPASE 48 03/24/2021 0858    Studies/Results: CT ABDOMEN PELVIS WO CONTRAST  Result Date: 10/27/2022 CLINICAL DATA:  Buttock abscess, leukocytosis EXAM: CT ABDOMEN AND PELVIS WITHOUT CONTRAST TECHNIQUE: Multidetector CT imaging of the abdomen and pelvis was performed following the standard protocol without IV contrast. RADIATION DOSE REDUCTION: This exam was performed according to the departmental dose-optimization program which includes automated exposure control, adjustment of the mA and/or kV according to patient size and/or use of iterative reconstruction technique. COMPARISON:  08/27/2018 FINDINGS: Lower chest: Mild subpleural scarring in the right lower lobe. Hepatobiliary: Unenhanced liver is unremarkable. Gallbladder is unremarkable. No intrahepatic or extrahepatic duct dilatation. Pancreas: Within normal limits. Spleen: Within normal limits. Adrenals/Urinary Tract: 2.7 cm left adrenal nodule, measuring 35 HUs and therefore indeterminate, but unchanged from 2020 (when remeasured), and therefore compatible with a benign adrenal adenoma. Right adrenal gland is within normal limits. Kidneys are within normal limits. No renal calculi or hydronephrosis. Bladder is within normal limits. Stomach/Bowel: Stomach is within normal limits. No evidence of bowel obstruction. Normal appendix (series 2/image 44). No colonic wall thickening or inflammatory changes. Vascular/Lymphatic: No evidence of abdominal aortic aneurysm. No suspicious abdominopelvic  lymphadenopathy. Reproductive: Uterine fibroids. Bilateral ovaries are within normal limits. Other: No abdominopelvic ascites. Musculoskeletal: Mild soft tissue gas in the left medial thigh/perineal region (series 3/image 7) with mild subcutaneous thickening/stranding in the left gluteal region with underlying stranding (series 3/image 14), suggesting  cellulitis, without drainable fluid collection/abscess. Status post PLIF at L4-5. Mild degenerative changes of the lower thoracic spine. IMPRESSION: Left gluteal cellulitis, without drainable fluid collection/abscess. Mild soft tissue gas in the left medial thigh/perineal region suggests recent intervention. If the patient has not had intervention, this raises concern for early gangrenous infection. Additional ancillary findings as above. Electronically Signed   By: Charline Bills M.D.   On: 10/27/2022 02:11    Anti-infectives: Anti-infectives (From admission, onward)    Start     Dose/Rate Route Frequency Ordered Stop   10/27/22 1000  piperacillin-tazobactam (ZOSYN) IVPB 3.375 g        3.375 g 12.5 mL/hr over 240 Minutes Intravenous Every 8 hours 10/27/22 0458     10/27/22 0300  vancomycin (VANCOCIN) IVPB 1000 mg/200 mL premix        1,000 mg 200 mL/hr over 60 Minutes Intravenous  Once 10/27/22 0256 10/27/22 0603   10/27/22 0300  piperacillin-tazobactam (ZOSYN) IVPB 3.375 g        3.375 g 100 mL/hr over 30 Minutes Intravenous  Once 10/27/22 0256 10/27/22 0406   10/27/22 0300  clindamycin (CLEOCIN) IVPB 600 mg        600 mg 100 mL/hr over 30 Minutes Intravenous  Once 10/27/22 0256 10/27/22 0448        Assessment/Plan POD 1 s/p EUA, I&D of perirectal abscess by Dr. Carolynne Edouard on 4/17 - Plan first dressing change and relook tomorrow for possible further debridement in OR tomorrow.  - Cont abx - Cx pending. Gram stain with gram pos cocci and gram neg rods - Mobilize - Pulm toilet - Labs pending (CBC, BMP, Mg, A1c)  FEN - Reg, NPO at midnight. IVF at 137ml/hr (reassess after labs) VTE - SCDs, Lovenox ID - Zosyn Foley - None, voiding  Hx CKD2 per notes - Cr pending this am Hypokalemia - labs pending Hx HTN - home meds Hx PE - Remote, not on anticoagulation   LOS: 1 day    Jacinto Halim , [facility] 10/28/2022, 8:17 AM Please see Amion for pager number  during day hours 7:00am-4:30pm

## 2022-10-28 NOTE — Progress Notes (Signed)
1 Day Post-Op  Subjective: CC: Pain over left buttock well controlled with medications. No other areas of pain. Tolerating diet without n/v. Oob to bedside commode. Voiding without issues. No BM.   Afebrile. No tachycardia or hypotension. WBC pending.   Objective: Vital signs in last 24 hours: Temp:  [97.7 F (36.5 C)-99.2 F (37.3 C)] 97.9 F (36.6 C) (04/18 0620) Pulse Rate:  [65-106] 65 (04/18 0620) Resp:  [16-35] 18 (04/18 0620) BP: (120-151)/(73-96) 127/87 (04/18 0620) SpO2:  [90 %-99 %] 96 % (04/18 0620) Weight:  [126.6 kg] 126.6 kg (04/17 1119) Last BM Date : 10/27/22  Intake/Output from previous day: 04/17 0701 - 04/18 0700 In: 1042.5 [P.O.:220; I.V.:537; IV Piggyback:285.5] Out: 10 [Blood:10] Intake/Output this shift: Total I/O In: -  Out: 200 [Urine:200]  PE: Gen:  Alert, NAD, pleasant Card:  RRR Pulm:  CTAB, no W/R/R, effort normal Abd: Soft, ND, NT  Psych: A&Ox3  GU: Left perirectal I&D packing in place. There is surrounding erythema and induration noted extending up the posterior left buttock. No fluctuance or drainage.   Lab Results:  Recent Labs    10/27/22 0045 10/27/22 0500  WBC 23.6* 22.8*  HGB 11.5* 10.9*  HCT 34.4* 32.7*  PLT 218 217   BMET Recent Labs    10/27/22 0045 10/27/22 0500  NA 140 142  K 3.0* 2.8*  CL 103 107  CO2 25 24  GLUCOSE 98 108*  BUN 31* 29*  CREATININE 2.25* 2.13*  CALCIUM 7.9* 7.8*   PT/INR No results for input(s): "LABPROT", "INR" in the last 72 hours. CMP     Component Value Date/Time   NA 142 10/27/2022 0500   K 2.8 (L) 10/27/2022 0500   CL 107 10/27/2022 0500   CO2 24 10/27/2022 0500   GLUCOSE 108 (H) 10/27/2022 0500   BUN 29 (H) 10/27/2022 0500   CREATININE 2.13 (H) 10/27/2022 0500   CALCIUM 7.8 (L) 10/27/2022 0500   PROT 7.5 03/24/2021 0858   ALBUMIN 4.5 03/24/2021 0858   AST 87 (H) 03/24/2021 0858   ALT 100 (H) 03/24/2021 0858   ALKPHOS 97 03/24/2021 0858   BILITOT 1.2 03/24/2021 0858    GFRNONAA 28 (L) 10/27/2022 0500   GFRAA 15 (L) 11/05/2019 1500   Lipase     Component Value Date/Time   LIPASE 48 03/24/2021 0858    Studies/Results: CT ABDOMEN PELVIS WO CONTRAST  Result Date: 10/27/2022 CLINICAL DATA:  Buttock abscess, leukocytosis EXAM: CT ABDOMEN AND PELVIS WITHOUT CONTRAST TECHNIQUE: Multidetector CT imaging of the abdomen and pelvis was performed following the standard protocol without IV contrast. RADIATION DOSE REDUCTION: This exam was performed according to the departmental dose-optimization program which includes automated exposure control, adjustment of the mA and/or kV according to patient size and/or use of iterative reconstruction technique. COMPARISON:  08/27/2018 FINDINGS: Lower chest: Mild subpleural scarring in the right lower lobe. Hepatobiliary: Unenhanced liver is unremarkable. Gallbladder is unremarkable. No intrahepatic or extrahepatic duct dilatation. Pancreas: Within normal limits. Spleen: Within normal limits. Adrenals/Urinary Tract: 2.7 cm left adrenal nodule, measuring 35 HUs and therefore indeterminate, but unchanged from 2020 (when remeasured), and therefore compatible with a benign adrenal adenoma. Right adrenal gland is within normal limits. Kidneys are within normal limits. No renal calculi or hydronephrosis. Bladder is within normal limits. Stomach/Bowel: Stomach is within normal limits. No evidence of bowel obstruction. Normal appendix (series 2/image 44). No colonic wall thickening or inflammatory changes. Vascular/Lymphatic: No evidence of abdominal aortic aneurysm. No suspicious abdominopelvic  lymphadenopathy. Reproductive: Uterine fibroids. Bilateral ovaries are within normal limits. Other: No abdominopelvic ascites. Musculoskeletal: Mild soft tissue gas in the left medial thigh/perineal region (series 3/image 7) with mild subcutaneous thickening/stranding in the left gluteal region with underlying stranding (series 3/image 14), suggesting  cellulitis, without drainable fluid collection/abscess. Status post PLIF at L4-5. Mild degenerative changes of the lower thoracic spine. IMPRESSION: Left gluteal cellulitis, without drainable fluid collection/abscess. Mild soft tissue gas in the left medial thigh/perineal region suggests recent intervention. If the patient has not had intervention, this raises concern for early gangrenous infection. Additional ancillary findings as above. Electronically Signed   By: Sriyesh  Krishnan M.D.   On: 10/27/2022 02:11    Anti-infectives: Anti-infectives (From admission, onward)    Start     Dose/Rate Route Frequency Ordered Stop   10/27/22 1000  piperacillin-tazobactam (ZOSYN) IVPB 3.375 g        3.375 g 12.5 mL/hr over 240 Minutes Intravenous Every 8 hours 10/27/22 0458     10/27/22 0300  vancomycin (VANCOCIN) IVPB 1000 mg/200 mL premix        1,000 mg 200 mL/hr over 60 Minutes Intravenous  Once 10/27/22 0256 10/27/22 0603   10/27/22 0300  piperacillin-tazobactam (ZOSYN) IVPB 3.375 g        3.375 g 100 mL/hr over 30 Minutes Intravenous  Once 10/27/22 0256 10/27/22 0406   10/27/22 0300  clindamycin (CLEOCIN) IVPB 600 mg        600 mg 100 mL/hr over 30 Minutes Intravenous  Once 10/27/22 0256 10/27/22 0448        Assessment/Plan POD 1 s/p EUA, I&D of perirectal abscess by Dr. Toth on 4/17 - Plan first dressing change and relook tomorrow for possible further debridement in OR tomorrow.  - Cont abx - Cx pending. Gram stain with gram pos cocci and gram neg rods - Mobilize - Pulm toilet - Labs pending (CBC, BMP, Mg, A1c)  FEN - Reg, NPO at midnight. IVF at 100ml/hr (reassess after labs) VTE - SCDs, Lovenox ID - Zosyn Foley - None, voiding  Hx CKD2 per notes - Cr pending this am Hypokalemia - labs pending Hx HTN - home meds Hx PE - Remote, not on anticoagulation   LOS: 1 day    Isahia Hollerbach M Alexiz Cothran , PA-C [facility] 10/28/2022, 8:17 AM Please see Amion for pager number  during day hours 7:00am-4:30pm  

## 2022-10-29 ENCOUNTER — Encounter (HOSPITAL_COMMUNITY): Admission: EM | Disposition: A | Discharge status: Home Health | Payer: Self-pay | Source: Home / Self Care

## 2022-10-29 ENCOUNTER — Inpatient Hospital Stay (HOSPITAL_COMMUNITY): Payer: Managed Care, Other (non HMO) | Admitting: Anesthesiology

## 2022-10-29 ENCOUNTER — Other Ambulatory Visit: Payer: Self-pay

## 2022-10-29 ENCOUNTER — Encounter (HOSPITAL_COMMUNITY): Payer: Self-pay

## 2022-10-29 DIAGNOSIS — I82409 Acute embolism and thrombosis of unspecified deep veins of unspecified lower extremity: Secondary | ICD-10-CM

## 2022-10-29 DIAGNOSIS — I10 Essential (primary) hypertension: Secondary | ICD-10-CM

## 2022-10-29 DIAGNOSIS — K611 Rectal abscess: Secondary | ICD-10-CM

## 2022-10-29 DIAGNOSIS — D649 Anemia, unspecified: Secondary | ICD-10-CM

## 2022-10-29 DIAGNOSIS — F17210 Nicotine dependence, cigarettes, uncomplicated: Secondary | ICD-10-CM

## 2022-10-29 HISTORY — PX: RECTAL EXAM UNDER ANESTHESIA: SHX6399

## 2022-10-29 LAB — CBC
HCT: 33.5 % — ABNORMAL LOW (ref 36.0–46.0)
Hemoglobin: 11.1 g/dL — ABNORMAL LOW (ref 12.0–15.0)
MCH: 30.3 pg (ref 26.0–34.0)
MCHC: 33.1 g/dL (ref 30.0–36.0)
MCV: 91.5 fL (ref 80.0–100.0)
Platelets: 263 10*3/uL (ref 150–400)
RBC: 3.66 MIL/uL — ABNORMAL LOW (ref 3.87–5.11)
RDW: 15.4 % (ref 11.5–15.5)
WBC: 24.3 10*3/uL — ABNORMAL HIGH (ref 4.0–10.5)
nRBC: 0.1 % (ref 0.0–0.2)

## 2022-10-29 LAB — BASIC METABOLIC PANEL
Anion gap: 9 (ref 5–15)
BUN: 37 mg/dL — ABNORMAL HIGH (ref 6–20)
CO2: 21 mmol/L — ABNORMAL LOW (ref 22–32)
Calcium: 7.6 mg/dL — ABNORMAL LOW (ref 8.9–10.3)
Chloride: 107 mmol/L (ref 98–111)
Creatinine, Ser: 2.12 mg/dL — ABNORMAL HIGH (ref 0.44–1.00)
GFR, Estimated: 28 mL/min — ABNORMAL LOW (ref >60)
Glucose, Bld: 100 mg/dL — ABNORMAL HIGH (ref 70–99)
Potassium: 4.1 mmol/L (ref 3.5–5.1)
Sodium: 137 mmol/L (ref 135–145)

## 2022-10-29 LAB — POCT PREGNANCY, URINE: Preg Test, Ur: NEGATIVE

## 2022-10-29 LAB — AEROBIC/ANAEROBIC CULTURE W GRAM STAIN (SURGICAL/DEEP WOUND)

## 2022-10-29 LAB — SURGICAL PCR SCREEN
MRSA, PCR: NEGATIVE
Staphylococcus aureus: NEGATIVE

## 2022-10-29 SURGERY — EXAM UNDER ANESTHESIA, RECTUM
Anesthesia: General

## 2022-10-29 MED ORDER — PROPOFOL 10 MG/ML IV BOLUS
INTRAVENOUS | Status: AC
  Filled 2022-10-29: qty 20

## 2022-10-29 MED ORDER — MIDAZOLAM HCL 2 MG/2ML IJ SOLN: 0.5000 mg | Freq: Once | INTRAMUSCULAR | Status: DC | PRN

## 2022-10-29 MED ORDER — MEPERIDINE HCL 50 MG/ML IJ SOLN: 6.2500 mg | INTRAMUSCULAR | Status: DC | PRN

## 2022-10-29 MED ORDER — FENTANYL CITRATE PF 50 MCG/ML IJ SOSY
PREFILLED_SYRINGE | INTRAMUSCULAR | Status: AC
  Filled 2022-10-29: qty 2

## 2022-10-29 MED ORDER — FENTANYL CITRATE (PF) 100 MCG/2ML IJ SOLN
INTRAMUSCULAR | Status: AC
  Filled 2022-10-29: qty 2

## 2022-10-29 MED ORDER — LACTATED RINGERS IV SOLN: INTRAVENOUS | Status: DC | PRN

## 2022-10-29 MED ORDER — SUGAMMADEX SODIUM 200 MG/2ML IV SOLN
INTRAVENOUS | Status: DC | PRN
  Administered 2022-10-29: 400 mg via INTRAVENOUS

## 2022-10-29 MED ORDER — DEXAMETHASONE SODIUM PHOSPHATE 4 MG/ML IJ SOLN
INTRAMUSCULAR | Status: DC | PRN
  Administered 2022-10-29: 8 mg via INTRAVENOUS

## 2022-10-29 MED ORDER — ROCURONIUM BROMIDE 10 MG/ML (PF) SYRINGE
PREFILLED_SYRINGE | INTRAVENOUS | Status: DC | PRN
  Administered 2022-10-29: 70 mg via INTRAVENOUS

## 2022-10-29 MED ORDER — ROCURONIUM BROMIDE 10 MG/ML (PF) SYRINGE
PREFILLED_SYRINGE | INTRAVENOUS | Status: AC
  Filled 2022-10-29: qty 10

## 2022-10-29 MED ORDER — HYDROMORPHONE HCL 1 MG/ML IJ SOLN
0.5000 mg | INTRAMUSCULAR | Status: DC | PRN
  Administered 2022-10-31 – 2022-11-01 (×4): 1 mg via INTRAVENOUS
  Filled 2022-10-29 (×4): qty 1

## 2022-10-29 MED ORDER — 0.9 % SODIUM CHLORIDE (POUR BTL) OPTIME
TOPICAL | Status: DC | PRN
  Administered 2022-10-29: 1000 mL

## 2022-10-29 MED ORDER — MIDAZOLAM HCL 5 MG/5ML IJ SOLN
INTRAMUSCULAR | Status: DC | PRN
  Administered 2022-10-29: 1 mg via INTRAVENOUS

## 2022-10-29 MED ORDER — SODIUM CHLORIDE 0.9 % IV SOLN: INTRAVENOUS | Status: DC

## 2022-10-29 MED ORDER — BUPIVACAINE HCL (PF) 0.25 % IJ SOLN
INTRAMUSCULAR | Status: AC
  Filled 2022-10-29: qty 30

## 2022-10-29 MED ORDER — LIDOCAINE 2% (20 MG/ML) 5 ML SYRINGE
INTRAMUSCULAR | Status: DC | PRN
  Administered 2022-10-29: 40 mg via INTRAVENOUS

## 2022-10-29 MED ORDER — CHLORHEXIDINE GLUCONATE 0.12 % MT SOLN
15.0000 mL | Freq: Once | OROMUCOSAL | Status: AC
  Administered 2022-10-29: 15 mL via OROMUCOSAL

## 2022-10-29 MED ORDER — MIDAZOLAM HCL 2 MG/2ML IJ SOLN
INTRAMUSCULAR | Status: AC
  Filled 2022-10-29: qty 2

## 2022-10-29 MED ORDER — METOPROLOL TARTRATE 5 MG/5ML IV SOLN
INTRAVENOUS | Status: DC | PRN
  Administered 2022-10-29: 3 mg via INTRAVENOUS
  Administered 2022-10-29: 2 mg via INTRAVENOUS

## 2022-10-29 MED ORDER — OXYCODONE HCL 5 MG/5ML PO SOLN: 5.0000 mg | Freq: Once | ORAL | Status: DC | PRN

## 2022-10-29 MED ORDER — FENTANYL CITRATE (PF) 100 MCG/2ML IJ SOLN
INTRAMUSCULAR | Status: DC | PRN
  Administered 2022-10-29 (×2): 50 ug via INTRAVENOUS
  Administered 2022-10-29: 100 ug via INTRAVENOUS

## 2022-10-29 MED ORDER — PROPOFOL 10 MG/ML IV BOLUS
INTRAVENOUS | Status: DC | PRN
  Administered 2022-10-29: 200 mg via INTRAVENOUS

## 2022-10-29 MED ORDER — OXYCODONE HCL 5 MG PO TABS: 5.0000 mg | ORAL_TABLET | Freq: Once | ORAL | Status: DC | PRN

## 2022-10-29 MED ORDER — PROMETHAZINE HCL 25 MG/ML IJ SOLN: 6.2500 mg | INTRAMUSCULAR | Status: DC | PRN

## 2022-10-29 MED ORDER — FENTANYL CITRATE PF 50 MCG/ML IJ SOSY
25.0000 ug | PREFILLED_SYRINGE | INTRAMUSCULAR | Status: DC | PRN
  Administered 2022-10-29 (×2): 50 ug via INTRAVENOUS

## 2022-10-29 SURGICAL SUPPLY — 41 items
BAG COUNTER SPONGE SURGICOUNT (BAG) IMPLANT
BENZOIN TINCTURE PRP APPL 2/3 (GAUZE/BANDAGES/DRESSINGS) ×1 IMPLANT
BLADE SURG 15 STRL LF DISP TIS (BLADE) IMPLANT
BLADE SURG 15 STRL SS (BLADE)
BLADE SURG SZ10 CARB STEEL (BLADE) ×1 IMPLANT
BNDG GAUZE DERMACEA FLUFF 4 (GAUZE/BANDAGES/DRESSINGS) IMPLANT
BRIEF MESH DISP LRG (UNDERPADS AND DIAPERS) ×2 IMPLANT
COVER SURGICAL LIGHT HANDLE (MISCELLANEOUS) ×1 IMPLANT
DRAPE UTILITY XL STRL (DRAPES) ×1 IMPLANT
DRSG VASELINE 3X18 (GAUZE/BANDAGES/DRESSINGS) IMPLANT
ELECT REM PT RETURN 15FT ADLT (MISCELLANEOUS) ×1 IMPLANT
GAUZE 4X4 16PLY ~~LOC~~+RFID DBL (SPONGE) IMPLANT
GAUZE PAD ABD 7.5X8 STRL (GAUZE/BANDAGES/DRESSINGS) IMPLANT
GAUZE PAD ABD 8X10 STRL (GAUZE/BANDAGES/DRESSINGS) IMPLANT
GAUZE SPONGE 4X4 12PLY STRL (GAUZE/BANDAGES/DRESSINGS) IMPLANT
GLOVE BIO SURGEON STRL SZ 6.5 (GLOVE) ×1 IMPLANT
GLOVE BIOGEL PI IND STRL 7.0 (GLOVE) ×1 IMPLANT
GLOVE INDICATOR 6.5 STRL GRN (GLOVE) ×1 IMPLANT
GOWN STRL REUS W/ TWL XL LVL3 (GOWN DISPOSABLE) ×2 IMPLANT
GOWN STRL REUS W/TWL XL LVL3 (GOWN DISPOSABLE) ×2
KIT BASIN OR (CUSTOM PROCEDURE TRAY) ×1 IMPLANT
KIT TURNOVER KIT A (KITS) IMPLANT
LEGGING LITHOTOMY PAIR STRL (DRAPES) IMPLANT
LOOP VESSEL MAXI BLUE (MISCELLANEOUS) IMPLANT
NDL HYPO 25X1 1.5 SAFETY (NEEDLE) ×1 IMPLANT
NDL SAFETY ECLIP 18X1.5 (MISCELLANEOUS) IMPLANT
NEEDLE HYPO 25X1 1.5 SAFETY (NEEDLE) ×1 IMPLANT
PACK LITHOTOMY IV (CUSTOM PROCEDURE TRAY) ×1 IMPLANT
PENCIL SMOKE EVACUATOR (MISCELLANEOUS) IMPLANT
SPIKE FLUID TRANSFER (MISCELLANEOUS) ×1 IMPLANT
SPONGE SURGIFOAM ABS GEL 12-7 (HEMOSTASIS) IMPLANT
SUT CHROMIC 2 0 SH (SUTURE) IMPLANT
SUT ETHIBOND 0 (SUTURE) IMPLANT
SUT GUT CHROMIC 3 0 (SUTURE) IMPLANT
SUT MON AB 3-0 SH 27 (SUTURE) ×1
SUT MON AB 3-0 SH27 (SUTURE) ×1 IMPLANT
SUT VIC AB 4-0 P-3 18XBRD (SUTURE) IMPLANT
SUT VIC AB 4-0 P3 18 (SUTURE)
SYR CONTROL 10ML LL (SYRINGE) ×1 IMPLANT
TOWEL OR 17X26 10 PK STRL BLUE (TOWEL DISPOSABLE) ×1 IMPLANT
TOWEL OR NON WOVEN STRL DISP B (DISPOSABLE) ×1 IMPLANT

## 2022-10-29 NOTE — Anesthesia Preprocedure Evaluation (Addendum)
Anesthesia Evaluation  Patient identified by MRN, date of birth, ID band Patient awake    Reviewed: Allergy & Precautions, NPO status , Patient's Chart, lab work & pertinent test results, reviewed documented beta blocker date and time   History of Anesthesia Complications Negative for: history of anesthetic complications  Airway Mallampati: II  TM Distance: >3 FB Neck ROM: Full    Dental  (+) Dental Advisory Given   Pulmonary Current Smoker and Patient abstained from smoking.   breath sounds clear to auscultation       Cardiovascular hypertension, Pt. on medications and Pt. on home beta blockers (-) angina + DVT   Rhythm:Regular Rate:Normal     Neuro/Psych Chronic back pain    GI/Hepatic negative GI ROS, Neg liver ROS,,,  Endo/Other    Morbid obesityBMI 49  Renal/GU Renal InsufficiencyRenal disease     Musculoskeletal   Abdominal  (+) + obese  Peds  Hematology  (+) Blood dyscrasia (Hb 11.1), anemia   Anesthesia Other Findings   Reproductive/Obstetrics                             Anesthesia Physical Anesthesia Plan  ASA: 3  Anesthesia Plan: General   Post-op Pain Management: Tylenol PO (pre-op)*   Induction: Intravenous  PONV Risk Score and Plan: 3 and Ondansetron, Dexamethasone and Treatment may vary due to age or medical condition  Airway Management Planned: Oral ETT  Additional Equipment: None  Intra-op Plan:   Post-operative Plan: Extubation in OR  Informed Consent: I have reviewed the patients History and Physical, chart, labs and discussed the procedure including the risks, benefits and alternatives for the proposed anesthesia with the patient or authorized representative who has indicated his/her understanding and acceptance.     Dental advisory given  Plan Discussed with: Surgeon and CRNA  Anesthesia Plan Comments:        Anesthesia Quick Evaluation

## 2022-10-29 NOTE — Anesthesia Procedure Notes (Signed)
Procedure Name: Intubation Date/Time: 10/29/2022 9:39 AM  Performed by: Ludwig Lean, CRNAPre-anesthesia Checklist: Patient identified, Emergency Drugs available, Suction available and Patient being monitored Patient Re-evaluated:Patient Re-evaluated prior to induction Oxygen Delivery Method: Circle system utilized Preoxygenation: Pre-oxygenation with 100% oxygen Induction Type: IV induction Ventilation: Mask ventilation without difficulty Laryngoscope Size: Mac and 4 Grade View: Grade II Tube type: Oral Tube size: 7.0 mm Number of attempts: 2 Airway Equipment and Method: Stylet Placement Confirmation: ETT inserted through vocal cords under direct vision, positive ETCO2 and breath sounds checked- equal and bilateral Secured at: 21 cm Tube secured with: Tape Dental Injury: Teeth and Oropharynx as per pre-operative assessment

## 2022-10-29 NOTE — Addendum Note (Signed)
Addendum  created 10/29/22 0900 by Ludwig Lean, CRNA   Clinical Note Signed, Intraprocedure Blocks edited, Intraprocedure Event edited

## 2022-10-29 NOTE — Interval H&P Note (Signed)
History and Physical Interval Note:  10/29/2022 8:59 AM  Caitlin Taylor  has presented today for surgery, with the diagnosis of PERIRECTAL ABSCESS.  The various methods of treatment have been discussed with the patient and family. After consideration of risks, benefits and other options for treatment, the patient has consented to  Procedure(s): RECTAL EXAM UNDER ANESTHESIA (N/A) as a surgical intervention.  The patient's history has been reviewed, patient examined, no change in status, stable for surgery.  I have reviewed the patient's chart and labs.  Questions were answered to the patient's satisfaction.     [name]

## 2022-10-29 NOTE — Op Note (Signed)
10/29/2022  10:17 AM  PATIENT:  Caitlin Taylor  52 y.o. female  PRE-OPERATIVE DIAGNOSIS:  PERIRECTAL ABSCESS  POST-OPERATIVE DIAGNOSIS:  PERIRECTAL ABSCESS  PROCEDURE:  Procedure(s): RECTAL EXAM UNDER ANESTHESIA; INCISION AND DEBRIDEMENT OF PERI-RECTAL ABSCESS; DRESSING CHANGE (N/A)  SURGEON:  Surgeon(s) and Role:    * Griselda Miner, MD - Primary  PHYSICIAN ASSISTANT:   ASSISTANTS: none   ANESTHESIA:   general  EBL:  minimal   BLOOD ADMINISTERED:none  DRAINS: none   LOCAL MEDICATIONS USED:  NONE  SPECIMEN:  No Specimen  DISPOSITION OF SPECIMEN:  N/A  COUNTS:  YES  TOURNIQUET:  * No tourniquets in log *  DICTATION: .Dragon Dictation  After informed consent was obtained the patient was brought to the operating room and placed in the supine position on the operating table.  After adequate induction of general anesthesia the patient was moved into the lithotomy position and all pressure points were padded.  The perirectal area was then prepped with Betadine and draped in usual sterile manner.  An appropriate timeout was performed.  The old packing was removed from the left open perirectal wound.  There was pus coming from the upper portion of the wound.  This was probed bluntly with a finger and the tissue planes opened up more to allow more pus to drain out.  The incision was opened superiorly to open this additional cavity.  Hemostasis was achieved using the Bovie electrocautery.  The depth of the wound was then probed and no further areas of pus were identified.  The tissue appeared viable.  At this point the wound was then repacked with a clean Kerlix gauze.  Sterile dressings were then applied.  The patient tolerated the procedure well.  At the end of the case all needle sponge and instrument counts were correct.  The patient was then awakened and taken to recovery in stable condition.  PLAN OF CARE: Admit to inpatient   PATIENT DISPOSITION:  PACU - hemodynamically  stable.   Delay start of Pharmacological VTE agent (>24hrs) due to surgical blood loss or risk of bleeding: no

## 2022-10-29 NOTE — Transfer of Care (Signed)
Immediate Anesthesia Transfer of Care Note  Patient: Caitlin Taylor  Procedure(s) Performed: Procedure(s): RECTAL EXAM UNDER ANESTHESIA; INCISION AND DEBRIDEMENT OF PERI-RECTAL ABSCESS; DRESSING CHANGE (N/A)  Patient Location: PACU  Anesthesia Type:General  Level of Consciousness: Patient easily awoken, sedated, comfortable, cooperative, following commands, responds to stimulation.   Airway & Oxygen Therapy: Patient spontaneously breathing, ventilating well, oxygen via simple oxygen mask.  Post-op Assessment: Report given to PACU RN, vital signs reviewed and stable, moving all extremities.   Post vital signs: Reviewed and stable.  Complications: No apparent anesthesia complications Last Vitals:  Vitals Value Taken Time  BP 130/69 10/29/22 1035  Temp    Pulse 72 10/29/22 1038  Resp 13 10/29/22 1038  SpO2 100 % 10/29/22 1038  Vitals shown include unvalidated device data.  Last Pain:  Vitals:   10/29/22 0813  TempSrc:   PainSc: 8       Patients Stated Pain Goal: 3 (10/29/22 0434)  Complications: No notable events documented.

## 2022-10-29 NOTE — Anesthesia Postprocedure Evaluation (Signed)
Anesthesia Post Note  Patient: ASHANTA AMOROSO  Procedure(s) Performed: RECTAL EXAM UNDER ANESTHESIA; INCISION AND DEBRIDEMENT OF PERI-RECTAL ABSCESS; DRESSING CHANGE     Patient location during evaluation: PACU Anesthesia Type: General Level of consciousness: awake and alert, patient cooperative and oriented Pain management: pain level controlled Vital Signs Assessment: post-procedure vital signs reviewed and stable Respiratory status: spontaneous breathing, nonlabored ventilation and respiratory function stable Cardiovascular status: blood pressure returned to baseline and stable Postop Assessment: no apparent nausea or vomiting Anesthetic complications: no   No notable events documented.  Last Vitals:  Vitals:   10/29/22 1100 10/29/22 1115  BP: 136/89 136/85  Pulse: 70 67  Resp: 14 15  Temp:  36.4 C  SpO2: 93% 94%    Last Pain:  Vitals:   10/29/22 1100  TempSrc:   PainSc: Asleep                 Keigan Tafoya,E. Lavella Myren

## 2022-10-29 NOTE — TOC Progression Note (Signed)
Transition of Care [facility]) - Progression Note    Patient Details  Name: Caitlin Taylor MRN: [id number] Date of Birth: 02/20/1971  Transition of Care [facility]) CM/SW Contact  Beckie Busing, RN Phone [phone]  10/29/2022, 2:22 PM  Clinical Narrative:     Transition of Care (TOC) Screening Note   Patient Details  Name: Caitlin Taylor Date of Birth: 1970-10-31   Transition of Care (TOC) CM/SW Contact:    Beckie Busing, RN Phone Number: 10/29/2022, 2:22 PM    Transition of Care Department [facility]) has reviewed patient and no TOC needs have been identified at this time. We will continue to monitor patient advancement through interdisciplinary progression rounds. If new patient transition needs arise, please place a TOC consult.          Expected Discharge Plan and Services                                               Social Determinants of Health (SDOH) Interventions SDOH Screenings   Food Insecurity: No Food Insecurity (10/27/2022)  Housing: Low Risk  (10/27/2022)  Transportation Needs: No Transportation Needs (10/27/2022)  Utilities: Not At Risk (10/27/2022)  Tobacco Use: Low Risk  (10/29/2022)    Readmission Risk Interventions     No data to display

## 2022-10-30 ENCOUNTER — Encounter (HOSPITAL_COMMUNITY): Payer: Self-pay | Admitting: General Surgery

## 2022-10-30 LAB — AEROBIC/ANAEROBIC CULTURE W GRAM STAIN (SURGICAL/DEEP WOUND): Gram Stain: NONE SEEN

## 2022-10-30 NOTE — Progress Notes (Signed)
1 Day Post-Op   Subjective/Chief Complaint: Pt doing well Tol PO Pain OK   Objective: Vital signs in last 24 hours: Temp:  [96.8 F (36 C)-98.4 F (36.9 C)] 98.1 F (36.7 C) (04/20 0622) Pulse Rate:  [62-72] 62 (04/20 0622) Resp:  [14-18] 18 (04/20 0622) BP: (128-151)/(69-98) 143/83 (04/20 0622) SpO2:  [93 %-100 %] 99 % (04/20 0622) Weight:  [607 kg] 126 kg (04/19 0813) Last BM Date : 10/29/22  Intake/Output from previous day: 04/19 0701 - 04/20 0700 In: 2071.9 [P.O.:480; I.V.:1441.9; IV Piggyback:150] Out: 1310 [Urine:1300; Blood:10] Intake/Output this shift: No intake/output data recorded.  Incision/Wound: c/d/I, no erythema  Lab Results:  Recent Labs    10/28/22 0922 10/29/22 0539  WBC 25.8* 24.3*  HGB 9.1* 11.1*  HCT 27.6* 33.5*  PLT 195 263   BMET Recent Labs    10/28/22 0922 10/29/22 0539  NA 140 137  K 3.1* 4.1  CL 112* 107  CO2 19* 21*  GLUCOSE 149* 100*  BUN 30* 37*  CREATININE 1.92* 2.12*  CALCIUM 6.6* 7.6*   PT/INR No results for input(s): "LABPROT", "INR" in the last 72 hours. ABG No results for input(s): "PHART", "HCO3" in the last 72 hours.  Invalid input(s): "PCO2", "PO2"  Studies/Results: No results found.  Anti-infectives: Anti-infectives (From admission, onward)    Start     Dose/Rate Route Frequency Ordered Stop   10/27/22 1000  piperacillin-tazobactam (ZOSYN) IVPB 3.375 g        3.375 g 12.5 mL/hr over 240 Minutes Intravenous Every 8 hours 10/27/22 0458     10/27/22 0300  vancomycin (VANCOCIN) IVPB 1000 mg/200 mL premix        1,000 mg 200 mL/hr over 60 Minutes Intravenous  Once 10/27/22 0256 10/27/22 0603   10/27/22 0300  piperacillin-tazobactam (ZOSYN) IVPB 3.375 g        3.375 g 100 mL/hr over 30 Minutes Intravenous  Once 10/27/22 0256 10/27/22 0406   10/27/22 0300  clindamycin (CLEOCIN) IVPB 600 mg        600 mg 100 mL/hr over 30 Minutes Intravenous  Once 10/27/22 0256 10/27/22 0448       Assessment/Plan: s/p  Procedure(s): RECTAL EXAM UNDER ANESTHESIA; INCISION AND DEBRIDEMENT OF PERI-RECTAL ABSCESS; DRESSING CHANGE (N/A) POD 1 s/p I&D perirectal abscess -Con't abx -start dressing changes at the bedside. -mobilize  LOS: 3 days    Axel Filler 10/30/2022

## 2022-10-31 NOTE — Progress Notes (Signed)
2 Days Post-Op   Subjective/Chief Complaint: Pt doing well this AM Tol dsg changes yesterday.  Just changed this AM   Objective: Vital signs in last 24 hours: Temp:  [97.6 F (36.4 C)-98.4 F (36.9 C)] 97.6 F (36.4 C) (04/21 0434) Pulse Rate:  [68-80] 73 (04/21 0434) Resp:  [15-18] 15 (04/21 0434) BP: (129-132)/(72-90) 129/72 (04/21 0434) SpO2:  [97 %-99 %] 99 % (04/21 0434) Last BM Date : 10/30/22  Intake/Output from previous day: 04/20 0701 - 04/21 0700 In: 1700.5 [P.O.:540; I.V.:1110.5; IV Piggyback:50] Out: 2200 [Urine:2200] Intake/Output this shift: No intake/output data recorded.  General appearance: alert and cooperative Incision/Wound: wound packed  Lab Results:  Recent Labs    10/28/22 0922 10/29/22 0539  WBC 25.8* 24.3*  HGB 9.1* 11.1*  HCT 27.6* 33.5*  PLT 195 263   BMET Recent Labs    10/28/22 0922 10/29/22 0539  NA 140 137  K 3.1* 4.1  CL 112* 107  CO2 19* 21*  GLUCOSE 149* 100*  BUN 30* 37*  CREATININE 1.92* 2.12*  CALCIUM 6.6* 7.6*   PT/INR No results for input(s): "LABPROT", "INR" in the last 72 hours. ABG No results for input(s): "PHART", "HCO3" in the last 72 hours.  Invalid input(s): "PCO2", "PO2"  Studies/Results: No results found.  Anti-infectives: Anti-infectives (From admission, onward)    Start     Dose/Rate Route Frequency Ordered Stop   10/27/22 1000  piperacillin-tazobactam (ZOSYN) IVPB 3.375 g        3.375 g 12.5 mL/hr over 240 Minutes Intravenous Every 8 hours 10/27/22 0458     10/27/22 0300  vancomycin (VANCOCIN) IVPB 1000 mg/200 mL premix        1,000 mg 200 mL/hr over 60 Minutes Intravenous  Once 10/27/22 0256 10/27/22 0603   10/27/22 0300  piperacillin-tazobactam (ZOSYN) IVPB 3.375 g        3.375 g 100 mL/hr over 30 Minutes Intravenous  Once 10/27/22 0256 10/27/22 0406   10/27/22 0300  clindamycin (CLEOCIN) IVPB 600 mg        600 mg 100 mL/hr over 30 Minutes Intravenous  Once 10/27/22 0256 10/27/22 0448        Assessment/Plan: s/p Procedure(s): RECTAL EXAM UNDER ANESTHESIA; INCISION AND DEBRIDEMENT OF PERI-RECTAL ABSCESS; DRESSING CHANGE (N/A) POD 2 s/p I&D perirectal abscess -Con't abx -dressing changes at the bedside, may need HH for dressing changes,  -OK to shower -mobilize -home in next 1-2d  LOS: 4 days    Axel Filler 10/31/2022

## 2022-11-01 DIAGNOSIS — K611 Rectal abscess: Secondary | ICD-10-CM | POA: Diagnosis not present

## 2022-11-01 LAB — CBC
HCT: 35.3 % — ABNORMAL LOW (ref 36.0–46.0)
Hemoglobin: 11.5 g/dL — ABNORMAL LOW (ref 12.0–15.0)
MCH: 30.3 pg (ref 26.0–34.0)
MCHC: 32.6 g/dL (ref 30.0–36.0)
MCV: 93.1 fL (ref 80.0–100.0)
Platelets: 315 10*3/uL (ref 150–400)
RBC: 3.79 MIL/uL — ABNORMAL LOW (ref 3.87–5.11)
RDW: 15.8 % — ABNORMAL HIGH (ref 11.5–15.5)
WBC: 20.6 10*3/uL — ABNORMAL HIGH (ref 4.0–10.5)
nRBC: 0.4 % — ABNORMAL HIGH (ref 0.0–0.2)

## 2022-11-01 MED ORDER — AMOXICILLIN-POT CLAVULANATE 875-125 MG PO TABS: 1.0000 | ORAL_TABLET | Freq: Two times a day (BID) | ORAL | 0 refills | Status: DC

## 2022-11-01 MED ORDER — METRONIDAZOLE 500 MG PO TABS
500.0000 mg | ORAL_TABLET | Freq: Two times a day (BID) | ORAL | Status: DC
  Administered 2022-11-01 – 2022-11-04 (×6): 500 mg via ORAL
  Filled 2022-11-01 (×6): qty 1

## 2022-11-01 MED ORDER — METRONIDAZOLE 500 MG PO TABS: 500.0000 mg | ORAL_TABLET | Freq: Two times a day (BID) | ORAL | 0 refills | Status: AC

## 2022-11-01 MED ORDER — MORPHINE SULFATE (PF) 2 MG/ML IV SOLN
1.0000 mg | INTRAVENOUS | Status: DC | PRN
  Administered 2022-11-01: 2 mg via INTRAVENOUS
  Filled 2022-11-01: qty 1

## 2022-11-01 MED ORDER — CEFTRIAXONE IV (FOR PTA / DISCHARGE USE ONLY): 2.0000 g | INTRAVENOUS | 0 refills | Status: AC

## 2022-11-01 MED ORDER — SODIUM CHLORIDE 0.9 % IV SOLN
2.0000 g | INTRAVENOUS | Status: DC
  Administered 2022-11-01 – 2022-11-03 (×3): 2 g via INTRAVENOUS
  Filled 2022-11-01 (×3): qty 20

## 2022-11-01 NOTE — Consult Note (Signed)
[facility]    Date of Admission:  10/26/2022     Reason for Consult: perirectal abscess/actinomyces    Referring Provider: General Surgery     Lines:  Peripheral iv's  Abx: piptazo        Assessment: 52 yo female admitted with 1 week perianal pain found to have perirectal abscess   4/17 s/p I&D of perirectal abscess; cx actinomyces species, and non-esbl ecoli (resistant to amp-sulb; S piptazo, ceftriaxone)  Given the presence of actino, she might benefit from prolonged treatment of up to 3 months, now that she had I&D. No classic actino syndrome so unclear if truly need but would stay on long course and see if can deescalate early  Plan 2 more weeks iv abx and then 3 months oral abx  Plan: Transition abx to ceftriaxone iv and po flagyl -- plan 2 weeks from 10/27/22 until 11/11/2022 On 11/12/2022 would stop ceftriaxone/flagyl and start amox-clav 875 mg po bid for 3 months Id clinic f/u as below Will sign off Discussed with primary team    OPAT Orders Discharge antibiotics to be given via PICC line Discharge antibiotics: Ceftriaxone 2 gram iv Metronidazole 500 mg PO bid  Duration: 2 weeks End Date: 11/11/2022  On 5/03 to start amox-clav 875 mg po bid for 3 months  PIC Care Per Protocol:  Home health RN for IV administration and teaching; PICC line care and labs.    Labs weekly while on IV antibiotics: _x_ CBC with differential __ BMP _x_ CMP _x_ CRP __ ESR __ Vancomycin trough __ CK  _x_ Please pull PIC at completion of IV antibiotics __ Please leave PIC in place until doctor has seen patient or been notified  Fax weekly labs to [phone]  Clinic Follow Up Appt: 5/14 @ 345  @  [facility] [address], [place], [place] [address] Phone: [phone]     I spent more than 80 minute reviewing data/chart, and coordinating care and >50% direct face to face time providing counseling/discussing  diagnostics/treatment plan with patient  ------------------------------------------------ Principal Problem:   Perirectal abscess    HPI: Caitlin Taylor is a 52 y.o. female admitted with perirectal abscess   Patient had a week rectal pain/swelling She has had abscesses 2 times in past but different location  Wbc 20s on admission Afebrile Ct showed no drainable abscess Surgery I&D area 4/17; cx actino/ecoli  Patient has been improving on piptazo  She is in the process of disposition  2 episodes of vomiting today which she attributes to dilaudid No n/diarrhea  No diabetes mellitus  No hx pilonidal cyst  She currently has no drain catheter  Family History  Problem Relation Age of Onset   Congestive Heart Failure Father     Social History   Tobacco Use   Smoking status: Never   Smokeless tobacco: Never  Vaping Use   Vaping Use: Never used  Substance Use Topics   Alcohol use: Not Currently    Comment: rare   Drug use: No    No Known Allergies  Review of Systems: ROS All Other ROS was negative, except mentioned above   Past Medical History:  Diagnosis Date   Chronic back pain    CKD (chronic kidney disease) stage 2, GFR 60-89 ml/min    DVT (deep venous thrombosis)    Hypertension    Lumbar stenosis    Morbid obesity    Pulmonary  embolism    a. diagnosed in 08/2016. Started on Eliquis       Scheduled Meds:  acetaminophen  1,000 mg Oral Q6H   allopurinol  200 mg Oral Daily   docusate sodium  100 mg Oral BID   enoxaparin (LOVENOX) injection  40 mg Subcutaneous Q24H   metoprolol tartrate  25 mg Oral Daily   torsemide  100 mg Oral Daily   Continuous Infusions:  piperacillin-tazobactam (ZOSYN)  IV 3.375 g (11/01/22 0947)   PRN Meds:.alum & mag hydroxide-simeth, methocarbamol, morphine injection, ondansetron **OR** ondansetron (ZOFRAN) IV, oxyCODONE, polyethylene glycol   OBJECTIVE: Blood pressure (!) 149/87, pulse 69, temperature 97.8 F  (36.6 C), temperature source Oral, resp. rate 17, height  (1.6 m), weight 126 kg, last menstrual period 09/24/2022, SpO2 97 %.  Physical Exam  General/constitutional: no distress, pleasant HEENT: Normocephalic, PER, Conj Clear, EOMI, Oropharynx clear Neck supple CV: rrr no mrg Lungs: clear to auscultation, normal respiratory effort Abd: Soft, Nontender Ext: no edema Skin: No Rash Neuro: nonfocal Gu: I reviewed picture of wound today from gen surgery team -- wound clean no purulence MSK: no peripheral joint swelling/tenderness/warmth; back spines nontender   Lab Results Lab Results  Component Value Date   WBC 20.6 (H) 11/01/2022   HGB 11.5 (L) 11/01/2022   HCT 35.3 (L) 11/01/2022   MCV 93.1 11/01/2022   PLT 315 11/01/2022    Lab Results  Component Value Date   CREATININE 2.12 (H) 10/29/2022   BUN 37 (H) 10/29/2022   NA 137 10/29/2022   K 4.1 10/29/2022   CL 107 10/29/2022   CO2 21 (L) 10/29/2022    Lab Results  Component Value Date   ALT 100 (H) 03/24/2021   AST 87 (H) 03/24/2021   ALKPHOS 97 03/24/2021   BILITOT 1.2 03/24/2021      Microbiology: Recent Results (from the past 240 hour(s))  Aerobic/Anaerobic Culture w Gram Stain (surgical/deep wound)     Status: None   Collection Time: 10/27/22  1:06 PM   Specimen: Abscess  Result Value Ref Range Status   Specimen Description   Final    ABSCESS  PERI RECTAL Performed at [facility], [address], [place], [place] [address]    Special Requests   Final    NONE Performed at [facility], [address], [place], [place] [address]    Gram Stain   Final    NO WBC SEEN FEW GRAM POSITIVE COCCI MODERATE GRAM NEGATIVE RODS Performed at [facility], [address], [place], [place] [address]    Culture   Final    FEW ESCHERICHIA COLI ABUNDANT ACTINOMYCES SPECIES Standardized susceptibility testing for this organism is not available. MIXED ANAEROBIC FLORA  PRESENT.  CALL LAB IF FURTHER IID REQUIRED.    Report Status 11/01/2022 FINAL  Final   Organism ID, Bacteria ESCHERICHIA COLI  Final      Susceptibility   Escherichia coli - MIC*    AMPICILLIN >=32 RESISTANT Resistant     CEFEPIME <=0.12 SENSITIVE Sensitive     CEFTAZIDIME <=1 SENSITIVE Sensitive     CEFTRIAXONE <=0.25 SENSITIVE Sensitive     CIPROFLOXACIN <=0.25 SENSITIVE Sensitive     GENTAMICIN <=1 SENSITIVE Sensitive     IMIPENEM <=0.25 SENSITIVE Sensitive     TRIMETH/SULFA <=20 SENSITIVE Sensitive     AMPICILLIN/SULBACTAM >=32 RESISTANT Resistant     PIP/TAZO <=4 SENSITIVE Sensitive     * FEW ESCHERICHIA COLI  Surgical  pcr screen     Status: None   Collection Time: 10/29/22  2:15 AM   Specimen: Nasal Mucosa; Nasal Swab  Result Value Ref Range Status   MRSA, PCR NEGATIVE NEGATIVE Final   Staphylococcus aureus NEGATIVE NEGATIVE Final    Comment: (NOTE) The Xpert SA Assay (FDA approved for NASAL specimens in patients 20 years of age and older), is one component of a comprehensive surveillance program. It is not intended to diagnose infection nor to guide or monitor treatment. Performed at [facility], [address] [place], [place] [address]      Serology:    Imaging: If present, new imagings (plain films, ct scans, and mri) have been personally visualized and interpreted; radiology reports have been reviewed. Decision making incorporated into the Impression / Recommendations.  4/17 abd pelv ct with contrast FINDINGS: Lower chest: Mild subpleural scarring in the right lower lobe.   Hepatobiliary: Unenhanced liver is unremarkable.   Gallbladder is unremarkable. No intrahepatic or extrahepatic duct dilatation.   Pancreas: Within normal limits.   Spleen: Within normal limits.   Adrenals/Urinary Tract: 2.7 cm left adrenal nodule, measuring 35 HUs and therefore indeterminate, but unchanged from 2020 (when remeasured), and therefore compatible  with a benign adrenal adenoma.   Right adrenal gland is within normal limits.   Kidneys are within normal limits. No renal calculi or hydronephrosis.   Bladder is within normal limits.   Stomach/Bowel: Stomach is within normal limits.   No evidence of bowel obstruction.   Normal appendix (series 2/image 44).   No colonic wall thickening or inflammatory changes.   Vascular/Lymphatic: No evidence of abdominal aortic aneurysm.   No suspicious abdominopelvic lymphadenopathy.   Reproductive: Uterine fibroids.   Bilateral ovaries are within normal limits.   Other: No abdominopelvic ascites.   Musculoskeletal: Mild soft tissue gas in the left medial thigh/perineal region (series 3/image 7) with mild subcutaneous thickening/stranding in the left gluteal region with underlying stranding (series 3/image 14), suggesting cellulitis, without drainable fluid collection/abscess.   Status post PLIF at L4-5. Mild degenerative changes of the lower thoracic spine.   IMPRESSION: Left gluteal cellulitis, without drainable fluid collection/abscess.   Mild soft tissue gas in the left medial thigh/perineal region suggests recent intervention. If the patient has not had intervention, this raises concern for early gangrenous infection.   Additional ancillary findings as above.  Raymondo Band, MD [facility] [phone] pager    11/01/2022, 1:50 PM

## 2022-11-01 NOTE — TOC Progression Note (Signed)
Transition of Care [facility]) - Progression Note    Patient Details  Name: Caitlin Taylor MRN: [id number] Date of Birth: Feb 17, 1971  Transition of Care [facility]) CM/SW Contact  Beckie Busing, RN Phone [phone]  11/01/2022, 4:06 PM  Clinical Narrative:    [facility] consulted for home health DME needs. CM at bedside to discuss needs with patient. Patient states that she does not have a trainable caregiver which will exclude her from being accepted for home health. Please note that Home health agency will not take ownership of wounds and dressing changes. Patient must have trainable caregiver. HH will reinforce teaching and visit 2-3 times per week.         Expected Discharge Plan and Services                                               Social Determinants of Health (SDOH) Interventions SDOH Screenings   Food Insecurity: No Food Insecurity (10/27/2022)  Housing: Low Risk  (10/27/2022)  Transportation Needs: No Transportation Needs (10/27/2022)  Utilities: Not At Risk (10/27/2022)  Tobacco Use: Low Risk  (10/30/2022)    Readmission Risk Interventions     No data to display

## 2022-11-01 NOTE — Progress Notes (Signed)
3 Days Post-Op   Subjective/Chief Complaint: No new complaints. Last dressing change last night went well.   Objective: Vital signs in last 24 hours: Temp:  [97.8 F (36.6 C)-98.3 F (36.8 C)] 97.8 F (36.6 C) (04/22 0549) Pulse Rate:  [61-65] 65 (04/22 0549) Resp:  [16-18] 17 (04/22 0549) BP: (122-163)/(79-87) 163/79 (04/22 0549) SpO2:  [97 %-100 %] 97 % (04/22 0549) Last BM Date : 10/30/22  Intake/Output from previous day: 04/21 0701 - 04/22 0700 In: 1470 [P.O.:720; I.V.:700; IV Piggyback:50] Out: 2000 [Urine:2000] Intake/Output this shift: No intake/output data recorded.  General appearance: alert and cooperative Incision/Wound: wound with start of granulation tissue. Serous drainage on bandage. No surrounding erythema or induration. Dressing/packing changed by me this am    Lab Results:  Recent Labs    11/01/22 0604  WBC 20.6*  HGB 11.5*  HCT 35.3*  PLT 315    BMET No results for input(s): "NA", "K", "CL", "CO2", "GLUCOSE", "BUN", "CREATININE", "CALCIUM" in the last 72 hours.  PT/INR No results for input(s): "LABPROT", "INR" in the last 72 hours. ABG No results for input(s): "PHART", "HCO3" in the last 72 hours.  Invalid input(s): "PCO2", "PO2"  Studies/Results: No results found.  Anti-infectives: Anti-infectives (From admission, onward)    Start     Dose/Rate Route Frequency Ordered Stop   10/27/22 1000  piperacillin-tazobactam (ZOSYN) IVPB 3.375 g        3.375 g 12.5 mL/hr over 240 Minutes Intravenous Every 8 hours 10/27/22 0458     10/27/22 0300  vancomycin (VANCOCIN) IVPB 1000 mg/200 mL premix        1,000 mg 200 mL/hr over 60 Minutes Intravenous  Once 10/27/22 0256 10/27/22 0603   10/27/22 0300  piperacillin-tazobactam (ZOSYN) IVPB 3.375 g        3.375 g 100 mL/hr over 30 Minutes Intravenous  Once 10/27/22 0256 10/27/22 0406   10/27/22 0300  clindamycin (CLEOCIN) IVPB 600 mg        600 mg 100 mL/hr over 30 Minutes Intravenous  Once 10/27/22  0256 10/27/22 0448       Assessment/Plan: s/p Procedure(s): RECTAL EXAM UNDER ANESTHESIA; INCISION AND DEBRIDEMENT OF PERI-RECTAL ABSCESS; DRESSING CHANGE (N/A)  POD 5/3 s/p incision and debridement perirectal abscess 4/17, 4/19 Dr. Carolynne Edouard - WBC 20.6 (24.3). cx with e coli - resistant to ampicillin. Con't abx and transition to PO on dc -dressing changes at the bedside, may need HH for dressing changes - TOC consult  -OK to shower -mobilize -home in next 1-2d  FEN: soft ID: zosyn 4/16>> VTE: lovenox    LOS: 5 days    Eric Form, [facility] Surgery 11/01/2022, 8:27 AM Please see Amion for pager number during day hours 7:00am-4:30pm

## 2022-11-01 NOTE — Progress Notes (Signed)
PHARMACY CONSULT NOTE FOR:  OUTPATIENT  PARENTERAL ANTIBIOTIC THERAPY (OPAT)  Indication: Perirectal abscess/actinomyces Regimen: Ceftriaxone 2 gm IV Q 24 hours + Metronidazole 500 mg PO BID  End date: 11/11/22   Then patient will start Augmentin 875 mg BID on 11/12/22   IV antibiotic discharge orders are pended. To discharging provider:  please sign these orders via discharge navigator,  Select New Orders & click on the button choice - Manage This Unsigned Work.     Thank you for allowing pharmacy to be a part of this patient's care.  Sharin Mons, PharmD, BCPS, BCIDP Infectious Diseases Clinical Pharmacist Phone: [phone] 11/01/2022, 2:46 PM

## 2022-11-02 ENCOUNTER — Inpatient Hospital Stay (HOSPITAL_COMMUNITY): Payer: Managed Care, Other (non HMO)

## 2022-11-02 HISTORY — PX: IR FLUORO GUIDE CV LINE RIGHT: IMG2283

## 2022-11-02 HISTORY — PX: IR US GUIDE VASC ACCESS RIGHT: IMG2390

## 2022-11-02 LAB — CBC
HCT: 32.5 % — ABNORMAL LOW (ref 36.0–46.0)
Hemoglobin: 10.7 g/dL — ABNORMAL LOW (ref 12.0–15.0)
MCH: 30.2 pg (ref 26.0–34.0)
MCHC: 32.9 g/dL (ref 30.0–36.0)
MCV: 91.8 fL (ref 80.0–100.0)
Platelets: 365 10*3/uL (ref 150–400)
RBC: 3.54 MIL/uL — ABNORMAL LOW (ref 3.87–5.11)
RDW: 15.7 % — ABNORMAL HIGH (ref 11.5–15.5)
WBC: 18.6 10*3/uL — ABNORMAL HIGH (ref 4.0–10.5)
nRBC: 0.4 % — ABNORMAL HIGH (ref 0.0–0.2)

## 2022-11-02 MED ORDER — LIDOCAINE-EPINEPHRINE 1 %-1:100000 IJ SOLN
20.0000 mL | Freq: Once | INTRAMUSCULAR | Status: AC
  Administered 2022-11-02: 10 mL via INTRADERMAL
  Filled 2022-11-02: qty 20

## 2022-11-02 MED ORDER — LIDOCAINE-EPINEPHRINE 1 %-1:100000 IJ SOLN
INTRAMUSCULAR | Status: AC
  Filled 2022-11-02: qty 1

## 2022-11-02 NOTE — Progress Notes (Addendum)
4 Days Post-Op   Subjective/Chief Complaint: Continues to tolerate dressing changes. Last change early this am. Nausea with dilaudid yesterday and switched to morphine for breakthrough pain control  Objective: Vital signs in last 24 hours: Temp:  [97.9 F (36.6 C)-98.2 F (36.8 C)] 97.9 F (36.6 C) (04/23 0527) Pulse Rate:  [69-76] 76 (04/23 0527) Resp:  [14-17] 14 (04/23 0527) BP: (144-163)/(75-92) 163/75 (04/23 0527) SpO2:  [94 %-98 %] 94 % (04/23 0527) Last BM Date : 10/31/22  Intake/Output from previous day: 04/22 0701 - 04/23 0700 In: 894.5 [P.O.:711; IV Piggyback:183.5] Out: 2750 [Urine:2750] Intake/Output this shift: No intake/output data recorded.  General appearance: alert and cooperative Incision/Wound: wound with dressing in place with some serous drainage. Packing in place.   Lab Results:  Recent Labs    11/01/22 0604 11/02/22 0721  WBC 20.6* 18.6*  HGB 11.5* 10.7*  HCT 35.3* 32.5*  PLT 315 365    BMET No results for input(s): "NA", "K", "CL", "CO2", "GLUCOSE", "BUN", "CREATININE", "CALCIUM" in the last 72 hours.  PT/INR No results for input(s): "LABPROT", "INR" in the last 72 hours. ABG No results for input(s): "PHART", "HCO3" in the last 72 hours.  Invalid input(s): "PCO2", "PO2"  Studies/Results: No results found.  Anti-infectives: Anti-infectives (From admission, onward)    Start     Dose/Rate Route Frequency Ordered Stop   11/12/22 0000  amoxicillin-clavulanate (AUGMENTIN) 875-125 MG tablet        1 tablet Oral 2 times daily 11/01/22 1459 12/12/22 2359   11/02/22 0000  metroNIDAZOLE (FLAGYL) 500 MG tablet        500 mg Oral Every 12 hours 11/01/22 1459 11/11/22 2359   11/01/22 2200  metroNIDAZOLE (FLAGYL) tablet 500 mg        500 mg Oral Every 12 hours 11/01/22 1445     11/01/22 1700  cefTRIAXone (ROCEPHIN) 2 g in sodium chloride 0.9 % 100 mL IVPB        2 g 200 mL/hr over 30 Minutes Intravenous Every 24 hours 11/01/22 1445      11/01/22 0000  cefTRIAXone (ROCEPHIN) IVPB        2 g Intravenous Every 24 hours 11/01/22 1459 11/11/22 2359   10/27/22 1000  piperacillin-tazobactam (ZOSYN) IVPB 3.375 g  Status:  Discontinued        3.375 g 12.5 mL/hr over 240 Minutes Intravenous Every 8 hours 10/27/22 0458 11/01/22 1445   10/27/22 0300  vancomycin (VANCOCIN) IVPB 1000 mg/200 mL premix        1,000 mg 200 mL/hr over 60 Minutes Intravenous  Once 10/27/22 0256 10/27/22 0603   10/27/22 0300  piperacillin-tazobactam (ZOSYN) IVPB 3.375 g        3.375 g 100 mL/hr over 30 Minutes Intravenous  Once 10/27/22 0256 10/27/22 0406   10/27/22 0300  clindamycin (CLEOCIN) IVPB 600 mg        600 mg 100 mL/hr over 30 Minutes Intravenous  Once 10/27/22 0256 10/27/22 0448       Assessment/Plan: s/p Procedure(s): RECTAL EXAM UNDER ANESTHESIA; INCISION AND DEBRIDEMENT OF PERI-RECTAL ABSCESS; DRESSING CHANGE (N/A)  POD 6/4 s/p incision and debridement perirectal abscess 4/17, 4/19 Dr. Carolynne Edouard - WBC 18.6 (20.6). cx with e coli and actinomyces - resistant to ampicillin. ID consulted and appreciate their assistance. She will need IV abx for 2 weeks (4/18-5/2) and then Augmentin x3 months with ID f/u -dressing changes at the bedside. She needs bid dressing changes at dc. TOC consulted. HH not feasible and  now needs IV abx at dc. Discussed likely need for SNF with patient and she thinks she may have a friend who can assist with dressing changes at home - she will need IV access for discharge. Due to CKD/GFR 28 will ask IR for possible tunneled cath -OK to shower -mobilize  FEN: soft ID: zosyn 4/16>4/22, rocephin/flagyl 4/22>> VTE: lovenox    LOS: 6 days    Eric Form, [facility] 11/02/2022, 8:12 AM Please see Amion for pager number during day hours 7:00am-4:30pm

## 2022-11-02 NOTE — Progress Notes (Signed)
Patient ID: Caitlin Taylor, female   DOB: Oct 04, 1970, 52 y.o.   MRN: [id number] Request received from [facility] for tunneled IJ PICC for OP antibiotic therapy in pt. Hx perirectal abscess. Elevated creatinine. Details/risks of procedure, incl but not limited to, internal bleeding, infection, injury to adjacent structures d/w pt with her understanding and consent. Tent scheduled for this afternoon.

## 2022-11-03 LAB — BASIC METABOLIC PANEL
Anion gap: 11 (ref 5–15)
BUN: 19 mg/dL (ref 6–20)
CO2: 28 mmol/L (ref 22–32)
Calcium: 7.4 mg/dL — ABNORMAL LOW (ref 8.9–10.3)
Chloride: 101 mmol/L (ref 98–111)
Creatinine, Ser: 1.8 mg/dL — ABNORMAL HIGH (ref 0.44–1.00)
GFR, Estimated: 34 mL/min — ABNORMAL LOW (ref >60)
Glucose, Bld: 112 mg/dL — ABNORMAL HIGH (ref 70–99)
Potassium: 2.6 mmol/L — CL (ref 3.5–5.1)
Sodium: 140 mmol/L (ref 135–145)

## 2022-11-03 LAB — CBC
HCT: 31.6 % — ABNORMAL LOW (ref 36.0–46.0)
Hemoglobin: 10.4 g/dL — ABNORMAL LOW (ref 12.0–15.0)
MCH: 30.1 pg (ref 26.0–34.0)
MCHC: 32.9 g/dL (ref 30.0–36.0)
MCV: 91.6 fL (ref 80.0–100.0)
Platelets: 357 10*3/uL (ref 150–400)
RBC: 3.45 MIL/uL — ABNORMAL LOW (ref 3.87–5.11)
RDW: 15.8 % — ABNORMAL HIGH (ref 11.5–15.5)
WBC: 15.7 10*3/uL — ABNORMAL HIGH (ref 4.0–10.5)
nRBC: 0.4 % — ABNORMAL HIGH (ref 0.0–0.2)

## 2022-11-03 LAB — MAGNESIUM: Magnesium: 1.5 mg/dL — ABNORMAL LOW (ref 1.7–2.4)

## 2022-11-03 MED ORDER — MAGNESIUM SULFATE 2 GM/50ML IV SOLN
2.0000 g | Freq: Once | INTRAVENOUS | Status: AC
  Administered 2022-11-03: 2 g via INTRAVENOUS
  Filled 2022-11-03: qty 50

## 2022-11-03 MED ORDER — POTASSIUM CHLORIDE CRYS ER 20 MEQ PO TBCR: 40.0000 meq | EXTENDED_RELEASE_TABLET | ORAL | Status: DC

## 2022-11-03 MED ORDER — POTASSIUM CHLORIDE 20 MEQ PO PACK
40.0000 meq | PACK | Freq: Three times a day (TID) | ORAL | Status: DC
  Administered 2022-11-03 – 2022-11-04 (×4): 40 meq via ORAL
  Filled 2022-11-03 (×4): qty 2

## 2022-11-03 NOTE — Progress Notes (Signed)
Lab called for critical lab value- Potassium 2.6 No MD signed in, provider Irene Limbo & Admitter paged for critical lab value and orders.

## 2022-11-03 NOTE — Progress Notes (Signed)
5 Days Post-Op   Subjective/Chief Complaint: Some stomach upset last night but tolerating diet overall. Has been having soft bms but denies diarrhea  Objective: Vital signs in last 24 hours: Temp:  [98.2 F (36.8 C)-99.3 F (37.4 C)] 98.3 F (36.8 C) (04/24 0444) Pulse Rate:  [78-93] 93 (04/24 0444) Resp:  [16-18] 16 (04/24 0444) BP: (130-167)/(54-84) 167/73 (04/24 0444) SpO2:  [96 %-99 %] 96 % (04/24 0444) Last BM Date : 11/01/22  Intake/Output from previous day: 04/23 0701 - 04/24 0700 In: 240 [P.O.:240] Out: 600 [Urine:600] Intake/Output this shift: No intake/output data recorded.  General appearance: alert and cooperative Incision/Wound: wound with dressing in place with significant serous drainage. Dressing changed by me this am. More drainage on todays exam but wound still progressing with healthy bleeding and no significant surrounding erythema or induration. No purulent or necrotic tissue   Lab Results:  Recent Labs    11/02/22 0721 11/03/22 0603  WBC 18.6* 15.7*  HGB 10.7* 10.4*  HCT 32.5* 31.6*  PLT 365 357    BMET Recent Labs    11/03/22 0603  NA 140  K 2.6*  CL 101  CO2 28  GLUCOSE 112*  BUN 19  CREATININE 1.80*  CALCIUM 7.4*    PT/INR No results for input(s): "LABPROT", "INR" in the last 72 hours. ABG No results for input(s): "PHART", "HCO3" in the last 72 hours.  Invalid input(s): "PCO2", "PO2"  Studies/Results: IR Fluoro Guide CV Line Right  Result Date: 11/02/2022 INDICATION: 52 year old female with history of perirectal abscess requiring central venous access for long-term antibiotics. Tunneled central line indicated due to poor renal function. EXAM: 1. Ultrasound-guided venipuncture of the jugular vein 2. Fluoroscopic guided placement of tunneled central venous catheter MEDICATIONS: None. ANESTHESIA/SEDATION: Local anesthesia only. FLUOROSCOPY TIME:  One mGy COMPLICATIONS: None immediate. PROCEDURE: Informed written consent was obtained  from the patient after a discussion of the risks, benefits, and alternatives to treatment. Questions regarding the procedure were encouraged and answered. The right neck and chest were prepped with chlorhexidine in a sterile fashion, and a sterile drape was applied covering the operative field. Maximum barrier sterile technique with sterile gowns and gloves were used for the procedure. A timeout was performed prior to the initiation of the procedure. After creating a small venotomy incision, a 21 gauge micropuncture kit was utilized to access the internal jugular vein. Real-time ultrasound guidance was utilized for vascular access including the acquisition of a permanent ultrasound image documenting patency of the accessed vessel. A Mandril wire to the level of the cavoatrial junction. A 5 French, dual-lumen tunneled central venous catheter measuring 24 cm from tip to cuff was tunneled in a retrograde fashion from the anterior chest wall to the venotomy incision. A peel-away sheath was placed over the wire. The catheter was then placed through the peel-away sheath with the catheter tip ultimately positioned at the cavoatrial junction. Final catheter positioning was confirmed and documented with a spot radiographic image. The catheter aspirates and flushes normally. The catheter was flushed with appropriate volume heparin dwells. The catheter exit site was secured with a 0 silk retention suture. The venotomy incision was closed with Dermabond. Sterile dressings were applied. The patient tolerated the procedure well without immediate post procedural complication. IMPRESSION: Successful placement of 24 cm tip to cuff tunneled central venous catheter via the right internal jugular vein with catheter tip terminating at the cavoatrial junction. The catheter is ready for immediate use. Marliss Coots, MD [facility]  [facility] Electronically Signed   By: Marliss Coots M.D.    On: 11/02/2022 14:57    Anti-infectives: Anti-infectives (From admission, onward)    Start     Dose/Rate Route Frequency Ordered Stop   11/12/22 0000  amoxicillin-clavulanate (AUGMENTIN) 875-125 MG tablet        1 tablet Oral 2 times daily 11/01/22 1459 12/12/22 2359   11/02/22 0000  metroNIDAZOLE (FLAGYL) 500 MG tablet        500 mg Oral Every 12 hours 11/01/22 1459 11/11/22 2359   11/01/22 2200  metroNIDAZOLE (FLAGYL) tablet 500 mg        500 mg Oral Every 12 hours 11/01/22 1445     11/01/22 1700  cefTRIAXone (ROCEPHIN) 2 g in sodium chloride 0.9 % 100 mL IVPB        2 g 200 mL/hr over 30 Minutes Intravenous Every 24 hours 11/01/22 1445     11/01/22 0000  cefTRIAXone (ROCEPHIN) IVPB        2 g Intravenous Every 24 hours 11/01/22 1459 11/11/22 2359   10/27/22 1000  piperacillin-tazobactam (ZOSYN) IVPB 3.375 g  Status:  Discontinued        3.375 g 12.5 mL/hr over 240 Minutes Intravenous Every 8 hours 10/27/22 0458 11/01/22 1445   10/27/22 0300  vancomycin (VANCOCIN) IVPB 1000 mg/200 mL premix        1,000 mg 200 mL/hr over 60 Minutes Intravenous  Once 10/27/22 0256 10/27/22 0603   10/27/22 0300  piperacillin-tazobactam (ZOSYN) IVPB 3.375 g        3.375 g 100 mL/hr over 30 Minutes Intravenous  Once 10/27/22 0256 10/27/22 0406   10/27/22 0300  clindamycin (CLEOCIN) IVPB 600 mg        600 mg 100 mL/hr over 30 Minutes Intravenous  Once 10/27/22 0256 10/27/22 0448       Assessment/Plan: s/p Procedure(s): RECTAL EXAM UNDER ANESTHESIA; INCISION AND DEBRIDEMENT OF PERI-RECTAL ABSCESS; DRESSING CHANGE (N/A)  POD 7/5 s/p incision and debridement perirectal abscess 4/17, 4/19 Dr. Carolynne Edouard - WBC 15.7 (18.6). cx with e coli and actinomyces - resistant to ampicillin. ID consulted and appreciate their assistance. She will need IV abx for 2 weeks (4/18-5/2) and then Augmentin x3 months with ID f/u -dressing changes at the bedside. She needs bid dressing changes at dc. TOC consulted. HH not feasible  and now needs IV abx at dc. Discussed likely need for SNF with patient and she thinks she may have a friend who can assist with dressing changes at home - IR placed tunneled cath yesterday -OK to shower -mobilize  - hypokalemia - replete PO. Hypomagnesemia - replete IV.  Cardiac monitoring. Recheck am  FEN: soft ID: zosyn 4/16>4/22, rocephin/flagyl 4/22>> VTE: lovenox  CKD stage 3b H/o DVT/PE HTN Gout   LOS: 7 days    Eric Form, [facility] 11/03/2022, 8:06 AM Please see Amion for pager number during day hours 7:00am-4:30pm

## 2022-11-03 NOTE — TOC Progression Note (Addendum)
Transition of Care [facility]) - Progression Note    Patient Details  Name: Caitlin Taylor MRN: [id number] Date of Birth: [birth year]  Transition of Care [facility]) CM/SW Contact  Beckie Busing, RN Phone Number:[phone]  11/03/2022, 11:24 AM  Clinical Narrative:    CM at bedside to discuss disposition planning for wound care and IV antibiotics. Patient is on phone with friend that states that she will assist with wound care and home IV antibiotics. Pam with [facility] has been consulted  for home antibiotic therapy. Message has been sent to [facility]. TOC will continue to follow.   1134 Home health referral info has been sent to [facility]. Awaiting response.   1151 [facility] can not accept referral  1202 Home Health referral submitted to [facility] but they do not accept [employer].   1530 HH referrals have been sent to [facility] awaiting response  1539 [facility] can not offer HH services.   1540 Referral for [facility] has been sent to [facility]. [facility] can not accept referral.   1543 Referral sent to [facility] for [facility] services. [facility] can not accept referral.   1547 HH referral sent to [facility] home health.         Expected Discharge Plan and Services                                               Social Determinants of Health (SDOH) Interventions SDOH Screenings   Food Insecurity: No Food Insecurity (10/27/2022)  Housing: Low Risk  (10/27/2022)  Transportation Needs: No Transportation Needs (10/27/2022)  Utilities: Not At Risk (10/27/2022)  Tobacco Use: Low Risk  (11/03/2022)    Readmission Risk Interventions     No data to display

## 2022-11-03 NOTE — Progress Notes (Signed)
Mobility Specialist - Progress Note   11/03/22 1422  Mobility  Activity Ambulated with assistance in hallway  Level of Assistance Standby assist, set-up cues, supervision of patient - no hands on  Assistive Device None  Distance Ambulated (ft) 180 ft  Activity Response Tolerated well  Mobility Referral Yes  $Mobility charge 1 Mobility   Pt received in bed and agreed to mobility, had one instance of LOB, self corrected. Pt returned to bed with all needs met.  Marilynne Halsted Mobility Specialist

## 2022-11-04 LAB — CBC
HCT: 32.5 % — ABNORMAL LOW (ref 36.0–46.0)
Hemoglobin: 10.7 g/dL — ABNORMAL LOW (ref 12.0–15.0)
MCH: 30 pg (ref 26.0–34.0)
MCHC: 32.9 g/dL (ref 30.0–36.0)
MCV: 91 fL (ref 80.0–100.0)
Platelets: 396 10*3/uL (ref 150–400)
RBC: 3.57 MIL/uL — ABNORMAL LOW (ref 3.87–5.11)
RDW: 15.9 % — ABNORMAL HIGH (ref 11.5–15.5)
WBC: 15.9 10*3/uL — ABNORMAL HIGH (ref 4.0–10.5)
nRBC: 0.3 % — ABNORMAL HIGH (ref 0.0–0.2)

## 2022-11-04 LAB — BASIC METABOLIC PANEL
Anion gap: 11 (ref 5–15)
BUN: 19 mg/dL (ref 6–20)
CO2: 30 mmol/L (ref 22–32)
Calcium: 7.9 mg/dL — ABNORMAL LOW (ref 8.9–10.3)
Chloride: 103 mmol/L (ref 98–111)
Creatinine, Ser: 1.72 mg/dL — ABNORMAL HIGH (ref 0.44–1.00)
GFR, Estimated: 36 mL/min — ABNORMAL LOW (ref >60)
Glucose, Bld: 104 mg/dL — ABNORMAL HIGH (ref 70–99)
Potassium: 3.4 mmol/L — ABNORMAL LOW (ref 3.5–5.1)
Sodium: 144 mmol/L (ref 135–145)

## 2022-11-04 MED ORDER — TRAMADOL HCL 50 MG PO TABS: 50.0000 mg | ORAL_TABLET | Freq: Four times a day (QID) | ORAL | 0 refills | Status: AC | PRN

## 2022-11-04 MED ORDER — HEPARIN SOD (PORK) LOCK FLUSH 100 UNIT/ML IV SOLN
500.0000 [IU] | Freq: Once | INTRAVENOUS | Status: AC
  Administered 2022-11-04: 500 [IU] via INTRAVENOUS
  Filled 2022-11-04: qty 5

## 2022-11-04 MED ORDER — DOCUSATE SODIUM 100 MG PO CAPS: 100.0000 mg | ORAL_CAPSULE | Freq: Two times a day (BID) | ORAL | Status: AC | PRN

## 2022-11-04 NOTE — Discharge Instructions (Signed)
WOUND CARE: - dressing to be changed twice daily - supplies: sterile saline, kerlix, scissors, ABD pads, tape  - remove dressing and all packing carefully, moistening with sterile saline as needed to avoid packing/internal dressing sticking to the wound. - clean edges of skin around the wound with water/gauze, making sure there is no tape debris or leakage left on skin that could cause skin irritation or breakdown. - dampen and clean kerlix with sterile saline and pack wound from wound base to skin level, making sure to take note of any possible areas of wound tracking, tunneling and packing appropriately. Wound can be packed loosely. Trim kerlix to size if a whole kerlix is not required. - cover wound with a dry ABD pad and secure with tape or mesh underwear.  - change dressing as needed if leakage occurs, wound gets contaminated, or you want to shower. - you may shower daily with wound open and following the shower the wound should be dried and a clean dressing placed.

## 2022-11-04 NOTE — Progress Notes (Signed)
Per Carl Best, PA with general surgery, PICC line is to stay in place upon discharge for use of IV abx.

## 2022-11-04 NOTE — TOC Transition Note (Signed)
Transition of Care [facility]) - CM/SW Discharge Note   Patient Details  Name: Caitlin Taylor MRN: [id number] Date of Birth: 10/03/1970  Transition of Care [facility]) CM/SW Contact:  Beckie Busing, RN Phone [phone]  11/04/2022, 12:51 PM   Clinical Narrative:    Patient  is discharging home today. CM was consulted for transportation. CM at bedside  and patient states that she has transportation to pick her up. Home infusions have been set up with [facility] and everything is ok to move forward. [facility] will follow for [facility] RN for IV abx and wound care.No other needs noted at this time.         Patient Goals and CMS Choice      Discharge Placement                         Discharge Plan and Services Additional resources added to the After Visit Summary for                                       Social Determinants of Health (SDOH) Interventions SDOH Screenings   Food Insecurity: No Food Insecurity (10/27/2022)  Housing: Low Risk  (10/27/2022)  Transportation Needs: No Transportation Needs (10/27/2022)  Utilities: Not At Risk (10/27/2022)  Tobacco Use: Low Risk  (11/03/2022)     Readmission Risk Interventions     No data to display

## 2022-11-04 NOTE — Discharge Summary (Addendum)
[name] Discharge Summary   Patient ID: Caitlin Taylor MRN: [id number] DOB/AGE: [birth year] 52 y.o.  Admit date: 10/26/2022 Discharge date: 11/04/2022  Admitting Diagnosis: Perirectal abscess [K61.1]   Discharge Diagnosis Perirectal abscess [K61.1]   Consultants Infectious Disease Interventional Radiology   Imaging: IR US Guide Vasc Access Right  Result Date: 11/03/2022 INDICATION: 52 year old female with history of perirectal abscess requiring central venous access for long-term antibiotics. Tunneled central line indicated due to poor renal function. EXAM: 1. Ultrasound-guided venipuncture of the jugular vein 2. Fluoroscopic guided placement of tunneled central venous catheter MEDICATIONS: None. ANESTHESIA/SEDATION: Local anesthesia only. FLUOROSCOPY TIME:  One mGy COMPLICATIONS: None immediate. PROCEDURE: Informed written consent was obtained from the patient after a discussion of the risks, benefits, and alternatives to treatment. Questions regarding the procedure were encouraged and answered. The right neck and chest were prepped with chlorhexidine in a sterile fashion, and a sterile drape was applied covering the operative field. Maximum barrier sterile technique with sterile gowns and gloves were used for the procedure. A timeout was performed prior to the initiation of the procedure. After creating a small venotomy incision, a 21 gauge micropuncture kit was utilized to access the internal jugular vein. Real-time ultrasound guidance was utilized for vascular access including the acquisition of a permanent ultrasound image documenting patency of the accessed vessel. A Mandril wire to the level of the cavoatrial junction. A 5 French, dual-lumen tunneled central venous catheter measuring 24 cm from tip to cuff was tunneled in a retrograde fashion from the anterior chest wall to the venotomy incision. A peel-away sheath was placed over the wire. The catheter was then placed  through the peel-away sheath with the catheter tip ultimately positioned at the cavoatrial junction. Final catheter positioning was confirmed and documented with a spot radiographic image. The catheter aspirates and flushes normally. The catheter was flushed with appropriate volume heparin dwells. The catheter exit site was secured with a 0 silk retention suture. The venotomy incision was closed with Dermabond. Sterile dressings were applied. The patient tolerated the procedure well without immediate post procedural complication. IMPRESSION: Successful placement of 24 cm tip to cuff tunneled central venous catheter via the right internal jugular vein with catheter tip terminating at the cavoatrial junction. The catheter is ready for immediate use. Marliss Coots, MD [facility] Radiology Electronically Signed   By: Marliss Coots M.D.   On: 11/03/2022 10:08   IR Fluoro Guide CV Line Right  Result Date: 11/02/2022 INDICATION: 52 year old female with history of perirectal abscess requiring central venous access for long-term antibiotics. Tunneled central line indicated due to poor renal function. EXAM: 1. Ultrasound-guided venipuncture of the jugular vein 2. Fluoroscopic guided placement of tunneled central venous catheter MEDICATIONS: None. ANESTHESIA/SEDATION: Local anesthesia only. FLUOROSCOPY TIME:  One mGy COMPLICATIONS: None immediate. PROCEDURE: Informed written consent was obtained from the patient after a discussion of the risks, benefits, and alternatives to treatment. Questions regarding the procedure were encouraged and answered. The right neck and chest were prepped with chlorhexidine in a sterile fashion, and a sterile drape was applied covering the operative field. Maximum barrier sterile technique with sterile gowns and gloves were used for the procedure. A timeout was performed prior to the initiation of the procedure. After creating a small venotomy  incision, a 21 gauge micropuncture kit was utilized to access the internal jugular vein. Real-time ultrasound guidance was utilized for vascular access including the acquisition of a permanent ultrasound image documenting patency of the  accessed vessel. A Mandril wire to the level of the cavoatrial junction. A 5 French, dual-lumen tunneled central venous catheter measuring 24 cm from tip to cuff was tunneled in a retrograde fashion from the anterior chest wall to the venotomy incision. A peel-away sheath was placed over the wire. The catheter was then placed through the peel-away sheath with the catheter tip ultimately positioned at the cavoatrial junction. Final catheter positioning was confirmed and documented with a spot radiographic image. The catheter aspirates and flushes normally. The catheter was flushed with appropriate volume heparin dwells. The catheter exit site was secured with a 0 silk retention suture. The venotomy incision was closed with Dermabond. Sterile dressings were applied. The patient tolerated the procedure well without immediate post procedural complication. IMPRESSION: Successful placement of 24 cm tip to cuff tunneled central venous catheter via the right internal jugular vein with catheter tip terminating at the cavoatrial junction. The catheter is ready for immediate use. Marliss Coots, MD [facility] Radiology Electronically Signed   By: Marliss Coots M.D.   On: 11/02/2022 14:57    Procedures Dr. Carolynne Edouard (10/27/22) - rectal EUA, incision and debridement perirectal abscess Dr. Carolynne Edouard (10/29/22) - rectal EUA incision and debridement perirectal abscess  Hospital Course:  52 y.o. female who presented to the ED with buttock pain.  Workup showed perirectal abscess.  Patient was admitted and underwent procedures listed above.  Tolerated procedures well.  Diet was advanced as tolerated.  On POD8/6, the patient was voiding well, tolerating diet,  ambulating well, pain well controlled, vital signs stable, wound healing well and felt stable for discharge home. Wound cultures grew E coli and actinomyces - resistant to ampicillin. ID consulted and appreciate and she will need IV abx for 2 weeks (4/18-5/2) and then Augmentin x3 months with ID follow up.  WBC was monitored during admission and improved. A tunneled catheter was placed by IR during admission (PICC avoided given her CKD). Patient will follow up in our office in 3 weeks and knows to call with questions or concerns. Case management involved during admission and home health arranged for IV antibiotics and wound care. She was instructed to follow up with PCP as well.   I or a member of my team have reviewed this patient in the Controlled Substance Database.  Allergies as of 11/04/2022   No Known Allergies      Medication List     STOP taking these medications    doxycycline 100 MG capsule Commonly known as: VIBRAMYCIN   HYDROcodone-acetaminophen 5-325 MG tablet Commonly known as: NORCO/VICODIN   ondansetron 4 MG disintegrating tablet Commonly known as: Zofran ODT   predniSONE 10 MG (21) Tbpk tablet Commonly known as: STERAPRED UNI-PAK 21 TAB       TAKE these medications    acetaminophen 325 MG tablet Commonly known as: TYLENOL Take 2 tablets (650 mg total) by mouth every 6 (six) hours as needed for moderate pain.   allopurinol 100 MG tablet Commonly known as: ZYLOPRIM Take 200 mg by mouth daily.   amoxicillin-clavulanate 875-125 MG tablet Commonly known as: AUGMENTIN Take 1 tablet by mouth 2 (two) times daily. START taking 11/12/22 Start taking on: Nov 12, 2022   cefTRIAXone  IVPB Commonly known as: ROCEPHIN Inject 2 g into the vein daily for 10 days. Indication:  Peri-rectal abscess  First Dose: Yes Last Day of Therapy:  11/11/22 Labs - Once weekly:  CBC/D and BMP, Labs - Every other week:  ESR  and CRP Method of administration: IV Push Method of  administration may be changed at the discretion of home infusion pharmacist based upon assessment of the patient and/or caregiver's ability to self-administer the medication ordered.   docusate sodium 100 MG capsule Commonly known as: COLACE Take 1 capsule (100 mg total) by mouth 2 (two) times daily as needed for mild constipation or moderate constipation.   metoprolol tartrate 25 MG tablet Commonly known as: LOPRESSOR Take 0.5 tablets (12.5 mg total) by mouth 2 (two) times daily. What changed:  how much to take when to take this   metroNIDAZOLE 500 MG tablet Commonly known as: FLAGYL Take 1 tablet (500 mg total) by mouth every 12 (twelve) hours for 9 days. Stop date 11/11/22   Mitigare 0.6 MG Caps Generic drug: Colchicine Take 0.12 mg by mouth daily.   norethindrone 0.35 MG tablet Commonly known as: MICRONOR Take 1 tablet by mouth at bedtime.   polyethylene glycol 17 g packet Commonly known as: MIRALAX / GLYCOLAX Take 17 g by mouth daily as needed for mild constipation.   potassium chloride 10 MEQ tablet Commonly known as: KLOR-CON Take 1 tablet (10 mEq total) by mouth daily.   spironolactone 25 MG tablet Commonly known as: ALDACTONE Take 1 tablet (25 mg total) by mouth daily.   torsemide 100 MG tablet Commonly known as: DEMADEX Take 100 mg by mouth daily.   traMADol 50 MG tablet Commonly known as: Ultram Take 1 tablet (50 mg total) by mouth every 6 (six) hours as needed for up to 5 days.               Discharge Care Instructions  (From admission, onward)           Start     Ordered   11/01/22 0000  Change dressing on IV access line weekly and PRN  (Home infusion instructions - [facility] )        11/01/22 1459              Follow-up Information     Maczis, Hedda Slade, PA-C. Go on 11/23/2022.   Specialty: General Surgery Why: 11/23/2022 10:00 AM. Bring a copy of your photo ID & insurance card. Arrive 30 minutes prior to your  appointment for paperwork Contact information: [address] [place] [address] [phone]         [facility] Follow up.   Why: Your home health has been set up per [facility]. The office will contact you for start of service information. Contact information: [address], [place], [place] [address] Phone: [phone]        [facility] Follow up.   Why: Your home health RN has been set up with [facility]. The office will call you with start of service information. If you have any question please call number listed above. Contact information: [address], [place], [place] [address] Phone: [phone]        Raymondo Band, MD. Go on 11/23/2022.   Specialty: Infectious Diseases Contact information: [address] [place] [address] [phone]         Ralene Ok, MD. Schedule an appointment as soon as possible for a visit.   Specialty: Internal Medicine Why: to follow up after hospitalization and recheck electrolytes in approximately 1 week Contact information: [address] [place] [facility] [address] [phone]  Signed: Eric Form , [facility] 11/04/2022, 1:29 PM Please see Amion for pager number during day hours 7:00am-4:30pm

## 2022-11-04 NOTE — TOC Progression Note (Signed)
Transition of Care [facility]) - Progression Note    Patient Details  Name: Caitlin Taylor MRN: [id number] Date of Birth: [birth year]  [facility] CM/SW Contact  Beckie Busing, RN Phone Number:[phone]  11/04/2022, 8:25 AM  Clinical Narrative:    CM has verified with Pam at [facility] that [facility] can provide RN for wound  care and IV abx only. This will not include any type of PT/OT. RN only. Otherwise patient does not have agency willing to accept for home health.         Expected Discharge Plan and Services                                               Social Determinants of Health (SDOH) Interventions SDOH Screenings   Food Insecurity: No Food Insecurity (10/27/2022)  Housing: Low Risk  (10/27/2022)  Transportation Needs: No Transportation Needs (10/27/2022)  Utilities: Not At Risk (10/27/2022)  Tobacco Use: Low Risk  (11/03/2022)    Readmission Risk Interventions     No data to display

## 2022-11-23 ENCOUNTER — Other Ambulatory Visit: Payer: Self-pay

## 2022-11-23 ENCOUNTER — Encounter: Payer: Self-pay | Admitting: Internal Medicine

## 2022-11-23 ENCOUNTER — Ambulatory Visit (INDEPENDENT_AMBULATORY_CARE_PROVIDER_SITE_OTHER): Payer: Managed Care, Other (non HMO) | Admitting: Internal Medicine

## 2022-11-23 VITALS — BP 113/75 | HR 66 | Temp 97.3°F | Ht 63.0 in | Wt 260.0 lb

## 2022-11-23 DIAGNOSIS — A429 Actinomycosis, unspecified: Secondary | ICD-10-CM | POA: Diagnosis not present

## 2022-11-23 DIAGNOSIS — K611 Rectal abscess: Secondary | ICD-10-CM

## 2022-11-23 NOTE — Progress Notes (Signed)
[facility]  Patient Active Problem List   Diagnosis Date Noted   Perirectal abscess 10/27/2022   Acute renal failure superimposed on stage 3 chronic kidney disease (HCC)    Abscess 08/28/2018   Cellulitis of left groin 08/27/2018   Right leg DVT (HCC) 09/30/2016   Chronic diastolic CHF (congestive heart failure) (HCC) 09/22/2016   Edema 09/04/2016   History of pulmonary embolus (PE) 09/03/2016   Benign essential HTN    CKD (chronic kidney disease), stage III (HCC)    Lumbar stenosis    S/P lumbar fusion    Normocytic anemia    Constipation due to pain medication    Morbid obesity due to excess calories (HCC)    Essential hypertension 01/05/2016      Subjective:    Patient ID: Caitlin Taylor, female    DOB: [birth year], 52 y.o.   MRN: [id number]  Chief Complaint  Patient presents with   Follow-up    HPI:  Caitlin Taylor is a 52 y.o. female here for hospital f/u perirectal abscess with culture growing actino  She was admitted 10/26/22 I saw her inpatient She had I&D with a drain placement  11/23/22 id f/u She had follow up with gen surgery a week prior to this visit and penrose drain taken out at that time Perirectal pain is much better She has wound care and wound was packed this morning. No discharge was reported and wound looks clean She'll have follow with wound 01/04/23 She saw her surgery today -- "it looks good" per patient. Patient is released to go back to work end of this month She is currently taking augmentin No diarrhea, nausea Eating good and appetite is good   No Known Allergies    Outpatient Medications Prior to Visit  Medication Sig Dispense Refill   acetaminophen (TYLENOL) 325 MG tablet Take 2 tablets (650 mg total) by mouth every 6 (six) hours as needed for moderate pain.     allopurinol (ZYLOPRIM) 100 MG tablet Take 200 mg by mouth daily.     amoxicillin-clavulanate (AUGMENTIN) 875-125 MG tablet  Take 1 tablet by mouth 2 (two) times daily. START taking 11/12/22 60 tablet 0   metoprolol tartrate (LOPRESSOR) 25 MG tablet Take 0.5 tablets (12.5 mg total) by mouth 2 (two) times daily. (Patient taking differently: Take 25 mg by mouth daily.) 60 tablet 0   MITIGARE 0.6 MG CAPS Take 0.12 mg by mouth daily.     torsemide (DEMADEX) 100 MG tablet Take 100 mg by mouth daily.     docusate sodium (COLACE) 100 MG capsule Take 1 capsule (100 mg total) by mouth 2 (two) times daily as needed for mild constipation or moderate constipation. (Patient not taking: Reported on 11/23/2022)     norethindrone (MICRONOR,CAMILA,ERRIN) 0.35 MG tablet Take 1 tablet by mouth at bedtime. (Patient not taking: Reported on 11/23/2022)     polyethylene glycol (MIRALAX / GLYCOLAX) packet Take 17 g by mouth daily as needed for mild constipation. (Patient not taking: Reported on 10/27/2022) 14 each 0   potassium chloride (KLOR-CON) 10 MEQ tablet Take 1 tablet (10 mEq total) by mouth daily. 5 tablet 0   spironolactone (ALDACTONE) 25 MG tablet Take 1 tablet (25 mg total) by mouth daily. (Patient not taking: Reported on 10/27/2022)     No facility-administered medications prior to visit.     Social History   Socioeconomic History   Marital status: Divorced  Spouse name: Not on file   Number of children: Not on file   Years of education: Not on file   Highest education level: Not on file  Occupational History   Not on file  Tobacco Use   Smoking status: Some Days    Types: E-cigarettes   Smokeless tobacco: Never   Tobacco comments:    Vapes sometimes   Vaping Use   Vaping Use: Never used  Substance and Sexual Activity   Alcohol use: Not Currently    Comment: rare   Drug use: No   Sexual activity: Not on file  Other Topics Concern   Not on file  Social History Narrative   Not on file   Social Determinants of Health   Financial Resource Strain: Not on file  Food Insecurity: No Food Insecurity (10/27/2022)    Hunger Vital Sign    Worried About Running Out of Food in the Last Year: Never true    Ran Out of Food in the Last Year: Never true  Transportation Needs: No Transportation Needs (10/27/2022)   PRAPARE - Administrator, Civil Service (Medical): No    Lack of Transportation (Non-Medical): No  Physical Activity: Not on file  Stress: Not on file  Social Connections: Not on file  Intimate Partner Violence: Not At Risk (10/27/2022)   Humiliation, Afraid, Rape, and Kick questionnaire    Fear of Current or Ex-Partner: No    Emotionally Abused: No    Physically Abused: No    Sexually Abused: No      Review of Systems    All other ros negative  Objective:    BP 113/75   Pulse 66   Temp (!) 97.3 F (36.3 C) (Temporal)   Ht 5\' 3"  (1.6 m)   Wt 260 lb (117.9 kg)   LMP 09/24/2022 Comment: negative pregnancy test 10/29/22  SpO2 97%   BMI 46.06 kg/m  Nursing note and vital signs reviewed.  Physical Exam     General/constitutional: no distress, pleasant HEENT: Normocephalic, PER, Conj Clear, EOMI, Oropharynx clear Neck supple CV: rrr no mrg Lungs: clear to auscultation, normal respiratory effort Abd: Soft, Nontender Ext: no edema Skin: No Rash Neuro: nonfocal MSK: no peripheral joint swelling/tenderness/warmth; back spines nontender GU: deferred at her request as wound care was done this morning   Labs: Lab Results  Component Value Date   WBC 15.9 (H) 11/04/2022   HGB 10.7 (L) 11/04/2022   HCT 32.5 (L) 11/04/2022   MCV 91.0 11/04/2022   PLT 396 11/04/2022   Last metabolic panel Lab Results  Component Value Date   GLUCOSE 104 (H) 11/04/2022   NA 144 11/04/2022   K 3.4 (L) 11/04/2022   CL 103 11/04/2022   CO2 30 11/04/2022   BUN 19 11/04/2022   CREATININE 1.72 (H) 11/04/2022   GFRNONAA 36 (L) 11/04/2022   CALCIUM 7.9 (L) 11/04/2022   PHOS 4.0 09/30/2016   PROT 7.5 03/24/2021   ALBUMIN 4.5 03/24/2021   LABGLOB 3.1 09/24/2016   AGRATIO 0.9 09/24/2016    BILITOT 1.2 03/24/2021   ALKPHOS 97 03/24/2021   AST 87 (H) 03/24/2021   ALT 100 (H) 03/24/2021   ANIONGAP 11 11/04/2022    Micro:  Serology:  Imaging:  Assessment & Plan:   Problem List Items Addressed This Visit       Digestive   Perirectal abscess - Primary   Other Visit Diagnoses     Actinomycosis  No orders of the defined types were placed in this encounter.    51 yo female admitted with 1 week perianal pain found to have perirectal abscess    4/17 s/p I&D of perirectal abscess; cx actinomyces species, and non-esbl ecoli (resistant to amp-sulb; S piptazo, ceftriaxone)   Given the presence of actino, she might benefit from prolonged treatment of up to 3 months, now that she had I&D. No classic actino syndrome so unclear if truly need but would stay on long course and see if can deescalate early   Plan 2 more weeks iv abx and then 3 months oral abx  -------- 11/23/22 id assessment Patient clinically improving She had transitioned from initial 4 weeks iv abx to po amox-clav No abx side effect  Will see her again in 4-6 weeks with repeat blood tests. I suspect being such a superficial process we might not need to treat too long  My current plan is still around another 2-3 months of treatment  Follow-up: Return in about 4 weeks (around 12/21/2022).      Raymondo Band, MD [facility] 11/23/2022, 3:46 PM

## 2022-11-23 NOTE — Patient Instructions (Addendum)
Continue amoxicillin-clavulonate antibiotic twice a day   See me in 4-6 weeks to check labs   If things healed and labs look normal for the next few weeks I might stop antibiotics early at around another 3 months   Please sign up to mychart to send communication such as medication request to me

## 2022-12-11 ENCOUNTER — Other Ambulatory Visit: Payer: Self-pay | Admitting: Internal Medicine

## 2022-12-17 ENCOUNTER — Telehealth: Payer: Self-pay

## 2022-12-17 ENCOUNTER — Encounter: Payer: Self-pay | Admitting: Internal Medicine

## 2022-12-17 NOTE — Telephone Encounter (Signed)
Patient called office requesting work note stating she is able to come back to work on 6/10-6/11. Has appointment on 6/12 for follow up. Would like letter as soon as possible. [name], RMA

## 2022-12-20 NOTE — Telephone Encounter (Signed)
Was able to inform patient about letter. Requested letter be emailed to her supervisor.  Letter emailed to [email or web address] [name], RMA

## 2022-12-20 NOTE — Telephone Encounter (Signed)
Attempted to call patient to inform her letter is completed. Not able to reach her at this time. Voicemail is not setup. [name], RMA

## 2022-12-20 NOTE — Telephone Encounter (Signed)
Patient left voicemail stating employer would like to know if she is to return to work with restrictions or without. Per Dr. Renold Don that will need to come from her surgeon and herself.  Advised she call them today. Verbalized understanding. [name], RMA

## 2022-12-22 ENCOUNTER — Other Ambulatory Visit: Payer: Self-pay

## 2022-12-22 ENCOUNTER — Telehealth: Payer: Self-pay

## 2022-12-22 ENCOUNTER — Encounter: Payer: Self-pay | Admitting: Internal Medicine

## 2022-12-22 ENCOUNTER — Ambulatory Visit (INDEPENDENT_AMBULATORY_CARE_PROVIDER_SITE_OTHER): Payer: Managed Care, Other (non HMO) | Admitting: Internal Medicine

## 2022-12-22 VITALS — BP 138/82 | HR 84 | Temp 98.2°F | Wt 266.0 lb

## 2022-12-22 DIAGNOSIS — K611 Rectal abscess: Secondary | ICD-10-CM

## 2022-12-22 DIAGNOSIS — A429 Actinomycosis, unspecified: Secondary | ICD-10-CM | POA: Diagnosis not present

## 2022-12-22 NOTE — Progress Notes (Signed)
[facility]  Patient Active Problem List   Diagnosis Date Noted   Perirectal abscess 10/27/2022   Acute renal failure superimposed on stage 3 chronic kidney disease (HCC)    Abscess 08/28/2018   Cellulitis of left groin 08/27/2018   Right leg DVT (HCC) 09/30/2016   Chronic diastolic CHF (congestive heart failure) (HCC) 09/22/2016   Edema 09/04/2016   History of pulmonary embolus (PE) 09/03/2016   Benign essential HTN    CKD (chronic kidney disease), stage III (HCC)    Lumbar stenosis    S/P lumbar fusion    Normocytic anemia    Constipation due to pain medication    Morbid obesity due to excess calories (HCC)    Essential hypertension 01/05/2016      Subjective:    Patient ID: Caitlin Taylor, female    DOB: [birth year], 52 y.o.   MRN: [id number]  Chief Complaint  Patient presents with   Follow-up    HPI:  Caitlin Taylor is a 52 y.o. female here for hospital f/u perirectal abscess with culture growing actino  She was admitted 10/26/22 I saw her inpatient She had I&D with a drain placement  11/23/22 id f/u She had follow up with gen surgery a week prior to this visit and penrose drain taken out at that time Perirectal pain is much better She has wound care and wound was packed this morning. No discharge was reported and wound looks clean She'll have follow with wound 01/04/23 She saw her surgery today -- "it looks good" per patient. Patient is released to go back to work end of this month She is currently taking augmentin No diarrhea, nausea Eating good and appetite is good    ---------- 12/22/22 clinic f/u Doing well on amox Mild tenderness Needs paperwork for work filled out No pus Not packing any more No n/v/diarrhea with amox   No Known Allergies    Outpatient Medications Prior to Visit  Medication Sig Dispense Refill   allopurinol (ZYLOPRIM) 100 MG tablet Take 200 mg by mouth daily.      amoxicillin-clavulanate (AUGMENTIN) 875-125 MG tablet TAKE 1 TABLET BY MOUTH TWICE DAILY. START TAKING 11/12/22 60 tablet 1   MITIGARE 0.6 MG CAPS Take 0.12 mg by mouth daily.     torsemide (DEMADEX) 100 MG tablet Take 100 mg by mouth daily.     acetaminophen (TYLENOL) 325 MG tablet Take 2 tablets (650 mg total) by mouth every 6 (six) hours as needed for moderate pain. (Patient not taking: Reported on 12/22/2022)     docusate sodium (COLACE) 100 MG capsule Take 1 capsule (100 mg total) by mouth 2 (two) times daily as needed for mild constipation or moderate constipation. (Patient not taking: Reported on 11/23/2022)     metoprolol tartrate (LOPRESSOR) 25 MG tablet Take 0.5 tablets (12.5 mg total) by mouth 2 (two) times daily. (Patient not taking: Reported on 12/22/2022) 60 tablet 0   norethindrone (MICRONOR,CAMILA,ERRIN) 0.35 MG tablet Take 1 tablet by mouth at bedtime. (Patient not taking: Reported on 11/23/2022)     polyethylene glycol (MIRALAX / GLYCOLAX) packet Take 17 g by mouth daily as needed for mild constipation. (Patient not taking: Reported on 10/27/2022) 14 each 0   potassium chloride (KLOR-CON) 10 MEQ tablet Take 1 tablet (10 mEq total) by mouth daily. (Patient not taking: Reported on 12/22/2022) 5 tablet 0   spironolactone (ALDACTONE) 25 MG tablet Take 1 tablet (25 mg total) by  mouth daily. (Patient not taking: Reported on 10/27/2022)     No facility-administered medications prior to visit.     Social History   Socioeconomic History   Marital status: Divorced    Spouse name: Not on file   Number of children: Not on file   Years of education: Not on file   Highest education level: Not on file  Occupational History   Not on file  Tobacco Use   Smoking status: Some Days    Types: E-cigarettes   Smokeless tobacco: Never   Tobacco comments:    Vapes sometimes   Vaping Use   Vaping Use: Never used  Substance and Sexual Activity   Alcohol use: Not Currently    Comment: rare   Drug  use: No   Sexual activity: Not on file  Other Topics Concern   Not on file  Social History Narrative   Not on file   Social Determinants of Health   Financial Resource Strain: Not on file  Food Insecurity: No Food Insecurity (10/27/2022)   Hunger Vital Sign    Worried About Running Out of Food in the Last Year: Never true    Ran Out of Food in the Last Year: Never true  Transportation Needs: No Transportation Needs (10/27/2022)   PRAPARE - Administrator, Civil Service (Medical): No    Lack of Transportation (Non-Medical): No  Physical Activity: Not on file  Stress: Not on file  Social Connections: Not on file  Intimate Partner Violence: Not At Risk (10/27/2022)   Humiliation, Afraid, Rape, and Kick questionnaire    Fear of Current or Ex-Partner: No    Emotionally Abused: No    Physically Abused: No    Sexually Abused: No      Review of Systems    All other ros negative  Objective:    BP 138/82   Pulse 84   Temp 98.2 F (36.8 C) (Oral)   Wt 266 lb (120.7 kg)   SpO2 94%   BMI 47.12 kg/m  Nursing note and vital signs reviewed.  Physical Exam     General/constitutional: no distress, pleasant HEENT: Normocephalic, PER, Conj Clear, EOMI, Oropharynx clear Neck supple CV: rrr no mrg Lungs: clear to auscultation, normal respiratory effort Abd: Soft, Nontender Ext: no edema Skin/gu: left sided medial wound 3-4 inch in length -- clean granulating tissue; can't evaluate depth; mild tenderness; no fluctuance; no purulence    Labs: Lab Results  Component Value Date   WBC 15.9 (H) 11/04/2022   HGB 10.7 (L) 11/04/2022   HCT 32.5 (L) 11/04/2022   MCV 91.0 11/04/2022   PLT 396 11/04/2022   Last metabolic panel Lab Results  Component Value Date   GLUCOSE 104 (H) 11/04/2022   NA 144 11/04/2022   K 3.4 (L) 11/04/2022   CL 103 11/04/2022   CO2 30 11/04/2022   BUN 19 11/04/2022   CREATININE 1.72 (H) 11/04/2022   GFRNONAA 36 (L) 11/04/2022   CALCIUM  7.9 (L) 11/04/2022   PHOS 4.0 09/30/2016   PROT 7.5 03/24/2021   ALBUMIN 4.5 03/24/2021   LABGLOB 3.1 09/24/2016   AGRATIO 0.9 09/24/2016   BILITOT 1.2 03/24/2021   ALKPHOS 97 03/24/2021   AST 87 (H) 03/24/2021   ALT 100 (H) 03/24/2021   ANIONGAP 11 11/04/2022    Micro:  Serology:  Imaging:  Assessment & Plan:   Problem List Items Addressed This Visit       Digestive   Perirectal abscess -  Primary   Other Visit Diagnoses     Actinomycosis            No orders of the defined types were placed in this encounter.    52 yo female admitted with 1 week perianal pain found to have perirectal abscess    4/17 s/p I&D of perirectal abscess; cx actinomyces species, and non-esbl ecoli (resistant to amp-sulb; S piptazo, ceftriaxone)   Given the presence of actino, she might benefit from prolonged treatment of up to 3 months, now that she had I&D. No classic actino syndrome so unclear if truly need but would stay on long course and see if can deescalate early   Plan 2 more weeks iv abx and then 3 months oral abx  -------- 11/23/22 id assessment Patient clinically improving She had transitioned from initial 4 weeks iv abx to po amox-clav No abx side effect  Will see her again in 4-6 weeks with repeat blood tests. 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      Raymondo Band, MD [facility] 12/22/2022, 4:29 PM

## 2022-12-22 NOTE — Patient Instructions (Signed)
See me again in 4-6 weeks  Please continue to pack your wound unless your surgeon says otherwise  Continue amoxicillin and 2 more months planned

## 2022-12-22 NOTE — Telephone Encounter (Signed)
Patient called office to see if provider could assist with ADA forms. States that employer had some concerns and recommended ADA so she can work at her desk. Since returning to work patient has had to walk more to see clients. Is seeing about 15 a day. After work patient states that she is only able to sit in her car due. 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  Advised she Programmer, applications regarding this, but is not scheduled to see them until later this month. Needs this completed today per HR.  Will bring paperwork to appointment for provider to review.  [name], RMA

## 2023-02-03 ENCOUNTER — Encounter: Payer: Self-pay | Admitting: Internal Medicine

## 2023-02-03 ENCOUNTER — Ambulatory Visit (INDEPENDENT_AMBULATORY_CARE_PROVIDER_SITE_OTHER): Payer: Managed Care, Other (non HMO) | Admitting: Internal Medicine

## 2023-02-03 ENCOUNTER — Other Ambulatory Visit: Payer: Self-pay

## 2023-02-03 VITALS — BP 118/80 | HR 78 | Temp 98.0°F | Resp 16

## 2023-02-03 DIAGNOSIS — K611 Rectal abscess: Secondary | ICD-10-CM

## 2023-02-03 NOTE — Progress Notes (Signed)
[facility]  Patient Active Problem List   Diagnosis Date Noted   Perirectal abscess 10/27/2022   Acute renal failure superimposed on stage 3 chronic kidney disease (HCC)    Abscess 08/28/2018   Cellulitis of left groin 08/27/2018   Right leg DVT (HCC) 09/30/2016   Chronic diastolic CHF (congestive heart failure) (HCC) 09/22/2016   Edema 09/04/2016   History of pulmonary embolus (PE) 09/03/2016   Benign essential HTN    CKD (chronic kidney disease), stage III (HCC)    Lumbar stenosis    S/P lumbar fusion    Normocytic anemia    Constipation due to pain medication    Morbid obesity due to excess calories (HCC)    Essential hypertension 01/05/2016      Subjective:    Patient ID: Caitlin Taylor, female    DOB: 03-11-1971, 52 y.o.   MRN: [id number]  Chief Complaint  Patient presents with   Follow-up    Perirectal abscess      HPI:  Caitlin Taylor is a 52 y.o. female here for hospital f/u perirectal abscess with culture growing actino  She was admitted 10/26/22 I saw her inpatient She had I&D with a drain placement  11/23/22 id f/u She had follow up with gen surgery a week prior to this visit and penrose drain taken out at that time Perirectal pain is much better She has wound care and wound was packed this morning. No discharge was reported and wound looks clean She'll have follow with wound 01/04/23 She saw her surgery today -- "it looks good" per patient. Patient is released to go back to work end of this month She is currently taking augmentin No diarrhea, nausea Eating good and appetite is good    ---------- 12/22/22 clinic f/u Doing well on amox Mild tenderness Needs paperwork for work filled out No pus Not packing any more No n/v/diarrhea with amox    ------------ 02/03/23 id clinic f/u See a/p for detail   No Known Allergies    Outpatient Medications Prior to Visit  Medication Sig Dispense Refill    allopurinol (ZYLOPRIM) 100 MG tablet Take 200 mg by mouth daily.     amoxicillin-clavulanate (AUGMENTIN) 875-125 MG tablet TAKE 1 TABLET BY MOUTH TWICE DAILY. START TAKING 11/12/22 60 tablet 1   acetaminophen (TYLENOL) 325 MG tablet Take 2 tablets (650 mg total) by mouth every 6 (six) hours as needed for moderate pain. (Patient not taking: Reported on 12/22/2022)     docusate sodium (COLACE) 100 MG capsule Take 1 capsule (100 mg total) by mouth 2 (two) times daily as needed for mild constipation or moderate constipation. (Patient not taking: Reported on 11/23/2022)     metoprolol tartrate (LOPRESSOR) 25 MG tablet Take 0.5 tablets (12.5 mg total) by mouth 2 (two) times daily. (Patient not taking: Reported on 12/22/2022) 60 tablet 0   MITIGARE 0.6 MG CAPS Take 0.12 mg by mouth daily. (Patient not taking: Reported on 02/03/2023)     norethindrone (MICRONOR,CAMILA,ERRIN) 0.35 MG tablet Take 1 tablet by mouth at bedtime. (Patient not taking: Reported on 11/23/2022)     polyethylene glycol (MIRALAX / GLYCOLAX) packet Take 17 g by mouth daily as needed for mild constipation. (Patient not taking: Reported on 10/27/2022) 14 each 0   potassium chloride (KLOR-CON) 10 MEQ tablet Take 1 tablet (10 mEq total) by mouth daily. (Patient not taking: Reported on 12/22/2022) 5 tablet 0   spironolactone (ALDACTONE)  25 MG tablet Take 1 tablet (25 mg total) by mouth daily. (Patient not taking: Reported on 10/27/2022)     torsemide (DEMADEX) 100 MG tablet Take 100 mg by mouth daily. (Patient not taking: Reported on 02/03/2023)     No facility-administered medications prior to visit.     Social History   Socioeconomic History   Marital status: Divorced    Spouse name: Not on file   Number of children: Not on file   Years of education: Not on file   Highest education level: Not on file  Occupational History   Not on file  Tobacco Use   Smoking status: Some Days    Types: E-cigarettes   Smokeless tobacco: Never   Tobacco  comments:    Vapes sometimes   Vaping Use   Vaping status: Never Used  Substance and Sexual Activity   Alcohol use: Not Currently    Comment: rare   Drug use: No   Sexual activity: Not on file  Other Topics Concern   Not on file  Social History Narrative   Not on file   Social Determinants of Health   Financial Resource Strain: Not on file  Food Insecurity: No Food Insecurity (10/27/2022)   Hunger Vital Sign    Worried About Running Out of Food in the Last Year: Never true    Ran Out of Food in the Last Year: Never true  Transportation Needs: No Transportation Needs (10/27/2022)   PRAPARE - Administrator, Civil Service (Medical): No    Lack of Transportation (Non-Medical): No  Physical Activity: Not on file  Stress: Not on file  Social Connections: Unknown (11/14/2021)   Received from [facility]   Social Network    Social Network: Not on file  Intimate Partner Violence: Not At Risk (10/27/2022)   Humiliation, Afraid, Rape, and Kick questionnaire    Fear of Current or Ex-Partner: No    Emotionally Abused: No    Physically Abused: No    Sexually Abused: No      Review of Systems    All other ros negative  Objective:    BP 118/80   Pulse 78   Temp 98 F (36.7 C) (Oral)   Resp 16   SpO2 98%  Nursing note and vital signs reviewed.  Physical Exam     General/constitutional: no distress, pleasant HEENT: Normocephalic, PER, Conj Clear, EOMI, Oropharynx clear Neck supple CV: rrr no mrg Lungs: clear to auscultation, normal respiratory effort Abd: Soft, Nontender Ext: no edema Skin: much improved wound, appeared to close on the inside already; clean; no discharge; no fluctuance; minimal tenderness   7/25   Labs: Lab Results  Component Value Date   WBC 15.9 (H) 11/04/2022   HGB 10.7 (L) 11/04/2022   HCT 32.5 (L) 11/04/2022   MCV 91.0 11/04/2022   PLT 396 11/04/2022   Last metabolic panel Lab Results  Component Value Date   GLUCOSE 104  (H) 11/04/2022   NA 144 11/04/2022   K 3.4 (L) 11/04/2022   CL 103 11/04/2022   CO2 30 11/04/2022   BUN 19 11/04/2022   CREATININE 1.72 (H) 11/04/2022   GFRNONAA 36 (L) 11/04/2022   CALCIUM 7.9 (L) 11/04/2022   PHOS 4.0 09/30/2016   PROT 7.5 03/24/2021   ALBUMIN 4.5 03/24/2021   LABGLOB 3.1 09/24/2016   AGRATIO 0.9 09/24/2016   BILITOT 1.2 03/24/2021   ALKPHOS 97 03/24/2021   AST 87 (H) 03/24/2021   ALT 100 (  H) 03/24/2021   ANIONGAP 11 11/04/2022    Micro:  Serology:  Imaging:  Assessment & Plan:   Problem List Items Addressed This Visit       Digestive   Perirectal abscess - Primary   Relevant Orders   CBC with Differential/Platelet   COMPLETE METABOLIC PANEL WITH GFR   C-reactive protein     No orders of the defined types were placed in this encounter.    52 yo female admitted with 1 week perianal pain found to have perirectal abscess    4/17 s/p I&D of perirectal abscess; cx actinomyces species, and non-esbl ecoli (resistant to amp-sulb; S piptazo, ceftriaxone)   Given the presence of actino, she might benefit from prolonged treatment of up to 3 months, now that she had I&D. No classic actino syndrome so unclear if truly need but would stay on long course and see if can deescalate early   Plan 2 more weeks iv abx and then 3 months oral abx  -------- 11/23/22 id assessment Patient clinically improving She had transitioned from initial 4 weeks iv abx to po amox-clav No abx side effect  Will see her again in 4-6 weeks with repeat blood tests. 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      Raymondo Band, MD [facility] 02/03/2023, 5:02 PM

## 2023-02-04 ENCOUNTER — Telehealth: Payer: Self-pay

## 2023-02-04 NOTE — Telephone Encounter (Signed)
Patient called regarding her work accommodation form stating the form need to say she can work from home.  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   Per Dr. Renold Don he is ok with me adding limit patient to work from home until 03/11/23. I have added this to her form and emailed it to the patient. She was also advised that Dr. Renold Don will not return to the office until 02/08/23, so if her job will not accept me adding updated information to the form he can fix it when he returns. Patient verbalized understanding. A copy of the form placed in scan and triage. Gavon Majano T Pricilla Loveless

## 2023-02-08 ENCOUNTER — Ambulatory Visit: Payer: Managed Care, Other (non HMO) | Admitting: Internal Medicine
